# Patient Record
Sex: Female | Born: 1935 | Race: White | Hispanic: No | Smoking: Never smoker
Health system: Southern US, Community
[De-identification: ages and names within clinical notes are randomized; demographics above are authoritative.]

## PROBLEM LIST (undated history)

## (undated) DIAGNOSIS — I10 Essential (primary) hypertension: Secondary | ICD-10-CM

## (undated) DIAGNOSIS — E785 Hyperlipidemia, unspecified: Secondary | ICD-10-CM

## (undated) DIAGNOSIS — I2510 Atherosclerotic heart disease of native coronary artery without angina pectoris: Secondary | ICD-10-CM

## (undated) DIAGNOSIS — I480 Paroxysmal atrial fibrillation: Secondary | ICD-10-CM

## (undated) DIAGNOSIS — E039 Hypothyroidism, unspecified: Secondary | ICD-10-CM

## (undated) DIAGNOSIS — K219 Gastro-esophageal reflux disease without esophagitis: Secondary | ICD-10-CM

## (undated) DIAGNOSIS — Z9289 Personal history of other medical treatment: Secondary | ICD-10-CM

## (undated) DIAGNOSIS — K589 Irritable bowel syndrome without diarrhea: Secondary | ICD-10-CM

## (undated) DIAGNOSIS — K5790 Diverticulosis of intestine, part unspecified, without perforation or abscess without bleeding: Secondary | ICD-10-CM

## (undated) DIAGNOSIS — R0989 Other specified symptoms and signs involving the circulatory and respiratory systems: Secondary | ICD-10-CM

## (undated) HISTORY — DX: Essential (primary) hypertension: I10

## (undated) HISTORY — PX: BACK SURGERY: SHX140

## (undated) HISTORY — DX: Personal history of other medical treatment: Z92.89

## (undated) HISTORY — DX: Irritable bowel syndrome, unspecified: K58.9

## (undated) HISTORY — DX: Hyperlipidemia, unspecified: E78.5

## (undated) HISTORY — DX: Diverticulosis of intestine, part unspecified, without perforation or abscess without bleeding: K57.90

## (undated) HISTORY — PX: OTHER SURGICAL HISTORY: SHX169

## (undated) HISTORY — PX: RECTOCELE REPAIR: SHX761

## (undated) HISTORY — DX: Gastro-esophageal reflux disease without esophagitis: K21.9

## (undated) HISTORY — PX: CORONARY ANGIOPLASTY WITH STENT PLACEMENT: SHX49

## (undated) HISTORY — DX: Atherosclerotic heart disease of native coronary artery without angina pectoris: I25.10

## (undated) HISTORY — DX: Other specified symptoms and signs involving the circulatory and respiratory systems: R09.89

## (undated) HISTORY — DX: Hypothyroidism, unspecified: E03.9

---

## 1997-03-31 ENCOUNTER — Encounter: Payer: Self-pay | Admitting: Internal Medicine

## 1998-06-16 ENCOUNTER — Other Ambulatory Visit: Admission: RE | Admit: 1998-06-16 | Discharge: 1998-06-16 | Payer: Self-pay | Admitting: Obstetrics and Gynecology

## 2000-05-12 ENCOUNTER — Encounter: Admission: RE | Admit: 2000-05-12 | Discharge: 2000-08-10 | Payer: Self-pay | Admitting: Family Medicine

## 2000-05-21 ENCOUNTER — Encounter: Payer: Self-pay | Admitting: Family Medicine

## 2000-05-21 ENCOUNTER — Ambulatory Visit (HOSPITAL_COMMUNITY): Admission: RE | Admit: 2000-05-21 | Discharge: 2000-05-21 | Payer: Self-pay | Admitting: Family Medicine

## 2000-06-01 ENCOUNTER — Encounter: Payer: Self-pay | Admitting: Neurosurgery

## 2000-06-05 ENCOUNTER — Inpatient Hospital Stay (HOSPITAL_COMMUNITY): Admission: RE | Admit: 2000-06-05 | Discharge: 2000-06-06 | Payer: Self-pay | Admitting: Neurosurgery

## 2000-06-05 ENCOUNTER — Encounter: Payer: Self-pay | Admitting: Neurosurgery

## 2000-06-27 ENCOUNTER — Encounter: Admission: RE | Admit: 2000-06-27 | Discharge: 2000-06-27 | Payer: Self-pay | Admitting: Neurosurgery

## 2000-06-27 ENCOUNTER — Encounter: Payer: Self-pay | Admitting: Neurosurgery

## 2001-03-15 ENCOUNTER — Encounter (INDEPENDENT_AMBULATORY_CARE_PROVIDER_SITE_OTHER): Payer: Self-pay | Admitting: *Deleted

## 2001-03-15 ENCOUNTER — Encounter (INDEPENDENT_AMBULATORY_CARE_PROVIDER_SITE_OTHER): Payer: Self-pay | Admitting: Specialist

## 2001-03-15 ENCOUNTER — Observation Stay (HOSPITAL_COMMUNITY): Admission: RE | Admit: 2001-03-15 | Discharge: 2001-03-16 | Payer: Self-pay | Admitting: Obstetrics and Gynecology

## 2002-08-08 ENCOUNTER — Other Ambulatory Visit: Admission: RE | Admit: 2002-08-08 | Discharge: 2002-08-08 | Payer: Self-pay | Admitting: Obstetrics and Gynecology

## 2006-11-30 ENCOUNTER — Encounter (INDEPENDENT_AMBULATORY_CARE_PROVIDER_SITE_OTHER): Payer: Self-pay | Admitting: *Deleted

## 2006-11-30 ENCOUNTER — Ambulatory Visit: Payer: Self-pay | Admitting: Cardiology

## 2006-11-30 ENCOUNTER — Inpatient Hospital Stay (HOSPITAL_COMMUNITY): Admission: EM | Admit: 2006-11-30 | Discharge: 2006-12-01 | Payer: Self-pay | Admitting: Emergency Medicine

## 2006-12-01 ENCOUNTER — Encounter (INDEPENDENT_AMBULATORY_CARE_PROVIDER_SITE_OTHER): Payer: Self-pay | Admitting: *Deleted

## 2006-12-26 ENCOUNTER — Encounter (INDEPENDENT_AMBULATORY_CARE_PROVIDER_SITE_OTHER): Payer: Self-pay | Admitting: *Deleted

## 2007-08-07 ENCOUNTER — Ambulatory Visit: Payer: Self-pay | Admitting: Cardiology

## 2007-08-07 ENCOUNTER — Telehealth: Payer: Self-pay | Admitting: Internal Medicine

## 2007-08-07 ENCOUNTER — Ambulatory Visit: Payer: Self-pay | Admitting: Internal Medicine

## 2007-08-07 ENCOUNTER — Inpatient Hospital Stay (HOSPITAL_COMMUNITY): Admission: AD | Admit: 2007-08-07 | Discharge: 2007-08-09 | Payer: Self-pay | Admitting: Internal Medicine

## 2007-08-07 ENCOUNTER — Inpatient Hospital Stay (HOSPITAL_BASED_OUTPATIENT_CLINIC_OR_DEPARTMENT_OTHER): Admission: RE | Admit: 2007-08-07 | Discharge: 2007-08-07 | Payer: Self-pay | Admitting: Internal Medicine

## 2007-08-28 ENCOUNTER — Ambulatory Visit: Payer: Self-pay | Admitting: Internal Medicine

## 2007-08-29 ENCOUNTER — Ambulatory Visit: Payer: Self-pay | Admitting: Internal Medicine

## 2007-08-29 LAB — CONVERTED CEMR LAB
ALT: 15 units/L (ref 0–35)
AST: 26 units/L (ref 0–37)
Albumin: 4 g/dL (ref 3.5–5.2)
Alkaline Phosphatase: 108 units/L (ref 39–117)
BUN: 6 mg/dL (ref 6–23)
Bilirubin, Direct: 0.1 mg/dL (ref 0.0–0.3)
CO2: 30 meq/L (ref 19–32)
Calcium: 9.6 mg/dL (ref 8.4–10.5)
Chloride: 99 meq/L (ref 96–112)
Cholesterol: 202 mg/dL (ref 0–200)
Creatinine, Ser: 0.9 mg/dL (ref 0.4–1.2)
Direct LDL: 131.9 mg/dL
GFR calc Af Amer: 77 mL/min
GFR calc non Af Amer: 63 mL/min
Glucose, Bld: 98 mg/dL (ref 70–99)
HDL: 38.5 mg/dL — ABNORMAL LOW (ref >39.0)
Potassium: 4.6 meq/L (ref 3.5–5.1)
Sodium: 134 meq/L — ABNORMAL LOW (ref 135–145)
Total Bilirubin: 0.8 mg/dL (ref 0.3–1.2)
Total CHOL/HDL Ratio: 5.2
Total Protein: 7.8 g/dL (ref 6.0–8.3)
Triglycerides: 168 mg/dL — ABNORMAL HIGH (ref 0–149)
VLDL: 34 mg/dL (ref 0–40)

## 2007-09-03 ENCOUNTER — Encounter (INDEPENDENT_AMBULATORY_CARE_PROVIDER_SITE_OTHER): Payer: Self-pay | Admitting: *Deleted

## 2007-12-13 ENCOUNTER — Ambulatory Visit: Payer: Self-pay | Admitting: Internal Medicine

## 2007-12-13 LAB — CONVERTED CEMR LAB
ALT: 18 units/L (ref 0–35)
AST: 22 units/L (ref 0–37)
Albumin: 3.7 g/dL (ref 3.5–5.2)
Alkaline Phosphatase: 80 units/L (ref 39–117)
BUN: 8 mg/dL (ref 6–23)
Bilirubin, Direct: 0.1 mg/dL (ref 0.0–0.3)
CO2: 31 meq/L (ref 19–32)
Calcium: 9.1 mg/dL (ref 8.4–10.5)
Chloride: 97 meq/L (ref 96–112)
Cholesterol: 114 mg/dL (ref 0–200)
Creatinine, Ser: 0.7 mg/dL (ref 0.4–1.2)
GFR calc Af Amer: 102 mL/min
GFR calc non Af Amer: 85 mL/min
Glucose, Bld: 81 mg/dL (ref 70–99)
HDL: 38.6 mg/dL — ABNORMAL LOW (ref >39.0)
LDL Cholesterol: 46 mg/dL (ref 0–99)
Potassium: 4.2 meq/L (ref 3.5–5.1)
Sodium: 134 meq/L — ABNORMAL LOW (ref 135–145)
Total Bilirubin: 0.8 mg/dL (ref 0.3–1.2)
Total CHOL/HDL Ratio: 3
Total Protein: 6.9 g/dL (ref 6.0–8.3)
Triglycerides: 148 mg/dL (ref 0–149)
VLDL: 30 mg/dL (ref 0–40)

## 2007-12-20 ENCOUNTER — Ambulatory Visit: Payer: Self-pay | Admitting: Internal Medicine

## 2008-01-03 ENCOUNTER — Ambulatory Visit: Payer: Self-pay

## 2008-01-03 ENCOUNTER — Encounter: Payer: Self-pay | Admitting: Internal Medicine

## 2008-07-04 ENCOUNTER — Encounter: Payer: Self-pay | Admitting: Internal Medicine

## 2008-07-04 ENCOUNTER — Ambulatory Visit: Payer: Self-pay | Admitting: Internal Medicine

## 2008-07-04 DIAGNOSIS — E785 Hyperlipidemia, unspecified: Secondary | ICD-10-CM | POA: Insufficient documentation

## 2008-07-04 DIAGNOSIS — I2510 Atherosclerotic heart disease of native coronary artery without angina pectoris: Secondary | ICD-10-CM | POA: Insufficient documentation

## 2008-07-04 DIAGNOSIS — I10 Essential (primary) hypertension: Secondary | ICD-10-CM | POA: Insufficient documentation

## 2008-07-04 DIAGNOSIS — K589 Irritable bowel syndrome without diarrhea: Secondary | ICD-10-CM | POA: Insufficient documentation

## 2008-08-11 ENCOUNTER — Encounter: Payer: Self-pay | Admitting: Internal Medicine

## 2008-08-29 DIAGNOSIS — K5730 Diverticulosis of large intestine without perforation or abscess without bleeding: Secondary | ICD-10-CM | POA: Insufficient documentation

## 2008-08-29 DIAGNOSIS — K219 Gastro-esophageal reflux disease without esophagitis: Secondary | ICD-10-CM | POA: Insufficient documentation

## 2008-08-29 DIAGNOSIS — E039 Hypothyroidism, unspecified: Secondary | ICD-10-CM | POA: Insufficient documentation

## 2008-08-29 DIAGNOSIS — K649 Unspecified hemorrhoids: Secondary | ICD-10-CM | POA: Insufficient documentation

## 2008-09-03 ENCOUNTER — Ambulatory Visit: Payer: Self-pay | Admitting: Internal Medicine

## 2008-09-12 ENCOUNTER — Ambulatory Visit: Payer: Self-pay | Admitting: Internal Medicine

## 2009-02-05 ENCOUNTER — Encounter: Payer: Self-pay | Admitting: Internal Medicine

## 2009-03-19 ENCOUNTER — Ambulatory Visit: Payer: Self-pay | Admitting: Internal Medicine

## 2009-03-19 DIAGNOSIS — R0989 Other specified symptoms and signs involving the circulatory and respiratory systems: Secondary | ICD-10-CM | POA: Insufficient documentation

## 2009-08-10 ENCOUNTER — Encounter: Payer: Self-pay | Admitting: Internal Medicine

## 2010-02-10 ENCOUNTER — Ambulatory Visit: Payer: Self-pay | Admitting: Internal Medicine

## 2010-02-10 ENCOUNTER — Inpatient Hospital Stay (HOSPITAL_COMMUNITY): Admission: EM | Admit: 2010-02-10 | Discharge: 2010-02-12 | Payer: Self-pay | Admitting: Emergency Medicine

## 2010-02-18 ENCOUNTER — Encounter: Payer: Self-pay | Admitting: Internal Medicine

## 2010-02-26 ENCOUNTER — Encounter: Payer: Self-pay | Admitting: Internal Medicine

## 2010-03-01 ENCOUNTER — Ambulatory Visit: Payer: Self-pay | Admitting: Internal Medicine

## 2010-03-24 ENCOUNTER — Encounter: Payer: Self-pay | Admitting: Internal Medicine

## 2010-05-13 NOTE — Progress Notes (Signed)
Summary: CHEST PAIN//TRIAGE  Phone Note Call from Patient Call back at Home Phone [phone]   Caller: Patient DAUGHTER IN LAW [name] Call For: Aikam Vinje Reason for Call: Talk to Nurse Complaint: Chest Pain Summary of Call: PT WAKES UP IN THE MIDDLE OF NIGHT W/ CHEST PAINS...UNABLE TO SLEEP..A LOT OF INDIGESTION HAD A CARDIAC WORK UP AND ITS NOT HEART RELATED.Marland KitchenHAS WEIGHT LOSS./PT WANT TO SPEAK TO NURSE LAS TIME SHE SAW Aitanna Haubner WAS IN '98 IN [facility] ORDERED CHART Initial call taken by: Tawni Levy,  August 07, 2007 9:41 AM  Follow-up for Phone Call        Pt. c/o mid-chest pain 2-3 times a week. Worse at night, but now having symptoms upon exertion. Also c/o SOB, sweating,tingling and numbness down both arms and nausea during these episodes. Takes Protonix at bedtime and as needed. Was in hospital 11/2006 and didn't keep f/u appt. for stress test and ov in 9/08. Pt. states she doesn't have a cardiologist. I called [facility] and got pt. an appointment today at 11:30am with Dr.Hochrein. Pt. instructed to take her meds and insurance cards with her. Pt. instructed to call back as needed. ..................................................................Marland KitchenLaureen Ochs LPN  August 07, 2007 10:34 AM

## 2010-05-13 NOTE — Procedures (Signed)
Summary: Gastroenterology-COLON  Gastroenterology-COLON   Imported By: Hortense Ramal CMA 08/29/2008 17:40:42  _____________________________________________________________________  External Attachment:    Type:   Image     Comment:   External Document

## 2010-05-13 NOTE — Op Note (Signed)
Summary: Rectocele/Enteroocele Repair                    [facility]  Patient:    [name], [name] Visit Number: [id number] MRN: [id number]          Service Type: DSU Location: [id number] Attending Physician:  Miguel Aschoff Dictated by:   Miguel Aschoff, M.D. Proc. Date: 03/15/01 Admit Date:  03/15/2001                             Operative Report  PREOPERATIVE DIAGNOSIS:       Symptomatic rectocele and enterocele.  POSTOPERATIVE DIAGNOSIS:      Symptomatic rectocele and enterocele.  OPERATION:                    Repair of rectocele and enterocele.  SURGEON:                      Miguel Aschoff, M.D.  ASSISTANT:                    Brook A. Edward Jolly, M.D.  ANESTHESIA:                   General anesthesia.  COMPLICATIONS:                None.  ESTIMATED BLOOD LOSS:         70 cc.  INDICATIONS:                  The patient is a 77 year old white female status post abdominal hysterectomy in 1974 and four normal spontaneous vaginal deliveries.  The patient reports that on standing and straining, she notes protrusion through the introitus and on examination she is noted to have a mass bulging through the introitus which is consistent with a large rectocele above which exists also an enterocele. She presents now to have these defects in the pelvic floor repaired.  The risks and benefits of the procedure have been discussed with the patient.  DESCRIPTION OF PROCEDURE:     The patient was taken to the operating room and placed in the supine position and general anesthesia was administered without difficulty.  She was then placed in dorsal lithotomy position and prepped and draped in the usual sterile fashion.  The posterior vaginal wall and perineum were injected with dilute Neo-Synephrine solution.  Ellipse of skin was then incised in the perineum and elevated and then dissection of the posterior vaginal mucosa was carried up to the apex of the vaginal cuff.  At the  vaginal cuff, the remnants of the uterosacral ligaments were identified and tagged using figure-of-eight sutures of 0 Vicryl. These were then elevated.  Then the vaginal mucosa was separated from the perirectal tissue, then the large rectocele was identified and dissected out.  At the apex of the cuff, the enterocele was identified.  Peritoneum was then entered. The enterocele was reduced using pursestring suture of 0 Vicryl and the excess tissue from the enterocele sac was excised.  The rectocele was then reduced using serial pursestring sutures of 0 Vicryl suture.  Once it was reduced, it was reinforced using interrupted 0 Vicryl sutures.  The perirectal fascia was brought across the defect using interrupted 0 Vicryl sutures.  Once this was done, the levator muscles were brought together using interrupted 0 Vicryl sutures.  The excess vaginal mucosa  was trimmed. The uterosacral ligaments were then sutured to each other at the apex to support the cuff and the vaginal mucosa was closed using running interlocking 0 Vicryl suture.  The peritoneum was then reapproximated using running continuous 2-0 Vicryl suture and the perineal skin was closed using subcuticular 2-0 Vicryl suture. The estimated blood loss was approximately 70 cc.  The patient tolerated the procedure well and was taken to the recovery room in satisfactory condition. A vaginal pack of iodoform gauze was placed in the vagina prior to completion of the procedure as was the Foley catheter which drained clear urine. Dictated by:   Miguel Aschoff, M.D. Attending Physician:  Miguel Aschoff DD:  03/15/01 TD:  03/15/01 Job: [id number]

## 2010-05-13 NOTE — Assessment & Plan Note (Signed)
Summary: STOMACH PAIN AND DIARRHEA..EM    History of Present Illness Visit Type: Initial Visit Primary GI MD: Lina Sar MD Primary Provider: Juline Patch, MD Chief Complaint: diarrhea History of Present Illness:   This is an 77 year old white female with fecal urgency and incontinence which occurs several times a week. She has a history of moderate to severe diverticulosis of the left colon confirmed on a colonoscopy done in December 1998. She also had a rectocele and cystocele repair by Dr. Duane Lope in 2000. She had a contracted gallbladder on an ultrasound in August 2008 with her common bile duct measuring at 7 mm. The gallbladder was actually never visualized but was presumed to be contracted. She has many cardiovascular problems including high blood pressure and carotid bruits. She has hyperlipidemia and coronary artery disease for which she is status post PTCA in April 2009. She is on Plavix.   GI Review of Systems    Reports abdominal pain.     Location of  Abdominal pain: lower abdomen.    Denies acid reflux, belching, bloating, chest pain, dysphagia with liquids, dysphagia with solids, heartburn, loss of appetite, nausea, vomiting, vomiting blood, weight loss, and  weight gain.      Reports diarrhea, diverticulosis, and  hemorrhoids.     Denies anal fissure, black tarry stools, change in bowel habit, constipation, fecal incontinence, heme positive stool, irritable bowel syndrome, jaundice, light color stool, liver problems, rectal bleeding, and  rectal pain.    Current Medications (verified): 1)  Multivitamins   Tabs (Multiple Vitamin) .... Once Daily 2)  Vitamin B-12 500 Mcg  Tabs (Cyanocobalamin) .Marland Kitchen.. 1 Once Daily 3)  Vitamin C 500 Mg  Tabs (Ascorbic Acid) .Marland Kitchen.. 1 Once Daily 4)  Calcium 600/vitamin D 600-400 Mg-Unit Tabs (Calcium Carbonate-Vitamin D) .Marland Kitchen.. 1 Once Daily 5)  Vitamin E 600 Unit  Caps (Vitamin E) .Marland Kitchen.. 1 Once Daily 6)  Prevacid 15 Mg Cpdr (Lansoprazole) .Marland Kitchen.. 1 Once  Daily 7)  Levoxyl 100 Mcg Tabs (Levothyroxine Sodium) .Marland Kitchen.. 1 Once Daily 8)  Amlodipine Besylate 5 Mg Tabs (Amlodipine Besylate) .... Take One Tablet By Mouth Daily 9)  Simvastatin 40 Mg Tabs (Simvastatin) .... Take One Tablet By Mouth Daily At Bedtime 10)  Metoprolol Tartrate 100 Mg Tabs (Metoprolol Tartrate) .... Take One Tablet By Mouth Twice A Day 11)  Aspirin 81 Mg Tbec (Aspirin) .... Take One Tablet By Mouth Daily 12)  Plavix 75 Mg Tabs (Clopidogrel Bisulfate) .... Take One Tablet By Mouth Daily 13)  Align  Caps (Misc Intestinal Flora Regulat) .... Once Daily  Allergies (verified): No Known Drug Allergies  Past History:  Past Medical History: Reviewed history from 08/29/2008 and no changes required.  1. CAD      a, s/p Promus DES to LAD 4/09. moderate CAD elsewhere. EF 75%  2. Severe hypertension.   2. Hypothyroidism.   3. Diverticulosis with history of diverticulitis.   4. Irritable bowel  DIVERTICULOSIS, COLON (ICD-562.10) HEMORRHOIDS (ICD-455.6) HYPOTHYROIDISM (ICD-244.9) GERD (ICD-530.81) IRRITABLE BOWEL SYNDROME (ICD-564.1) HYPERLIPIDEMIA (ICD-272.4) HYPERTENSION, BENIGN (ICD-401.1) CAD, NATIVE VESSEL (ICD-414.01)       Past Surgical History: Reviewed history from 08/29/2008 and no changes required. Back Surgery Hysterectomy Rectocele/Enterocele Repair  Family History: Reviewed history from 08/29/2008 and no changes required. Her mother died at age 65 of complications of myasthenia gravis.  Her father died at age 9 of complications from a hip fracture from a fall. No FH of Colon Cancer: Family History of Heart Disease: Uncles  Social History:  Reviewed history from 08/29/2008 and no changes required. She is widowed with 4 children.   Occupation: Retired Engineer, site Alcohol Use - no Illicit Drug Use - no Patient does not get regular exercise.   Review of Systems       Pertinent positive and negative review of systems were noted in the above HPI.  All other ROS was otherwise negative.   Vital Signs:  Patient profile:   77 year old female Height:      65 inches Weight:      148 pounds Pulse rate:   64 / minute Pulse rhythm:   regular BP sitting:   142 / 76  (left arm) Cuff size:   regular  Vitals Entered By: June McMurray CMA (Sep 03, 2008 1:26 PM)  Physical Exam  General:  Appears younger than her stated age. Eyes:  PERRLA, no icterus. Mouth:  No deformity or lesions, dentition normal. Lungs:  Clear throughout to auscultation. Heart:  Regular rate and rhythm; no murmurs, rubs or bruits. Abdomen:  soft nontender with normoactive bowel sounds. No distention. No bruit. Liver edge at costal margin. There is no palpable mass. Rectal:  normal rectal tone with decreased  squeeze. There is a small amount of hemoccult negative stool in the rectal ampulla. Neurologic:  Alert and oriented x4; grossly normal neurologically.   Impression & Recommendations:  Problem # 1:  DIVERTICULOSIS, COLON (ICD-562.10)  moderately severe diverticulosis of the left colon most likely causing patient's symptoms of urgency due to partial narrowing of the lumen. Her last colonoscopy was in 1998. At this time, we need to rule out any obstructing lesion. She had repair of a rectocele and cystocele with possible recurrence . She also has a decreased rectal tone causing patient's urgency and accidents. We will try her on fiber supplements including Metamucil 2 teaspoons daily and Levbid 0.375 mg q.a.m. as an antispasmodic. She takes Imodium p.r.n. We will proceed with a colonoscopy for further evaluation of the left colon.  Orders: Colonoscopy (Colon)  Problem # 2:  HEMORRHOIDS (ICD-455.6)  There were no noticeable hemorrhoids on today's exam.  Orders: Colonoscopy (Colon)  Problem # 3:  IRRITABLE BOWEL SYNDROME (ICD-564.1)  fecal urgency and incontinence may be of partly related to irritable bowel syndrome. She will undergo evaluation with a  colonoscopy.  Orders: Colonoscopy (Colon)  Patient Instructions: 1)  Metamucil 2 teaspoons p.o. q.d. 2)  Schedule colonoscopy on Plavix.  3)  Patient is due to see Dr. Duane Lope 4)  Copy sent to : Dr R.Pang Prescriptions: LEVBID 0.375 MG XR12H-TAB (HYOSCYAMINE SULFATE) Take 1 tablet by mouth every morning.  #30 x 1   Entered by:   Hortense Ramal CMA   Authorized by:   Hart Carwin   Signed by:   Hortense Ramal CMA on 09/03/2008   Method used:   Electronically to        [facility] [phone]* (retail)       [address] [place]  [address]       Ph: [phone] or [phone]       Fax: [phone]   RxID:   [id number] DULCOLAX 5 MG  TBEC (BISACODYL) Day before procedure take 2 at 3pm and 2 at 8pm.  #4 x 0   Entered by:   Hortense Ramal CMA   Authorized by:   Hart Carwin   Signed by:   Hortense Ramal CMA on 09/03/2008  Method used:   [facility]ronically to        [facility] [phone]* (retail)       [address] [place]  [address]       Ph: [phone] or [phone]       Fax: [phone]   RxID:   [id number] REGLAN 10 MG  TABS (METOCLOPRAMIDE HCL) As per prep instructions.  #2 x 0   Entered by:   Hortense Ramal CMA   Authorized by:   Hart Carwin   Signed by:   Hortense Ramal CMA on 09/03/2008   Method used:   Electronically to        [facility] [phone]* (retail)       [address] [place]  [address]       Ph: [phone] or [phone]       Fax: [phone]   RxID:   [id number] MIRALAX   POWD (POLYETHYLENE GLYCOL 3350) As per prep  instructions.  #255 grams x 0   Entered by:   Hortense Ramal CMA   Authorized by:   Hart Carwin   Signed by:   Hortense Ramal CMA on 09/03/2008   Method used:   Electronically to        [facility] [phone]* (retail)       [address]       [place], [place]  [address]       Ph: [phone] or [phone]       Fax: [phone]   RxID:   [id number]

## 2010-05-13 NOTE — Letter (Signed)
Summary: Cardiac Rehab Program  Cardiac Rehab Program   Imported By: Marylou Mccoy 03/17/2010 11:34:49  _____________________________________________________________________  External Attachment:    Type:   Image     Comment:   External Document

## 2010-05-13 NOTE — Discharge Summary (Signed)
Summary: Chest Pain  NAME:  [name], [name]                ACCOUNT NO.:  [id number]      MEDICAL RECORD NO.:  [id number]          PATIENT TYPE:  INP      LOCATION:  6526                         FACILITY:  [facility]      PHYSICIAN:  Arturo Morton. Riley Kill, MD, FACCDATE OF BIRTH:  1921-03-09      DATE OF ADMISSION:  08/07/2007   DATE OF DISCHARGE:  08/09/2007                                  DISCHARGE SUMMARY      PRIMARY CARDIOLOGIST:  [name]. Bensimhon, MD      PRIMARY CARE PHYSICIAN:  Not listed.      GASTROENTEROLOGIST:  Hedwig Morton. Juanda Chance, MD.      PROCEDURES PERFORMED DURING HOSPITALIZATION:  Cardiac catheterization.   A.  Three-vessel coronary artery disease with high-grade thrombotic   lesion in the left anterior descending artery, otherwise nonobstructive   coronary artery disease, hyperdynamic left ventricular function.   B.  Status post percutaneous coronary intervention to the left anterior   descending using a 2.5- x 80-mm Palmaz drug-eluting stent per Dr.   Riley Kill on August 07, 2007.      FINAL DISCHARGE DIAGNOSES:   1. Coronary artery disease.   1A.  Status post cardiac catheterization with percutaneous coronary   intervention to the left anterior descending using a Palmaz drug-eluting   stent with other nonobstructive disease noted and found ruptured plaque   in the mid left anterior descending with a septal occlusion.   1. Hypertension.   2. Gastroesophageal reflux disease.   3. Irritable bowel syndrome.   4. Hypothyroidism.      HOSPITAL COURSE:  This is an 77 year old female with a history of   hypertension and GERD with complaints of chest discomfort, which she   also had associated shortness of breath.  The pain was substernal   lasting for about 10-15 minutes and resolved with rest.  She also   reported some nighttime episodes, as a result of this the patient was   admitted for cardiac catheterization.      The patient did undergo cardiac catheterization as described  above and   did have PCI to the mid LAD with a drug-eluting stent by Dr. Riley Kill.   She also had moderate coronary artery disease in the other vessels and   will be treated medically for that.  The patient tolerated the procedure   well without any further complaints of chest discomfort.      Labs were found to be stable.  There were no elevations to cardiac   enzymes.  The patient was placed on Pepcid along with Plavix on   discharge and aspirin.  The patient will follow up with Dr. Gala Romney in   the office as an outpatient for continued cardiac care.      DISCHARGE LABORATORY:  Bilirubin 0.8, direct bilirubin 0.1, alkaline   phosphatase 108, SGOT 26, total protein 7.8, albumin 4.0, ALT 15, sodium   134, potassium 4.6, chloride 99, CO2 of 30, glucose 98, BUN 6,   creatinine 0.9, cholesterol 202, lipids 168, LDL 38.5,  hemoglobin 12.1,   hematocrit 35.8, white blood cells 8.1, platelets 217,000.      DISCHARGE MEDICATIONS:   1. Pepcid 20 mg p.o. daily.   2. Synthroid 100 mcg daily.   3. Metoprolol 100 mg b.i.d.,   4. Amlodipine 5 mg daily.   5. Plavix 75 mg daily.   6. Aspirin 325 mg daily.      ALLERGIES:  No known drug allergies.      FOLLOWUP PLANS AND APPOINTMENTS:   1. The patient will follow up with Dr. Arvilla Meres on Aug 28, 2007 for continuation of cardiac evaluation.   2. The patient will follow up with her primary care physician for       continuation of medical management.   3. The patient was given post cardiac catheterization instructions       with particular evidences on the right groin site for evidence of       bleeding hematoma or signs of infection.   4. The patient has been advised to bring medications to followup       appointment.      Time spent with the patient to include physician time 35 minutes.               Bettey Mare. Lyman Bishop, NP               Arturo Morton. Riley Kill, MD, [facility]   Electronically Signed         KML/MEDQ  D:   09/03/2007  T:  09/03/2007  Job:  [id number]      cc:   Juline Patch, M.D.

## 2010-05-13 NOTE — Assessment & Plan Note (Signed)
Summary: [facility]   Visit Type:  Follow-up Primary Provider:  Ricki Miller  CC:  shortness of breath.  History of Present Illness: 77 y/o with h/o CAD with history of Promus DES to LAD, HTN  and IBS.  Doing well from cardiac perspective. No CP. Does have mild dyspnea. Gets out of breath with moderate activity. Still bowling 1x week. No orthopnea, PND or edema. Takes BP regularly at home typically 120/70.  Main complaint continues to be with her stomach with indigestion and frequent diarrhea. No nausea/vomiting.   Current Medications (verified): 1)  Multivitamins   Tabs (Multiple Vitamin) .... Once Daily 2)  Fish Oil   Oil (Fish Oil) .... Once Daily 3)  Vitamin B-12 500 Mcg  Tabs (Cyanocobalamin) .Marland Kitchen.. 1 Once Daily 4)  Vitamin C 500 Mg  Tabs (Ascorbic Acid) .Marland Kitchen.. 1 Once Daily 5)  Calcium 600/vitamin D 600-400 Mg-Unit Tabs (Calcium Carbonate-Vitamin D) .Marland Kitchen.. 1 Once Daily 6)  Vitamin E 600 Unit  Caps (Vitamin E) .Marland Kitchen.. 1 Once Daily 7)  Prevacid 15 Mg Cpdr (Lansoprazole) .Marland Kitchen.. 1 Once Daily 8)  Levoxyl 100 Mcg Tabs (Levothyroxine Sodium) .Marland Kitchen.. 1 Once Daily 9)  Amlodipine Besylate 5 Mg Tabs (Amlodipine Besylate) .... Take One Tablet By Mouth Daily 10)  Simvastatin 40 Mg Tabs (Simvastatin) .... Take One Tablet By Mouth Daily At Bedtime 11)  Metoprolol Tartrate 100 Mg Tabs (Metoprolol Tartrate) .... Take One Tablet By Mouth Twice A Day 12)  Aspirin 81 Mg Tbec (Aspirin) .... Take One Tablet By Mouth Daily 13)  Plavix 75 Mg Tabs (Clopidogrel Bisulfate) .... Take One Tablet By Mouth Daily  Allergies (verified): No Known Drug Allergies  Past History:  Past Medical History:     1. CAD         a, s/p Promus DES to LAD 4/09. moderate CAD elsewhere. EF 75%     2. Severe hypertension.      2. Hypothyroidism.      3. Diverticulosis with history of diverticulitis.      4. Irritable bowel            (07/03/2008)  Family History:    Reviewed history from 07/03/2008 and no changes required:  Her mother died at age 69 of complications of myasthenia       gravis.  Her father died at age 60 of complications from a hip fracture from       a fall.  Social History:    Reviewed history from 07/03/2008 and no changes required:       She is widowed with 4 children.  No tobacco or alcohol         use.            Review of Systems       As per HPI and past medical history; otherwise all systems negative.   Vital Signs:  Patient profile:   77 year old female Height:      65 inches Weight:      150 pounds Pulse rate:   63 / minute BP sitting:   126 / 80  (left arm)  Vitals Entered By: Laurance Flatten CMA (July 04, 2008 11:31 AM)  Physical Exam  General:  Gen: well appearing. eldelry no resp difficulty HEENT: normal Neck: supple. no JVD. Carotids 2+ bilat; not bruits. No lymphadenopathy or thryomegaly appreciated. Cor: PMI nondisplaced. Regular rate & rhythm. No rubs, gallops, murmur. Lungs: clear Abdomen: soft, nontender, nondistended. No hepatosplenomegaly. No bruits or masses. Good bowel sounds.  Extremities: no cyanosis, clubbing, rash, edema Neuro: alert & orientedx3, cranial nerves grossly intact. moves all 4 extremities w/o difficulty. affect pleasant    Impression & Recommendations:  Problem # 1:  CAD, NATIVE VESSEL (ICD-414.01) Stable. No evidence of ischemia. Continue current medications. Lifelong plavix as toelrated.  Problem # 2:  HYPERLIPIDEMIA (ICD-272.4) Follwed by Dr. Ricki Miller. Goal LDL < 70. continue statin.  Problem # 3:  HYPERTENSION, BENIGN (ICD-401.1) Well controlled.  Problem # 4:  IRRITABLE BOWEL SYNDROME (ICD-564.1) Follow-up with Dr. Lina Sar.  Patient Instructions: 1)  Return to clinic in 9 months.

## 2010-05-13 NOTE — Miscellaneous (Signed)
Summary: [facility] Progress Report   [facility]   Imported By: Roderic Ovens 04/06/2010 16:30:32  _____________________________________________________________________  External Attachment:    Type:   Image     Comment:   External Document

## 2010-05-13 NOTE — Procedures (Signed)
Summary: Colonoscoopy   Colonoscopy  Procedure date:  09/12/2008  Findings:      Location:  [facility].    COLONOSCOPY PROCEDURE REPORT  PATIENT:  [name], [name]  MR#:  [id number] BIRTHDATE:   05-06-1920, 87 yrs. old   GENDER:   female  ENDOSCOPIST:   Hedwig Morton. Juanda Chance, MD Referred by: Juline Patch, M.D.  PROCEDURE DATE:  09/12/2008 PROCEDURE:  Colonoscopy, diagnostic ASA CLASS:   Class III INDICATIONS: unexplained diarrhea, fecal incontinence fecal urgency and incontinence several times a week  colonoscopy 1998 - severe diverticulosis  MEDICATIONS:    Versed 7 mg, Fentanyl 75 mcg  DESCRIPTION OF PROCEDURE:   After the risks benefits and alternatives of the procedure were thoroughly explained, informed consent was obtained.  Digital rectal exam was performed and revealed no abnormalities.   The LB [id number] endoscope was introduced through the anus and advanced to the cecum, which was identified by both the appendix and ileocecal valve, without limitations.  The quality of the prep was good, using MiraLax.  The instrument was then slowly withdrawn as the colon was fully examined. <<PROCEDUREIMAGES>>                <<OLD IMAGES>>  FINDINGS:  Severe diverticulosis was found in the descending colon (see image1, image5, image6, and image7). very narrow and tortuous colon, difficult to navigate, hypertrophied folds  Internal hemorrhoids were found (see image8).  This was otherwise a normal examination of the colon (see image2, image3, and image4).   Retroflexed views in the rectum revealed no abnormalities.    The scope was then withdrawn from the patient and the procedure completed.  COMPLICATIONS:   None  ENDOSCOPIC IMPRESSION:  1) Severe diverticulosis in the descending colon  2) Internal hemorrhoids  3) Otherwise normal examination  partial cplon obstruction due to severe diverticulosis RECOMMENDATIONS:  1) high fiber diet  pt'symptoms of  incontinence likely related to severe diverticulosis  REPEAT EXAM:   In 0 year(s) for.   _______________________________ Hedwig Morton. Juanda Chance, MD  CC:

## 2010-05-13 NOTE — Assessment & Plan Note (Signed)
Summary: f71m  Medications Added OMEPRAZOLE 20 MG CPDR (OMEPRAZOLE) once daily ([facility])      Allergies Added: NKDA  Primary Provider:  Juline Patch, MD  CC:  SOB with walking.  History of Present Illness: 77 y/o with h/o severe HTN, IBS and CAD with history of Promus DES to LAD in 4/09.  Returns for routine follow-up. Doing well from cardiac perspective. No CP. Continues to have mild dyspnea with walking. Still bowling 1x week. No orthopnea, PND or edema. Takes BP regularly at home typically 120/70. No problems with medicines.  Still having problems with chronic diarrhea. Less constipation.   Current Medications (verified): 1)  Multivitamins   Tabs (Multiple Vitamin) .... Once Daily 2)  Vitamin B-12 500 Mcg  Tabs (Cyanocobalamin) .Marland Kitchen.. 1 Once Daily 3)  Vitamin C 500 Mg  Tabs (Ascorbic Acid) .Marland Kitchen.. 1 Once Daily 4)  Calcium 600/vitamin D 600-400 Mg-Unit Tabs (Calcium Carbonate-Vitamin D) .Marland Kitchen.. 1 Once Daily 5)  Vitamin E 600 Unit  Caps (Vitamin E) .Marland Kitchen.. 1 Once Daily 6)  Omeprazole 20 Mg Cpdr (Omeprazole) .... Once Daily ([facility]) 7)  Levoxyl 100 Mcg Tabs (Levothyroxine Sodium) .Marland Kitchen.. 1 Once Daily 8)  Amlodipine Besylate 5 Mg Tabs (Amlodipine Besylate) .... Take One Tablet By Mouth Daily 9)  Simvastatin 40 Mg Tabs (Simvastatin) .... Take One Tablet By Mouth Daily At Bedtime 10)  Metoprolol Tartrate 100 Mg Tabs (Metoprolol Tartrate) .... Take One Tablet By Mouth Twice A Day 11)  Aspirin 81 Mg Tbec (Aspirin) .... Take One Tablet By Mouth Daily 12)  Plavix 75 Mg Tabs (Clopidogrel Bisulfate) .... Take One Tablet By Mouth Daily  Allergies (verified): No Known Drug Allergies  Past History:  Past Medical History:  1. CAD      a, s/p Promus DES to LAD 4/09. moderate CAD elsewhere. EF 75%  2. Severe hypertension.   2. Hypothyroidism.   3. Diverticulosis with history of diverticulitis.   4. Irritable bowel syndrome  5. Carotid bruit followed by Dr. Ricki Miller  DIVERTICULOSIS, COLON  (ICD-562.10) HEMORRHOIDS (ICD-455.6) HYPOTHYROIDISM (ICD-244.9) GERD (ICD-530.81) IRRITABLE BOWEL SYNDROME (ICD-564.1) HYPERLIPIDEMIA (ICD-272.4)        Review of Systems       As per HPI and past medical history; otherwise all systems negative.   Vital Signs:  Patient profile:   77 year old female Height:      65 inches Weight:      146 pounds Pulse rate:   62 / minute BP sitting:   144 / 70  (right arm) Cuff size:   regular  Vitals Entered By: Hardin Negus, RMA (March 19, 2009 11:43 AM)  Physical Exam  General:  Appears younger than her stated age. Gen: well appearing. no resp difficulty HEENT: normal Neck: supple. no JVD. Carotids 2+ bilat; soft L bruit. Prominent carotid pulsation on R. Cor: PMI nondisplaced. Regular rate & rhythm. No rubs, gallops, murmur. Lungs: clear Abdomen: soft, nontender, nondistended. No hepatosplenomegaly. No bruits or masses. Good bowel sounds. Extremities: no cyanosis, clubbing, rash, edema Neuro: alert & orientedx3, cranial nerves grossly intact. moves all 4 extremities w/o difficulty. affect pleasant    Impression & Recommendations:  Problem # 1:  CAD, NATIVE VESSEL (ICD-414.01) Stable. No evidence of ischemia. Continue current regimen.  Problem # 2:  HYPERTENSION, BENIGN (ICD-401.1) BP mildly elevated here but looks good at home. Encouraged her to continue to follow BP at home and if stays over 140 can f/u with Dr. Ricki Miller and increase amlodipine to 10 once daily  Problem # 3:  HYPERLIPIDEMIA (ICD-272.4) Followed by Dr. Ricki Miller. Goal LDL < 70. Continue statin.  Problem # 4:  CAROTID BRUIT (ICD-785.9) Followed by Dr. Ricki Miller.  Other Orders: EKG w/ Interpretation (93000)  Patient Instructions: 1)  Follow up in 9 months

## 2010-05-13 NOTE — Discharge Summary (Signed)
Summary: Chest Pain  NAME:  [name], [name]                ACCOUNT NO.:  [id number]      MEDICAL RECORD NO.:  [id number]          PATIENT TYPE:  INP      LOCATION:  3735                         FACILITY:  [facility]      PHYSICIAN:  Michaelyn Barter, M.D. DATE OF BIRTH:  1920/11/15      DATE OF ADMISSION:  11/30/2006   DATE OF DISCHARGE:  12/01/2006                                  DISCHARGE SUMMARY      FINAL DIAGNOSES:   1. Chest pain.   2. Probable pericarditis.   3. Questionable bowel incontinence.   4. Hypertension.      CONSULTATION:  [facility] with Dr. Juanito Doom.      PROCEDURES.:   1. Portable chest x-ray completed November 30, 2006.   2. Portable abdominal x-ray completed November 30, 2006.   3. Abdominal ultrasound completed December 01, 2006      HISTORY OF PRESENT ILLNESS:  Ms. Cashaw is an 77 year old female who   indicated that she had experienced chest pain off and on for 3 days   leading up to this admission. Her pain was described as being substernal   with radiation across her chest.  She went to see her primary care   doctor 2 days prior to that admission and was started on Prevacid;   however, the pain persisted.  She indicated that her symptoms improved   when she sat up, and she did not feel that there was any relationship to   food.      PAST MEDICAL HISTORY:  Please see that dictated by Dr. Ladell Pier.      HOSPITAL COURSE:   #1.  CHEST PAIN:  The patient's cardiac markers were cycled during the   course of her hospitalization. Her troponin I were found to be 0.05,   0.04, and 0.04.  Her CK-MBs were found to be 1.6, 1.6, and 2.1.   Therefore, she ruled out for a myocardial infarct.  A D-dimer was found   to be less than 0.22, decreasing the likelihood that she had a PE.  A   portable chest x-ray was done on November 30, 2006, which revealed no   acute findings.  Cardiology was consulted. Dr. Olga Millers   actually initially saw the patient.  His impression was that her EKG did   not have any acute EKG changes and that her chest pain appeared to be   atypical. He also stated that treatment for musculoskeletal pain should   commence, and there appeared to have been some consideration that the   patient's history was consistent with pericarditis per Dr. Vern Claude   assessment on August 22. By the day of discharge, the patient indicated   that she felt better, although she did have some slight positional chest   discomfort.      #2.  QUESTIONABLE BOWEL INCONTINENCE:  The patient did not complained of   any ongoing issues with regards to her bowels in the course of this   hospitalization.  Therefore, the decision was made  that she could follow   up with her primary care doctor once she was discharged from the   hospital. An ultrasound of the patient's abdomen was completed on August   22. The gallbladder could not be visualized despite appropriate period   of fasting. There was no evidence for wall echo shadow sign to suggest   the gallbladder contracted around stones.  There was no intrahepatic or   extrahepatic biliary dilatation seen. On the day of discharge, the   patient indicated that she felt better.  Her vital signs showed   temperature  97.8, heart rate 61, respirations 20, blood pressure   184/68, O2 saturation 100% on 2 liters.      #3.  HYPERTENSION:  The patient's blood pressure was not ideally   controlled, although attempts were made to do so. Decision was made to   discharge the patient from the hospital. Prior to her discharge, she did   have a 2-D echocardiogram completed on December 01, 2006, which revealed   overall left ventricular systolic function to be normal. Left   ventricular EF was estimated to be 60%.  There was no diagnostic   evidence of left ventricular regional wall motion abnormalities. The   patient was discharged from the hospital.      DISCHARGE MEDICATIONS:   1. Norvasc 5 mg p.o. daily.   2.  Advil 1 mg p.o. daily.   3. Metoprolol 100 mg p.o. b.i.d.   4. Benicar 40 mg p.o. daily.      She was told to keep a blood pressure diary, and she was scheduled for   an adenosine Myoview on December 14, 2006, at 12:30 with [facility].  She was told to follow up with Dr. Jens Som on January 03, 2007, at 12:30 and to call Dr. Ricki Miller for a followup appointment.               Michaelyn Barter, M.D.   Electronically Signed            OR/MEDQ  D:  12/26/2006  T:  12/26/2006  Job:  [id number]

## 2010-05-13 NOTE — Assessment & Plan Note (Signed)
Summary: f65m  Medications Added METOPROLOL TARTRATE 25 MG TABS (METOPROLOL TARTRATE) Take one tablet by mouth twice a day ASPIRIN EC 325 MG TBEC (ASPIRIN) Take one tablet by mouth daily NITROSTAT 0.4 MG SUBL (NITROGLYCERIN) 1 tablet under tongue at onset of chest pain; you may repeat every 5 minutes for up to 3 doses. ENALAPRIL MALEATE 5 MG TABS (ENALAPRIL MALEATE) Take one tablet by mouth twice a day      Allergies Added: NKDA  Visit Type:  Follow-up Primary Provider:  Juline Patch, MD  CC:  no complaints.  History of Present Illness: 77 y/o with h/o severe HTN, IBS and CAD with history of Promus DES to LAD in 4/09.  Recently admitted for [place]. Underwent cath -revealing a 95% stenosis in the distal RCA.  70-80% stenosis in a small left circumflex and 70-80% stenosis in the mid LAD.  The RCA was stented with a 3.0 x 150 mm Promus drug-eluting stent.  EF is 70%.  Returns for post-cath f/u. Doing great. Shortness of breath improved. No CP.  No problem with wrist site. BP well controlled.    Current Medications (verified): 1)  Multivitamins   Tabs (Multiple Vitamin) .... Once Daily 2)  Vitamin B-12 500 Mcg  Tabs (Cyanocobalamin) .Marland Kitchen.. 1 Once Daily 3)  Calcium 600/vitamin D 600-400 Mg-Unit Tabs (Calcium Carbonate-Vitamin D) .Marland Kitchen.. 1 Once Daily 4)  Omeprazole 20 Mg Cpdr (Omeprazole) .... Once Daily ([facility]) 5)  Levoxyl 100 Mcg Tabs (Levothyroxine Sodium) .Marland Kitchen.. 1 Once Daily 6)  Amlodipine Besylate 5 Mg Tabs (Amlodipine Besylate) .... Take One Tablet By Mouth Daily 7)  Simvastatin 40 Mg Tabs (Simvastatin) .... Take One Tablet By Mouth Daily At Bedtime 8)  Metoprolol Tartrate 25 Mg Tabs (Metoprolol Tartrate) .... Take One Tablet By Mouth Twice A Day 9)  Aspirin Ec 325 Mg Tbec (Aspirin) .... Take One Tablet By Mouth Daily 10)  Plavix 75 Mg Tabs (Clopidogrel Bisulfate) .... Take One Tablet By Mouth Daily 11)  Nitrostat 0.4 Mg Subl (Nitroglycerin) .Marland Kitchen.. 1 Tablet Under Tongue At Onset of Chest Pain;  You May Repeat Every 5 Minutes For Up To 3 Doses. 12)  Enalapril Maleate 5 Mg Tabs (Enalapril Maleate) .... Take One Tablet By Mouth Twice A Day  Allergies (verified): No Known Drug Allergies  Past History:  Past Medical History:  1. CAD      a. s/p Promus DES to LAD 4/09. moderate CAD elsewhere. EF 75%     b. s/p DES to RCA 11/11  2. Severe hypertension.   2. Hypothyroidism.   3. Diverticulosis with history of diverticulitis.   4. Irritable bowel syndrome  5. Carotid bruit followed by Dr. Ricki Miller  DIVERTICULOSIS, COLON (ICD-562.10) HEMORRHOIDS (ICD-455.6) HYPOTHYROIDISM (ICD-244.9) GERD (ICD-530.81) IRRITABLE BOWEL SYNDROME (ICD-564.1) HYPERLIPIDEMIA (ICD-272.4)        Vital Signs:  Patient profile:   77 year old female Height:      65 inches Weight:      152 pounds BMI:     25.39 Pulse rate:   67 / minute BP sitting:   124 / 68  (left arm) Cuff size:   regular  Vitals Entered By: Hardin Negus, RMA (March 01, 2010 10:15 AM)  Physical Exam  General:  Appears younger than her stated age. Gen: well appearing. no resp difficulty HEENT: normal Neck: supple. no JVD. Carotids 2+ bilat; soft L bruit.  Cor: PMI nondisplaced. Regular rate & rhythm. No rubs, gallops, murmur. Lungs: clear Abdomen: soft, nontender, nondistended. No hepatosplenomegaly. No bruits  or masses. Good bowel sounds. Extremities: no cyanosis, clubbing, rash, edema  R wrist site no bruit Neuro: alert & orientedx3, cranial nerves grossly intact. moves all 4 extremities w/o difficulty. affect pleasant    Impression & Recommendations:  Problem # 1:  CAD, NATIVE VESSEL (ICD-414.01) Doing well s/p PCI. Still has signifcant residual CAD but asymptomatic. Will continue to follow closely. Continue aggreessive RF management with ASA, statin, Plavix.  Problem # 2:  HYPERTENSION, BENIGN (ICD-401.1) Blood pressure well controlled. Continue current regimen.  Problem # 3:  HYPERLIPIDEMIA  (ICD-272.4) Followed by PCP. Continue simva. Goal LDL < 70.   Other Orders: EKG w/ Interpretation (93000)  Patient Instructions: 1)  Your physician wants you to follow-up in:  6 months.  You will receive a reminder letter in the mail two months in advance. If you don't receive a letter, please call our office to schedule the follow-up appointment.

## 2010-05-13 NOTE — Miscellaneous (Signed)
Summary: [place] Physician Order/Treatment Plan   [facility] Physician Order/Treatment Plan   Imported By: Roderic Ovens 03/18/2010 10:21:27  _____________________________________________________________________  External Attachment:    Type:   Image     Comment:   External Document

## 2010-06-22 LAB — URINE CULTURE
Colony Count: 10000
Culture  Setup Time: 201111021733

## 2010-06-22 LAB — CBC
HCT: 36 % (ref 36.0–46.0)
HCT: 39 % (ref 36.0–46.0)
HCT: 40.5 % (ref 36.0–46.0)
Hemoglobin: 12 g/dL (ref 12.0–15.0)
Hemoglobin: 13.1 g/dL (ref 12.0–15.0)
Hemoglobin: 13.5 g/dL (ref 12.0–15.0)
MCH: 30.8 pg (ref 26.0–34.0)
MCH: 30.9 pg (ref 26.0–34.0)
MCH: 31.1 pg (ref 26.0–34.0)
MCHC: 33.3 g/dL (ref 30.0–36.0)
MCHC: 33.3 g/dL (ref 30.0–36.0)
MCHC: 33.6 g/dL (ref 30.0–36.0)
MCV: 92.5 fL (ref 78.0–100.0)
MCV: 92.6 fL (ref 78.0–100.0)
MCV: 92.8 fL (ref 78.0–100.0)
Platelets: 194 10*3/uL (ref 150–400)
Platelets: 227 10*3/uL (ref 150–400)
Platelets: 236 10*3/uL (ref 150–400)
RBC: 3.88 MIL/uL (ref 3.87–5.11)
RBC: 4.21 MIL/uL (ref 3.87–5.11)
RBC: 4.38 MIL/uL (ref 3.87–5.11)
RDW: 12.6 % (ref 11.5–15.5)
RDW: 12.8 % (ref 11.5–15.5)
RDW: 13.1 % (ref 11.5–15.5)
WBC: 5.4 10*3/uL (ref 4.0–10.5)
WBC: 6.5 10*3/uL (ref 4.0–10.5)
WBC: 6.7 10*3/uL (ref 4.0–10.5)

## 2010-06-22 LAB — HEPATIC FUNCTION PANEL
ALT: 18 U/L (ref 0–35)
AST: 25 U/L (ref 0–37)
Albumin: 3.8 g/dL (ref 3.5–5.2)
Alkaline Phosphatase: 88 U/L (ref 39–117)
Bilirubin, Direct: 0.1 mg/dL (ref 0.0–0.3)
Indirect Bilirubin: 0.3 mg/dL (ref 0.3–0.9)
Total Bilirubin: 0.4 mg/dL (ref 0.3–1.2)
Total Protein: 7 g/dL (ref 6.0–8.3)

## 2010-06-22 LAB — URINALYSIS, ROUTINE W REFLEX MICROSCOPIC
Bilirubin Urine: NEGATIVE
Glucose, UA: NEGATIVE mg/dL
Hgb urine dipstick: NEGATIVE
Ketones, ur: NEGATIVE mg/dL
Nitrite: NEGATIVE
Protein, ur: NEGATIVE mg/dL
Specific Gravity, Urine: 1.008 (ref 1.005–1.030)
Urobilinogen, UA: 0.2 mg/dL (ref 0.0–1.0)
pH: 6.5 (ref 5.0–8.0)

## 2010-06-22 LAB — CK TOTAL AND CKMB (NOT AT ARMC)
CK, MB: 1.7 ng/mL (ref 0.3–4.0)
Relative Index: 0.2 (ref 0.0–2.5)
Total CK: 890 U/L — ABNORMAL HIGH (ref 7–177)

## 2010-06-22 LAB — DIFFERENTIAL
Basophils Absolute: 0 10*3/uL (ref 0.0–0.1)
Basophils Relative: 0 % (ref 0–1)
Eosinophils Absolute: 0.4 10*3/uL (ref 0.0–0.7)
Eosinophils Relative: 6 % — ABNORMAL HIGH (ref 0–5)
Lymphocytes Relative: 30 % (ref 12–46)
Lymphs Abs: 1.9 10*3/uL (ref 0.7–4.0)
Monocytes Absolute: 0.6 10*3/uL (ref 0.1–1.0)
Monocytes Relative: 9 % (ref 3–12)
Neutro Abs: 3.6 10*3/uL (ref 1.7–7.7)
Neutrophils Relative %: 55 % (ref 43–77)

## 2010-06-22 LAB — BASIC METABOLIC PANEL
BUN: 10 mg/dL (ref 6–23)
BUN: 9 mg/dL (ref 6–23)
CO2: 29 mEq/L (ref 19–32)
CO2: 29 mEq/L (ref 19–32)
Calcium: 8.7 mg/dL (ref 8.4–10.5)
Calcium: 9 mg/dL (ref 8.4–10.5)
Chloride: 102 mEq/L (ref 96–112)
Chloride: 104 mEq/L (ref 96–112)
Creatinine, Ser: 0.82 mg/dL (ref 0.4–1.2)
Creatinine, Ser: 0.87 mg/dL (ref 0.4–1.2)
GFR calc Af Amer: >60 mL/min (ref >60)
GFR calc Af Amer: >60 mL/min (ref >60)
GFR calc non Af Amer: >60 mL/min (ref >60)
GFR calc non Af Amer: >60 mL/min (ref >60)
Glucose, Bld: 90 mg/dL (ref 70–99)
Glucose, Bld: 97 mg/dL (ref 70–99)
Potassium: 3.6 mEq/L (ref 3.5–5.1)
Potassium: 3.6 mEq/L (ref 3.5–5.1)
Sodium: 137 mEq/L (ref 135–145)
Sodium: 138 mEq/L (ref 135–145)

## 2010-06-22 LAB — POCT I-STAT, CHEM 8
BUN: 12 mg/dL (ref 6–23)
Calcium, Ion: 1.11 mmol/L — ABNORMAL LOW (ref 1.12–1.32)
Chloride: 99 mEq/L (ref 96–112)
Creatinine, Ser: 1 mg/dL (ref 0.4–1.2)
Glucose, Bld: 104 mg/dL — ABNORMAL HIGH (ref 70–99)
HCT: 43 % (ref 36.0–46.0)
Hemoglobin: 14.6 g/dL (ref 12.0–15.0)
Potassium: 3.9 mEq/L (ref 3.5–5.1)
Sodium: 134 mEq/L — ABNORMAL LOW (ref 135–145)
TCO2: 29 mmol/L (ref 0–100)

## 2010-06-22 LAB — MAGNESIUM: Magnesium: 2.2 mg/dL (ref 1.5–2.5)

## 2010-06-22 LAB — POCT CARDIAC MARKERS
CKMB, poc: <1 ng/mL — ABNORMAL LOW (ref 1.0–8.0)
Myoglobin, poc: 102 ng/mL (ref 12–200)
Troponin i, poc: <0.05 ng/mL (ref 0.00–0.09)

## 2010-06-22 LAB — LIPID PANEL
Cholesterol: 132 mg/dL (ref 0–200)
HDL: 44 mg/dL (ref >39)
LDL Cholesterol: 65 mg/dL (ref 0–99)
Total CHOL/HDL Ratio: 3 RATIO
Triglycerides: 114 mg/dL (ref <150)
VLDL: 23 mg/dL (ref 0–40)

## 2010-06-22 LAB — CARDIAC PANEL(CRET KIN+CKTOT+MB+TROPI)
CK, MB: 2 ng/mL (ref 0.3–4.0)
CK, MB: 2.6 ng/mL (ref 0.3–4.0)
Relative Index: 0.2 (ref 0.0–2.5)
Relative Index: 0.3 (ref 0.0–2.5)
Total CK: 810 U/L — ABNORMAL HIGH (ref 7–177)
Total CK: 853 U/L — ABNORMAL HIGH (ref 7–177)
Troponin I: 0.01 ng/mL (ref 0.00–0.06)
Troponin I: <0.01 ng/mL (ref 0.00–0.06)

## 2010-06-22 LAB — TSH: TSH: 2.155 u[IU]/mL (ref 0.350–4.500)

## 2010-06-22 LAB — PROTIME-INR
INR: 0.9 (ref 0.00–1.49)
Prothrombin Time: 12.4 seconds (ref 11.6–15.2)

## 2010-06-22 LAB — HEPATITIS PANEL, ACUTE
HCV Ab: NEGATIVE
Hep A IgM: NEGATIVE
Hep B C IgM: NEGATIVE
Hepatitis B Surface Ag: NEGATIVE

## 2010-06-22 LAB — T4, FREE: Free T4: 1.42 ng/dL (ref 0.80–1.80)

## 2010-06-22 LAB — ANA: Anti Nuclear Antibody(ANA): NEGATIVE

## 2010-06-22 LAB — BRAIN NATRIURETIC PEPTIDE: Pro B Natriuretic peptide (BNP): 200 pg/mL — ABNORMAL HIGH (ref 0.0–100.0)

## 2010-06-22 LAB — TROPONIN I: Troponin I: <0.01 ng/mL (ref 0.00–0.06)

## 2010-06-22 LAB — MITOCHONDRIAL ANTIBODIES: Mitochondrial M2 Ab, IgG: 0.51

## 2010-08-24 NOTE — Cardiovascular Report (Signed)
NAME:  [name], [name]                ACCOUNT NO.:  [id number]   MEDICAL RECORD NO.:  [id number]          PATIENT TYPE:  OIB   LOCATION:  1961                         FACILITY:  [facility]   PHYSICIAN:  [name]. Bensimhon, MDDATE OF BIRTH:  [birth year]   DATE OF PROCEDURE:  DATE OF DISCHARGE:                            CARDIAC CATHETERIZATION   PATIENT IDENTIFICATION:  [name] is a very pleasant 77 year old  woman with a history of gastroesophageal reflux and irritable bowel as  well as severe hypertension.  She presented to the office today  complaining of approximately a 1 week history of progressive chest pain  on exertion with some resting pain a few days ago.  This was very  concerning for angina.  She was brought over to the outpatient  catheterization lab for diagnostic angiography.   PROCEDURES PERFORMED:  1. Left heart cath.  2. Left ventriculogram.  3. Selective coronary angiography.   DESCRIPTION OF PROCEDURE:  The risks and indication of catheterization  were explained.  Consent was signed and placed on the chart.  A 4-French  arterial sheath was placed in the right femoral artery using a modified  Seldinger technique.  Standard catheters including JL-4, 3-D RC and  angled pigtail were used for the procedure.  All catheter exchanges were  made over wire.  There are no apparent complications.   Central aortic pressure was 192/80 with a mean of 125.  LV pressure  147/14 with an EDP of 21.  There is no aortic stenosis.   Left main was normal.   LAD coursed to the apex.  It gave off 2 diagonal branches.  In the  proximal portion of the LAD, there is a 50% lesion.  In the mid LAD,  there was a high-grade 99% lesion which appeared thrombotic.  There was  an ostial 70% lesion in a small first diagonal.  There was evidence of  left-to-left collaterals feeding the LAD.   Left circumflex was a difficult vessel to lay out.  It did give off a  branching ramus, small OM-1 and a  small OM-2.  There is diffuse disease  throughout the mid AV groove circ about 60% and what appeared to be the  ramus branch.  In the upper branch, there was a focal 70% lesion.   Right coronary artery was a large dominant vessel, it had a 50% ostial  lesion, 30% mid-lesion and a 60-70% focal lesion after the takeoff of  the acute marginal branch.  There was a PDA and several posterolaterals  which are free of significant disease.   Left ventriculogram done in the RAO position showed an EF of 75% with no  regional wall motion abnormalities.   ASSESSMENT:  1. Three-vessel coronary artery disease with high-grade thrombotic      lesion in the left anterior descending.  2. Otherwise nonobstructive coronary artery disease.  3. Hyperdynamic left ventricular function.   PLAN:  Percutaneous intervention on the LAD today with Dr. Riley Kill.      [name]. Bensimhon, MD  Electronically Signed     DRB/MEDQ  D:  08/07/2007  T:  08/07/2007  Job:  [id number]

## 2010-08-24 NOTE — Cardiovascular Report (Signed)
NAME:  [name], [name]                ACCOUNT NO.:  [id number]   MEDICAL RECORD NO.:  [id number]          PATIENT TYPE:  OIB   LOCATION:  1961                         FACILITY:  [facility]   PHYSICIAN:  Arturo Morton. Riley Kill, MD, FACCDATE OF BIRTH:  Mar 02, 1921   DATE OF PROCEDURE:  08/07/2007  DATE OF DISCHARGE:  08/07/2007                            CARDIAC CATHETERIZATION   INDICATIONS:  [name] is an 77 year old who has had some recurrent  episodes of chest pain.  She was seen in GI, sent to Dr. Gala Romney who  did a urgent cardiac catheterization.  This demonstrated a critical left  anterior descending stenosis.  She also has disease of the ostium of the  right coronary and distal right coronary.  There is also some  intermediate disease.  With these findings, Dr. Gala Romney felt that  stenting of the LAD would probably be optimal given the patient's age.  I was in agreement.  Risks, benefits, and alternatives were discussed  with the patient and her family in detail.  She had received aspirin and  clopidogrel downstairs, although no preprocedural aspirin prior to this  day.  As a result, we recommended giving eptifibatide.   PROCEDURE:  Percutaneous stenting of the left anterior descending  artery.   DESCRIPTION OF PROCEDURE:  The patient was brought to the  catheterization laboratory.  After informed consent, prepped and draped  in the usual fashion.  The 4 French sheath was exchanged for a 6 [place]  sheath.  ACT was checked and the appropriate amount of heparin given to  prolong the ACT between 200-250 seconds.  Additional heparin was  administered and a repeat ACT checked.  Eptifibatide was given according  to protocol.  We used a 3-0 guiding catheter.  A traverse wire was  utilized, and we were able to get down the LAD with some manipulation.  Following this, predilatation was done with a 2.25 x 15 apex balloon and  the vessel was stented using a 2.5 x 18 Promus drug-eluting  platform.  We discussed the various options stent wise with the patient and her  family in detail prior to deployment.  Following this, the stent was  postdilated using a 2.75 x 15 Quantum Maverick.  Inflations were done up  to 16 atmospheres.  There was dilatation throughout the stent.  Following this, the wire was removed and finally angiographic views  obtained.  There was an excellent angiographic appearance.   ANGIOGRAPHIC DATA:  The LAD has about a 30-40% proximal area of disease  prior to a septal and a diagonal.  After the septal and diagonal, there  is a hazy segmental stenosis of about 90%.  Following a second diagonal  which itself has diffuse luminal irregularities, there is an LAD which  courses to the apex.  This has luminal irregularities of about 40-50%  area of narrowing more distally.  Following the stenting, there was an  excellent angiographic result, no evidence of distal embolization with  TIMI-3 flow distally.   DISPOSITION:  We have discussed various options.  The patient will be  treated with aspirin and  Plavix, continue to follow up with Dr.  Gala Romney with decisions made regarding the right coronary artery.  We  will then follow.      Arturo Morton. Riley Kill, MD, [facility]  Electronically Signed     TDS/MEDQ  D:  08/07/2007  T:  08/08/2007  Job:  [id number]   cc:   [name]. Bensimhon, MD  Arturo Morton. Riley Kill, MD, [facility]

## 2010-08-24 NOTE — Assessment & Plan Note (Signed)
[facility]                            CARDIOLOGY OFFICE NOTE   MAEGEN, WIGLE                       MRN:          [id number]  DATE:08/28/2007                            DOB:          July 03, 1920    INTERVAL HISTORY:  Ms. Blossom is a very pleasant 77 year old woman with  a history of hypertension and gastroesophageal reflux disease.  I saw  her for an unscheduled visit on August 07, 2007, when she was complaining  of progressive chest pain suggestive of angina.  She underwent a fairly  urgent catheterization which showed a ruptured plaque in her mid-LAD  with subtotal occlusion.  She underwent PTCA and stenting with a drug-  eluting stent by Dr. Riley Kill.  She did have moderate coronary artery  disease in her other vessels.  She returns today for routine followup.   She is doing very well.  She is not having any further chest pain,  although she does have some reflux.  She has not had any problems with  the catheterization site.   CURRENT MEDICATIONS:  1. Synthroid 100 mcg daily.  2. Metoprolol 100 b.i.d.  3. Amlodipine 5 a day.  4. Benicar is on hold due to apparent hypotension.  5. Plavix 75 a day.  6. Aspirin 325 a day.   PHYSICAL EXAMINATION:  GENERAL:  She is well appearing, in no acute  distress. Ambulates around the clinic without respiratory difficulty.  VITAL SIGNS:  Blood pressure 122/73, heart rate 62. Weight 145.  HEENT:  Normal.  NECK:  Supple.  There is no JVD.  Carotids are 2+ bilaterally.  There is  a prominent left bruit. There is no lymphadenopathy or thyromegaly.  CARDIAC:  PMI is not displaced.  Regular rate and rhythm.  No murmurs,  rubs or gallops.  LUNGS:  Clear.  ABDOMEN:  Soft, nontender, nondistended.  No hepatosplenomegaly, no  bruits, no masses.  Good bowel sounds.  EXTREMITIES:  Warm with no cyanosis, clubbing or edema.  No rash.  Groin  looks good.  There is no hematoma.  No bruit.  NEUROLOGIC:  Alert and  oriented x3.  Cranial nerves II-XII are intact.  Moves all fours extremities without difficulty.  Affect is pleasant.   ASSESSMENT AND PLAN:  1. Coronary artery disease status post recent stenting of her left      anterior descending.  She is doing well.  Would continue Plavix      indefinitely.  2. Hypertension, well controlled.  3. Lipid status.  This is unknown.  However, given her coronary artery      disease, she absolutely needs a statin.  Will go ahead and start      her on simvastatin 20.  4. Left carotid bruit that she says is followed by Dr. Ricki Miller.   DISPOSITION:  We will see her back in several months for routine  followup.     [name]. Bensimhon, MD  Electronically Signed    DRB/MedQ  DD: 08/28/2007  DT: 08/28/2007  Job #: [id number]   cc:   Juline Patch, M.D.

## 2010-08-24 NOTE — Assessment & Plan Note (Signed)
[facility]                            CARDIOLOGY OFFICE NOTE   OCTAVIE, WESTERHOLD                       MRN:          [id number]  DATE:12/20/2007                            DOB:          12-Apr-1920    PRIMARY CARE PHYSICIAN:  Lori Patch, MD   INTERVAL HISTORY:  [name] is a delightful 77 year old woman with a  history of hypertension and gastroesophageal reflux disease.  She also  has a history of coronary artery disease and underwent stenting of the  LAD with a drug-eluting stent in April 2009.  She had moderate coronary  artery disease, in other vessels, her EF was normal.   She returns today for routine followup.  She is doing fairly well.  She  is in fact even bowling once a week and bowled 136 the other day.  She  does note that she has occasional pain in her chest and stomach.  She  really thinks it is gas, it is much different from her previous angina.  She also has chronic dyspnea.  She refused cardiac rehab as she just did  not want to get up that early and come in.  She is not doing much  walking on her own.  She has not noticed a change overall in her  exercise tolerance.   She has not had any orthopnea.  No PND.  No lower extremity edema.  Remainder of review of systems is negative except for HPI and problem  list.   CURRENT MEDICATIONS:  1. Levoxyl 100 mcg a day.  2. Metoprolol 100 b.i.d.  3. Amlodipine 5 a day.  4. Aspirin 325 a day.  5. Plavix 75 a day.  6. Simvastatin 40 at night.  7. Protonix 40 a day.   PHYSICAL EXAMINATION:  GENERAL:  She is an elderly woman, no acute  distress, ambulates around the clinic slowly with no respiratory  difficulty.  VITAL SIGNS:  Blood pressure is 120/80, heart rate 71, weight is 146.  HEENT:  Normal.  NECK:  Supple.  There is no JVD.  She has a soft left carotid bruit.  There is no lymphadenopathy or thyromegaly.  CARDIAC:  PMI is nondisplaced.  Regular rate and rhythm.  No  murmurs,  rubs, or gallops.  LUNGS:  Clear.  ABDOMEN:  Soft, nontender, and nondistended.  No hepatosplenomegaly.  No  bruits.  No masses.  Good bowel sounds.  EXTREMITIES:  Warm with no cyanosis, clubbing, or edema.  No rash.  She  does have multiple ecchymosis.  NEURO:  Alert and x3.  Cranial nerves II through XII are intact.  Moves  all 4 extremities without difficulty.  Affect is pleasant.   EKG shows sinus rhythm with PVCs at a rate of 71.  No ST-T wave  abnormalities.   ASSESSMENT AND PLAN:  1. Coronary artery disease.  She is status post stenting of her LAD      with a drug-eluting stent.  She is doing well.  She does not appear      to be having any angina.  Continue Plavix indefinitely.  2. Hypertension, well-controlled.  3. Hyperlipidemia.  This is followed by Dr. Ricki Herrera.  Goal LDL is less      than 70.  4. Dyspnea.  I suspect this to deconditioning, but we will get an      echocardiogram to rule out any valvular pathology or pulmonary      hypertension.  5. Left carotid bruit followed by Dr. Ricki Herrera.     Lori Buckles. Bensimhon, MD  Electronically Signed    DRB/MedQ  DD: 12/20/2007  DT: 12/21/2007  Job #: [id number]   cc:   [name], M.D.

## 2010-08-24 NOTE — Letter (Signed)
August 09, 2007    Juline Patch, M.D.  [address]  [place], [place] [address]   RE:  [name], [name]  MRN:  [id number]  /  DOB:  05-08-1920   Dear Gerlene Burdock:   Your nice patient, [name], was seen in consultation by [name].  Bensimhon, MD.  She was subsequently admitted for same day  catheterization promptly, and this revealed a thrombotic subtotal  occlusion of the LAD in the mid LAD.  She had residual disease involving  both the circumflex and the right.  Most of these lesions were in the 60-  70% range.  Her LV function was hyperdynamic.  She underwent stenting  with a drug-eluting stent in the mid left anterior descending artery and  had an uncomplicated hospital course.  She did have some ecchymosis at  her cath site.   She is being discharged today with anticipated follow-up with Dr.  Gala Romney.  Her thyroxine medication has not changed.  She has been  maintained on metoprolol and Norvasc, and I have instructed her to  resume her Benicar if her blood pressure remains elevated at home.  He  was slightly lower after the procedure.  In addition, we have asked her  to withhold her Protonix at the present time as she is now on aspirin  and Plavix as dual antiplatelet therapy for the prevention of stent  thrombosis.  I have instructed her remain on dual antiplatelet therapy  for a minimum of a  year and on aspirin thereafter.  The decision to continue Plavix will be  made during discussion with the patient with Dr. Gala Romney.  He will see  her back in follow-up on Aug 28, 2007.   Thank you for referring her [facility] for cardiology care.    Sincerely,      Arturo Morton. Riley Kill, MD, [facility]  Electronically Signed    TDS/MedQ  DD: 08/09/2007  DT: 08/09/2007  Job #: [id number]   CC:    [name]. Bensimhon, MD

## 2010-08-24 NOTE — Assessment & Plan Note (Signed)
[facility]                            CARDIOLOGY OFFICE NOTE   CHENNEL, OLIVOS                       MRN:          [id number]  DATE:08/07/2007                            DOB:          10-12-1920    REFERRING PHYSICIAN:  Hedwig Morton. Juanda Chance, MD   REASON FOR CONSULTATION:  Chest pain.   HISTORY OF PRESENT ILLNESS:  Ms. Wechter is a very pleasant 77 year old  woman with a history of hypertension and gastroesophageal reflux  disease.  She denies any history of diabetes or hyperlipidemia.  She is  admitted to the hospital in August 2008 with chest pain.  This was  thought to be gastroesophageal reflux disease.  She ruled out for  myocardial infarction with serial cardiac markers. She had normal  echocardiogram.  She then underwent outpatient Myoview, not at our  center, but she tells me that was totally normal.   She was apparently doing quite well until about a month ago.  Over the  past month, she has been having increasing episodes of exertional  substernal chest pain.  These are associated with shortness of breath.  Episodes last for about 10-15 minutes and resolve with rest.  There is  no associated reflux symptoms.  She also reports she has had some  nighttime episodes.  Her last episode of chest pain was on Friday night  when she was walking at the [facility].   Today, she was not feeling well.  She has felt a little bit run down,  but no chest pain.  She called the GI office and told them about her  symptoms and was referred here for evaluation.   REVIEW OF SYSTEMS:  She denies any nausea, vomiting.  No fevers or  chills.  No bright red blood per rectum.  No melena.  No focal  neurologic symptoms.  She does have fatigue and hypothyroidism.  Also  has arthritis.  Remainder of review of systems is negative except for  HPI and problem list.   PROBLEM LIST:  1. Hypertension.  2. Gastroesophageal reflux disease.  3. Irritable bowel.  4. History of chest pain in the setting of normal echo and Myoview      back in 2006.  No history of catheterization.  5. Hypothyroidism.   CURRENT MEDICATIONS:  1. Protonix 40 mg a day.  2. Levoxyl 100 mcg a day.  3. Metoprolol 100 b.i.d.  4. Amlodipine five a day.  5. Benicar 40 a day.   ALLERGIES:  She has no known drug allergies.   SOCIAL HISTORY:  She is retired.  She is a nonsmoker.  No alcohol.  She  is widowed.   FAMILY HISTORY:  As notable for uncles with coronary artery disease, but  no immediate family members.   PHYSICAL EXAMINATION:  GENERAL:  She is an elderly woman, in no acute  distress.  Ambulates around the clinic without any respiratory  difficulty.  VITAL SIGNS:  Blood pressure is 142/90, heart rate 61 weights 146.  HEENT:  Normal.  NECK:  Supple with no JVD.  Carotids are 2+ bilaterally  without bruits.  There is no lymphadenopathy or thyromegaly.  CARDIAC:  PMI is nondisplaced.  She is regular with S4.  No murmurs.  LUNGS:  Clear.  ABDOMEN:  Soft, nontender, nondistended, no hepatosplenomegaly, no  bruits, no masses.  Good bowel sounds.  EXTREMITIES:  Warm.  No cyanosis, clubbing or edema.  No rash.  NEUROLOGICAL:  Alert and oriented x3.  Cranial nerves II-XII are intact.  Moves all four extremities without difficulty.   STUDIES:  EKG shows sinus rhythm at a rate of 61.  She has T-wave  inversions in V1-V5.  These are slightly more prominent than previous  EKG in August 2008.   ASSESSMENT/PLAN:  1. Chest pain.  This is quite concerning for me for progressive      exertional angina.  She is currently pain free.  I have discussed      the situation with her, her son and daughter-in-law.  I think we      should proceed with cardiac catheterization to clearly identify her      coronary anatomy.  We will proceed with this today in the      outpatient catheterization lab.  2. Hypertension.  This is suboptimally controlled.  Consider abdominal       aortogram to rule out renal artery stenosis during her      catheterization.   DISPOSITION:  Pending on the results of her cath.     [name]. Bensimhon, MD  Electronically Signed    DRB/MedQ  DD: 08/07/2007  DT: 08/07/2007  Job #: [id number]

## 2010-08-24 NOTE — H&P (Signed)
NAMEJEROLYN, [name] NO.:  [id number]   MEDICAL RECORD NO.:  [id number]          PATIENT TYPE:  EMS   LOCATION:  MAJO                         FACILITY:  [facility]   PHYSICIAN:  Ladell Pier, M.D.   DATE OF BIRTH:  1921/03/28   DATE OF ADMISSION:  11/30/2006  DATE OF DISCHARGE:                              HISTORY & PHYSICAL   CHIEF COMPLAINT:  Chest pain.   HISTORY OF PRESENT ILLNESS:  The patient is a 77 year old white female  who presented with chest pain on and off for the past 3 days.  The pain  is substernal, and it radiated across her chest.  She presented to her  PCP 2 days ago and was started on Prevacid b.i.d., but the pain  persists.  The patient improves with sitting up.  The pain is not  related to food.  No radiation with breathing.  No history of heart  disease.   PAST MEDICAL HISTORY:  Significant for:  1. Hypertension.  2. Hypothyroidism.  3. Diverticulosis with history of diverticulitis.  4. Bowel incontinence that she has been evaluated by her PCP.  5. Back surgery in the past.   FAMILY HISTORY:  Mother died at 77 years old.  She had myasthenia  gravis.  Father died at 39 years old.  He fell and fractured his hip and  died a few weeks after that.  She has some uncles with history of heart  disease at a young age.   SOCIAL HISTORY:  She is widowed with 4 children.  No tobacco or alcohol  use.   MEDICATIONS:  1. Multivitamin daily.  2. Levoxyl 100 mcg daily.  3. Metoprolol tartrate 100 mg b.i.d.  4. Benicar 40 mg daily.  5. Norvasc 5 mg daily.  6. Aspirin 81 mg daily.  7. Prevacid 30 mg b.i.d.   ALLERGIES:  NONE.   REVIEW OF SYSTEMS:  As per stated in the HPI.   PHYSICAL EXAMINATION:  VITAL SIGNS:  Temperature 97.4, blood pressure  150/79, pulse 70, respiration 20, pulse ox 98% on room air.  HEENT:  Head is normocephalic, atraumatic.  Pupils reactive to light.  Throat without erythema.  CARDIOVASCULAR:  Regular rate and rhythm.  LUNGS:  Clear bilaterally.  ABDOMEN:  Positive bowel sounds.  EXTREMITIES:  Without edema.   LABORATORY:  Sodium 135, potassium 4.2, chloride 102, CO2 of 27, BUN 12,  creatinine 0.9, glucose 92, EKG:  Normal sinus rhythm with some PACs.  First set of cardiac enzymes negative.   ASSESSMENT AND PLAN:  1. Chest pain:  I am not sure of the etiology, sounds more like      reflux; says when she sits up it is better, but sometimes can      present atypical.  Will admit her.  Cycle her enzymes.  Will get an      EKG in the morning.  Get a 2D echo.  Check TSH, d-dimer, and get a      cardiology consult.  2. Question of bowel incontinence:  The patient states that she has  been having problems losing control of her stool for the past few      months.  She did mention this to her primary care physician and got      some medication.  She is not sure what it is to take for that      problem.  She last had her colonoscopy, she said to me, 5 years      ago.  We will get a KUB to start with.  She has known back      tenderness, but if the KUB is normal, then probably we will do a      further evaluation of her back and get x-rays of her back.  Her      bowel incontinence is unlikely to be related to any cord      compression, as her neuro exam in the lower extremities is fine,      but we will start off with a KUB to evaluate this, as I mentioned      before, and then we will continue workup based on the results.  3. Hypertension:  We will continue her on her home medications.  4. Hypothyroidism:  Check TSH and continue her on her Levoxyl.      Ladell Pier, M.D.  Electronically Signed     NJ/MEDQ  D:  11/30/2006  T:  11/30/2006  Job:  [id number]   cc:   Juline Patch, M.D.

## 2010-08-24 NOTE — Consult Note (Signed)
[name], [name]                ACCOUNT NO.:  [id number]   MEDICAL RECORD NO.:  [id number]          PATIENT TYPE:  INP   LOCATION:  1831                         FACILITY:  [facility]   PHYSICIAN:  Madolyn Frieze. Jens Som, MD, FACCDATE OF BIRTH:  08/23/1920   DATE OF CONSULTATION:  11/30/2006  DATE OF DISCHARGE:                                 CONSULTATION   PRIMARY CARE PHYSICIAN:  Dr. Ricki Miller.   PRIMARY CARDIOLOGIST:  Is new and will be Dr. Jens Som.   CHIEF COMPLAINT:  Chest pain.   HISTORY OF PRESENT ILLNESS:  [name] is an 77 year old female with  no history of coronary artery disease.  She had onset of epigastric pain  Tuesday in the evening, without exertion.  She states that her symptoms  were worse with a supine position.  She had recently been started on  Prevacid by her family physician which was no help.  She was also having  shortness of breath but denies any nausea or vomiting.  Her symptoms did  not change with deep inspiration but did improve when she sat up.  She  states that she will sleep propped up on extra pillows and with her bed  up on bricks, but if she slides down or changes position, she will be  wakened.  Early a.m. symptoms are the worst.  She has had symptoms both  Tuesday night and Wednesday night, and then this morning they woke her  at an 8/10.  She called her family physician who had told her to come in  on Tuesday but she refused.  He again told her to come to the emergency  room, and she did so.  In the emergency room, she has received aspirin  and sublingual nitroglycerin.  Her symptoms began today at approximately  5:00 a.m..  They have been continuous since then but were severe for  only about an hour.  Currently, she is complaining of 1/10 pain, and  this resolved completely with 2 mg of morphine.   PAST MEDICAL HISTORY:  She has a history of hypertension but no history  of diabetes, hyperlipidemia, family history of coronary artery disease  or  tobacco use.  She has a history of hypothyroidism and diverticulosis  as well as degenerative joint disease with a herniated lumbar disk.   SURGICAL HISTORY:  She has had an abdominal hysterectomy as well as  rectocele repair and a lumbar HNP.   ALLERGIES:  No known drug allergies.   MEDICATIONS:  Prior to admission included:  1. Multivitamin.  2. Levoxyl 100 mcg daily.  3. Metoprolol 100 mg b.i.d.  4. Benicar 40 mg a day.  5. Norvasc 5 mg daily.  6. Prevacid 30 mg a day.   SOCIAL HISTORY:  She lives in [place] alone and is a retired Nurse, mental health and housewife.  She has been a widow for approximately 2 years  and has no history of alcohol, tobacco or drug abuse.  She is active  around the home but does not exercise regularly and feels that she eats  a heart healthy diet.  FAMILY HISTORY:  Her mother died at age 58 of myasthenia gravis.  Her  father died at age 48 of complications after hip fracture which was  secondary to a fall.  She has no coronary artery disease in any of her  siblings.   REVIEW OF SYSTEMS:  She has had a headache today.  She wears glasses.  She has chronic dyspnea on exertion.  She has occasional lower extremity  edema.  She has not had any coughing or wheezing.  She has had no fever  or chills.  She has had some weakness, but it is not unilateral.  She  has some chronic arthralgias and has had back pain.  She has had some  issues with constipation as well as bowel incontinence which has been  worse recently.  Review of systems is otherwise negative.   PHYSICAL EXAMINATION:  VITAL SIGNS:  Temperature is 97.4, blood pressure  150/79, pulse 70, respiratory 28, O2 saturation 98% on room air.  GENERAL:  She is a well-developed, elderly, white female in no acute  distress.  HEENT:  Is normal.  NECK:  There is no lymphadenopathy, thyromegaly, bruit or JVD noted.  CARDIOVASCULAR:  Her heart is regular rate and rhythm with an S1-S2, and  a soft systolic  murmur is noted at left sternal border.  Distal pulses  are intact in all four extremities, and no femoral bruits are  appreciated.  LUNGS:  Are essentially clear to auscultation bilaterally.  SKIN:  No rashes or lesions are noted.  ABDOMEN:  Soft and she has slight left lower quadrant tenderness.  Bowel  sounds good, and there is no rebound or guarding.  There is no  splenomegaly.  EXTREMITIES:  She has trace edema but no cyanosis or clubbing or rash.  MUSCULOSKELETAL:  There is no joint deformity or effusions and no spine  or CVA tenderness.  NEUROLOGICAL:  She is alert and oriented with cranial nerves II-XII  grossly intact.   CHEST X-RAY:  No acute disease.   EKG:  Sinus rhythm, rate 77, with no acute ischemic changes and not  significantly changed from an EKG dated 2002.   Laboratory values are generally pending, but her D-dimer was less than  0.22, and point of care markers were negative x1.   IMPRESSION:  Chest pain.  [name] is an 77 year old female with a  past medical history including hypertension.  She has had intermittent  chest pain for several days.  The pain is epigastric in location and  increased by lying flat.  She has also had water brash, and her symptoms  are increased with turning on her side.  She now admits to mild nausea  but no diaphoresis.  Duration is about 30 minutes.  Her chest pain was  reproduced with palpation.  Her EKG does not have any acute EKG changes.  As her chest pain is atypical and initial enzymes are negative, we agree  with admit to rule out MI but would treat for musculoskeletal pain and  reflux.  Oxycodone has been ordered, but it is likely that Darvocet will  be adequate, and this will be tried.  Her pain was completely relieved  with 2 mg of morphine.  If her cardiac enzymes remain negative, an  outpatient Myoview is adequate for an evaluation for ischemia.  Further  evaluation and treatment will be per the primary care team,  but  consideration can be given to increasing her Norvasc for better blood  pressure  control.      Theodore Demark, PA-C      Madolyn Frieze. Jens Som, MD, [facility]  Electronically Signed    RB/MEDQ  D:  11/30/2006  T:  12/01/2006  Job:  [id number]   cc:   Juline Patch, M.D.

## 2010-08-24 NOTE — Assessment & Plan Note (Signed)
[facility]                                 ON-CALL NOTE   [name], [name]                       MRN:          [id number]  DATE:12/05/2006                            DOB:          Jul 01, 1920    PRIMARY CARE PHYSICIAN:  Juline Patch, M.D.   CARDIOLOGIST:  Madolyn Frieze. Jens Som, MD, [facility].   HISTORY:  [name] is an 77 year old female patient who was recently  seen at [facility] by our service for chest pain.  She apparently ruled out for myocardial infarction by enzymes.  She was  set up for an outpatient stress test.  Her daughter-in-law called the  answering service tonight because she was concerned about her  medications.  She says that the patient was placed on Advil q.6h. for  seven days at discharge by Dr. Daleen Squibb for empiric treatment of possible  pericarditis.  Her stress test is scheduled for next week.  She is  having some stomach discomfort and actually saw her primary care  physician today who took her off of the Advil.  She has apparently been  prescribed Prevacid daily as well.  I spoke to the patient.  She notes  that her chest pain is improving on the Advil.  She notes that she has  had no melena or hematochezia.  She has not been taking Advil with food.   PLAN:  I advised the patient to continue taking her Advil with food  only.  I also asked her to increase the Prevacid to twice a day.  I  asked her to contact our office if she continues to have abdominal  discomfort with these measures.   DISPOSITION:  As noted above.  She will follow up in our office after  stress nuclear study.      Tereso Newcomer, PA-C  Electronically Signed      Learta Codding, MD,FACC  Electronically Signed   SW/MedQ  DD: 12/05/2006  DT: 12/06/2006  Job #: [id number]   cc:   Juline Patch, M.D.

## 2010-08-27 NOTE — Op Note (Signed)
[facility]  Patient:    [name], [name] Visit Number: [id number] MRN: [id number]          Service Type: DSU Location: [id number] Attending Physician:  Miguel Aschoff Dictated by:   Miguel Aschoff, M.D. Proc. Date: 03/15/01 Admit Date:  03/15/2001                             Operative Report  PREOPERATIVE DIAGNOSIS:       Symptomatic rectocele and enterocele.  POSTOPERATIVE DIAGNOSIS:      Symptomatic rectocele and enterocele.  OPERATION:                    Repair of rectocele and enterocele.  SURGEON:                      Miguel Aschoff, M.D.  ASSISTANT:                    Brook A. Edward Jolly, M.D.  ANESTHESIA:                   General anesthesia.  COMPLICATIONS:                None.  ESTIMATED BLOOD LOSS:         70 cc.  INDICATIONS:                  The patient is a 77 year old white female status post abdominal hysterectomy in 1974 and four normal spontaneous vaginal deliveries.  The patient reports that on standing and straining, she notes protrusion through the introitus and on examination she is noted to have a mass bulging through the introitus which is consistent with a large rectocele above which exists also an enterocele. She presents now to have these defects in the pelvic floor repaired.  The risks and benefits of the procedure have been discussed with the patient.  DESCRIPTION OF PROCEDURE:     The patient was taken to the operating room and placed in the supine position and general anesthesia was administered without difficulty.  She was then placed in dorsal lithotomy position and prepped and draped in the usual sterile fashion.  The posterior vaginal wall and perineum were injected with dilute Neo-Synephrine solution.  Ellipse of skin was then incised in the perineum and elevated and then dissection of the posterior vaginal mucosa was carried up to the apex of the vaginal cuff.  At the vaginal cuff, the remnants of the uterosacral  ligaments were identified and tagged using figure-of-eight sutures of 0 Vicryl. These were then elevated.  Then the vaginal mucosa was separated from the perirectal tissue, then the large rectocele was identified and dissected out.  At the apex of the cuff, the enterocele was identified.  Peritoneum was then entered. The enterocele was reduced using pursestring suture of 0 Vicryl and the excess tissue from the enterocele sac was excised.  The rectocele was then reduced using serial pursestring sutures of 0 Vicryl suture.  Once it was reduced, it was reinforced using interrupted 0 Vicryl sutures.  The perirectal fascia was brought across the defect using interrupted 0 Vicryl sutures.  Once this was done, the levator muscles were brought together using interrupted 0 Vicryl sutures.  The excess vaginal mucosa was trimmed. The uterosacral ligaments were then sutured to each other at the apex to support the cuff and the vaginal mucosa  was closed using running interlocking 0 Vicryl suture.  The peritoneum was then reapproximated using running continuous 2-0 Vicryl suture and the perineal skin was closed using subcuticular 2-0 Vicryl suture. The estimated blood loss was approximately 70 cc.  The patient tolerated the procedure well and was taken to the recovery room in satisfactory condition. A vaginal pack of iodoform gauze was placed in the vagina prior to completion of the procedure as was the Foley catheter which drained clear urine. Dictated by:   Miguel Aschoff, M.D. Attending Physician:  Miguel Aschoff DD:  03/15/01 TD:  03/15/01 Job: [id number]

## 2010-08-27 NOTE — Discharge Summary (Signed)
[name], [name]                ACCOUNT NO.:  [id number]   MEDICAL RECORD NO.:  [id number]          PATIENT TYPE:  INP   LOCATION:  6526                         FACILITY:  [facility]   PHYSICIAN:  Arturo Morton. Riley Kill, MD, FACCDATE OF BIRTH:  05-Feb-1921   DATE OF ADMISSION:  08/07/2007  DATE OF DISCHARGE:  08/09/2007                               DISCHARGE SUMMARY   PRIMARY CARDIOLOGIST:  [name]. Bensimhon, MD   PRIMARY CARE PHYSICIAN:  Not listed.   GASTROENTEROLOGIST:  Hedwig Morton. Juanda Chance, MD.   PROCEDURES PERFORMED DURING HOSPITALIZATION:  Cardiac catheterization.  A.  Three-vessel coronary artery disease with high-grade thrombotic  lesion in the left anterior descending artery, otherwise nonobstructive  coronary artery disease, hyperdynamic left ventricular function.  B.  Status post percutaneous coronary intervention to the left anterior  descending using a 2.5- x 80-mm Palmaz drug-eluting stent per Dr.  Riley Kill on August 07, 2007.   FINAL DISCHARGE DIAGNOSES:  1. Coronary artery disease.  1A.  Status post cardiac catheterization with percutaneous coronary  intervention to the left anterior descending using a Palmaz drug-eluting  stent with other nonobstructive disease noted and found ruptured plaque  in the mid left anterior descending with a septal occlusion.  1. Hypertension.  2. Gastroesophageal reflux disease.  3. Irritable bowel syndrome.  4. Hypothyroidism.   HOSPITAL COURSE:  This is an 77 year old female with a history of  hypertension and GERD with complaints of chest discomfort, which she  also had associated shortness of breath.  The pain was substernal  lasting for about 10-15 minutes and resolved with rest.  She also  reported some nighttime episodes, as a result of this the patient was  admitted for cardiac catheterization.   The patient did undergo cardiac catheterization as described above and  did have PCI to the mid LAD with a drug-eluting stent by Dr.  Riley Kill.  She also had moderate coronary artery disease in the other vessels and  will be treated medically for that.  The patient tolerated the procedure  well without any further complaints of chest discomfort.   Labs were found to be stable.  There were no elevations to cardiac  enzymes.  The patient was placed on Pepcid along with Plavix on  discharge and aspirin.  The patient will follow up with Dr. Gala Romney in  the office as an outpatient for continued cardiac care.   DISCHARGE LABORATORY:  Bilirubin 0.8, direct bilirubin 0.1, alkaline  phosphatase 108, SGOT 26, total protein 7.8, albumin 4.0, ALT 15, sodium  134, potassium 4.6, chloride 99, CO2 of 30, glucose 98, BUN 6,  creatinine 0.9, cholesterol 202, lipids 168, LDL 38.5, hemoglobin 12.1,  hematocrit 35.8, white blood cells 8.1, platelets 217,000.   DISCHARGE MEDICATIONS:  1. Pepcid 20 mg p.o. daily.  2. Synthroid 100 mcg daily.  3. Metoprolol 100 mg b.i.d.,  4. Amlodipine 5 mg daily.  5. Plavix 75 mg daily.  6. Aspirin 325 mg daily.   ALLERGIES:  No known drug allergies.   FOLLOWUP PLANS AND APPOINTMENTS:  1. The patient will follow up with Dr. Arvilla Meres  on Aug 28, 2007 for continuation of cardiac evaluation.  2. The patient will follow up with her primary care physician for      continuation of medical management.  3. The patient was given post cardiac catheterization instructions      with particular evidences on the right groin site for evidence of      bleeding hematoma or signs of infection.  4. The patient has been advised to bring medications to followup      appointment.   Time spent with the patient to include physician time 35 minutes.      Bettey Mare. Lyman Bishop, NP      Arturo Morton. Riley Kill, MD, [facility]  Electronically Signed    KML/MEDQ  D:  09/03/2007  T:  09/03/2007  Job:  [id number]   cc:   Juline Patch, M.D.

## 2010-08-27 NOTE — H&P (Signed)
[place]  Patient:    [name], [name]                       MRN: [id number] Adm. Date:  [id number] Attending:  Barton Fanny                         History and Physical  HISTORY OF PRESENT ILLNESS:  The patient is a 77 year old, right-handed white female, who was evaluated for an acute left lumbar radiculopathy.  The symptoms began about four weeks ago.  She was riding home from [place] to [place], and after about four to five hours, she got out at the [facility], and had a sharp pain in the left anterior thigh.  She had difficulty walking, and has been on medications and rest.  The pain has eased somewhat, but has extended down into the anterior aspect of her left knee, and anterior left leg, as well as in the left buttock and low back.  She finds that the pain is aggravated by standing and walking.  She is more comfortable sitting in fact than laying.  She does not describe any specific weakness, but does describe some numbness and tingling in the anterior aspect of the left leg.  She was evaluated with an MRI scan, which reveals extensive degenerative changes in the lumbar spine, with advanced degenerative disk disease at the L3-4 level, with near complete obliteration of the disk spaces, grade 1 anterior degenerative spondylolisthesis at L3 and L4.  There is spinal degeneration at multiple levels.  There is a grade 1 anterior degenerative spondylolisthesis at L4 and L5.  There is a grade 1 degenerative retrolisthesis at L2-L3.  There is marked spinal stenosis at L3-4, multifactorial in nature, but contributed to a significant extent by her listhesis, moderate to marked stenosis at the L4-5 level, again multifactorial in nature, again contributed to by her listhesis.  The most significant finding though is a large left L4-5 foraminal and extraforaminal disk herniation, with compression of the left L4 nerve roots.  The patient  is admitted now for a left L4-5 extraforaminal microdiskectomy.  PAST MEDICAL HISTORY:  She does not describe any history of hypertension, myocardial infarction, cancer, stroke, diabetes mellitus, peptic ulcer disease, or lung disease.  PAST SURGICAL HISTORY:  Hysterectomy 27 years ago.  ALLERGIES:  No known drug allergies.  CURRENT MEDICATIONS: 1. Premarin 0.625 mg q.d. 2. Hydrocodone for pain. 3. Vioxx for pain. 4. Cyclobenzaprine.  FAMILY HISTORY:  Her mother died at age 44 of complications of myasthenia gravis.  Her father died at age 63 of complications from a hip fracture from a fall.  SOCIAL HISTORY:  The patient is married.  She is retired.  She does not smoke or drink alcoholic beverages, or have a history of substance abuse.  REVIEW OF SYSTEMS:  Notable for that as described in her history of present illness and past medical history.  Otherwise is unremarkable.  PHYSICAL EXAMINATION:  GENERAL:  The patient is a well-developed, well-nourished white female, in no acute distress.  VITAL SIGNS:  Temperature 97.8 degrees, pulse 96, blood pressure 184/105, respirations 20.  Height 5 feet 6 inches, weight 140 pounds.  LUNGS:  Clear to auscultation.  She has symmetrical respiratory excursion.  HEART:  Regular rate and rhythm.  Normal S1, S2, with no murmur.  ABDOMEN:  Soft, nondistended.  Bowel sounds are present.  EXTREMITIES:  No cyanosis, clubbing,  or edema.  MUSCULOSKELETAL:  Shows no tenderness to palpation over the lumbar spinous process of the right paralumbar musculature, but there is some tenderness in the left paralumbar musculature.  She is able to flex beyond 90 degrees, able to extend.  The straight leg raising is negative bilaterally.  NEUROLOGIC:  Shows weak left dorsiflexor and extensor hallucis longus, both 4-/5.  The right dorsiflexor and extensor hallucis longus are 5/5.  The plantar flexor 5/5 bilaterally.  Sensation intact to pinprick through  the lower extremities.  Reflexes:  Left quadriceps is absent.  Right is 1-2. Gastrocnemius 1-2 bilaterally.  Toes are downgoing bilaterally.  She has a normal gait and stance.  She finds it difficult to stand for an extended period of time.  IMPRESSION: 1. Acute left lumbar radiculopathy, associated with a left L4-5 foraminal    and extraforaminal disk herniation, with left L4 nerve root compression,    with significant left dorsiflexor and extensor hallucis longus weakness,    and absence of the left quadriceps reflex. 2. She has multiple other degenerative changes throughout her lumbar spine.  PLAN:  The patient will be admitted for a left L4-5 extraforaminal microdiskectomy.  I discussed the nature of surgery, the typical length of surgery, the hospital stay and overall recuperation, and the limitations during the postoperative period, and the risks of surgery, including the risks of bleeding, infection, possible need for transfusion, the risks of nerve root dysfunction with pain, weakness, numbness, or paresthesias, the risks of recurrent disk herniation, possible need for further surgery, and the anesthetic risks of myocardial infarction, stroke, pneumonia, and death.  She understands that her lumbar stenosis may at some later date require a surgical intervention, and in fact could be contributing to some extent to her current symptoms.  She understands the alternatives to surgery, as continuing nonsurgically with her medications.  Understanding all of this, she does wish to proceed with surgery, and is admitted for such. DD:  06/05/00 TD:  06/05/00 Job: [id number]

## 2010-08-27 NOTE — Discharge Summary (Signed)
NAMEMALIA, CORSI NO.:  [id number]   MEDICAL RECORD NO.:  [id number]          PATIENT TYPE:  INP   LOCATION:  3735                         FACILITY:  [facility]   PHYSICIAN:  Michaelyn Barter, M.D. DATE OF BIRTH:  [birth year]   DATE OF ADMISSION:  11/30/2006  DATE OF DISCHARGE:  12/01/2006                               DISCHARGE SUMMARY   FINAL DIAGNOSES:  1. Chest pain.  2. Probable pericarditis.  3. Questionable bowel incontinence.  4. Hypertension.   CONSULTATION:  [facility] with Dr. Juanito Doom.   PROCEDURES.:  1. Portable chest x-ray completed November 30, 2006.  2. Portable abdominal x-ray completed November 30, 2006.  3. Abdominal ultrasound completed December 01, 2006   HISTORY OF PRESENT ILLNESS:  Ms. Bocchino is an 77 year old female who  indicated that she had experienced chest pain off and on for 3 days  leading up to this admission. Her pain was described as being substernal  with radiation across her chest.  She went to see her primary care  doctor 2 days prior to that admission and was started on Prevacid;  however, the pain persisted.  She indicated that her symptoms improved  when she sat up, and she did not feel that there was any relationship to  food.   PAST MEDICAL HISTORY:  Please see that dictated by Dr. Ladell Pier.   HOSPITAL COURSE:  #1.  CHEST PAIN:  The patient's cardiac markers were cycled during the  course of her hospitalization. Her troponin I were found to be 0.05,  0.04, and 0.04.  Her CK-MBs were found to be 1.6, 1.6, and 2.1.  Therefore, she ruled out for a myocardial infarct.  A D-dimer was found  to be less than 0.22, decreasing the likelihood that she had a PE.  A  portable chest x-ray was done on November 30, 2006, which revealed no  acute findings. [facility] was consulted. Dr. Olga Millers  actually initially saw the patient. His impression was that her EKG did  not have any acute EKG changes and that  her chest pain appeared to be  atypical. He also stated that treatment for musculoskeletal pain should  commence, and there appeared to have been some consideration that the  patient's history was consistent with pericarditis per Dr. Vern Claude  assessment on August 22. By the day of discharge, the patient indicated  that she felt better, although she did have some slight positional chest  discomfort.   #2.  QUESTIONABLE BOWEL INCONTINENCE:  The patient did not complained of  any ongoing issues with regards to her bowels in the course of this  hospitalization.  Therefore, the decision was made that she could follow  up with her primary care doctor once she was discharged from the  hospital. An ultrasound of the patient's abdomen was completed on August  22. The gallbladder could not be visualized despite appropriate period  of fasting. There was no evidence for wall echo shadow sign to suggest  the gallbladder contracted around stones.  There was no intrahepatic or  extrahepatic biliary dilatation seen. On  the day of discharge, the  patient indicated that she felt better.  Her vital signs showed  temperature  97.8, heart rate 61, respirations 20, blood pressure  184/68, O2 saturation 100% on 2 liters.   #3.  HYPERTENSION:  The patient's blood pressure was not ideally  controlled, although attempts were made to do so. Decision was made to  discharge the patient from the hospital. Prior to her discharge, she did  have a 2-D echocardiogram completed on December 01, 2006, which revealed  overall left ventricular systolic function to be normal. Left  ventricular EF was estimated to be 60%.  There was no diagnostic  evidence of left ventricular regional wall motion abnormalities. The  patient was discharged from the hospital.   DISCHARGE MEDICATIONS:  1. Norvasc 5 mg p.o. daily.  2. Advil 1 mg p.o. daily.  3. Metoprolol 100 mg p.o. b.i.d.  4. Benicar 40 mg p.o. daily.   She was told to keep a  blood pressure diary, and she was scheduled for  an adenosine Myoview on December 14, 2006, at 12:30 with [facility].  She was told to follow up with Dr. Jens Som on January 03, 2007, at 12:30 and to call Dr. Ricki Miller for a followup appointment.      Michaelyn Barter, M.D.  Electronically Signed     OR/MEDQ  D:  12/26/2006  T:  12/26/2006  Job:  [id number]

## 2010-08-30 ENCOUNTER — Encounter: Payer: Self-pay | Admitting: Internal Medicine

## 2010-09-03 ENCOUNTER — Ambulatory Visit (INDEPENDENT_AMBULATORY_CARE_PROVIDER_SITE_OTHER): Payer: Medicare Other | Admitting: Internal Medicine

## 2010-09-03 ENCOUNTER — Encounter: Payer: Self-pay | Admitting: Internal Medicine

## 2010-09-03 VITALS — BP 148/78 | HR 61 | Resp 16 | Ht 66.0 in | Wt 146.0 lb

## 2010-09-03 DIAGNOSIS — I2510 Atherosclerotic heart disease of native coronary artery without angina pectoris: Secondary | ICD-10-CM

## 2010-09-03 DIAGNOSIS — I10 Essential (primary) hypertension: Secondary | ICD-10-CM

## 2010-09-03 NOTE — Patient Instructions (Signed)
Continue current medications.   Follow up in 3 months with Dr Bensimhon 

## 2010-09-03 NOTE — Progress Notes (Signed)
HPI:  [name] is an 77 y/o with h/o severe HTN, IBS and CAD with history of Promus DES to LAD in 4/09.  Admitted in 11/11 for [place]. Underwent cath -revealing a 95% stenosis in the distal RCA.  70-80% stenosis in a small left circumflex and 70-80% stenosis in the mid LAD.  The RCA was stented with a 3.0 x 15 mm Promus drug-eluting stent.  EF is 70%.  Returns for f/u. Was going to [employer] but hurt her back so not going any more. Remains very active with social activities.  Occasional CP when she lays down at night NTG helps. No CP with exertion or significant SOB. Dr. Ricki Miller following her cholesterol. SBP 120s at home.   ROS: All systems negative except as listed in HPI, PMH and Problem List.  Past Medical History  Diagnosis Date  . CAD (coronary artery disease)   . HTN (hypertension)   . Hypothyroidism   . Diverticulosis   . IBS (irritable bowel syndrome)   . Carotid bruit   . HLD (hyperlipidemia)   . GERD (gastroesophageal reflux disease)   . Hemorrhoids     Current Outpatient Prescriptions  Medication Sig Dispense Refill  . amLODipine (NORVASC) 5 MG tablet Take 5 mg by mouth daily.        Marland Kitchen aspirin 325 MG tablet Take 325 mg by mouth daily.        . Calcium Carbonate-Vitamin D (CALCIUM 600+D) 600-400 MG-UNIT per tablet Take 1 tablet by mouth daily.        . clopidogrel (PLAVIX) 75 MG tablet Take 75 mg by mouth daily.        . cyanocobalamin 500 MCG tablet Take 500 mcg by mouth daily.        . enalapril (VASOTEC) 5 MG tablet Take 5 mg by mouth 2 (two) times daily.        Marland Kitchen levothyroxine (SYNTHROID, LEVOTHROID) 100 MCG tablet Take 100 mcg by mouth daily.        . metoprolol (LOPRESSOR) 100 MG tablet Take 100 mg by mouth 2 (two) times daily.        . Multiple Vitamin (MULTIVITAMIN) tablet Take 1 tablet by mouth daily.        . nitroGLYCERIN (NITROSTAT) 0.4 MG SL tablet Place 0.4 mg under the tongue every 5 (five) minutes as needed.        Marland Kitchen omeprazole (PRILOSEC) 20 MG capsule Take 20 mg by  mouth daily.        . simvastatin (ZOCOR) 20 MG tablet Take 20 mg by mouth at bedtime.        Marland Kitchen DISCONTD: metoprolol tartrate (LOPRESSOR) 25 MG tablet Take 25 mg by mouth 2 (two) times daily.        Marland Kitchen DISCONTD: simvastatin (ZOCOR) 40 MG tablet Take 40 mg by mouth at bedtime.           PHYSICAL EXAM: Filed Vitals:   09/03/10 1000  BP: 148/88  Pulse: 61  Resp: 16   Appears younger than her stated age.no resp difficulty HEENT: normal Neck: supple. no JVD. Carotids 2+ bilat; soft L bruit.  Cor: PMI nondisplaced. Regular rate & rhythm. No rubs, gallops, murmur. Lungs: clear Abdomen: soft, nontender, nondistended. No hepatosplenomegaly. No bruits or masses. Good bowel sounds. Extremities: no cyanosis, clubbing, rash, edema. + ecchymosis Neuro: alert & orientedx3, cranial nerves grossly intact. moves all 4 extremities w/o difficulty. affect pleasant    ECG: SR 60 with 1avb (216 ms) No ST-T wave  abnormalities.     ASSESSMENT & PLAN:

## 2010-09-03 NOTE — Assessment & Plan Note (Signed)
BP well controlled at home but mildly elevated here. Will continue to follow at home. If BP up increase amlodipine to 10 qd.

## 2010-09-03 NOTE — Assessment & Plan Note (Signed)
Overall doing well. CP sounds non-cardiac but would have very low-threshold for repeat cath if symptoms progress. Continue Plavix.

## 2010-09-20 ENCOUNTER — Other Ambulatory Visit: Payer: Self-pay | Admitting: Cardiovascular Disease

## 2010-11-18 ENCOUNTER — Encounter: Payer: Self-pay | Admitting: Internal Medicine

## 2010-12-03 ENCOUNTER — Ambulatory Visit (INDEPENDENT_AMBULATORY_CARE_PROVIDER_SITE_OTHER): Payer: Medicare Other | Admitting: Internal Medicine

## 2010-12-03 ENCOUNTER — Encounter: Payer: Self-pay | Admitting: Internal Medicine

## 2010-12-03 VITALS — BP 122/68 | HR 62 | Ht 64.0 in | Wt 147.0 lb

## 2010-12-03 DIAGNOSIS — I2510 Atherosclerotic heart disease of native coronary artery without angina pectoris: Secondary | ICD-10-CM

## 2010-12-03 DIAGNOSIS — E785 Hyperlipidemia, unspecified: Secondary | ICD-10-CM

## 2010-12-03 MED ORDER — ASPIRIN 81 MG PO TABS: 81.0000 mg | ORAL_TABLET | Freq: Every day | ORAL | Status: DC

## 2010-12-03 NOTE — Progress Notes (Signed)
HPI:  [name] is an 77 y/o with h/o severe HTN, IBS and CAD with history of Promus DES to LAD in 4/09.  Admitted in 11/11 for [place]. Underwent cath -revealing a 95% stenosis in the distal RCA.  70-80% stenosis in a small left circumflex and 70-80% stenosis in the mid LAD.  The RCA was stented with a 3.0 x 15 mm Promus drug-eluting stent.  EF is 70%.  Returns for f/u. Remains very active with social activities. Going bowling regularly. No longer going to [employer]. Having problems with urinary frequency/incontinence as well as intermittent diarrhea at night. Having occasional CP - typically worse at night. Doesn't feel that it is worse. Dr. Lina Sar moved Prevacid to nighttime a day or two ago - feels like it may be getting better. + exertional dyspnea. Still taking Plavix.  Recent lipids with Dr. Ricki Miller LDL = 77  ROS: All systems negative except as listed in HPI, PMH and Problem List.  Past Medical History  Diagnosis Date  . CAD (coronary artery disease)   . HTN (hypertension)   . Hypothyroidism   . Diverticulosis   . IBS (irritable bowel syndrome)   . Carotid bruit   . HLD (hyperlipidemia)   . GERD (gastroesophageal reflux disease)   . Hemorrhoids     Current Outpatient Prescriptions  Medication Sig Dispense Refill  . amLODipine (NORVASC) 5 MG tablet Take 5 mg by mouth daily.        Marland Kitchen aspirin 325 MG tablet Take 81 mg by mouth daily.       . Calcium Carbonate-Vitamin D (CALCIUM 600+D) 600-400 MG-UNIT per tablet Take 1 tablet by mouth daily.        . clopidogrel (PLAVIX) 75 MG tablet Take 75 mg by mouth daily.        . enalapril (VASOTEC) 5 MG tablet TAKE 1 TABLET BY MOUTH TWICE A DAY  60 tablet  6  . levothyroxine (SYNTHROID, LEVOTHROID) 100 MCG tablet Take 100 mcg by mouth daily.        . Multiple Vitamin (MULTIVITAMIN) tablet Take 1 tablet by mouth daily.        . nitroGLYCERIN (NITROSTAT) 0.4 MG SL tablet Place 0.4 mg under the tongue every 5 (five) minutes as needed.        .  simvastatin (ZOCOR) 20 MG tablet Take 20 mg by mouth at bedtime.        . tolterodine (DETROL LA) 2 MG 24 hr capsule Take 4 mg by mouth daily.           PHYSICAL EXAM: Filed Vitals:   12/03/10 1042  BP: 122/68  Pulse: 62   Appears younger than her stated age.no resp difficulty HEENT: normal Neck: supple. no JVD. Carotids 2+ bilat; soft L bruit.  Cor: PMI nondisplaced. Regular rate & rhythm. No rubs, gallops, murmur. Lungs: clear Abdomen: soft, nontender, nondistended. No hepatosplenomegaly. No bruits or masses. Good bowel sounds. Extremities: no cyanosis, clubbing, rash, edema. + ecchymosis Neuro: alert & orientedx3, cranial nerves grossly intact. moves all 4 extremities w/o difficulty. affect pleasant  ECG: SR 60 with 1avb (220 ms) No ST-T wave abnormalities. +PACs  ASSESSMENT & PLAN:

## 2010-12-03 NOTE — Patient Instructions (Signed)
Your physician has requested that you have a lexiscan myoview. For further information please visit [email or web address]. Please follow instruction sheet, as given.  Your physician wants you to follow-up in: 6 months.  You will receive a reminder letter in the mail two months in advance. If you don't receive a letter, please call our office to schedule the follow-up appointment.  

## 2010-12-03 NOTE — Assessment & Plan Note (Signed)
Has mild CP and dyspnea. Suspect this is mostly IBS but given recent stent and the fact that symptoms have progressed a bit will get Lexiscan Myoview to further evaluate. Continue asa 81 and plavix.

## 2010-12-03 NOTE — Assessment & Plan Note (Signed)
LDL near goal. Continue statin.

## 2010-12-10 ENCOUNTER — Encounter: Payer: Self-pay | Admitting: *Deleted

## 2010-12-20 ENCOUNTER — Ambulatory Visit (HOSPITAL_COMMUNITY): Payer: Medicare Other | Attending: Internal Medicine | Admitting: Radiology

## 2010-12-20 VITALS — Ht 64.0 in | Wt 142.0 lb

## 2010-12-20 DIAGNOSIS — I2510 Atherosclerotic heart disease of native coronary artery without angina pectoris: Secondary | ICD-10-CM

## 2010-12-20 DIAGNOSIS — R0789 Other chest pain: Secondary | ICD-10-CM

## 2010-12-20 MED ORDER — TECHNETIUM TC 99M TETROFOSMIN IV KIT
11.0000 | PACK | Freq: Once | INTRAVENOUS | Status: AC | PRN
  Administered 2010-12-20: 11 via INTRAVENOUS

## 2010-12-20 MED ORDER — TECHNETIUM TC 99M TETROFOSMIN IV KIT
33.0000 | PACK | Freq: Once | INTRAVENOUS | Status: AC | PRN
  Administered 2010-12-20: 33 via INTRAVENOUS

## 2010-12-20 MED ORDER — REGADENOSON 0.4 MG/5ML IV SOLN
0.4000 mg | Freq: Once | INTRAVENOUS | Status: AC
  Administered 2010-12-20: 0.4 mg via INTRAVENOUS

## 2010-12-20 NOTE — Progress Notes (Deleted)
[facility] SITE 3 NUCLEAR MED [address] [place] [address] [phone]  Cardiology Nuclear Med Study  [name] is a 77 y.o. female [id number] Apr 30, 1920   Nuclear Med Background Indication for Stress Test:  Evaluation for Ischemia History:  {CHL HISTORY STRESS TEST:21021012} Cardiac Risk Factors: Family History - CAD and Smoker  Symptoms:  {CHL SYMPTOMS STRESS TEST:21021013}   Nuclear Pre-Procedure Caffeine/Decaff Intake:  None NPO After: 5:00 pm but drinking H2O all pm   Lungs:  *** IV 0.9% NS with Angio Cath:  20g  IV Site: R Hand  IV Started by:  Cathlyn Parsons, RN  Chest Size (in):  38 Cup Size: DD  Height: 5\' 5"  (1.651 m)  Weight:  292 lb (132.45 kg)  BMI:  Body mass index is 48.59 kg/(m^2). Tech Comments:  NA    Nuclear Med Study 1 or 2 day study: {CHL 1 OR 2 DAY STUDY:21021019}  Stress Test Type:  {CHL STRESS TEST TYPE:21021018}  Reading MD: {CHL LB NUC READING XL:24401027}  Order Authorizing Provider:  ***  Resting Radionuclide: {CHL RESTING RADIONUCLIDE:21021021}  Resting Radionuclide Dose: *** mCi   Stress Radionuclide:  {CHL STRESS RADIONUCLIDE:21021022}  Stress Radionuclide Dose: *** mCi           Stress Protocol Rest HR: *** Stress HR: ***  Rest BP: *** Stress BP: ***  Exercise Time (min): {NA AND WILDCARD:21589} METS: {NA AND WILDCARD:21589}          Dose of Adenosine (mg):  {NA AND OZDGUYQI:34742} Dose of Lexiscan: {CHL CARD WILDCARD AND 0.4:21590} mg  Dose of Atropine (mg): {NA AND VZDGLOVF:64332} Dose of Dobutamine: {NA AND WILDCARD:21589} mcg/kg/min (at max HR)  Stress Test Technologist: {CHL LB STRESS TEST TECHNOLOGIST:21021024}  Nuclear Technologist:  {CHL LB NUCLEAR TECHNOLOGIST:21021025}     Rest Procedure:  {CHL REST PROCEDURE NUCLEAR:21021027} Rest ECG: {CHL REST RJJ:88416}  Stress Procedure:  {CHL STRESS PROCEDURE NUCLEAR:21021028} Stress ECG: {CHL CAR STRESS ECG:21561}  QPS Raw Data Images:  {CHL  RAW DATA IMAGES NUC:21021029} Stress Images:  {CHL STRESS IMAGES NUC:21021030} Rest Images:  {CHL REST IMAGES NUC:21021031} Subtraction (SDS):  {CHL SUBTRACTION (SDS) NUC:21021032} Transient Ischemic Dilatation (Normal <1.22):  *** Lung/Heart Ratio (Normal <0.45):  ***  Quantitative Gated Spect Images QGS EDV:  *** ml QGS ESV:  *** ml QGS cine images:  {CHL CARD QGS:21591:o} QGS EF: {CHL CARD STUDY NOT GATED:21592:o}  Impression Exercise Capacity:  {CHL EXERCISE CAPACITY NUC:21021037} BP Response:  {CHL BP RESPONSE NUC:21021038} Clinical Symptoms:  {CHL CLINICAL SYMPTOMS NUC:21021039} ECG Impression:  {CHL ECG IMPRESSION NUC:21021040} Comparison with Prior Nuclear Study: {CHL NUCLEAR STUDY COMPARISON:21562}  Overall Impression:  {CHL OVERALL IMPRESSION NUC:21021041}

## 2010-12-20 NOTE — Progress Notes (Signed)
[facility] 3 NUCLEAR MED [address] [place] [address] [phone]  Cardiology Nuclear Med Study  [name] is a 77 y.o. female [id number] 10-01-1920   Nuclear Med Background Indication for Stress Test:  Evaluation for Ischemia History:  '08 ION:GEXBMW; '09 Echo:EF=55-60%, moderate MR; '09 Stent-LAD; '11 Stent-RCA Cardiac Risk Factors: Hypertension, Carotid Bruit and Lipids  Symptoms:  Chest Pressure and DOE   Nuclear Pre-Procedure Caffeine/Decaff Intake:  None NPO After: 3:00 pm   Lungs:  Clear.  98% on RA. IV 0.9% NS with Angio Cath:  22g  IV Site: R Hand  IV Started by:  Cathlyn Parsons, RN  Chest Size (in):  36 Cup Size: C  Height: 5\' 4"  (1.626 m)  Weight:  142 lb (64.411 kg)  BMI:  Body mass index is 24.37 kg/(m^2). Tech Comments:  NA    Nuclear Med Study 1 or 2 day study: 1 day  Stress Test Type:  Treadmill/Lexiscan  Reading MD: Kristeen Miss, MD  Order Authorizing Provider:  Arvilla Meres, MD  Resting Radionuclide: Technetium 46m Tetrofosmin  Resting Radionuclide Dose: 11.0 mCi   Stress Radionuclide:  Technetium 72m Tetrofosmin  Stress Radionuclide Dose: 33.0 mCi           Stress Protocol Rest HR: 56 Stress HR: 92  Rest BP: 142/72 Stress BP: 131/72  Exercise Time (min): 2:00 METS: n/a   Predicted Max HR: 132 bpm % Max HR: 69.7 bpm Rate Pressure Product: 41324   Dose of Adenosine (mg):  n/a Dose of Lexiscan: 0.4 mg  Dose of Atropine (mg): n/a Dose of Dobutamine: n/a mcg/kg/min (at max HR)  Stress Test Technologist: Smiley Houseman, CMA-N  Nuclear Technologist:  Domenic Polite, CNMT     Rest Procedure:  Myocardial perfusion imaging was performed at rest 45 minutes following the intravenous administration of Technetium 65m Tetrofosmin.  Rest ECG: 1st degree AVB and occasional PVC's.  Stress Procedure:  The patient received IV Lexiscan 0.4 mg over 15-seconds with concurrent low level exercise and then Technetium 72m  Tetrofosmin was injected at 30-seconds while the patient continued walking one more minute.  There were no significant changes with Lexiscan, only occasional PVC's and PAC's.  She did c/o chest pain, 9/10 with infusion.  Quantitative spect images were obtained after a 45-minute delay.  Stress ECG: No significant change from baseline ECG  QPS Raw Data Images:  Normal; no motion artifact; normal heart/lung ratio. Stress Images:  Normal homogeneous uptake in all areas of the myocardium. Rest Images:  Normal homogeneous uptake in all areas of the myocardium. Subtraction (SDS):  Normal Transient Ischemic Dilatation (Normal <1.22):  0.81 Lung/Heart Ratio (Normal <0.45):  0.28  Quantitative Gated Spect Images QGS EDV:  46 ml QGS ESV:  6 ml QGS cine images:  NL LV Function; NL Wall Motion QGS EF: 88%  Impression Exercise Capacity:  Lexiscan with low level exercise. BP Response:  Normal blood pressure response. Clinical Symptoms:  No chest pain. ECG Impression:  No significant ST segment change suggestive of ischemia. Comparison with Prior Nuclear Study: No previous nuclear study performed  Overall Impression:  Normal stress nuclear study.  No evidence of ischemia.  Normal LV function.    [name], [name]., MD, [facility]     .

## 2010-12-21 NOTE — Progress Notes (Signed)
Pt was notified.  

## 2010-12-31 ENCOUNTER — Other Ambulatory Visit: Payer: Self-pay | Admitting: Internal Medicine

## 2011-01-04 LAB — CK TOTAL AND CKMB (NOT AT ARMC)
CK, MB: 1.5
Relative Index: INVALID
Total CK: 37

## 2011-01-04 LAB — CBC
HCT: 35.8 — ABNORMAL LOW
HCT: 39.5
Hemoglobin: 12.1
Hemoglobin: 13.3
MCHC: 33.7
MCHC: 33.8
MCV: 89.5
MCV: 89.8
Platelets: 217
Platelets: 226
RBC: 3.99
RBC: 4.41
RDW: 13.8
RDW: 13.8
WBC: 8.1
WBC: 8.4

## 2011-01-04 LAB — BASIC METABOLIC PANEL
BUN: 6
BUN: 8
CO2: 29
CO2: 29
Calcium: 8.8
Calcium: 9.2
Chloride: 97
Chloride: 98
Creatinine, Ser: 0.78
Creatinine, Ser: 0.79
GFR calc Af Amer: >60
GFR calc Af Amer: >60
GFR calc non Af Amer: >60
GFR calc non Af Amer: >60
Glucose, Bld: 101 — ABNORMAL HIGH
Glucose, Bld: 95
Potassium: 3.7
Potassium: 4.2
Sodium: 134 — ABNORMAL LOW
Sodium: 135

## 2011-01-04 LAB — DIFFERENTIAL
Basophils Absolute: 0
Basophils Relative: 0
Eosinophils Absolute: 0.3
Eosinophils Relative: 3
Lymphocytes Relative: 16
Lymphs Abs: 1.3
Monocytes Absolute: 0.5
Monocytes Relative: 6
Neutro Abs: 6.3
Neutrophils Relative %: 75

## 2011-01-04 LAB — APTT: aPTT: 32

## 2011-01-04 LAB — PROTIME-INR
INR: 0.9
Prothrombin Time: 12.3

## 2011-01-21 LAB — CBC
HCT: 40.5
Hemoglobin: 13.8
MCHC: 34.2
MCV: 91.5
Platelets: 272
RBC: 4.42
RDW: 13.8
WBC: 6.3

## 2011-01-21 LAB — DIFFERENTIAL
Basophils Absolute: 0
Basophils Relative: 0
Eosinophils Absolute: 0.3
Eosinophils Relative: 5
Lymphocytes Relative: 22
Lymphs Abs: 1.3
Monocytes Absolute: 0.5
Monocytes Relative: 9
Neutro Abs: 4.1
Neutrophils Relative %: 65

## 2011-01-21 LAB — POCT I-STAT CREATININE
Creatinine, Ser: 0.9
Operator id: 279831

## 2011-01-21 LAB — I-STAT 8, (EC8 V) (CONVERTED LAB)
Acid-Base Excess: 2
BUN: 12
Bicarbonate: 27.5 — ABNORMAL HIGH
Chloride: 103
Glucose, Bld: 92
HCT: 46
Hemoglobin: 15.6 — ABNORMAL HIGH
Operator id: 279831
Potassium: 4.2
Sodium: 135
TCO2: 29
pCO2, Ven: 45.4
pH, Ven: 7.391 — ABNORMAL HIGH

## 2011-01-21 LAB — CK TOTAL AND CKMB (NOT AT ARMC)
CK, MB: 1.6
CK, MB: 1.6
CK, MB: 2.1
Relative Index: INVALID
Relative Index: INVALID
Relative Index: INVALID
Total CK: 38
Total CK: 42
Total CK: 49

## 2011-01-21 LAB — LIPID PANEL
Cholesterol: 226 — ABNORMAL HIGH
HDL: 44
LDL Cholesterol: 146 — ABNORMAL HIGH
Total CHOL/HDL Ratio: 5.1
Triglycerides: 182 — ABNORMAL HIGH
VLDL: 36

## 2011-01-21 LAB — POCT CARDIAC MARKERS
CKMB, poc: 1.5
Myoglobin, poc: 70.6
Operator id: 279831
Troponin i, poc: <0.05

## 2011-01-21 LAB — TROPONIN I
Troponin I: 0.04
Troponin I: 0.04
Troponin I: 0.05

## 2011-01-21 LAB — D-DIMER, QUANTITATIVE: D-Dimer, Quant: <0.22

## 2011-01-21 LAB — TSH: TSH: 1.332

## 2011-04-11 ENCOUNTER — Telehealth: Payer: Self-pay | Admitting: Internal Medicine

## 2011-04-11 NOTE — Telephone Encounter (Signed)
Stress test was normal so i think unlikely to be related to CAD but can see me in January or come see Tereso Newcomer in next week or so.

## 2011-04-11 NOTE — Telephone Encounter (Signed)
N/A.  LMTC. 

## 2011-04-11 NOTE — Telephone Encounter (Signed)
New msg Pt said she is very fatigued, no pain. she said she wanted to talk to you about the stent she is having.

## 2011-04-11 NOTE — Telephone Encounter (Signed)
Pt states she "just doesn't feel well".  She states she is fatigued and is requesting an appt with Dr Gala Romney sooner than Feb.

## 2011-04-11 NOTE — Telephone Encounter (Signed)
Pt was scheduled on Tues, 04/26/11 at 12:00noon with Tereso Newcomer PA-C which is the first available appt.  She agrees.

## 2011-04-22 ENCOUNTER — Ambulatory Visit (INDEPENDENT_AMBULATORY_CARE_PROVIDER_SITE_OTHER): Payer: Medicare Other | Admitting: Physician Assistant

## 2011-04-22 ENCOUNTER — Encounter: Payer: Self-pay | Admitting: Physician Assistant

## 2011-04-22 VITALS — BP 132/70 | HR 63 | Resp 18 | Ht 64.0 in | Wt 142.1 lb

## 2011-04-22 DIAGNOSIS — K589 Irritable bowel syndrome without diarrhea: Secondary | ICD-10-CM

## 2011-04-22 DIAGNOSIS — R5381 Other malaise: Secondary | ICD-10-CM

## 2011-04-22 DIAGNOSIS — I2510 Atherosclerotic heart disease of native coronary artery without angina pectoris: Secondary | ICD-10-CM

## 2011-04-22 DIAGNOSIS — I10 Essential (primary) hypertension: Secondary | ICD-10-CM

## 2011-04-22 DIAGNOSIS — E039 Hypothyroidism, unspecified: Secondary | ICD-10-CM

## 2011-04-22 DIAGNOSIS — E785 Hyperlipidemia, unspecified: Secondary | ICD-10-CM

## 2011-04-22 DIAGNOSIS — R5383 Other fatigue: Secondary | ICD-10-CM

## 2011-04-22 LAB — CBC WITH DIFFERENTIAL/PLATELET
Basophils Absolute: 0 10*3/uL (ref 0.0–0.1)
Basophils Relative: 0.3 % (ref 0.0–3.0)
Eosinophils Absolute: 0.3 10*3/uL (ref 0.0–0.7)
Eosinophils Relative: 4.8 % (ref 0.0–5.0)
HCT: 38.8 % (ref 36.0–46.0)
Hemoglobin: 13.2 g/dL (ref 12.0–15.0)
Lymphocytes Relative: 29 % (ref 12.0–46.0)
Lymphs Abs: 1.8 10*3/uL (ref 0.7–4.0)
MCHC: 34 g/dL (ref 30.0–36.0)
MCV: 95.9 fl (ref 78.0–100.0)
Monocytes Absolute: 0.6 10*3/uL (ref 0.1–1.0)
Monocytes Relative: 9.5 % (ref 3.0–12.0)
Neutro Abs: 3.6 10*3/uL (ref 1.4–7.7)
Neutrophils Relative %: 56.4 % (ref 43.0–77.0)
Platelets: 228 10*3/uL (ref 150.0–400.0)
RBC: 4.05 Mil/uL (ref 3.87–5.11)
RDW: 13.6 % (ref 11.5–14.6)
WBC: 6.4 10*3/uL (ref 4.5–10.5)

## 2011-04-22 LAB — BASIC METABOLIC PANEL
BUN: 11 mg/dL (ref 6–23)
CO2: 29 mEq/L (ref 19–32)
Calcium: 9 mg/dL (ref 8.4–10.5)
Chloride: 100 mEq/L (ref 96–112)
Creatinine, Ser: 0.8 mg/dL (ref 0.4–1.2)
GFR: 76.25 mL/min (ref >60.00)
Glucose, Bld: 73 mg/dL (ref 70–99)
Potassium: 4.2 mEq/L (ref 3.5–5.1)
Sodium: 138 mEq/L (ref 135–145)

## 2011-04-22 LAB — TSH: TSH: 1.86 u[IU]/mL (ref 0.35–5.50)

## 2011-04-22 NOTE — Assessment & Plan Note (Signed)
Controlled.  Continue current therapy.  

## 2011-04-22 NOTE — Patient Instructions (Addendum)
Your physician recommends that you schedule a follow-up appointment in: 3 months with Dr Gala Romney Your physician recommends that you return for lab work in: today (BMP, CBC, TSH)

## 2011-04-22 NOTE — Assessment & Plan Note (Signed)
Managed by PCP

## 2011-04-22 NOTE — Assessment & Plan Note (Signed)
No angina.  Recent myoview low risk.  Continue ASA, Plavix and statin.  Follow up with Dr. Arvilla Meres in 3 mos.

## 2011-04-22 NOTE — Assessment & Plan Note (Signed)
Check TSH given fatigue.

## 2011-04-22 NOTE — Assessment & Plan Note (Signed)
I suspect her activity level is driving this.  She had a normal myoview recently.  She is not having any recurrent angina.  I do not think she needs any further cardiovascular testing.  Check BMET, TSH and CBC today.  Otherwise, no further testing.

## 2011-04-22 NOTE — Progress Notes (Signed)
[address] [place], [place]  [address] Phone: [phone] Fax:  [phone]  Date:  04/22/2011   Name:  MILLIANI HERRADA       DOB:  08/18/1920 MRN:  [id number]  PCP:  Dr. Ricki Miller  Primary Cardiologist:  Dr. Arvilla Meres  Primary Electrophysiologist:  None    History of Present Illness: ANNISA MAZZARELLA is a 78 y.o. female who presents for follow up.  She has a h/o severe HTN, IBS and CAD with history of PCI to the pLAD in 4/09 with a Palmaz DES and Promus DES to dRCA in 11/11.  Admitted in 11/11 for [place].  Underwent cath -revealing a 95% stenosis in the distal RCA. 70-80% stenosis in a small left circumflex and 70-80% stenosis in the mid LAD, patent stent in pLAD with 30% ISR. The RCA was stented with a 3.0 x 15 mm Promus drug-eluting stent.  EF is 70%.  Last Myoview 9/12: No ischemia, EF 88%.  Last echocardiogram 9/09: EF 55-60%, mild focal basal septal hypertrophy, moderate MR, mild LAE, mildly increased PAS P.  Last seen by Dr. Gala Romney 9/12.  Myoview was set up at that visit.  Plan was for 6 month followup.  She has felt dizzy and fatigued recently.  Describes symptoms that sound c/w vertigo from time to time.  She is quite busy.  She goes to Bible study and helps out at her church several days a week.  She goes bowling once a week.  She had multiple family members over to her house on Christmas and helped with the cooking.  She was exhausted for a few days after and made this appt.  She denies chest pain like her prior angina.  No significant dyspnea.  Denies orthopnea, PND or edema.  No syncope.    Past Medical History  Diagnosis Date  . CAD (coronary artery disease)   . HTN (hypertension)   . Hypothyroidism   . Diverticulosis   . IBS (irritable bowel syndrome)   . Carotid bruit   . HLD (hyperlipidemia)   . GERD (gastroesophageal reflux disease)   . Hemorrhoids     Current Outpatient Prescriptions  Medication Sig Dispense Refill  . Alum & Mag  Hydroxide-Simeth (ANTACID ANTI-GAS PO) Take by mouth.      Marland Kitchen amLODipine (NORVASC) 5 MG tablet Take 5 mg by mouth daily.        Marland Kitchen aspirin 81 MG tablet Take 1 tablet (81 mg total) by mouth daily.      . Calcium Carbonate-Vitamin D (CALCIUM 600+D) 600-400 MG-UNIT per tablet Take 1 tablet by mouth daily.        . clopidogrel (PLAVIX) 75 MG tablet TAKE 1 TABLET BY MOUTH EVERY DAY  30 tablet  8  . docusate sodium (COLACE) 100 MG capsule Take 100 mg by mouth 2 (two) times daily.      . enalapril (VASOTEC) 5 MG tablet TAKE 1 TABLET BY MOUTH TWICE A DAY  60 tablet  6  . levothyroxine (SYNTHROID, LEVOTHROID) 100 MCG tablet Take 100 mcg by mouth daily.        . Multiple Vitamin (MULTIVITAMIN) tablet Take 1 tablet by mouth daily.        . nitroGLYCERIN (NITROSTAT) 0.4 MG SL tablet Place 0.4 mg under the tongue every 5 (five) minutes as needed.        . simvastatin (ZOCOR) 20 MG tablet Take 20 mg by mouth at bedtime.        Marland Kitchen  tolterodine (DETROL LA) 2 MG 24 hr capsule Take 4 mg by mouth daily.          Allergies: No Known Allergies  History  Substance Use Topics  . Smoking status: Never Smoker   . Smokeless tobacco: Not on file  . Alcohol Use: No     ROS:  Please see the history of present illness.   She has a lot of alternating constipation and diarrhea and anal leakage from her IBS.  Also notes occasional nausea from this.  All other systems reviewed and negative.   PHYSICAL EXAM: VS:  BP 132/70  Pulse 63  Resp 18  Ht 5\' 4"  (1.626 m)  Wt 142 lb 1.9 oz (64.465 kg)  BMI 24.39 kg/m2 Well nourished, well developed, in no acute distress HEENT: normal Neck: no JVD Vascular: no carotid bruits Cardiac:  normal S1, S2; RRR; no murmur Lungs:  clear to auscultation bilaterally, no wheezing, rhonchi or rales Abd: soft, nontender, no hepatomegaly Ext: no edema Skin: warm and dry Neuro:  CNs 2-12 intact, no focal abnormalities noted Psych: normal affect  EKG:   Sinus rhythm, heart rate 63, normal  axis, PVC, nonspecific ST-T wave changes, no change from prior tracings.  ASSESSMENT AND PLAN:

## 2011-04-22 NOTE — Assessment & Plan Note (Signed)
She seems to have a great deal of symptoms related to this.

## 2011-04-26 ENCOUNTER — Ambulatory Visit: Payer: Medicare Other | Admitting: Physician Assistant

## 2011-05-09 ENCOUNTER — Other Ambulatory Visit: Payer: Self-pay | Admitting: Cardiovascular Disease

## 2011-06-09 ENCOUNTER — Telehealth: Payer: Self-pay | Admitting: Internal Medicine

## 2011-06-09 NOTE — Telephone Encounter (Signed)
New Msg: Pt calling to schedule appt for yearly ov. Please return pt call to discuss if Dr. Gala Romney wants to see pt in CHF clinic or if pt care needs to be transferred to another provider. Please return pt call to discuss further.

## 2011-06-09 NOTE — Telephone Encounter (Signed)
Do you want pt to transfer to a new cardiologist or should she be seen at the heart failure clinic?  Thanks.  Past Medical History   Diagnosis  Date   .  CAD (coronary artery disease)    .  HTN (hypertension)    .  Hypothyroidism    .  Diverticulosis    .  IBS (irritable bowel syndrome)    .  Carotid bruit    .  HLD (hyperlipidemia)

## 2011-06-11 NOTE — Telephone Encounter (Signed)
Please have her see someone else. Thanks.

## 2011-06-13 NOTE — Telephone Encounter (Signed)
Pt was notified.  

## 2011-07-28 ENCOUNTER — Ambulatory Visit: Payer: Medicare Other | Admitting: Cardiology

## 2011-08-01 ENCOUNTER — Ambulatory Visit (INDEPENDENT_AMBULATORY_CARE_PROVIDER_SITE_OTHER): Payer: Medicare Other | Admitting: Cardiology

## 2011-08-01 ENCOUNTER — Encounter: Payer: Self-pay | Admitting: Cardiology

## 2011-08-01 VITALS — BP 112/62 | HR 66 | Ht 65.0 in | Wt 142.0 lb

## 2011-08-01 DIAGNOSIS — E785 Hyperlipidemia, unspecified: Secondary | ICD-10-CM

## 2011-08-01 DIAGNOSIS — I10 Essential (primary) hypertension: Secondary | ICD-10-CM

## 2011-08-01 DIAGNOSIS — I2510 Atherosclerotic heart disease of native coronary artery without angina pectoris: Secondary | ICD-10-CM

## 2011-08-01 NOTE — Assessment & Plan Note (Signed)
Continue statin. Lipids and liver monitored by primary care. 

## 2011-08-01 NOTE — Progress Notes (Signed)
HPI: Pleasant female previously followed by Dr Gala Romney for follow up of CAD. She has a h/o severe HTN, IBS and CAD with history of PCI to the pLAD in 4/09 with a Palmaz DES and Promus DES to dRCA in 11/11. Admitted in 11/11 for [place]. Underwent cath -revealing a 95% stenosis in the distal RCA. 70-80% stenosis in a small left circumflex and 70-80% stenosis in the mid LAD, patent stent in pLAD with 30% ISR. The RCA was stented with a 3.0 x 15 mm Promus drug-eluting stent. EF is 70%. Last Myoview 9/12: No ischemia, EF 88%. Last echocardiogram 9/09: EF 55-60%, mild focal basal septal hypertrophy, moderate MR, mild LAE, mildly increased PAS P.  Last seen in Jan 2013 by Tereso Newcomer. Complained of fatigue; hgb and TSH normal. Since then, the patient has dyspnea with more extreme activities but not with routine activities. It is relieved with rest. It is not associated with chest pain. There is no orthopnea, PND or pedal edema. There is no syncope or palpitations. There is no exertional chest pain.   Current Outpatient Prescriptions  Medication Sig Dispense Refill  . Alum & Mag Hydroxide-Simeth (ANTACID ANTI-GAS PO) Take by mouth.      Marland Kitchen amLODipine (NORVASC) 5 MG tablet Take 5 mg by mouth daily.        Marland Kitchen aspirin 81 MG tablet Take 1 tablet (81 mg total) by mouth daily.      . Calcium Carbonate-Vitamin D (CALCIUM 600+D) 600-400 MG-UNIT per tablet Take 1 tablet by mouth daily.        . clopidogrel (PLAVIX) 75 MG tablet TAKE 1 TABLET BY MOUTH EVERY DAY  30 tablet  8  . enalapril (VASOTEC) 5 MG tablet TAKE 1 TABLET BY MOUTH TWICE A DAY  60 tablet  6  . levothyroxine (SYNTHROID, LEVOTHROID) 100 MCG tablet Take 100 mcg by mouth daily.        . metoprolol (LOPRESSOR) 100 MG tablet Take 100 mg by mouth 2 (two) times daily.      . Multiple Vitamin (MULTIVITAMIN) tablet Take 1 tablet by mouth daily.        . nitroGLYCERIN (NITROSTAT) 0.4 MG SL tablet Place 0.4 mg under the tongue every 5 (five) minutes as needed.         . simvastatin (ZOCOR) 20 MG tablet Take 20 mg by mouth at bedtime.        . vitamin B-12 (CYANOCOBALAMIN) 500 MCG tablet Take 500 mcg by mouth daily.      Marland Kitchen docusate sodium (COLACE) 100 MG capsule Take 100 mg by mouth 2 (two) times daily.      Marland Kitchen tolterodine (DETROL LA) 2 MG 24 hr capsule Take 4 mg by mouth daily.           Past Medical History  Diagnosis Date  . CAD (coronary artery disease)   . HTN (hypertension)   . Hypothyroidism   . Diverticulosis   . IBS (irritable bowel syndrome)   . Carotid bruit   . HLD (hyperlipidemia)   . GERD (gastroesophageal reflux disease)   . Hemorrhoids     Past Surgical History  Procedure Date  . Back surgery   . Hysterectomy - unknown type   . Rectocele repair     History   Social History  . Marital Status: Widowed    Spouse Name: N/A    Number of Children: 4  . Years of Education: N/A   Occupational History  . retired  Social History Main Topics  . Smoking status: Never Smoker   . Smokeless tobacco: Not on file  . Alcohol Use: No  . Drug Use: No  . Sexually Active: Not on file   Other Topics Concern  . Not on file   Social History Narrative  . No narrative on file    ROS: no fevers or chills, productive cough, hemoptysis, dysphasia, odynophagia, melena, hematochezia, dysuria, hematuria, rash, seizure activity, orthopnea, PND, pedal edema, claudication. Remaining systems are negative.  Physical Exam: Well-developed well-nourished in no acute distress.  Skin is warm and dry.  HEENT is normal.  Neck is supple. No thyromegaly.  Chest is clear to auscultation with normal expansion.  Cardiovascular exam is regular rate and rhythm.  Abdominal exam nontender or distended. No masses palpated. Extremities show no edema. neuro grossly intact

## 2011-08-01 NOTE — Assessment & Plan Note (Signed)
Continue aspirin and statin. It has been over one year since last stent. Discontinue Plavix.

## 2011-08-01 NOTE — Patient Instructions (Signed)
Please stop your Plavix Continue all other medications   Follow up in 6 months with Dr Shelda Pal will receive a letter in the mail 2 months before you are due.  Please call us when you receive this letter to schedule your follow up appointment.

## 2011-08-01 NOTE — Assessment & Plan Note (Signed)
Blood pressure controlled. Continue present medications. 

## 2011-12-05 ENCOUNTER — Other Ambulatory Visit: Payer: Self-pay | Admitting: Cardiovascular Disease

## 2012-02-07 ENCOUNTER — Telehealth: Payer: Self-pay | Admitting: Cardiology

## 2012-02-07 NOTE — Telephone Encounter (Signed)
Per Dr. Patty Sermons, DOD, pt. is advised to call 911 or have son drive her to [place]. Pt. verbalized understanding.

## 2012-02-07 NOTE — Telephone Encounter (Signed)
plz return call to patient [phone] regarding SOB, no chest pain at this time.

## 2012-02-12 ENCOUNTER — Inpatient Hospital Stay (HOSPITAL_COMMUNITY)
Admission: EM | Admit: 2012-02-12 | Discharge: 2012-02-15 | DRG: 287 | Disposition: A | Discharge status: Home / Self Care | Payer: Medicare Other | Attending: Cardiology | Admitting: Cardiology

## 2012-02-12 ENCOUNTER — Encounter (HOSPITAL_COMMUNITY): Payer: Self-pay | Admitting: Physical Medicine and Rehabilitation

## 2012-02-12 ENCOUNTER — Emergency Department (HOSPITAL_COMMUNITY): Payer: Medicare Other

## 2012-02-12 DIAGNOSIS — E039 Hypothyroidism, unspecified: Secondary | ICD-10-CM | POA: Diagnosis present

## 2012-02-12 DIAGNOSIS — R0989 Other specified symptoms and signs involving the circulatory and respiratory systems: Secondary | ICD-10-CM

## 2012-02-12 DIAGNOSIS — Z7982 Long term (current) use of aspirin: Secondary | ICD-10-CM

## 2012-02-12 DIAGNOSIS — Z9861 Coronary angioplasty status: Secondary | ICD-10-CM

## 2012-02-12 DIAGNOSIS — Z79899 Other long term (current) drug therapy: Secondary | ICD-10-CM

## 2012-02-12 DIAGNOSIS — I10 Essential (primary) hypertension: Secondary | ICD-10-CM | POA: Diagnosis present

## 2012-02-12 DIAGNOSIS — I2510 Atherosclerotic heart disease of native coronary artery without angina pectoris: Secondary | ICD-10-CM | POA: Diagnosis present

## 2012-02-12 DIAGNOSIS — E785 Hyperlipidemia, unspecified: Secondary | ICD-10-CM | POA: Diagnosis present

## 2012-02-12 DIAGNOSIS — I200 Unstable angina: Secondary | ICD-10-CM

## 2012-02-12 DIAGNOSIS — R079 Chest pain, unspecified: Secondary | ICD-10-CM | POA: Insufficient documentation

## 2012-02-12 DIAGNOSIS — K219 Gastro-esophageal reflux disease without esophagitis: Secondary | ICD-10-CM | POA: Diagnosis present

## 2012-02-12 DIAGNOSIS — K589 Irritable bowel syndrome without diarrhea: Secondary | ICD-10-CM | POA: Diagnosis present

## 2012-02-12 DIAGNOSIS — R0789 Other chest pain: Principal | ICD-10-CM | POA: Diagnosis present

## 2012-02-12 DIAGNOSIS — R5383 Other fatigue: Secondary | ICD-10-CM

## 2012-02-12 LAB — BASIC METABOLIC PANEL
BUN: 18 mg/dL (ref 6–23)
CO2: 29 mEq/L (ref 19–32)
Calcium: 9.6 mg/dL (ref 8.4–10.5)
Chloride: 100 mEq/L (ref 96–112)
Creatinine, Ser: 0.91 mg/dL (ref 0.50–1.10)
GFR calc Af Amer: 63 mL/min — ABNORMAL LOW (ref >90)
GFR calc non Af Amer: 54 mL/min — ABNORMAL LOW (ref >90)
Glucose, Bld: 103 mg/dL — ABNORMAL HIGH (ref 70–99)
Potassium: 4 mEq/L (ref 3.5–5.1)
Sodium: 137 mEq/L (ref 135–145)

## 2012-02-12 LAB — TROPONIN I
Troponin I: <0.3 ng/mL (ref <0.30)
Troponin I: <0.3 ng/mL (ref <0.30)

## 2012-02-12 LAB — CBC WITH DIFFERENTIAL/PLATELET
Basophils Absolute: 0 10*3/uL (ref 0.0–0.1)
Basophils Relative: 0 % (ref 0–1)
Eosinophils Absolute: 0.4 10*3/uL (ref 0.0–0.7)
Eosinophils Relative: 5 % (ref 0–5)
HCT: 43.9 % (ref 36.0–46.0)
Hemoglobin: 14.7 g/dL (ref 12.0–15.0)
Lymphocytes Relative: 36 % (ref 12–46)
Lymphs Abs: 2.7 10*3/uL (ref 0.7–4.0)
MCH: 31.1 pg (ref 26.0–34.0)
MCHC: 33.5 g/dL (ref 30.0–36.0)
MCV: 92.8 fL (ref 78.0–100.0)
Monocytes Absolute: 0.7 10*3/uL (ref 0.1–1.0)
Monocytes Relative: 9 % (ref 3–12)
Neutro Abs: 3.8 10*3/uL (ref 1.7–7.7)
Neutrophils Relative %: 50 % (ref 43–77)
Platelets: 258 10*3/uL (ref 150–400)
RBC: 4.73 MIL/uL (ref 3.87–5.11)
RDW: 12.4 % (ref 11.5–15.5)
WBC: 7.6 10*3/uL (ref 4.0–10.5)

## 2012-02-12 LAB — POCT I-STAT TROPONIN I: Troponin i, poc: 0 ng/mL (ref 0.00–0.08)

## 2012-02-12 MED ORDER — ASPIRIN 81 MG PO CHEW
324.0000 mg | CHEWABLE_TABLET | ORAL | Status: AC
  Filled 2012-02-12: qty 4

## 2012-02-12 MED ORDER — SODIUM CHLORIDE 0.9 % IJ SOLN: 3.0000 mL | INTRAMUSCULAR | Status: DC | PRN

## 2012-02-12 MED ORDER — ONDANSETRON HCL 4 MG/2ML IJ SOLN: 4.0000 mg | Freq: Four times a day (QID) | INTRAMUSCULAR | Status: DC | PRN

## 2012-02-12 MED ORDER — ADULT MULTIVITAMIN W/MINERALS CH
1.0000 | ORAL_TABLET | Freq: Every day | ORAL | Status: DC
  Administered 2012-02-13 – 2012-02-15 (×3): 1 via ORAL
  Filled 2012-02-12 (×3): qty 1

## 2012-02-12 MED ORDER — ZOLPIDEM TARTRATE 5 MG PO TABS
5.0000 mg | ORAL_TABLET | Freq: Every evening | ORAL | Status: DC | PRN
  Administered 2012-02-12 – 2012-02-14 (×2): 5 mg via ORAL
  Filled 2012-02-12 (×2): qty 1

## 2012-02-12 MED ORDER — DOCUSATE SODIUM 100 MG PO CAPS
100.0000 mg | ORAL_CAPSULE | Freq: Two times a day (BID) | ORAL | Status: DC
  Administered 2012-02-12 – 2012-02-13 (×2): 100 mg via ORAL
  Filled 2012-02-12 (×7): qty 1

## 2012-02-12 MED ORDER — NITROGLYCERIN 0.4 MG SL SUBL: 0.4000 mg | SUBLINGUAL_TABLET | SUBLINGUAL | Status: DC | PRN

## 2012-02-12 MED ORDER — ASPIRIN EC 81 MG PO TBEC
81.0000 mg | DELAYED_RELEASE_TABLET | Freq: Every day | ORAL | Status: DC
  Filled 2012-02-12: qty 1

## 2012-02-12 MED ORDER — CYANOCOBALAMIN 500 MCG PO TABS
500.0000 ug | ORAL_TABLET | Freq: Every day | ORAL | Status: DC
  Administered 2012-02-13 – 2012-02-15 (×3): 500 ug via ORAL
  Filled 2012-02-12 (×3): qty 1

## 2012-02-12 MED ORDER — ACETAMINOPHEN 325 MG PO TABS
650.0000 mg | ORAL_TABLET | ORAL | Status: DC | PRN
  Administered 2012-02-13: 650 mg via ORAL
  Filled 2012-02-12: qty 2

## 2012-02-12 MED ORDER — LEVOTHYROXINE SODIUM 100 MCG PO TABS
100.0000 ug | ORAL_TABLET | Freq: Every day | ORAL | Status: DC
  Administered 2012-02-13 – 2012-02-15 (×3): 100 ug via ORAL
  Filled 2012-02-12 (×4): qty 1

## 2012-02-12 MED ORDER — ENALAPRIL MALEATE 5 MG PO TABS
5.0000 mg | ORAL_TABLET | Freq: Two times a day (BID) | ORAL | Status: DC
  Administered 2012-02-12 – 2012-02-15 (×6): 5 mg via ORAL
  Filled 2012-02-12 (×7): qty 1

## 2012-02-12 MED ORDER — SODIUM CHLORIDE 0.9 % IJ SOLN
3.0000 mL | Freq: Two times a day (BID) | INTRAMUSCULAR | Status: DC
  Administered 2012-02-12 – 2012-02-14 (×4): 3 mL via INTRAVENOUS

## 2012-02-12 MED ORDER — CALCIUM CARBONATE-VITAMIN D 500-200 MG-UNIT PO TABS
1.0000 | ORAL_TABLET | Freq: Every day | ORAL | Status: DC
  Administered 2012-02-12 – 2012-02-15 (×4): 1 via ORAL
  Filled 2012-02-12 (×4): qty 1

## 2012-02-12 MED ORDER — METOPROLOL TARTRATE 100 MG PO TABS
100.0000 mg | ORAL_TABLET | Freq: Two times a day (BID) | ORAL | Status: DC
  Administered 2012-02-12 – 2012-02-15 (×6): 100 mg via ORAL
  Filled 2012-02-12 (×7): qty 1

## 2012-02-12 MED ORDER — ASPIRIN 81 MG PO CHEW
324.0000 mg | CHEWABLE_TABLET | ORAL | Status: AC
  Administered 2012-02-13: 324 mg via ORAL

## 2012-02-12 MED ORDER — VITAMIN B-12 500 MCG PO TABS: 500.0000 ug | ORAL_TABLET | Freq: Every day | ORAL | Status: DC

## 2012-02-12 MED ORDER — CALCIUM CARBONATE-VITAMIN D 600-400 MG-UNIT PO TABS: 1.0000 | ORAL_TABLET | Freq: Every day | ORAL | Status: DC

## 2012-02-12 MED ORDER — ONE-DAILY MULTI VITAMINS PO TABS: 1.0000 | ORAL_TABLET | Freq: Every day | ORAL | Status: DC

## 2012-02-12 MED ORDER — SODIUM CHLORIDE 0.9 % IV SOLN: INTRAVENOUS | Status: DC

## 2012-02-12 MED ORDER — HEPARIN BOLUS VIA INFUSION
3000.0000 [IU] | Freq: Once | INTRAVENOUS | Status: AC
  Administered 2012-02-12: 3000 [IU] via INTRAVENOUS
  Filled 2012-02-12: qty 3000

## 2012-02-12 MED ORDER — ASPIRIN 300 MG RE SUPP
300.0000 mg | RECTAL | Status: AC
  Filled 2012-02-12: qty 1

## 2012-02-12 MED ORDER — HEPARIN (PORCINE) IN NACL 100-0.45 UNIT/ML-% IJ SOLN
750.0000 [IU]/h | INTRAMUSCULAR | Status: DC
  Administered 2012-02-12: 750 [IU]/h via INTRAVENOUS
  Filled 2012-02-12 (×2): qty 250

## 2012-02-12 MED ORDER — SIMVASTATIN 20 MG PO TABS
20.0000 mg | ORAL_TABLET | Freq: Every day | ORAL | Status: DC
  Administered 2012-02-12 – 2012-02-14 (×3): 20 mg via ORAL
  Filled 2012-02-12 (×4): qty 1

## 2012-02-12 MED ORDER — ASPIRIN 81 MG PO CHEW
162.0000 mg | CHEWABLE_TABLET | Freq: Once | ORAL | Status: AC
  Administered 2012-02-12: 162 mg via ORAL
  Filled 2012-02-12: qty 2

## 2012-02-12 MED ORDER — AMLODIPINE BESYLATE 5 MG PO TABS
5.0000 mg | ORAL_TABLET | Freq: Every day | ORAL | Status: DC
  Administered 2012-02-13 – 2012-02-15 (×3): 5 mg via ORAL
  Filled 2012-02-12 (×3): qty 1

## 2012-02-12 MED ORDER — SODIUM CHLORIDE 0.9 % IV SOLN: 250.0000 mL | INTRAVENOUS | Status: DC | PRN

## 2012-02-12 MED ORDER — NITROGLYCERIN 2 % TD OINT
0.5000 [in_us] | TOPICAL_OINTMENT | Freq: Four times a day (QID) | TRANSDERMAL | Status: DC
  Administered 2012-02-12 – 2012-02-15 (×9): 0.5 [in_us] via TOPICAL
  Filled 2012-02-12: qty 30

## 2012-02-12 NOTE — ED Provider Notes (Signed)
History     CSN: [id number]  Arrival date & time 02/12/12  1458   First MD Initiated Contact with Patient 02/12/12 1545      Chief Complaint  Patient presents with  . Shortness of Breath  . Chest Pain    (Consider location/radiation/quality/duration/timing/severity/associated sxs/prior treatment) HPI Comments: Pt has a h/o severe HTN, IBS and CAD with history of PCI to the pLAD in 4/09 with a Palmaz DES and Promus DES to dRCA in 11/11. Pt comes in with cc of sob, chest discomfort. Pt reports that for the last 3 weeks she has been having some sob, unprovoked, with no specific aggravating or relieving factors. She also gets some chest heavyness/tightness - no chest pain, and about 2 days ago she took nitro with resolution of her sx. She has no n/v/f/c. No new cough, no orthopnea, PND, no UTI like sx. Pt called Cardiology clinic, and was asked to come to the ER.   Patient is a 78 y.o. female presenting with shortness of breath and chest pain. The history is provided by the patient.  Shortness of Breath  Associated symptoms include shortness of breath. Pertinent negatives include no chest pain, no cough and no wheezing.  Chest Pain Primary symptoms include shortness of breath and dizziness. Pertinent negatives for primary symptoms include no cough, no wheezing, no palpitations, no abdominal pain, no nausea and no vomiting.  Dizziness does not occur with nausea or vomiting.     Past Medical History  Diagnosis Date  . CAD (coronary artery disease)   . HTN (hypertension)   . Hypothyroidism   . Diverticulosis   . IBS (irritable bowel syndrome)   . Carotid bruit   . HLD (hyperlipidemia)   . GERD (gastroesophageal reflux disease)   . Hemorrhoids     Past Surgical History  Procedure Date  . Back surgery   . Hysterectomy - unknown type   . Rectocele repair   . Coronary angioplasty with stent placement     Family History  Problem Relation Age of Onset  . Myasthenia gravis      . Hip fracture    . Heart disease      History  Substance Use Topics  . Smoking status: Never Smoker   . Smokeless tobacco: Not on file  . Alcohol Use: No    OB History    Grav Para Term Preterm Abortions TAB SAB Ect Mult Living                  Review of Systems  Constitutional: Negative for activity change.  HENT: Negative for facial swelling and neck pain.   Respiratory: Positive for chest tightness and shortness of breath. Negative for cough and wheezing.   Cardiovascular: Negative for chest pain, palpitations and leg swelling.  Gastrointestinal: Negative for nausea, vomiting, abdominal pain, diarrhea, constipation, blood in stool and abdominal distention.  Genitourinary: Negative for hematuria and difficulty urinating.  Skin: Negative for color change.  Neurological: Positive for dizziness and light-headedness. Negative for speech difficulty.  Hematological: Does not bruise/bleed easily.  Psychiatric/Behavioral: Negative for confusion.    Allergies  Review of patient's allergies indicates no known allergies.  Home Medications   Current Outpatient Rx  Name  Route  Sig  Dispense  Refill  . ANTACID ANTI-GAS PO   Oral   Take 1 tablet by mouth as needed. For heartburn/gas         . AMLODIPINE BESYLATE 5 MG PO TABS   Oral  Take 5 mg by mouth daily.           . ASPIRIN 81 MG PO TABS   Oral   Take 1 tablet (81 mg total) by mouth daily.         Marland Kitchen CALCIUM CARBONATE-VITAMIN D 600-400 MG-UNIT PO TABS   Oral   Take 1 tablet by mouth daily.           Marland Kitchen DOCUSATE SODIUM 100 MG PO CAPS   Oral   Take 100 mg by mouth 2 (two) times daily.         . ENALAPRIL MALEATE 5 MG PO TABS      TAKE 1 TABLET BY MOUTH TWICE A DAY   60 tablet   9   . LEVOTHYROXINE SODIUM 100 MCG PO TABS   Oral   Take 100 mcg by mouth daily.           Marland Kitchen METOPROLOL TARTRATE 100 MG PO TABS   Oral   Take 100 mg by mouth 2 (two) times daily.         Marland Kitchen ONE-DAILY MULTI VITAMINS PO  TABS   Oral   Take 1 tablet by mouth daily.           Marland Kitchen NITROGLYCERIN 0.4 MG SL SUBL   Sublingual   Place 0.4 mg under the tongue every 5 (five) minutes as needed. For chest pain         . SIMVASTATIN 20 MG PO TABS   Oral   Take 20 mg by mouth at bedtime.           . TOLTERODINE TARTRATE ER 2 MG PO CP24   Oral   Take 4 mg by mouth daily.           Marland Kitchen VITAMIN B-12 500 MCG PO TABS   Oral   Take 500 mcg by mouth daily.           BP 174/55  Pulse 64  Temp 97.5 F (36.4 C) (Oral)  SpO2 98%  Physical Exam  Nursing note and vitals reviewed. Constitutional: She is oriented to person, place, and time. She appears well-developed and well-nourished.  HENT:  Head: Normocephalic and atraumatic.  Eyes: EOM are normal. Pupils are equal, round, and reactive to light.  Neck: Neck supple. No JVD present.  Cardiovascular: Normal rate.   No murmur heard.      Poor heart sounds, didn't appreciate any murmurs  Pulmonary/Chest: Effort normal. No respiratory distress.  Abdominal: Soft. She exhibits no distension. There is no tenderness. There is no rebound and no guarding.  Musculoskeletal: She exhibits no edema and no tenderness.  Neurological: She is alert and oriented to person, place, and time.  Skin: Skin is warm and dry.    ED Course  Procedures (including critical care time)  Labs Reviewed  BASIC METABOLIC PANEL - Abnormal; Notable for the following:    Glucose, Bld 103 (*)     GFR calc non Af Amer 54 (*)     GFR calc Af Amer 63 (*)     All other components within normal limits  CBC WITH DIFFERENTIAL  POCT I-STAT TROPONIN I  TROPONIN I   Dg Chest 2 View  02/12/2012  *RADIOLOGY REPORT*  Clinical Data: Shortness of breath and chest pain.  CHEST - 2 VIEW  Comparison: Chest x-ray 11/30/2006.  Findings: Lung volumes are normal.  No consolidative airspace disease.  No pleural effusions.  No pneumothorax.  No pulmonary nodule  or mass noted.  Pulmonary vasculature and the  cardiomediastinal silhouette are within normal limits.  Eventration of the right hemidiaphragm (unchanged).  Atherosclerosis in the thoracic aorta.  IMPRESSION: 1.  No radiographic evidence of acute cardiopulmonary disease. 2.  Atherosclerosis. 3.  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   Original Report Authenticated By: Trudie Reed, M.D.      No diagnosis found.    MDM   Date: 02/12/2012  Rate: 66  Rhythm: normal sinus rhythm  QRS Axis: normal  Intervals: PR prolonged  ST/T Wave abnormalities: nonspecific ST changes - mld slurring of qrs in the lateral leads  Conduction Disutrbances:first-degree A-V block   Narrative Interpretation:   Old EKG Reviewed: unchanged  Differential diagnosis includes: ACS syndrome CHF exacerbation Valvular disorder Myocarditis Pericarditis Pericardial effusion Pneumonia Pleural effusion Pulmonary edema PE Anemia Musculoskeletal pain  Pt comes in with cc of sob, chest discomfort. She has hx of CAD. Sx have been persistent x 3 weeks. With no lung hx, and normal lung exam and CXR, cardiovascular etiology likely if all the labs are normal, and the SOB can be angina equivalent.  Will get basic labs and consult Cardiology.         Derwood Kaplan, MD 02/12/12 1640

## 2012-02-12 NOTE — ED Notes (Signed)
Pt was transported to radiology before taken to room.  Patient is not in room at this time.

## 2012-02-12 NOTE — Progress Notes (Signed)
ANTICOAGULATION CONSULT NOTE - Initial Consult  Pharmacy Consult for Heparin Indication: chest pain/ACS  No Known Allergies  Patient Measurements: Height: 5' 5.5" (166.4 cm) Weight: 134 lb 14.7 oz (61.2 kg) IBW/kg (Calculated) : 58.15   Vital Signs: Temp: 97.5 F (36.4 C) (11/03 1626) Temp src: Oral (11/03 1626) BP: 132/91 mmHg (11/03 1800) Pulse Rate: 58  (11/03 1800)  Labs:  Basename 02/12/12 1751 02/12/12 1510  HGB -- 14.7  HCT -- 43.9  PLT -- 258  APTT -- --  LABPROT -- --  INR -- --  HEPARINUNFRC -- --  CREATININE -- 0.91  CKTOTAL -- --  CKMB -- --  TROPONINI <0.30 --    Estimated Creatinine Clearance: 38.5 ml/min (by C-G formula based on Cr of 0.91).   Medical History: Past Medical History  Diagnosis Date  . CAD (coronary artery disease)     a. 07/2007 PCI/Promus DES to LAD;  b. 02/2010 PCI/Promus DES to dRCA.  Residual - LCX 70-80 (small), LAD 70-43m, patent stent in pLAD w/ 30% ISR, EF 70%;  c. 12/2010 Lexi MV: EF 88%, no isch/infarct.  Marland Kitchen HTN (hypertension)   . Hypothyroidism   . Diverticulosis   . IBS (irritable bowel syndrome)   . Carotid bruit   . HLD (hyperlipidemia)   . GERD (gastroesophageal reflux disease)   . Hemorrhoids    Assessment: [name] is an 78 year old female to be started heparin for chest pain/acs. Noted troponins negative. CBC is wnl.   Goal of Therapy:  Heparin level 0.3-0.7 units/ml Monitor platelets by anticoagulation protocol: Yes   Plan:  Heparin bolus 3000 units Then heparin drip at 750 units/hr F/u with 8 hr HL Daily HL and CBC   Thank you,  Brett Fairy, PharmD 02/12/2012 8:32 PM

## 2012-02-12 NOTE — H&P (Signed)
Patient ID: [name] MRN: [id number], DOB/AGE: [birth year]   Admit date: 02/12/2012   Primary Physician: Juline Patch, MD Primary Cardiologist: B. Jens Som, MD  Pt. Profile:  78 y/o female with h/o CAD who presents to the ED with chest pain.  Problem List  Past Medical History  Diagnosis Date  . CAD (coronary artery disease)     a. 07/2007 PCI/Promus DES to LAD;  b. 02/2010 PCI/Promus DES to dRCA.  Residual - LCX 70-80 (small), LAD 70-49m, patent stent in pLAD w/ 30% ISR, EF 70%;  c. 12/2010 Lexi MV: EF 88%, no isch/infarct.  Marland Kitchen HTN (hypertension)   . Hypothyroidism   . Diverticulosis   . IBS (irritable bowel syndrome)   . Carotid bruit   . HLD (hyperlipidemia)   . GERD (gastroesophageal reflux disease)   . Hemorrhoids     Past Surgical History  Procedure Date  . Back surgery   . Hysterectomy - unknown type   . Rectocele repair   . Coronary angioplasty with stent placement     Allergies  No Known Allergies  HPI  78 y/o female with h/o CAD as outlined above.  She had been doing well but approx 3 wks ago, she began to experience progressive DOE and exertional chest pain along with an episode of nitrate responsive chest pain @ rest early last week.  At that time she called the office and was advised to present to the ED, but didn't.  Since then, Ss have persisted intermittently and she has felt generally fatigued.  She spoke with her grand-daughters today and they brought her into the ED.  Here, she is pain free and CE are initially normal.  ECG is non-acute.  Home Medications  Prior to Admission medications   Medication Sig Start Date End Date Taking? Authorizing Provider  Alum & Mag Hydroxide-Simeth (ANTACID ANTI-GAS PO) Take 1 tablet by mouth as needed. For heartburn/gas   Yes Historical Provider, MD  amLODipine (NORVASC) 5 MG tablet Take 5 mg by mouth daily.     Yes Historical Provider, MD  aspirin 81 MG tablet Take 1 tablet (81 mg total) by mouth daily. 12/03/10   Yes Dolores Patty, MD  Calcium Carbonate-Vitamin D (CALCIUM 600+D) 600-400 MG-UNIT per tablet Take 1 tablet by mouth daily.     Yes Historical Provider, MD  docusate sodium (COLACE) 100 MG capsule Take 100 mg by mouth 2 (two) times daily.   Yes Historical Provider, MD  enalapril (VASOTEC) 5 MG tablet TAKE 1 TABLET BY MOUTH TWICE A DAY 12/05/11  Yes Tonny Bollman, MD  levothyroxine (SYNTHROID, LEVOTHROID) 100 MCG tablet Take 100 mcg by mouth daily.     Yes Historical Provider, MD  metoprolol (LOPRESSOR) 100 MG tablet Take 100 mg by mouth 2 (two) times daily.   Yes Historical Provider, MD  Multiple Vitamin (MULTIVITAMIN) tablet Take 1 tablet by mouth daily.     Yes Historical Provider, MD  nitroGLYCERIN (NITROSTAT) 0.4 MG SL tablet Place 0.4 mg under the tongue every 5 (five) minutes as needed. For chest pain   Yes Historical Provider, MD  simvastatin (ZOCOR) 20 MG tablet Take 20 mg by mouth at bedtime.     Yes Historical Provider, MD  tolterodine (DETROL LA) 2 MG 24 hr capsule Take 4 mg by mouth daily.     Yes Historical Provider, MD  vitamin B-12 (CYANOCOBALAMIN) 500 MCG tablet Take 500 mcg by mouth daily.   Yes Historical Provider, MD   Family History  Family History  Problem Relation Age of Onset  . Myasthenia gravis    . Hip fracture    . Heart disease      Social History  History   Social History  . Marital Status: Widowed    Spouse Name: N/A    Number of Children: 4  . Years of Education: N/A   Occupational History  . retired    Social History Main Topics  . Smoking status: Never Smoker   . Smokeless tobacco: Not on file  . Alcohol Use: No  . Drug Use: No  . Sexually Active: Not on file   Other Topics Concern  . Not on file   Social History Narrative   Lives in [place].     Review of Systems General:  No chills, fever, night sweats or weight changes.  Cardiovascular:  +++ chest pain and dyspnea on exertion.  no edema, orthopnea, palpitations, paroxysmal  nocturnal dyspnea. Dermatological: No rash, lesions/masses Respiratory: No cough, dyspnea Urologic: No hematuria, dysuria Abdominal:   No nausea, vomiting, diarrhea, bright red blood per rectum, melena, or hematemesis.  Chronic constipation. Neurologic:  No visual changes, wkns, changes in mental status. All other systems reviewed and are otherwise negative except as noted above.  Physical Exam  Blood pressure 174/55, pulse 64, temperature 97.5 F (36.4 C), temperature source Oral, SpO2 98.00%.  General: Pleasant, NAD Psych: Normal affect. Neuro: Alert and oriented X 3. Moves all extremities spontaneously. HEENT: Normal  Neck: Supple without bruits or JVD. Lungs:  Resp regular and unlabored, CTA. Heart: RRR no s3, s4, or murmurs. Abdomen: Soft, non-tender, non-distended, BS + x 4.  Extremities: No clubbing, cyanosis or edema. DP/PT/Radials 2+ and equal bilaterally.  Labs  Ti 0.00  Lab Results  Component Value Date   WBC 7.6 02/12/2012   HGB 14.7 02/12/2012   HCT 43.9 02/12/2012   MCV 92.8 02/12/2012   PLT 258 02/12/2012    Lab 02/12/12 1510  NA 137  K 4.0  CL 100  CO2 29  BUN 18  CREATININE 0.91  CALCIUM 9.6  PROT --  BILITOT --  ALKPHOS --  ALT --  AST --  GLUCOSE 103*   Radiology/Studies  Dg Chest 2 View  02/12/2012  *RADIOLOGY REPORT*  Clinical Data: Shortness of breath and chest pain.  CHEST - 2 VIEW  Comparison: Chest x-ray 11/30/2006.  Findings: Lung volumes are normal.  No consolidative airspace disease.  No pleural effusions.  No pneumothorax.  No pulmonary nodule or mass noted.  Pulmonary vasculature and the cardiomediastinal silhouette are within normal limits.  Eventration of the right hemidiaphragm (unchanged).  Atherosclerosis in the thoracic aorta.  IMPRESSION: 1.  No radiographic evidence of acute cardiopulmonary disease. 2.  Atherosclerosis. 3.  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   Original Report Authenticated By: Trudie Reed, M.D.    ECG  Rsr, 66, 1st deg avb.  ASSESSMENT AND PLAN  1.  USA/CAD:  Pt presents with a 2-3 wk h/o progressive fatigue, wkns, doe, and rest and exertional chest pain, which has been nitrate responsive.  She is currently pain free.  ECG is non-acute and initial troponin is nl.  Plan to admit, cycle CE, and perform diagnostic cath tomorrow.  Given h/o rest Ss, will add heparin and ntp.  OTW cont home meds.  Of note, plavix was d/c'd earlier this year as it had been > 18 mos since last stent.  2.  HTN:  BP up in ER.  Cont home meds and adjust as needed.  3.  HL:  Check lipids/lft's.  Cont statin.  4.  Hypothyroidism:  Cont home dose of synthroid.  Signed, Nicolasa Ducking, NP 02/12/2012, 4:52 PM  Attending note:  Patient seen and examined. Reviewed records and database. She is a former patient of Dr. Gala Romney, seen by Dr. Jens Som in the office back in March. History includes DES to the LAD in 2009, DES to the RCA in 2011, and residual 70-80% circumflex stenosis that has been managed medically. Last cardiac catheterization was in 2011 followed by a low risk Cardiolite in September 2012. She was taken off Plavix earlier this year.  She was brought to the ER today by her granddaughters reporting a three-week history of progressive dyspnea on exertion as well as chest "heaviness", nitroglycerin responsive. She states symptoms are similar to prior angina preceding coronary intervention.  She is comfortable on examination, afebrile, heart rate in the 60s and sinus rhythm, blood pressure 174/55. Lungs are clear, cardiac exam without gallop. Initial troponin is normal. Creatinine 0.9, hemoglobin 14.7, platelets 258. Chest x-ray shows no edema, and her ECG is without acute ST segment changes.  Symptoms concerning for unstable angina, reminiscent of previous symptoms prior to coronary intervention. Plan is admission to the hospital, cycle cardiac markers, optimize medical therapy, and  willl tentatively place her on the cardiac catheterization scheduled for tomorrow pending Dr. Ludwig Clarks approval.  Jonelle Sidle, M.D., F.A.C.C.

## 2012-02-12 NOTE — ED Notes (Signed)
MD at bedside. 

## 2012-02-12 NOTE — ED Notes (Addendum)
Pt presents to department for evaluation of SOB and midsternal non radiating chest pain. Pt states SOB increases on exertion. States 3/10 chest pressure at the time, states CP becomes worse with ambulation. She states "I feel tired all the time, more than usual." pt is conscious alert and oriented x4. Respirations unlabored.

## 2012-02-13 ENCOUNTER — Encounter (HOSPITAL_COMMUNITY)
Admission: EM | Disposition: A | Discharge status: Home / Self Care | Payer: Self-pay | Source: Home / Self Care | Attending: Cardiology

## 2012-02-13 DIAGNOSIS — I2510 Atherosclerotic heart disease of native coronary artery without angina pectoris: Secondary | ICD-10-CM

## 2012-02-13 HISTORY — PX: LEFT HEART CATHETERIZATION WITH CORONARY ANGIOGRAM: SHX5451

## 2012-02-13 LAB — CBC
HCT: 35.4 % — ABNORMAL LOW (ref 36.0–46.0)
HCT: 39 % (ref 36.0–46.0)
Hemoglobin: 11.9 g/dL — ABNORMAL LOW (ref 12.0–15.0)
Hemoglobin: 13.1 g/dL (ref 12.0–15.0)
MCH: 30.7 pg (ref 26.0–34.0)
MCH: 30.7 pg (ref 26.0–34.0)
MCHC: 33.6 g/dL (ref 30.0–36.0)
MCHC: 33.6 g/dL (ref 30.0–36.0)
MCV: 91.5 fL (ref 78.0–100.0)
MCV: 91.3 fL (ref 78.0–100.0)
Platelets: 200 10*3/uL (ref 150–400)
Platelets: 163 10*3/uL (ref 150–400)
RBC: 3.87 MIL/uL (ref 3.87–5.11)
RBC: 4.27 MIL/uL (ref 3.87–5.11)
RDW: 12.4 % (ref 11.5–15.5)
RDW: 12.8 % (ref 11.5–15.5)
WBC: 7.5 10*3/uL (ref 4.0–10.5)
WBC: 8.3 10*3/uL (ref 4.0–10.5)

## 2012-02-13 LAB — URINALYSIS, ROUTINE W REFLEX MICROSCOPIC
Bilirubin Urine: NEGATIVE
Glucose, UA: NEGATIVE mg/dL
Hgb urine dipstick: NEGATIVE
Ketones, ur: NEGATIVE mg/dL
Nitrite: NEGATIVE
Protein, ur: NEGATIVE mg/dL
Specific Gravity, Urine: 1.012 (ref 1.005–1.030)
Urobilinogen, UA: 0.2 mg/dL (ref 0.0–1.0)
pH: 7 (ref 5.0–8.0)

## 2012-02-13 LAB — TROPONIN I
Troponin I: <0.3 ng/mL (ref <0.30)
Troponin I: <0.3 ng/mL (ref <0.30)

## 2012-02-13 LAB — LIPID PANEL
Cholesterol: 135 mg/dL (ref 0–200)
HDL: 42 mg/dL (ref >39)
LDL Cholesterol: 68 mg/dL (ref 0–99)
Total CHOL/HDL Ratio: 3.2 RATIO
Triglycerides: 123 mg/dL (ref <150)
VLDL: 25 mg/dL (ref 0–40)

## 2012-02-13 LAB — D-DIMER, QUANTITATIVE: D-Dimer, Quant: 0.54 ug/mL-FEU — ABNORMAL HIGH (ref 0.00–0.48)

## 2012-02-13 LAB — OCCULT BLOOD X 1 CARD TO LAB, STOOL: Fecal Occult Bld: NEGATIVE

## 2012-02-13 LAB — HEPARIN LEVEL (UNFRACTIONATED)
Heparin Unfractionated: 0.26 IU/mL — ABNORMAL LOW (ref 0.30–0.70)
Heparin Unfractionated: 0.35 IU/mL (ref 0.30–0.70)

## 2012-02-13 LAB — URINE MICROSCOPIC-ADD ON

## 2012-02-13 LAB — TSH: TSH: 1.581 u[IU]/mL (ref 0.350–4.500)

## 2012-02-13 LAB — PROTIME-INR
INR: 1.08 (ref 0.00–1.49)
Prothrombin Time: 13.9 seconds (ref 11.6–15.2)

## 2012-02-13 LAB — POCT ACTIVATED CLOTTING TIME: Activated Clotting Time: 115 seconds

## 2012-02-13 SURGERY — LEFT HEART CATHETERIZATION WITH CORONARY ANGIOGRAM
Anesthesia: LOCAL

## 2012-02-13 MED ORDER — ACETAMINOPHEN 325 MG PO TABS: 650.0000 mg | ORAL_TABLET | ORAL | Status: DC | PRN

## 2012-02-13 MED ORDER — ASPIRIN EC 325 MG PO TBEC
325.0000 mg | DELAYED_RELEASE_TABLET | Freq: Every day | ORAL | Status: DC
  Administered 2012-02-14 – 2012-02-15 (×2): 325 mg via ORAL
  Filled 2012-02-13 (×2): qty 1

## 2012-02-13 MED ORDER — DIAZEPAM 2 MG PO TABS: 2.0000 mg | ORAL_TABLET | ORAL | Status: DC | PRN

## 2012-02-13 MED ORDER — NITROGLYCERIN 0.2 MG/ML ON CALL CATH LAB
INTRAVENOUS | Status: AC
  Filled 2012-02-13: qty 1

## 2012-02-13 MED ORDER — OXYCODONE-ACETAMINOPHEN 5-325 MG PO TABS: 1.0000 | ORAL_TABLET | ORAL | Status: DC | PRN

## 2012-02-13 MED ORDER — LIDOCAINE HCL (PF) 1 % IJ SOLN
INTRAMUSCULAR | Status: AC
  Filled 2012-02-13: qty 30

## 2012-02-13 MED ORDER — ONDANSETRON HCL 4 MG/2ML IJ SOLN: 4.0000 mg | Freq: Four times a day (QID) | INTRAMUSCULAR | Status: DC | PRN

## 2012-02-13 MED ORDER — SODIUM CHLORIDE 0.45 % IV SOLN
INTRAVENOUS | Status: AC
  Administered 2012-02-13: 15:00:00 via INTRAVENOUS

## 2012-02-13 MED ORDER — HEPARIN (PORCINE) IN NACL 2-0.9 UNIT/ML-% IJ SOLN
INTRAMUSCULAR | Status: AC
  Filled 2012-02-13: qty 1000

## 2012-02-13 NOTE — CV Procedure (Signed)
      Catheterization   Indication:  Chest pain and dyspnea  Procedure: After informed consent and clinical "time out" the right groin was prepped and draped in a sterile fashion.  A 5Fr sheath was placed in the right femoral artery using seldinger technique and local lidocaine.  Standard JL4, JR4 and angled pigtail catheters were used to engage the coronary arteries.  Coronary arteries were visualized in orthogonal views using caudal and cranial angulation.  RAO ventriculography was done using 25 * cc of contrast.    Medications:   Versed: 0 mg's  Fentanyl: 0 ug's  Coronary Arteries: Right dominant with no anomalies  LM: Normal  LAD: 20% proximal  Widely patent stent to mid vessel 60% distal disease    D1: small and 70% ostial disease   D2 small and diffuse beaded distal disease  Circumflex: Small vessel with primarily one AV groove OM1 brand  Diffuse 80% disease in mid vessel Similar in appearance To cath done by Dr Juanda Chance in 2011    RCA: Calcified ostium No significant lesion on cusp injection Some catheter tip spasm with no torque.  40% ostial lesiion Widely patent distal stent.     PDA: normal  PLA: normal  Ventriculography: EF: 75% %, hyperdynamic with no RWMA;s  Hemodynamics:  Aortic Pressure:  154 64 mmHg  LV Pressure:  140 19  mmHg  Impression:  She has some disease in sub 2mm vessels OM and diagonal  These look similar to 2011 and not likely to benefit  From intervention. Two stents are widely patent.  Primary symptom is dyspnea not chest pain.  EF hyperdynamic and LVEDP Not terribly high. Continued medical Rx warranted.  Charlton Haws 02/13/2012 2:40 PM

## 2012-02-13 NOTE — Progress Notes (Signed)
Report of elevated D-dimer called to Ward Givens, NP. Will continue to monitor. Levonne Spiller, RN

## 2012-02-13 NOTE — Progress Notes (Signed)
   Subjective:  Denies CP or dyspnea   Objective:  Filed Vitals:   02/12/12 1800 02/12/12 1900 02/12/12 2130 02/13/12 0600  BP: 132/91  134/70 177/74  Pulse: 58  69 71  Temp:   97.7 F (36.5 C) 97.5 F (36.4 C)  TempSrc:      Resp: 21  18 16   Height:  5' 5.5" (1.664 m) 5' 5.5" (1.664 m)   Weight:  134 lb 14.7 oz (61.2 kg) 134 lb 14.7 oz (61.2 kg)   SpO2: 97%  94% 95%    Intake/Output from previous day:  Intake/Output Summary (Last 24 hours) at 02/13/12 0750 Last data filed at 02/13/12 0700  Gross per 24 hour  Intake 244.51 ml  Output      0 ml  Net 244.51 ml    Physical Exam: Physical exam: Well-developed well-nourished in no acute distress.  Skin is warm and dry.  HEENT is normal.  Neck is supple.  Chest is clear to auscultation with normal expansion.  Cardiovascular exam is regular rate and rhythm.  Abdominal exam nontender or distended. No masses palpated. Extremities show no edema. neuro grossly intact  Rectal  - dark stool; no blood; hemocult sent to lab  Lab Results: Basic Metabolic Panel:  [facility] 02/12/12 1510  NA 137  K 4.0  CL 100  CO2 29  GLUCOSE 103*  BUN 18  CREATININE 0.91  CALCIUM 9.6  MG --  PHOS --   CBC:  Basename 02/13/12 0116 02/12/12 1510  WBC 7.5 7.6  NEUTROABS -- 3.8  HGB 11.9* 14.7  HCT 35.4* 43.9  MCV 91.5 92.8  PLT 200 258   Cardiac Enzymes:  Basename 02/13/12 0116 02/12/12 2145 02/12/12 1751  CKTOTAL -- -- --  CKMB -- -- --  CKMBINDEX -- -- --  TROPONINI <0.30 <0.30 <0.30     Assessment/Plan:  1 UA - Plan continue present meds; note hgb drop; hemocult sent to lab with results pending; no blood on rectal; recheck CBC; if Hgb stable, proceed with cath today (risks and benefits discussed and patient agrees to proceed). 2 HTN - continue present meds 3 Hyperlipidemia - continue statin  Olga Millers 02/13/2012, 7:50 AM

## 2012-02-13 NOTE — Interval H&P Note (Signed)
History and Physical Interval Note:  02/13/2012 2:21 PM  [name]  has presented today for surgery, with the diagnosis of cp  The various methods of treatment have been discussed with the patient and family. After consideration of risks, benefits and other options for treatment, the patient has consented to  Procedure(s) (LRB) with comments: LEFT HEART CATHETERIZATION WITH CORONARY ANGIOGRAM (N/A) as a surgical intervention .  The patient's history has been reviewed, patient examined, no change in status, stable for surgery.  I have reviewed the patient's chart and labs.  Questions were answered to the patient's satisfaction.     Charlton Haws

## 2012-02-13 NOTE — Progress Notes (Signed)
ANTICOAGULATION CONSULT NOTE - Follow-Up Consult  Pharmacy Consult for Heparin Indication: chest pain/ACS  No Known Allergies  Labs:  Basename 02/13/12 0916 02/13/12 0558 02/13/12 0116 02/12/12 2145 02/12/12 1751 02/12/12 1510  HGB -- -- 11.9* -- -- 14.7  HCT -- -- 35.4* -- -- 43.9  PLT -- -- 200 -- -- 258  APTT -- -- -- -- -- --  LABPROT -- 13.9 -- -- -- --  INR -- 1.08 -- -- -- --  HEPARINUNFRC 0.35 -- 0.26* -- -- --  CREATININE -- -- -- -- -- 0.91  CKTOTAL -- -- -- -- -- --  CKMB -- -- -- -- -- --  TROPONINI -- -- <0.30 <0.30 <0.30 --    Estimated Creatinine Clearance: 38.5 ml/min (by C-G formula based on Cr of 0.91).   Assessment: Pt is 78 year old female on heparin for chest pain/acs. CBC is wnl.   Heparin level is therapeutic at 0.35  Goal of Therapy:  Heparin level 0.3-0.7 units/ml Monitor platelets by anticoagulation protocol: Yes   Plan:  1) Continue heparin at 750 units/hr 2) Follow up after cath  Thank you. Okey Regal, PharmD [phone]  02/13/2012 10:21 AM

## 2012-02-13 NOTE — Progress Notes (Signed)
ANTICOAGULATION CONSULT NOTE - Follow-Up Consult  Pharmacy Consult for Heparin Indication: chest pain/ACS  No Known Allergies  Patient Measurements: Height: 5' 5.5" (166.4 cm) Weight: 134 lb 14.7 oz (61.2 kg) IBW/kg (Calculated) : 58.15   Vital Signs: Temp: 97.5 F (36.4 C) (11/03 1626) Temp src: Oral (11/03 1626) BP: 132/91 mmHg (11/03 1800) Pulse Rate: 58  (11/03 1800)  Labs:  Basename 02/13/12 0116 02/12/12 2145 02/12/12 1751 02/12/12 1510  HGB 11.9* -- -- 14.7  HCT 35.4* -- -- 43.9  PLT 200 -- -- 258  APTT -- -- -- --  LABPROT -- -- -- --  INR -- -- -- --  HEPARINUNFRC 0.26* -- -- --  CREATININE -- -- -- 0.91  CKTOTAL -- -- -- --  CKMB -- -- -- --  TROPONINI -- <0.30 <0.30 --    Estimated Creatinine Clearance: 38.5 ml/min (by C-G formula based on Cr of 0.91).   Assessment: Pt is 78 year old female on heparin for chest pain/acs. CBC is wnl. Heparin level 0.26 but drawn only 3 hours past heparin start. Noted plan for cath today.  Goal of Therapy:  Heparin level 0.3-0.7 units/ml Monitor platelets by anticoagulation protocol: Yes   Plan:  1) Continue heparin at 750 units/hr 2) F/u level at 0800 with last set of cardiac enzymes and will adjust heparin based on this level  Thank you,  Christoper Fabian, PharmD, BCPS Clinical pharmacist, pager [phone] 02/13/2012 3:03 AM

## 2012-02-14 ENCOUNTER — Encounter (HOSPITAL_COMMUNITY): Payer: Self-pay | Admitting: Nurse Practitioner

## 2012-02-14 ENCOUNTER — Inpatient Hospital Stay (HOSPITAL_COMMUNITY): Payer: Medicare Other

## 2012-02-14 LAB — CBC
HCT: 37.3 % (ref 36.0–46.0)
Hemoglobin: 12.5 g/dL (ref 12.0–15.0)
MCH: 30.6 pg (ref 26.0–34.0)
MCHC: 33.5 g/dL (ref 30.0–36.0)
MCV: 91.2 fL (ref 78.0–100.0)
Platelets: 205 10*3/uL (ref 150–400)
RBC: 4.09 MIL/uL (ref 3.87–5.11)
RDW: 12.6 % (ref 11.5–15.5)
WBC: 6.7 10*3/uL (ref 4.0–10.5)

## 2012-02-14 LAB — BASIC METABOLIC PANEL
BUN: 13 mg/dL (ref 6–23)
CO2: 26 mEq/L (ref 19–32)
Calcium: 9.3 mg/dL (ref 8.4–10.5)
Chloride: 99 mEq/L (ref 96–112)
Creatinine, Ser: 0.79 mg/dL (ref 0.50–1.10)
GFR calc Af Amer: 83 mL/min — ABNORMAL LOW (ref >90)
GFR calc non Af Amer: 72 mL/min — ABNORMAL LOW (ref >90)
Glucose, Bld: 93 mg/dL (ref 70–99)
Potassium: 4 mEq/L (ref 3.5–5.1)
Sodium: 136 mEq/L (ref 135–145)

## 2012-02-14 MED ORDER — SODIUM CHLORIDE 0.9 % IV SOLN
INTRAVENOUS | Status: DC
  Administered 2012-02-14: 18:00:00 via INTRAVENOUS

## 2012-02-14 MED ORDER — ENOXAPARIN SODIUM 60 MG/0.6ML ~~LOC~~ SOLN
1.0000 mg/kg | Freq: Two times a day (BID) | SUBCUTANEOUS | Status: DC
  Administered 2012-02-14 – 2012-02-15 (×2): 60 mg via SUBCUTANEOUS
  Filled 2012-02-14 (×4): qty 0.6

## 2012-02-14 MED ORDER — IOHEXOL 350 MG/ML SOLN
100.0000 mL | Freq: Once | INTRAVENOUS | Status: AC | PRN
  Administered 2012-02-14: 100 mL via INTRAVENOUS

## 2012-02-14 MED ORDER — TECHNETIUM TO 99M ALBUMIN AGGREGATED
6.0000 | Freq: Once | INTRAVENOUS | Status: AC | PRN
  Administered 2012-02-14: 6 via INTRAVENOUS

## 2012-02-14 MED ORDER — TECHNETIUM TC 99M DIETHYLENETRIAME-PENTAACETIC ACID: 40.0000 | Freq: Once | INTRAVENOUS | Status: AC | PRN

## 2012-02-14 MED ORDER — ALUM & MAG HYDROXIDE-SIMETH 200-200-20 MG/5ML PO SUSP
30.0000 mL | Freq: Once | ORAL | Status: AC
  Administered 2012-02-14: 30 mL via ORAL
  Filled 2012-02-14: qty 30

## 2012-02-14 NOTE — Progress Notes (Signed)
ANTICOAGULATION CONSULT NOTE - Follow-Up Consult  Pharmacy Consult for Lovenox Indication: Rule out Pulmonary Embolus  No Known Allergies  Labs:  Basename 02/14/12 0608 02/13/12 0916 02/13/12 0910 02/13/12 0558 02/13/12 0116 02/12/12 2145 02/12/12 1510  HGB 12.5 13.1 -- -- -- -- --  HCT 37.3 39.0 -- -- 35.4* -- --  PLT 205 163 -- -- 200 -- --  APTT -- -- -- -- -- -- --  LABPROT -- -- -- 13.9 -- -- --  INR -- -- -- 1.08 -- -- --  HEPARINUNFRC -- 0.35 -- -- 0.26* -- --  CREATININE 0.79 -- -- -- -- -- 0.91  CKTOTAL -- -- -- -- -- -- --  CKMB -- -- -- -- -- -- --  TROPONINI -- -- <0.30 -- <0.30 <0.30 --    Estimated Creatinine Clearance: 43.8 ml/min (by C-G formula based on Cr of 0.79).   Assessment: Pt is 78 year old female previously on heparin for chest pain/acs. She was subsequently taken to cath and found to have some residual disease that was not felt to benefit from intervention. A VQ scan showed intermediate liklihood of PE, D-dimer elevated, CT angio is ordered. Will start lovenox for PE.   Goal of Therapy:  Anti-Xa level 0.6-1.2 units/ml 4hrs after LMWH dose given Monitor platelets by anticoagulation protocol: Yes   Plan:  1) Lovenox 60mg  q 12hours 2) CBC q 72h 3) Follow up on CT angio  Thank you. Sheppard Coil, PharmD [phone]  02/14/2012 5:50 PM

## 2012-02-14 NOTE — Progress Notes (Signed)
V:Q scan results reviewed - intermediate probability for PE.  Discussed with Dr. Daleen Squibb.  We will add heparin and gently hydrate tonight. F/U bmet in AM and plan repeat CTA to definitively r/o PE tomorrow.  Discussed with patient and family who are agreeable to proceed.

## 2012-02-14 NOTE — Progress Notes (Signed)
   Subjective:  Still with some DOE; no chest pain; weak   Objective:  Filed Vitals:   02/13/12 1730 02/13/12 1830 02/13/12 2100 02/14/12 0500  BP: 128/68 132/72 139/50 152/60  Pulse: 65 62 58 59  Temp:   97.8 F (36.6 C) 97.5 F (36.4 C)  TempSrc:   Oral Oral  Resp: 16     Height:      Weight:      SpO2:   95% 95%    Intake/Output from previous day:  Intake/Output Summary (Last 24 hours) at 02/14/12 0746 Last data filed at 02/13/12 2100  Gross per 24 hour  Intake    600 ml  Output   1000 ml  Net   -400 ml    Physical Exam: Physical exam: Well-developed well-nourished in no acute distress.  Skin is warm and dry.  HEENT is normal.  Neck is supple.  Chest is clear to auscultation with normal expansion.  Cardiovascular exam is regular rate and rhythm.  Abdominal exam nontender or distended. No masses palpated. Extremities show no edema. neuro grossly intact  Rectal  - dark stool; no blood; hemocult sent to lab  Lab Results: Basic Metabolic Panel:  [facility] 02/12/12 1510  NA 137  K 4.0  CL 100  CO2 29  GLUCOSE 103*  BUN 18  CREATININE 0.91  CALCIUM 9.6  MG --  PHOS --   CBC:  Basename 02/14/12 0608 02/13/12 0916 02/12/12 1510  WBC 6.7 8.3 --  NEUTROABS -- -- 3.8  HGB 12.5 13.1 --  HCT 37.3 39.0 --  MCV 91.2 91.3 --  PLT 205 163 --   Cardiac Enzymes:  Basename 02/13/12 0910 02/13/12 0116 02/12/12 2145  CKTOTAL -- -- --  CKMB -- -- --  CKMBINDEX -- -- --  TROPONINI <0.30 <0.30 <0.30     Assessment/Plan:  1 Dyspnea/chest pain- cath results noted; plan medical therapy; no progression of CAD to explain symptoms; LVEDP only mildly elevated. DDimer minimally elevated; plan CTA today; if negative; DC on meds and fu with me 4 weeks 2 HTN - continue present meds 3 Hyperlipidemia - continue statin >30 min PA and physician time D2 Olga Millers 02/14/2012, 7:46 AM

## 2012-02-15 ENCOUNTER — Encounter (HOSPITAL_COMMUNITY): Payer: Self-pay | Admitting: Physician Assistant

## 2012-02-15 DIAGNOSIS — R0989 Other specified symptoms and signs involving the circulatory and respiratory systems: Secondary | ICD-10-CM

## 2012-02-15 DIAGNOSIS — I10 Essential (primary) hypertension: Secondary | ICD-10-CM

## 2012-02-15 DIAGNOSIS — I2510 Atherosclerotic heart disease of native coronary artery without angina pectoris: Secondary | ICD-10-CM

## 2012-02-15 LAB — BASIC METABOLIC PANEL
BUN: 12 mg/dL (ref 6–23)
CO2: 27 mEq/L (ref 19–32)
Calcium: 8.9 mg/dL (ref 8.4–10.5)
Chloride: 102 mEq/L (ref 96–112)
Creatinine, Ser: 0.79 mg/dL (ref 0.50–1.10)
GFR calc Af Amer: 83 mL/min — ABNORMAL LOW (ref >90)
GFR calc non Af Amer: 72 mL/min — ABNORMAL LOW (ref >90)
Glucose, Bld: 94 mg/dL (ref 70–99)
Potassium: 3.9 mEq/L (ref 3.5–5.1)
Sodium: 136 mEq/L (ref 135–145)

## 2012-02-15 LAB — CBC
HCT: 37.1 % (ref 36.0–46.0)
Hemoglobin: 12.2 g/dL (ref 12.0–15.0)
MCH: 30.3 pg (ref 26.0–34.0)
MCHC: 32.9 g/dL (ref 30.0–36.0)
MCV: 92.1 fL (ref 78.0–100.0)
Platelets: 182 10*3/uL (ref 150–400)
RBC: 4.03 MIL/uL (ref 3.87–5.11)
RDW: 12.9 % (ref 11.5–15.5)
WBC: 6.5 10*3/uL (ref 4.0–10.5)

## 2012-02-15 NOTE — Discharge Summary (Signed)
Patient seen and examined and history reviewed. Agree with above findings and plan. See rounding note earlier today. Reviewed original CT with Dr. Carlota Raspberry. He felt study was diagnostic and there was no evidence of PE. Will DC heparin and DC home.  Thedora Hinders 02/15/2012 10:29 AM

## 2012-02-15 NOTE — Discharge Summary (Signed)
Discharge Summary   Patient ID: [name] MRN: [id number], DOB/AGE: December 09, 1920 78 y.o. Admit date: 02/12/2012 D/C date:     02/15/2012  Primary Cardiologist: Jens Som  Primary Discharge Diagnoses:  1. Chest pain, unclear etiology - cath with no progression of CAD to explain symptoms - D-dimer minimally elevated but re-read of CTA was felt negative for PE 2. CAD (coronary artery disease)  - cath as above - history: 07/2007 PCI/Promus DES to LAD; 02/2010 PCI/Promus DES to dRCA. 3. Hypertension  Secondary Discharge Diagnoses:  1. Hypothyroidism 2. IBS 3. Carotid bruit 4. Hyperlipidemia 5. GERD 6. Hemorrhoids  Hospital Course: 77 y/o F with hx of CAD, HTN, HL presented to [facility] 02/12/12 complaining of progressive DOE and exertional chest pain along with an episode of nitrate responsive chest pain at rest early last week. At that time she called the office and was advised to present to the ED, but didn't. Since then, symptoms have persisted intermittently and she has felt generally fatigued thus came to the ER. EKG was non-acute and initial cardiac markers were negative. She was hypertensive on presentation, but not tachycardic or tachypnic. Her symptoms were concerning for [place] so she was started on heparin with plan for cath the following day. CE's remained negative. She did have an initial drop in Hgb by her 2nd CBC but rectal exam was negative for bleeding and FOBT was negative - her Hgb stabilized and remained normal the remainder of her stay. Cardiac cath demonstrated some disease in sub 2mm vessels OM and diagonal, but these appeared similar to 2011 and not likely to benefit from intervention. Her two stents were widely patent. EF was hyperdynamic and LVEDP was not terribly high. Med rx was recommended. See below for full report. D-dimer was obtained which was minimally elevated. She had a CT angio which demonstrated atheroscerlosis, eventration of the R hemidiaphragm,  suspected left nephrolithasis, but the initial report suggested motion degraded for PE with high density contrast in the SVT slightly obscuring immediately adjacent RUL PA branches. She subsequently underwent VQ which was intermediate probability. She was started on Lovenox. The plan was to hydrate her then repeat CTA this morning. However, radiology discussed the case with Dr. Sw[place] who felt that repeat CT would not necessarily be helpful as some of the limitations were based on the patient's anatomy. A repeat read was performed on the CT angio and the radiologist felt that the study was adequate to evaluate for pulmonary embolus and no pulmonary embolism is present. Lovenox was discontinued. She is not hypoxic. The results were conveyed to the patient. Dr. Swaziland has seen and examined her and feels she is stable for discharge.  Discharge Vitals: Blood pressure 149/64, pulse 58, temperature 98.1 F (36.7 C), temperature source Oral, resp. rate 18, height 5' 5.5" (1.664 m), weight 134 lb 6.4 oz (60.963 kg), SpO2 94.00%.  Labs: Lab Results  Component Value Date   WBC 6.5 02/15/2012   HGB 12.2 02/15/2012   HCT 37.1 02/15/2012   MCV 92.1 02/15/2012   PLT 182 02/15/2012    Lab 02/15/12 0547  NA 136  K 3.9  CL 102  CO2 27  BUN 12  CREATININE 0.79  CALCIUM 8.9  PROT --  BILITOT --  ALKPHOS --  ALT --  AST --  GLUCOSE 94    Basename 02/13/12 0910 02/13/12 0116 02/12/12 2145 02/12/12 1751  CKTOTAL -- -- -- --  CKMB -- -- -- --  TROPONINI <0.30 <0.30 <  0.30 <0.30   Lab Results  Component Value Date   CHOL 135 02/13/2012   HDL 42 02/13/2012   LDLCALC 68 02/13/2012   TRIG 123 02/13/2012   Lab Results  Component Value Date   DDIMER 0.54* 02/13/2012    Diagnostic Studies/Procedures   Cardiac Cath 02/13/12 Coronary Arteries:  Right dominant with no anomalies  LM: Normal  LAD: 20% proximal Widely patent stent to mid vessel 60% distal disease  D1: small and 70% ostial disease  D2 small  and diffuse beaded distal disease  Circumflex: Small vessel with primarily one AV groove OM1 brand Diffuse 80% disease in mid vessel Similar in appearance  To cath done by Dr Juanda Chance in 2011  RCA: Calcified ostium No significant lesion on cusp injection Some catheter tip spasm with no torque. 40% ostial lesiion  Widely patent distal stent.  PDA: normal  PLA: normal  Ventriculography: EF: 75% %, hyperdynamic with no RWMA;s  Hemodynamics:  Aortic Pressure: 154 64 mmHg LV Pressure: 140 19 mmHg  Impression: She has some disease in sub 2mm vessels OM and diagonal These look similar to 2011 and not likely to benefit  From intervention. Two stents are widely patent. Primary symptom is dyspnea not chest pain. EF hyperdynamic and LVEDP  Not terribly high. Continued medical Rx warranted.  Dg Chest 2 View 02/14/2012  *RADIOLOGY REPORT*  Clinical Data: Shortness of breath.  Pre VQ scan.  Tired and weak.  CHEST - 2 VIEW  Comparison: 02/12/2012, CT 02/14/2012  Findings: The heart is enlarged.  There is focal eventration in the right hemidiaphragm.  Lungs are clear.  No edema.  Degenerative changes are seen in the mid thoracic spine.  There is atherosclerotic calcification of the thoracic aorta.  IMPRESSION: No evidence for acute cardiopulmonary abnormality.   Original Report Authenticated By: Norva Pavlov, M.D.    Ct Angio Chest Pe W/cm &/or Wo Cm 02/15/2012  **ADDENDUM** CREATED: 02/15/2012 09:01:05  The study is technically adequate to evaluate for pulmonary embolus and no pulmonary embolism is present.  The case was discussed with Dr. Swaziland on the day of addendum at 0912 hours.  **END ADDENDUM** SIGNED BY: Andreas Newport, M.D. 02/14/2012  *RADIOLOGY REPORT*  Clinical Data: Shortness of breath.  Recent cardiac catheterization.  CT ANGIOGRAPHY CHEST  Technique:  Multidetector CT imaging of the chest using the standard protocol during bolus administration of intravenous contrast. Multiplanar reconstructed images  including MIPs were obtained and reviewed to evaluate the vascular anatomy.  Contrast: OMNIPAQUE IOHEXOL 350 MG/ML SOLN  Comparison: 02/12/2012  Findings: Motion artifact and streak from heterogeneous high density contrast in the SVC slightly obscures immediately adjacent right anterior upper lobe pulmonary arterial branches, likely leading to the double contour and heterogeneous appearance observed in this area.  On some images such as image 132 of series 6 and image 130 of series 6 as well as image 126 of series 6, chronic embolus in this vicinity is difficult to exclude, but is a less likely differential diagnostic consideration given the lack of other significant pulmonary arterial findings.  No reversal of the normal interventricular contour or significant reflux of contrast into the IVC.  Atherosclerotic aortic arch noted.  There is insufficient contrast in the aorta to assess for entities such as dissection.  Density in the left anterior descending coronary artery noted, likely from a stent. Calcification along the mitral valve noted.  Faint calcification along apparent scarring in the dome of the liver noted.  Anterior eventration of  the right hemidiaphragm observed.  Nonspecific 7 mm hypodense lesion of the left kidney upper pole.  Suspected 2 mm left kidney upper pole nonobstructive calculus.  1.6 cm cyst of segment one of the liver noted.  Biapical pleuroparenchymal scarring noted.  Suspected small calcified granuloma noted along the minor fissure.  3 mm calcified granuloma noted in the left upper lobe on image 14 of series 5.  Thoracic spondylosis noted.  IMPRESSION:  1. Peripheral low density and several right upper lobe pulmonary arterial branches immediately adjacent to the contrast-filled SVC noted, most attributable to motion artifact and streak artifact from the SVC.  Chronic embolus in this location is a less likely differential diagnostic consideration.  If clinically warranted,  ventilation-perfusion lung scan might help for differentiation. The lack of evidence of embolic disease aside from this small segment obviously affected by motion and streak artifact favors that this appearance is artifactual. 2.  Atherosclerosis. 3.  Anterior eventration of the right hemidiaphragm. 4.  Suspected left nephrolithiasis. 5.  Old granulomatous disease.   Original Report Authenticated By: Gaylyn Rong, M.D.    Nm Pulmonary Perf And Vent 02/14/2012  *RADIOLOGY REPORT*  Clinical data:  Shortness of breath.  Abnormal CTA.  VENTILATION PERFUSION SCINTIGRAPHY  Findings: Ventilation imaging with 40 mCi technetium 84m DTPA inhaled shows a central heterogeneous distribution throughout both lungs. Perfusion imaging with 6. Tc2m MAA IV shows moderate segmental perfusion defects in the posterior right upper lobe , posterior left upper lobe, and superior segment left lower lobe. Elsewhere there is a somewhat heterogeneous distribution throughout the lungs.  IMPRESSION 1. Intermediate likelihood ratio for pulmonary embolism.   Original Report Authenticated By: D. Andria Rhein, MD    Discharge Medications   Current Discharge Medication List    CONTINUE these medications which have NOT CHANGED   Details  Alum & Mag Hydroxide-Simeth (ANTACID ANTI-GAS PO) Take 1 tablet by mouth as needed. For heartburn/gas    amLODipine (NORVASC) 5 MG tablet Take 5 mg by mouth daily.      aspirin 81 MG tablet Take 1 tablet (81 mg total) by mouth daily.    Calcium Carbonate-Vitamin D (CALCIUM 600+D) 600-400 MG-UNIT per tablet Take 1 tablet by mouth daily.      docusate sodium (COLACE) 100 MG capsule Take 100 mg by mouth 2 (two) times daily.    enalapril (VASOTEC) 5 MG tablet TAKE 1 TABLET BY MOUTH TWICE A DAY Qty: 60 tablet, Refills: 9    levothyroxine (SYNTHROID, LEVOTHROID) 100 MCG tablet Take 100 mcg by mouth daily.      metoprolol (LOPRESSOR) 100 MG tablet Take 100 mg by mouth 2 (two) times daily.      Multiple Vitamin (MULTIVITAMIN) tablet Take 1 tablet by mouth daily.      nitroGLYCERIN (NITROSTAT) 0.4 MG SL tablet Place 0.4 mg under the tongue every 5 (five) minutes as needed. For chest pain    simvastatin (ZOCOR) 20 MG tablet Take 20 mg by mouth at bedtime.      tolterodine (DETROL LA) 2 MG 24 hr capsule Take 4 mg by mouth daily.      vitamin B-12 (CYANOCOBALAMIN) 500 MCG tablet Take 500 mcg by mouth daily.        Disposition   The patient will be discharged in stable condition to home. Discharge Orders    Future Appointments: Provider: Department: Dept Phone: Center:   02/27/2012 10:10 AM Beatrice Lecher, PA [facility] Main Office [place]) [phone] [facility]     Future  Orders Please Complete By Expires   Diet - low sodium heart healthy      Increase activity slowly      Comments:   No driving for 1 day. No lifting over 5 lbs for 1 week. No sexual activity for 1 week. Keep procedure site clean & dry. If you notice increased pain, swelling, bleeding or pus, call/return!  You may shower, but no soaking baths/hot tubs/pools for 1 week.     Follow-up Information    Follow up with Tereso Newcomer, PA. On 02/27/2012. (10:10am - Dr. Ludwig Clarks PA.)    Contact information:   [facility] [address] [place] [address] [phone]            Duration of Discharge Encounter: Greater than 30 minutes including physician and PA time.  Signed, Chaylee Ehrsam PA-C 02/15/2012, 10:09 AM

## 2012-02-15 NOTE — Progress Notes (Signed)
   Subjective:  Still with some DOE and sense of breathlessness; no chest pain   Objective:  Filed Vitals:   02/14/12 1800 02/14/12 2052 02/14/12 2200 02/15/12 0720  BP: 156/78 149/72 133/60 149/64  Pulse:  68 68 58  Temp:  98.2 F (36.8 C)  98.1 F (36.7 C)  TempSrc:  Oral    Resp:  16  18  Height:      Weight:    60.963 kg (134 lb 6.4 oz)  SpO2:  96%  94%    Intake/Output from previous day:  Intake/Output Summary (Last 24 hours) at 02/15/12 0837 Last data filed at 02/15/12 0400  Gross per 24 hour  Intake    483 ml  Output    350 ml  Net    133 ml    Physical Exam: Physical exam: Well-developed well-nourished in no acute distress.  Skin is warm and dry.  HEENT is normal.  Neck is supple.  Chest is clear to auscultation with normal expansion.  Cardiovascular exam is regular rate and rhythm.  Abdominal exam nontender or distended. No masses palpated. Extremities show no edema. neuro grossly intact   Lab Results: Basic Metabolic Panel:  Basename 02/15/12 0547 02/14/12 0608  NA 136 136  K 3.9 4.0  CL 102 99  CO2 27 26  GLUCOSE 94 93  BUN 12 13  CREATININE 0.79 0.79  CALCIUM 8.9 9.3  MG -- --  PHOS -- --   CBC:  Basename 02/15/12 0547 02/14/12 0608 02/12/12 1510  WBC 6.5 6.7 --  NEUTROABS -- -- 3.8  HGB 12.2 12.5 --  HCT 37.1 37.3 --  MCV 92.1 91.2 --  PLT 182 205 --   Cardiac Enzymes:  Basename 02/13/12 0910 02/13/12 0116 02/12/12 2145  CKTOTAL -- -- --  CKMB -- -- --  CKMBINDEX -- -- --  TROPONINI <0.30 <0.30 <0.30     Assessment/Plan:  1 Dyspnea/chest pain- cath results noted; plan medical therapy; no progression of CAD to explain symptoms; LVEDP only mildly elevated. DDimer minimally elevated; prior CTA poor quality- degraded by motion. V/Q scan intermediate probability. Plan for repeat CT chest today. If negative plan on discharging home. If positive will need full anticoagulation. 2 HTN - continue present meds 3 Hyperlipidemia -  continue statin  Judi Cong 02/15/2012, 8:37 AM

## 2012-02-15 NOTE — Progress Notes (Signed)
Discussed with Dr. Swaziland who had radiologist review CT angio. Per report, radiologist was confident there was no PE seen on initial scan. Thus will d/c order for CT angio this AM as well as Lovenox. Per Dr. Swaziland, pt will be ok for DC later this morning.

## 2012-02-15 NOTE — Progress Notes (Signed)
Pt provided with dc instructions and education. Pt verbalized understanding. Pt educated on all medications and pt able to [facility]. Pt has no questions at this time and is "ready to go home". IV removed with tip intact. Heart monitor returned to front. Pt to leave for home with family. Levonne Spiller, RN

## 2012-02-27 ENCOUNTER — Ambulatory Visit (INDEPENDENT_AMBULATORY_CARE_PROVIDER_SITE_OTHER): Payer: Medicare Other | Admitting: Physician Assistant

## 2012-02-27 ENCOUNTER — Encounter: Payer: Self-pay | Admitting: Physician Assistant

## 2012-02-27 VITALS — BP 132/70 | HR 66 | Resp 16 | Ht 66.0 in | Wt 141.8 lb

## 2012-02-27 DIAGNOSIS — R590 Localized enlarged lymph nodes: Secondary | ICD-10-CM

## 2012-02-27 DIAGNOSIS — R599 Enlarged lymph nodes, unspecified: Secondary | ICD-10-CM

## 2012-02-27 DIAGNOSIS — I10 Essential (primary) hypertension: Secondary | ICD-10-CM

## 2012-02-27 DIAGNOSIS — I2510 Atherosclerotic heart disease of native coronary artery without angina pectoris: Secondary | ICD-10-CM

## 2012-02-27 NOTE — Patient Instructions (Addendum)
Please schedule appointment with Dr. Ricki Miller when you return from your trip from [place] to have the swelling in your neck evaluated.   NO CHANGES WERE MADE TODAY  YOU HAVE AN APPOINTMENT WITH DR. CRENSHAW 06/01/12 @ 10:15 AM

## 2012-02-27 NOTE — Progress Notes (Signed)
[address], [address] [place], [place]  [address] Phone: [phone], Fax:  [phone]  Date:  02/27/2012   Name:  [name]   DOB:  24-Apr-1920   MRN:  [id number]  PCP:  Lori Patch, MD  Primary Cardiologist:  Dr. Olga Millers  Primary Electrophysiologist:  None    History of Present Illness: [name] is a 78 y.o. female who returns for follow up after a recent admission to the hospital for chest pain.  She has a h/o severe HTN, IBS and CAD with hx of PCI to the pLAD in 4/09 with a Palmaz DES and Promus DES to dRCA in 11/11. Admitted in 11/11 for [place]. Underwent cath -revealing a 95% stenosis in the distal RCA. 70-80% stenosis in a small left circumflex and 70-80% stenosis in the mid LAD, patent stent in pLAD with 30% ISR. The RCA was stented with a 3.0 x 15 mm Promus drug-eluting stent. EF is 70%. Last Myoview 9/12: No ischemia, EF 88%. Last echocardiogram 9/09: EF 55-60%, mild focal basal septal hypertrophy, moderate MR, mild LAE, mildly increased PASP.   She was admitted 11/3-11/6 with progressive dyspnea with exertion and exertional chest pain. She ruled out for myocardial infarction by enzymes. Of note, her hemoglobin did drop some. She was heme-negative by rectal exam. She was set up for cardiac catheterization. LHC 02/23/12:  pLAD 20%, mLAD stent patent, dLAD 60%, oD1 70% (small), small CFX with one AV groove OM1 branch with mid diffuse 80%-no change from 2011, oRCA 40%, dRCA stent patent, EF 75% - medical Rx recommended. Of note, she had an elevated d-dimer. Chest CT suggested motion degradation for pulmonary embolism with high density contrast in the SVC. V/Q scan was obtained which was intermediate probability. CT films were reviewed again with the radiologist who felt that the scan was adequate and was negative for pulmonary embolism.  Since d/c she is doing ok.  No further chest pain.  No syncope.  No orthopnea, PND, edema.  She has mild DOE and  probably describes Class II-IIb symptoms.  She feels tired.  She is excited to be going to [place] next week for Thanksgiving.    Labs (11/13):    K 3.9, creatinine 0.79, LDL 68, Hgb 12.2, TSH 1.581  Wt Readings from Last 3 Encounters:  02/27/12 141 lb 12.8 oz (64.32 kg)  02/15/12 134 lb 6.4 oz (60.963 kg)  02/15/12 134 lb 6.4 oz (60.963 kg)     Past Medical History  Diagnosis Date  . CAD (coronary artery disease)     a. 07/2007 PCI/Promus DES to LAD;  b. 02/2010 PCI/Promus DES to dRCA.;  c. 12/2010 Lexi MV: EF 88%, no isch/infarct.;  d. 02/2012 Cath: LM nl, LAD 20p, patent mid stent, 60d, D1 sm 70ost, D2 sm, diff, LCX sm , OM1 diff 57m (no change), RCA 40ost, patent stent, PD/PL nl, EF 75% w/o rwma's.  Marland Kitchen HTN (hypertension)   . Hypothyroidism   . Diverticulosis   . IBS (irritable bowel syndrome)   . Carotid bruit   . HLD (hyperlipidemia)   . GERD (gastroesophageal reflux disease)   . Hemorrhoids     Current Outpatient Prescriptions  Medication Sig Dispense Refill  . Alum & Mag Hydroxide-Simeth (ANTACID ANTI-GAS PO) Take 1 tablet by mouth as needed. For heartburn/gas      . amLODipine (NORVASC) 5 MG tablet Take 5 mg by mouth daily.        Marland Kitchen aspirin 81 MG tablet Take  1 tablet (81 mg total) by mouth daily.      . Calcium Carbonate-Vitamin D (CALCIUM 600+D) 600-400 MG-UNIT per tablet Take 1 tablet by mouth daily.        . enalapril (VASOTEC) 5 MG tablet TAKE 1 TABLET BY MOUTH TWICE A DAY  60 tablet  9  . levothyroxine (SYNTHROID, LEVOTHROID) 100 MCG tablet Take 100 mcg by mouth daily.        . metoprolol (LOPRESSOR) 100 MG tablet Take 100 mg by mouth 2 (two) times daily.      . Multiple Vitamin (MULTIVITAMIN) tablet Take 1 tablet by mouth daily.        . nitroGLYCERIN (NITROSTAT) 0.4 MG SL tablet Place 0.4 mg under the tongue every 5 (five) minutes as needed. For chest pain      . simvastatin (ZOCOR) 20 MG tablet Take 20 mg by mouth at bedtime.        . vitamin B-12 (CYANOCOBALAMIN) 500  MCG tablet Take 500 mcg by mouth daily.        Allergies:No Known Allergies  Social History:  The patient  reports that she has never smoked. She does not have any smokeless tobacco history on file. She reports that she does not drink alcohol or use illicit drugs.   ROS:  Please see the history of present illness.  She notes a "knot" on her neck.  All other systems reviewed and negative.   PHYSICAL EXAM: VS:  BP 132/70  Pulse 66  Resp 16  Ht 5\' 6"  (1.676 m)  Wt 141 lb 12.8 oz (64.32 kg)  BMI 22.89 kg/m2 Well nourished, well developed, in no acute distress HEENT: normal Neck: + anterior cervical adenopathy, non-tender Cardiac:  normal S1, S2; RRR; no murmur Lungs:  clear to auscultation bilaterally, no wheezing, rhonchi or rales Abd: soft, nontender, no hepatomegaly Ext: no edema Skin: warm and dry Neuro:  CNs 2-12 intact, no focal abnormalities noted  EKG:  NSR, HR 64, NSSTTW changes      ASSESSMENT AND PLAN:  1. Coronary Artery Disease:   No further CP.  Anatomy stable by recent LHC.  Continue med Rx.  Continue ASA and statin.  Follow up with Dr. Olga Millers in 3 mos.  2. Hypertension:   Controlled.  Continue current therapy.   3. Hyperlipidemia:  Continue Zocor.  4. Cervical Adenopathy:  She has a fairly large LN in the right anterior cervical chain.  States it has been there for a few mos.  I offered her an [place] vs close follow up with her PCP to decide on further workup.  She prefers to see her PCP first.  She will make an appt for when she gets back from her trip.  I explained to her that it is important that she have this followed up.  She understands.   Luna Glasgow, PA-C  10:42 AM 02/27/2012

## 2012-05-14 ENCOUNTER — Encounter: Payer: Self-pay | Admitting: Cardiology

## 2012-06-01 ENCOUNTER — Ambulatory Visit (INDEPENDENT_AMBULATORY_CARE_PROVIDER_SITE_OTHER): Payer: Medicare Other | Admitting: Cardiology

## 2012-06-01 ENCOUNTER — Encounter: Payer: Self-pay | Admitting: Cardiology

## 2012-06-01 VITALS — BP 142/78 | HR 66 | Wt 141.0 lb

## 2012-06-01 DIAGNOSIS — I2510 Atherosclerotic heart disease of native coronary artery without angina pectoris: Secondary | ICD-10-CM

## 2012-06-01 DIAGNOSIS — I10 Essential (primary) hypertension: Secondary | ICD-10-CM

## 2012-06-01 DIAGNOSIS — E785 Hyperlipidemia, unspecified: Secondary | ICD-10-CM

## 2012-06-01 NOTE — Assessment & Plan Note (Signed)
Blood pressure controlled. Continue present medications. 

## 2012-06-01 NOTE — Assessment & Plan Note (Signed)
Continue statin. 

## 2012-06-01 NOTE — Assessment & Plan Note (Signed)
Continue aspirin and statin. 

## 2012-06-01 NOTE — Patient Instructions (Addendum)
Your physician wants you to follow-up in: 6 MONTHS WITH DR CRENSHAW You will receive a reminder letter in the mail two months in advance. If you don't receive a letter, please call our office to schedule the follow-up appointment.  

## 2012-06-01 NOTE — Progress Notes (Signed)
HPI: Pleasant female for fu of CAD. She has a h/o severe HTN, IBS and CAD with hx of PCI to the pLAD in 4/09 with a Palmaz DES and Promus DES to dRCA in 11/11. Last echocardiogram 9/09: EF 55-60%, mild focal basal septal hypertrophy, moderate MR, mild LAE, mildly increased PASP.  LHC 02/23/12 performed for chest pain and dyspnea: pLAD 20%, mLAD stent patent, dLAD 60%, oD1 70% (small), small CFX with one AV groove OM1 branch with mid diffuse 80%-no change from 2011, oRCA 40%, dRCA stent patent, EF 75% - medical Rx recommended. Of note, she had an elevated d-dimer. Chest CT suggested motion degradation for pulmonary embolism with high density contrast in the SVC. V/Q scan was obtained which was intermediate probability. CT films were reviewed again with the radiologist who felt that the scan was adequate and was negative for pulmonary embolism.  Patient last seen in Nov 2013. Since then, the patient denies any dyspnea on exertion, orthopnea, PND, pedal edema, palpitations, syncope or chest pain.    Current Outpatient Prescriptions  Medication Sig Dispense Refill  . Alum & Mag Hydroxide-Simeth (ANTACID ANTI-GAS PO) Take 1 tablet by mouth as needed. For heartburn/gas      . aspirin 81 MG tablet Take 1 tablet (81 mg total) by mouth daily.      . Calcium Carbonate-Vitamin D (CALCIUM 600+D) 600-400 MG-UNIT per tablet Take 1 tablet by mouth daily.        . enalapril (VASOTEC) 5 MG tablet TAKE 1 TABLET BY MOUTH TWICE A DAY  60 tablet  9  . levothyroxine (SYNTHROID, LEVOTHROID) 100 MCG tablet Take 100 mcg by mouth daily.        . metoprolol (LOPRESSOR) 100 MG tablet Take 100 mg by mouth 2 (two) times daily.      . Multiple Vitamin (MULTIVITAMIN) tablet Take 1 tablet by mouth daily.        . nitroGLYCERIN (NITROSTAT) 0.4 MG SL tablet Place 0.4 mg under the tongue every 5 (five) minutes as needed. For chest pain      . omeprazole (PRILOSEC) 20 MG capsule Take 20 mg by mouth daily.      . simvastatin  (ZOCOR) 20 MG tablet Take 20 mg by mouth at bedtime.        . vitamin B-12 (CYANOCOBALAMIN) 500 MCG tablet Take 500 mcg by mouth daily.       No current facility-administered medications for this visit.     Past Medical History  Diagnosis Date  . CAD (coronary artery disease)     a. 07/2007 PCI/Promus DES to LAD;  b. 02/2010 PCI/Promus DES to dRCA.;  c. 12/2010 Lexi MV: EF 88%, no isch/infarct.;  d. 02/2012 Cath: LM nl, LAD 20p, patent mid stent, 60d, D1 sm 70ost, D2 sm, diff, LCX sm , OM1 diff 35m (no change), RCA 40ost, patent stent, PD/PL nl, EF 75% w/o rwma's.  Marland Kitchen HTN (hypertension)   . Hypothyroidism   . Diverticulosis   . IBS (irritable bowel syndrome)   . Carotid bruit   . HLD (hyperlipidemia)   . GERD (gastroesophageal reflux disease)   . Hemorrhoids     Past Surgical History  Procedure Laterality Date  . Back surgery    . Hysterectomy - unknown type    . Rectocele repair    . Coronary angioplasty with stent placement      History   Social History  . Marital Status: Widowed    Spouse Name: N/A  Number of Children: 4  . Years of Education: N/A   Occupational History  . retired    Social History Main Topics  . Smoking status: Never Smoker   . Smokeless tobacco: Not on file  . Alcohol Use: No  . Drug Use: No  . Sexually Active: Not on file   Other Topics Concern  . Not on file   Social History Narrative   Lives in [place].    ROS: cold symptoms/congestion but no fevers or chills, productive cough, hemoptysis, dysphasia, odynophagia, melena, hematochezia, dysuria, hematuria, rash, seizure activity, orthopnea, PND, pedal edema, claudication. Remaining systems are negative.  Physical Exam: Well-developed well-nourished in no acute distress.  Skin is warm and dry.  HEENT is normal.  Neck is supple.  Chest is clear to auscultation with normal expansion.  Cardiovascular exam is regular rate and rhythm.  Abdominal exam nontender or distended. No masses  palpated. Extremities show no edema. neuro grossly intact  ECG sinus rhythm at a rate of 66. First degree AV block. No ST changes.

## 2012-06-13 ENCOUNTER — Observation Stay (HOSPITAL_COMMUNITY)
Admission: EM | Admit: 2012-06-13 | Discharge: 2012-06-14 | Disposition: A | Discharge status: Home / Self Care | Payer: Medicare Other | Attending: Emergency Medicine | Admitting: Emergency Medicine

## 2012-06-13 ENCOUNTER — Emergency Department (HOSPITAL_COMMUNITY): Payer: Medicare Other

## 2012-06-13 ENCOUNTER — Encounter (HOSPITAL_COMMUNITY): Payer: Self-pay | Admitting: *Deleted

## 2012-06-13 ENCOUNTER — Other Ambulatory Visit: Payer: Self-pay

## 2012-06-13 DIAGNOSIS — R079 Chest pain, unspecified: Secondary | ICD-10-CM

## 2012-06-13 DIAGNOSIS — I2510 Atherosclerotic heart disease of native coronary artery without angina pectoris: Secondary | ICD-10-CM | POA: Diagnosis present

## 2012-06-13 DIAGNOSIS — R0789 Other chest pain: Principal | ICD-10-CM | POA: Diagnosis present

## 2012-06-13 DIAGNOSIS — I200 Unstable angina: Secondary | ICD-10-CM

## 2012-06-13 DIAGNOSIS — E785 Hyperlipidemia, unspecified: Secondary | ICD-10-CM | POA: Diagnosis present

## 2012-06-13 DIAGNOSIS — I10 Essential (primary) hypertension: Secondary | ICD-10-CM | POA: Diagnosis present

## 2012-06-13 DIAGNOSIS — R0602 Shortness of breath: Secondary | ICD-10-CM | POA: Insufficient documentation

## 2012-06-13 DIAGNOSIS — E039 Hypothyroidism, unspecified: Secondary | ICD-10-CM | POA: Diagnosis present

## 2012-06-13 LAB — POCT I-STAT TROPONIN I: Troponin i, poc: 0.07 ng/mL (ref 0.00–0.08)

## 2012-06-13 LAB — BASIC METABOLIC PANEL
BUN: 13 mg/dL (ref 6–23)
CO2: 25 mEq/L (ref 19–32)
Calcium: 9.8 mg/dL (ref 8.4–10.5)
Chloride: 103 mEq/L (ref 96–112)
Creatinine, Ser: 0.72 mg/dL (ref 0.50–1.10)
GFR calc Af Amer: 86 mL/min — ABNORMAL LOW (ref >90)
GFR calc non Af Amer: 74 mL/min — ABNORMAL LOW (ref >90)
Glucose, Bld: 103 mg/dL — ABNORMAL HIGH (ref 70–99)
Potassium: 3.9 mEq/L (ref 3.5–5.1)
Sodium: 141 mEq/L (ref 135–145)

## 2012-06-13 LAB — CBC
HCT: 40.1 % (ref 36.0–46.0)
HCT: 40.7 % (ref 36.0–46.0)
Hemoglobin: 13.7 g/dL (ref 12.0–15.0)
Hemoglobin: 13.7 g/dL (ref 12.0–15.0)
MCH: 31.9 pg (ref 26.0–34.0)
MCH: 31.4 pg (ref 26.0–34.0)
MCHC: 34.2 g/dL (ref 30.0–36.0)
MCHC: 33.7 g/dL (ref 30.0–36.0)
MCV: 93.5 fL (ref 78.0–100.0)
MCV: 93.3 fL (ref 78.0–100.0)
Platelets: 212 10*3/uL (ref 150–400)
Platelets: 222 10*3/uL (ref 150–400)
RBC: 4.29 MIL/uL (ref 3.87–5.11)
RBC: 4.36 MIL/uL (ref 3.87–5.11)
RDW: 13.6 % (ref 11.5–15.5)
RDW: 13.8 % (ref 11.5–15.5)
WBC: 7.7 10*3/uL (ref 4.0–10.5)
WBC: 7.2 10*3/uL (ref 4.0–10.5)

## 2012-06-13 LAB — TROPONIN I: Troponin I: <0.3 ng/mL (ref <0.30)

## 2012-06-13 LAB — CREATININE, SERUM
Creatinine, Ser: 0.71 mg/dL (ref 0.50–1.10)
GFR calc Af Amer: 86 mL/min — ABNORMAL LOW (ref >90)
GFR calc non Af Amer: 74 mL/min — ABNORMAL LOW (ref >90)

## 2012-06-13 LAB — D-DIMER, QUANTITATIVE: D-Dimer, Quant: 0.44 ug/mL-FEU (ref 0.00–0.48)

## 2012-06-13 LAB — PRO B NATRIURETIC PEPTIDE: Pro B Natriuretic peptide (BNP): 672 pg/mL — ABNORMAL HIGH (ref 0–450)

## 2012-06-13 MED ORDER — ONDANSETRON HCL 4 MG/2ML IJ SOLN: 4.0000 mg | Freq: Four times a day (QID) | INTRAMUSCULAR | Status: DC | PRN

## 2012-06-13 MED ORDER — ASPIRIN EC 81 MG PO TBEC
81.0000 mg | DELAYED_RELEASE_TABLET | Freq: Every day | ORAL | Status: DC
  Administered 2012-06-14: 81 mg via ORAL
  Filled 2012-06-13 (×2): qty 1

## 2012-06-13 MED ORDER — SIMVASTATIN 20 MG PO TABS
20.0000 mg | ORAL_TABLET | Freq: Every day | ORAL | Status: DC
  Administered 2012-06-13: 20 mg via ORAL
  Filled 2012-06-13 (×2): qty 1

## 2012-06-13 MED ORDER — ENALAPRIL MALEATE 10 MG PO TABS
10.0000 mg | ORAL_TABLET | Freq: Every day | ORAL | Status: DC
Start: 2012-06-13 — End: 2012-06-14
  Administered 2012-06-14: 10 mg via ORAL
  Filled 2012-06-13 (×2): qty 1

## 2012-06-13 MED ORDER — SODIUM CHLORIDE 0.9 % IJ SOLN
3.0000 mL | Freq: Two times a day (BID) | INTRAMUSCULAR | Status: DC
  Administered 2012-06-13: 3 mL via INTRAVENOUS

## 2012-06-13 MED ORDER — ACETAMINOPHEN 325 MG PO TABS
650.0000 mg | ORAL_TABLET | ORAL | Status: DC | PRN
  Administered 2012-06-14: 650 mg via ORAL
  Filled 2012-06-13: qty 2

## 2012-06-13 MED ORDER — ONE-DAILY MULTI VITAMINS PO TABS
1.0000 | ORAL_TABLET | Freq: Every day | ORAL | Status: DC
  Administered 2012-06-14: 1 via ORAL
  Filled 2012-06-13 (×2): qty 1

## 2012-06-13 MED ORDER — FUROSEMIDE 10 MG/ML IJ SOLN: 20.0000 mg | Freq: Once | INTRAMUSCULAR | Status: DC

## 2012-06-13 MED ORDER — NITROGLYCERIN 0.4 MG SL SUBL: 0.4000 mg | SUBLINGUAL_TABLET | SUBLINGUAL | Status: DC | PRN

## 2012-06-13 MED ORDER — ALPRAZOLAM 0.25 MG PO TABS: 0.2500 mg | ORAL_TABLET | Freq: Two times a day (BID) | ORAL | Status: DC | PRN

## 2012-06-13 MED ORDER — CYANOCOBALAMIN 500 MCG PO TABS
500.0000 ug | ORAL_TABLET | Freq: Every day | ORAL | Status: DC
  Administered 2012-06-14: 500 ug via ORAL
  Filled 2012-06-13 (×2): qty 1

## 2012-06-13 MED ORDER — HEPARIN (PORCINE) IN NACL 100-0.45 UNIT/ML-% IJ SOLN
750.0000 [IU]/h | INTRAMUSCULAR | Status: DC
  Filled 2012-06-13: qty 250

## 2012-06-13 MED ORDER — CALCIUM CARBONATE-VITAMIN D 500-200 MG-UNIT PO TABS
1.0000 | ORAL_TABLET | Freq: Every day | ORAL | Status: DC
  Administered 2012-06-14: 1 via ORAL
  Filled 2012-06-13 (×2): qty 1

## 2012-06-13 MED ORDER — METOPROLOL TARTRATE 100 MG PO TABS
100.0000 mg | ORAL_TABLET | Freq: Two times a day (BID) | ORAL | Status: DC
  Administered 2012-06-14: 100 mg via ORAL
  Filled 2012-06-13 (×2): qty 1

## 2012-06-13 MED ORDER — SODIUM CHLORIDE 0.9 % IJ SOLN: 3.0000 mL | INTRAMUSCULAR | Status: DC | PRN

## 2012-06-13 MED ORDER — ZOLPIDEM TARTRATE 5 MG PO TABS
5.0000 mg | ORAL_TABLET | Freq: Every evening | ORAL | Status: DC | PRN
  Administered 2012-06-13: 5 mg via ORAL
  Filled 2012-06-13: qty 1

## 2012-06-13 MED ORDER — SODIUM CHLORIDE 0.9 % IV SOLN: 250.0000 mL | INTRAVENOUS | Status: DC | PRN

## 2012-06-13 MED ORDER — LEVOTHYROXINE SODIUM 100 MCG PO TABS
100.0000 ug | ORAL_TABLET | Freq: Every day | ORAL | Status: DC
  Administered 2012-06-14: 100 ug via ORAL
  Filled 2012-06-13 (×2): qty 1

## 2012-06-13 MED ORDER — ENOXAPARIN SODIUM 40 MG/0.4ML ~~LOC~~ SOLN
40.0000 mg | SUBCUTANEOUS | Status: DC
  Administered 2012-06-13: 40 mg via SUBCUTANEOUS
  Filled 2012-06-13 (×2): qty 0.4

## 2012-06-13 MED ORDER — HEPARIN BOLUS VIA INFUSION: 4000.0000 [IU] | Freq: Once | INTRAVENOUS | Status: DC

## 2012-06-13 NOTE — Consult Note (Signed)
CARDIOLOGY HISTORY AND PHYSICAL   Patient ID: [name] MRN: [id number] DOB/AGE: [birth year] 79 y.o.  Admit date: 06/13/2012  Primary Physician   Juline Patch, MD Primary Cardiologist  [facility] Reason for Consultation   Chest pain, SOB  WUJ:[id number] P Herrera is a 79 y.o. female with a history of CAD, HTN, Hypothryoidism, Diverticulosis, IBD, Carotid Bruit, HLD, GERD and hemorrhoids who presents to the ED with complaints of not feeling well x1 month and Chest Pain. She states that she has had a cold for the past 2 weeks and saw her Cardiologist on 06/01/12 and "everything was fine with my heart and lungs." This morning she woke up, ate breakfast, and got ready when she noticed difficulty breathing after her shower while getting dressed. She states that she felt pressure in her chest which was causing her to breath heavier and she "felt like I was going to have a heart attack." When asked to describe her CP further, she is unable however states that she "felt shaky all over.". She then proceeded to take 1 SL Nitro, which provided some relief, and drove herself to the bowling alley to see her friends. Her son was notified and she was taken to the ED by her son. She currently feels as though she is still have trouble breathing and cannot state whether or not she is having active CP. She is also complaining of tenderness above bilateral ankles. She denies lower extremity swelling and recent travel.    Past Medical History  Diagnosis Date  . CAD (coronary artery disease)     a. 07/2007 PCI/Promus DES to LAD;  b. 02/2010 PCI/Promus DES to dRCA.;  c. 12/2010 Lexi MV: EF 88%, no isch/infarct.;  d. 02/2012 Cath: LM nl, LAD 20p, patent mid stent, 60d, D1 sm 70ost, D2 sm, diff, LCX sm , OM1 diff 76m (no change), RCA 40ost, patent stent, PD/PL nl, EF 75% Lori/o rwma's.  Marland Kitchen HTN (hypertension)   . Hypothyroidism   . Diverticulosis   . IBS (irritable bowel syndrome)   . Carotid bruit   . HLD (hyperlipidemia)   . GERD  (gastroesophageal reflux disease)   . Hemorrhoids      Past Surgical History  Procedure Laterality Date  . Back surgery    . Hysterectomy - unknown type    . Rectocele repair    . Coronary angioplasty with stent placement      No Known Allergies  I have reviewed the patient's current medications       Prior to Admission medications   Medication Sig Start Date End Date Taking? Authorizing Provider  aspirin 81 MG tablet Take 1 tablet (81 mg total) by mouth daily. 12/03/10  Yes Dolores Patty, MD  Calcium Carbonate-Vitamin D (CALCIUM 600+D) 600-400 MG-UNIT per tablet Take 1 tablet by mouth daily.     Yes Historical Provider, MD  enalapril (VASOTEC) 5 MG tablet Take 5 mg by mouth daily.   Yes Historical Provider, MD  levothyroxine (SYNTHROID, LEVOTHROID) 100 MCG tablet Take 100 mcg by mouth daily.     Yes Historical Provider, MD  metoprolol (LOPRESSOR) 100 MG tablet Take 100 mg by mouth 2 (two) times daily.   Yes Historical Provider, MD  Multiple Vitamin (MULTIVITAMIN) tablet Take 1 tablet by mouth daily.     Yes Historical Provider, MD  nitroGLYCERIN (NITROSTAT) 0.4 MG SL tablet Place 0.4 mg under the tongue every 5 (five) minutes as needed. For chest pain   Yes Historical Provider, MD  simvastatin (  ZOCOR) 20 MG tablet Take 20 mg by mouth at bedtime.     Yes Historical Provider, MD  vitamin B-12 (CYANOCOBALAMIN) 500 MCG tablet Take 500 mcg by mouth daily.   Yes Historical Provider, MD     History   Social History  . Marital Status: Widowed    Spouse Name: N/A    Number of Children: 4  . Years of Education: N/A   Occupational History  . retired    Social History Main Topics  . Smoking status: Never Smoker   . Smokeless tobacco: Not on file  . Alcohol Use: No  . Drug Use: No  . Sexually Active: Not on file   Other Topics Concern  . Not on file   Social History Narrative   Lives in [place].    Family Status  Relation Status Death Age  . Mother Deceased 6     Myasthenia gravis  . Father Deceased 67    complications after hip fx    Family History  Problem Relation Age of Onset  . Myasthenia gravis Mother   . Hip fracture Father   . Heart disease      several uncles with CAD   ROS:  Full 14 point review of systems complete and found to be negative unless listed above.  Physical Exam: Blood pressure 145/81, pulse 130, temperature 97.5 F (36.4 C), temperature source Oral, resp. rate 20, SpO2 96.00%.  General: Well developed, well nourished, female in no acute distress Head: Eyes PERRLA, No xanthomas.   Normocephalic and atraumatic, oropharynx without edema or exudate. Dentition: Good Lungs: Clear, diminished in bilateral bases, even and unlabored. Heart: Irregularly irregular, S1 S2, no rub/gallop, murmur, pulses are 2+in BUEs, and 1+ in BLEs.   Neck: bilateral carotid bruits. No lymphadenopathy.  No JVD. Abdomen: Bowel sounds present, abdomen soft and non-tender without masses or hernias noted. Msk:  No spine or cva tenderness. No weakness, no joint deformities or effusions. Extremities: No clubbing or cyanosis. No edema.  Neuro: Alert and oriented X 3. No focal deficits noted. Psych:  Good affect, responds appropriately Skin: No rashes or lesions noted.  Labs:   Lab Results  Component Value Date   WBC 7.7 06/13/2012   HGB 13.7 06/13/2012   HCT 40.1 06/13/2012   MCV 93.5 06/13/2012   PLT 212 06/13/2012    Recent Labs Lab 06/13/12 1504  NA 141  K 3.9  CL 103  CO2 25  BUN 13  CREATININE 0.72  CALCIUM 9.8  GLUCOSE 103*    Recent Labs  06/13/12 1528  TROPIPOC 0.07    Echo: 01/03/2008 SUMMARY - Overall left ventricular systolic function was normal. Left ventricular ejection fraction was estimated , range being 55 % to 60 %. There were no left ventricular regional wall motion abnormalities. There was mild focal basal septal hypertrophy. Doppler parameters were consistent with high left ventricular filling pressure. - The  aortic valve was mildly calcified. - There was mild mitral annular calcification. There was moderate mitral valvular regurgitation. Mitral regurgitation grade was 2+ on a scale of 0 to 4+. The effective orifice of mitral regurgitation by proximal isovelocity surface area was 0.06 cm^2. The volume of mitral regurgitation by proximal isovelocity surface area was 14 cc. - The left atrium was mildly dilated. - The estimated peak pulmonary artery systolic pressure was mildly increased.  ECG: 13-Jun-2012 14:10:47 [facility]/ED ROUTINE RECORD ** * PID / VISIT MISMATCH * ** * ** Critical Test Result: Long  QTc Accelerated Junctional rhythm Nonspecific ST and T wave abnormality Abnormal ECG 70mm/s 62mm/mV 100Hz  8.0.1 12SL 241 HD CID: 16 Referred by: Unconfirmed Vent. rate 128 BPM PR interval * ms QRS duration 80 ms QT/QTc 394/575 ms P-R-T axes * 73 69    Radiology:  Dg Chest Port 1 View 06/13/2012  *RADIOLOGY REPORT*  Clinical Data: Shortness of breath  PORTABLE CHEST - 1 VIEW  Comparison: 02/14/2012  Findings: Chronic interstitial markings.  No focal consolidation. No pleural effusion or pneumothorax.  Cardiomediastinal silhouette is within normal limits.  IMPRESSION: No evidence of acute cardiopulmonary disease.   Original Report Authenticated By: Charline Bills, M.D.     ASSESSMENT AND PLAN:   The patient was seen today by Dr Eden Emms, the patient evaluated and the data reviewed.  Active Problems:   * No active hospital problems. *  1. Possible Angina: Present with CP with activity that is mildly relieved by rest and SL Nitro. She is unable to state if she is currently experiencing CP but appears in no apparent distress. Continue home ASA, BB, and Statin therapies. Will not cycle enzymes unless recurrent pain.  2. Possible Pulmonary Embolism: She complains of bilateral lower extremity pain to palpation. No swelling noted upon exam however concern existed for possible PE.  D-Dimer was negative so no further eval for now. Consider LE Dopplers if there is concern for DVT.   3. CAD: Cath 02/10/10 that demonstrated: Coronary artery disease status post prior percutaneous coronary intervention procedure with 30% in-stent restenosis in the proximal LAD, 70% to 80% stenosis in the mid-to-distal LAD, 70% to 80% stenosis in the midportion of the AV circumflex artery, which is a small vessel, and 95% stenosis in the mid-to-distal right coronary with normal LV function 2. Successful PCI of the lesion in the mid-to-distal right coronary artery using a PROMUS drug-eluting stent with improvement of stenosis from 95% to 0%. Previous history: 07/2007 PCI/Promus DES to LAD; 02/2010 PCI/Promus DES to dRCA. Continue home therapy.   4. HTN: BP elevated upon assessment. Continue home therapy regimen but increase enalapril to 10 mg daily. Give a dose of Lopressor 100 mg now.  5. HLD: Last LFTs drawn 11/13. Check Lipid and LFT panel and continue home Statin.  6. SOB - Pt mildly volume overloaded by exam. Will give 1 dose of IV Lasix 20 mg and follow I/O, weight. Recheck BMET in am. Further volume management PRN.   Patient examined chart reviewed.  ECG from primary suggests possible flutter.  Appears to be sinus with first degree block on telemetry No change with carotid massage.  Continue lopressor 100 bid.  Telemetry  BNP mildly elevated Lasix x 1 and reassees in am.  Echo and complete labs with TSH  Charlton Haws

## 2012-06-13 NOTE — ED Notes (Signed)
RN on 2000 to call for report when out of floor report.

## 2012-06-13 NOTE — ED Provider Notes (Signed)
History     CSN: [id number]  Arrival date & time 06/13/12  1404   First MD Initiated Contact with Patient 06/13/12 1418      Chief Complaint  Patient presents with  . Shortness of Breath    (Consider location/radiation/quality/duration/timing/severity/associated sxs/prior treatment) HPI Comments: Pt comes in with cc of feeling unwell. Pt has known CAD and a recent cath. Pt states that she just hasnt felt well in a month. She has been having worsening dyspnea - not specifically related to exertion. Pt denies nausea, emesis, fevers, chills, headaches, dizziness. She does admit to some tremors with the weakness and some lower abd pain, with polyuria last night.  Records indicate: "Cardiac cath demonstrated some disease in sub 2mm vessels OM and diagonal, but these appeared similar to 2011 and not likely to benefit from intervention. Her two stents were widely patent. EF was hyperdynamic and LVEDP was not terribly high. Med rx was recommended.  Patient is a 79 y.o. female presenting with shortness of breath. The history is provided by the patient and medical records.  Shortness of Breath Associated symptoms: no abdominal pain, no chest pain, no cough, no neck pain, no vomiting and no wheezing     Past Medical History  Diagnosis Date  . CAD (coronary artery disease)     a. 07/2007 PCI/Promus DES to LAD;  b. 02/2010 PCI/Promus DES to dRCA.;  c. 12/2010 Lexi MV: EF 88%, no isch/infarct.;  d. 02/2012 Cath: LM nl, LAD 20p, patent mid stent, 60d, D1 sm 70ost, D2 sm, diff, LCX sm , OM1 diff 72m (no change), RCA 40ost, patent stent, PD/PL nl, EF 75% w/o rwma's.  Marland Kitchen HTN (hypertension)   . Hypothyroidism   . Diverticulosis   . IBS (irritable bowel syndrome)   . Carotid bruit   . HLD (hyperlipidemia)   . GERD (gastroesophageal reflux disease)   . Hemorrhoids     Past Surgical History  Procedure Laterality Date  . Back surgery    . Hysterectomy - unknown type    . Rectocele repair    .  Coronary angioplasty with stent placement      Family History  Problem Relation Age of Onset  . Myasthenia gravis    . Hip fracture    . Heart disease      History  Substance Use Topics  . Smoking status: Never Smoker   . Smokeless tobacco: Not on file  . Alcohol Use: No    OB History   Grav Para Term Preterm Abortions TAB SAB Ect Mult Living                  Review of Systems  Constitutional: Negative for activity change.  HENT: Negative for facial swelling and neck pain.   Respiratory: Positive for shortness of breath. Negative for cough and wheezing.   Cardiovascular: Negative for chest pain.  Gastrointestinal: Negative for nausea, vomiting, abdominal pain, diarrhea, constipation, blood in stool and abdominal distention.  Genitourinary: Positive for frequency. Negative for hematuria and difficulty urinating.  Skin: Negative for color change.  Neurological: Negative for speech difficulty.  Hematological: Does not bruise/bleed easily.  Psychiatric/Behavioral: Negative for confusion.    Allergies  Review of patient's allergies indicates no known allergies.  Home Medications   Current Outpatient Rx  Name  Route  Sig  Dispense  Refill  . aspirin 81 MG tablet   Oral   Take 1 tablet (81 mg total) by mouth daily.         Marland Kitchen  Calcium Carbonate-Vitamin D (CALCIUM 600+D) 600-400 MG-UNIT per tablet   Oral   Take 1 tablet by mouth daily.           . enalapril (VASOTEC) 5 MG tablet   Oral   Take 5 mg by mouth daily.         Marland Kitchen levothyroxine (SYNTHROID, LEVOTHROID) 100 MCG tablet   Oral   Take 100 mcg by mouth daily.           . metoprolol (LOPRESSOR) 100 MG tablet   Oral   Take 100 mg by mouth 2 (two) times daily.         . Multiple Vitamin (MULTIVITAMIN) tablet   Oral   Take 1 tablet by mouth daily.           . nitroGLYCERIN (NITROSTAT) 0.4 MG SL tablet   Sublingual   Place 0.4 mg under the tongue every 5 (five) minutes as needed. For chest pain          . simvastatin (ZOCOR) 20 MG tablet   Oral   Take 20 mg by mouth at bedtime.           . vitamin B-12 (CYANOCOBALAMIN) 500 MCG tablet   Oral   Take 500 mcg by mouth daily.           BP 145/81  Pulse 130  Temp(Src) 97.5 F (36.4 C) (Oral)  Resp 20  SpO2 96%  Physical Exam  Nursing note and vitals reviewed. Constitutional: She is oriented to person, place, and time. She appears well-developed.  HENT:  Head: Normocephalic and atraumatic.  Eyes: Conjunctivae and EOM are normal. Pupils are equal, round, and reactive to light.  Neck: Normal range of motion. Neck supple.  Cardiovascular: Normal rate, regular rhythm and normal heart sounds.   Pulmonary/Chest: Effort normal and breath sounds normal. No respiratory distress.  Abdominal: Soft. Bowel sounds are normal. She exhibits no distension. There is no tenderness. There is no rebound and no guarding.  Neurological: She is alert and oriented to person, place, and time.  Skin: Skin is warm and dry.    ED Course  Procedures (including critical care time)  Labs Reviewed  CBC  BASIC METABOLIC PANEL  PRO B NATRIURETIC PEPTIDE  POCT I-STAT TROPONIN I   Dg Chest Port 1 View  06/13/2012  *RADIOLOGY REPORT*  Clinical Data: Shortness of breath  PORTABLE CHEST - 1 VIEW  Comparison: 02/14/2012  Findings: Chronic interstitial markings.  No focal consolidation. No pleural effusion or pneumothorax.  Cardiomediastinal silhouette is within normal limits.  IMPRESSION: No evidence of acute cardiopulmonary disease.   Original Report Authenticated By: Charline Bills, M.D.      No diagnosis found.    MDM   Date: 06/13/2012  Rate: 128  Rhythm: indeterminate - junctional tachycardia  QRS Axis: left  Intervals: normal  ST/T Wave abnormalities: nonspecific ST/T changes  Conduction Disutrbances:none  Narrative Interpretation:   Old EKG Reviewed: changes noted  Differential diagnosis includes: ACS syndrome CHF  exacerbation Valvular disorder Myocarditis Pericarditis Pericardial effusion Pneumonia Pleural effusion Pulmonary edema PE Anemia Musculoskeletal pain  Pt comes in woth cc of of weakness, dib. Has known CAD, that is being medically managed. PCP requests visit to the ED, and he has notified Cards about the patient - so we will consult them.  5:30 PM   She has had neg v/q scan with an equivocal CT PE recentky (due to motion effects). Cards wants a dimer - so we have ordered  it. Pt has been started on heparin per cards request.  Derwood Kaplan, MD 06/13/12 1730

## 2012-06-13 NOTE — Progress Notes (Signed)
ANTICOAGULATION CONSULT NOTE - Initial Consult  Pharmacy Consult for heparin Indication: chest pain/ACS  No Known Allergies  Patient Measurements:   Heparin Dosing Weight: 64kg  Vital Signs: Temp: 97.5 F (36.4 C) (03/05 1408) Temp src: Oral (03/05 1408) BP: 145/81 mmHg (03/05 1408) Pulse Rate: 130 (03/05 1408)  Labs:  Recent Labs  06/13/12 1504  HGB 13.7  HCT 40.1  PLT 212  CREATININE 0.72    The CrCl is unknown because both a height and weight (above a minimum accepted value) are required for this calculation.   Medical History: Past Medical History  Diagnosis Date  . CAD (coronary artery disease)     a. 07/2007 PCI/Promus DES to LAD;  b. 02/2010 PCI/Promus DES to dRCA.;  c. 12/2010 Lexi MV: EF 88%, no isch/infarct.;  d. 02/2012 Cath: LM nl, LAD 20p, patent mid stent, 60d, D1 sm 70ost, D2 sm, diff, LCX sm , OM1 diff 81m (no change), RCA 40ost, patent stent, PD/PL nl, EF 75% w/o rwma's.  Marland Kitchen HTN (hypertension)   . Hypothyroidism   . Diverticulosis   . IBS (irritable bowel syndrome)   . Carotid bruit   . HLD (hyperlipidemia)   . GERD (gastroesophageal reflux disease)   . Hemorrhoids     Medications:  Infusions:  . heparin    . heparin      Assessment: 36 yof presented to the ED with SOB and CP. To start IV heparin for anticoagulation. Baseline CBC is WNL. She was not on any anticoagulations PTA.   Goal of Therapy:  Heparin level 0.3-0.7 units/ml Monitor platelets by anticoagulation protocol: Yes   Plan:  1. Heparin bolus 4000 units IV x 1  2. Heparin gtt 750units/hr 3. Check an 8 hour heparin level 4. Daily heparin level and CBC  Renell Coaxum, Drake Leach 06/13/2012,5:21 PM

## 2012-06-13 NOTE — ED Notes (Signed)
Pt reports having sob and and palpitations, feeling "jittery" denies any cp. ekg done at triage, HR 130's.

## 2012-06-14 DIAGNOSIS — R0789 Other chest pain: Secondary | ICD-10-CM | POA: Diagnosis present

## 2012-06-14 DIAGNOSIS — R072 Precordial pain: Secondary | ICD-10-CM

## 2012-06-14 DIAGNOSIS — I200 Unstable angina: Secondary | ICD-10-CM

## 2012-06-14 LAB — COMPREHENSIVE METABOLIC PANEL
ALT: 12 U/L (ref 0–35)
AST: 24 U/L (ref 0–37)
Albumin: 2.9 g/dL — ABNORMAL LOW (ref 3.5–5.2)
Alkaline Phosphatase: 61 U/L (ref 39–117)
BUN: 10 mg/dL (ref 6–23)
CO2: 28 mEq/L (ref 19–32)
Calcium: 9.2 mg/dL (ref 8.4–10.5)
Chloride: 104 mEq/L (ref 96–112)
Creatinine, Ser: 0.71 mg/dL (ref 0.50–1.10)
GFR calc Af Amer: 86 mL/min — ABNORMAL LOW (ref >90)
GFR calc non Af Amer: 74 mL/min — ABNORMAL LOW (ref >90)
Glucose, Bld: 88 mg/dL (ref 70–99)
Potassium: 3.8 mEq/L (ref 3.5–5.1)
Sodium: 138 mEq/L (ref 135–145)
Total Bilirubin: 0.5 mg/dL (ref 0.3–1.2)
Total Protein: 6.1 g/dL (ref 6.0–8.3)

## 2012-06-14 LAB — LIPID PANEL
Cholesterol: 123 mg/dL (ref 0–200)
HDL: 46 mg/dL (ref >39)
LDL Cholesterol: 54 mg/dL (ref 0–99)
Total CHOL/HDL Ratio: 2.7 RATIO
Triglycerides: 114 mg/dL (ref <150)
VLDL: 23 mg/dL (ref 0–40)

## 2012-06-14 LAB — TSH: TSH: 0.962 u[IU]/mL (ref 0.350–4.500)

## 2012-06-14 LAB — TROPONIN I
Troponin I: <0.3 ng/mL (ref <0.30)
Troponin I: <0.3 ng/mL (ref <0.30)

## 2012-06-14 MED ORDER — ENALAPRIL MALEATE 10 MG PO TABS: 10.0000 mg | ORAL_TABLET | Freq: Every day | ORAL | Status: DC

## 2012-06-14 NOTE — Discharge Summary (Signed)
See progress notes Brian Crenshaw  

## 2012-06-14 NOTE — Progress Notes (Signed)
   Subjective:  HA; denies CP or dyspnea; "weak"   Objective:  Filed Vitals:   06/13/12 2035 06/13/12 2038 06/13/12 2041 06/14/12 0521  BP: 179/93 172/88 164/86 130/73  Pulse: 95   93  Temp: 97.2 F (36.2 C)   98.2 F (36.8 C)  TempSrc: Oral   Oral  Resp: 18   18  Weight:    143 lb 11.2 oz (65.182 kg)  SpO2: 95%   95%    Intake/Output from previous day:  Intake/Output Summary (Last 24 hours) at 06/14/12 0716 Last data filed at 06/14/12 0500  Gross per 24 hour  Intake      0 ml  Output    200 ml  Net   -200 ml    Physical Exam: Physical exam: Well-developed well-nourished in no acute distress.  Skin is warm and dry.  HEENT is normal.  Neck is supple. No thyromegaly.  Chest is clear to auscultation with normal expansion.  Cardiovascular exam is regular rate and rhythm.  Abdominal exam nontender or distended. No masses palpated. Extremities show no edema. neuro grossly intact    Lab Results: Basic Metabolic Panel:  Recent Labs  16/10/96 1504 06/13/12 2102 06/14/12 0218  NA 141  --  138  K 3.9  --  3.8  CL 103  --  104  CO2 25  --  28  GLUCOSE 103*  --  88  BUN 13  --  10  CREATININE 0.72 0.71 0.71  CALCIUM 9.8  --  9.2   CBC:  Recent Labs  06/13/12 1504 06/13/12 2102  WBC 7.7 7.2  HGB 13.7 13.7  HCT 40.1 40.7  MCV 93.5 93.3  PLT 212 222   Cardiac Enzymes:  Recent Labs  06/13/12 2057 06/14/12 0218  TROPONINI <0.30 <0.30     Assessment/Plan:  1 CP - extremely difficult historian; "I felt shaky and bad all over; like I was having a heart attack"; unable to describe CP. Enzymes neg. Recent cath as outlined. Would favor medical therapy. Could consider myoview if symptoms recur. 2 CAD - continue ASA and statin. 3 hyperlipidemia - continue statin 4 hypertension - continue preadmission meds. Await echo and TSH; if unremarkable, DC today and fu with primary care and me. >30 min PA and physician time D2 Olga Millers 06/14/2012, 7:16  AM

## 2012-06-14 NOTE — Discharge Summary (Signed)
CARDIOLOGY DISCHARGE SUMMARY   Patient ID: [name] MRN: [id number] DOB/AGE: March 09, 1921 79 y.o.  Admit date: 06/13/2012 Discharge date: 06/14/2012  Primary Discharge Diagnosis:    Atypical chest pain Secondary Discharge Diagnosis:    HYPOTHYROIDISM   HYPERLIPIDEMIA   HYPERTENSION, BENIGN   CAD, NATIVE VESSEL  Procedures:  2-D echocardiogram  Hospital Course:  [name] is an 79 year old female with a history of coronary artery disease. She was feeling weak and having some chest pain. She saw her primary care physician and was sent to the hospital where she was admitted for further evaluation and treatment.  Her cardiac enzymes were negative for MI. She has a history of hypothyroidism so a TSH was checked but was within normal limits. Initially there was concern for atrial flutter. She was tachycardic and her ECG was unclear. She was continued on her beta blocker and once her heart rate slowed, she was in sinus rhythm. No significant arrhythmias were seen on telemetry overnight.   A lipid profile and liver function tests were performed. The results are below and no medication changes are planned. Other labs, including a CBC and a BMET showed no significant abnormalities. Her BNP had some elevation and she was mildly volume-overloaded on exam. She was given some IV Lasix.  After the IV Lasix, her heart rate improved and her blood pressure improved as well. Her oxygen level was 95% or greater on room air.  She was also hypertensive. Her enalapril was increased to 10 mg daily. She is to continue her other home medications and followup with Juline Patch, MD.  A 2-D echocardiogram was performed. The results are below. On 06/14/2012, [name] was evaluated by Dr. Jens Som. She still complained of some weakness but had improved. She was no longer having any chest pain. Dr. Jens Som evaluated [name] and considered her stable for discharge, to follow up as an outpatient.  Labs:     Lab Results  Component Value Date   WBC 7.2 06/13/2012   HGB 13.7 06/13/2012   HCT 40.7 06/13/2012   MCV 93.3 06/13/2012   PLT 222 06/13/2012     Recent Labs Lab 06/14/12 0218  NA 138  K 3.8  CL 104  CO2 28  BUN 10  CREATININE 0.71  CALCIUM 9.2  PROT 6.1  BILITOT 0.5  ALKPHOS 61  ALT 12  AST 24  GLUCOSE 88    Recent Labs  06/13/12 2057 06/14/12 0218  TROPONINI <0.30 <0.30   Lipid Panel     Component Value Date/Time   CHOL 123 06/14/2012 0218   TRIG 114 06/14/2012 0218   HDL 46 06/14/2012 0218   CHOLHDL 2.7 06/14/2012 0218   VLDL 23 06/14/2012 0218   LDLCALC 54 06/14/2012 0218    Pro B Natriuretic peptide (BNP)  Date/Time Value Range Status  06/13/2012  3:09 PM 672.0* 0 - 450 pg/mL Final  02/10/2010  8:50 PM 200.0* 0.0 - 100.0 pg/mL Final   Lab Results  Component Value Date   TSH 0.962 06/13/2012     Radiology: Dg Chest Port 1 View 06/13/2012  *RADIOLOGY REPORT*  Clinical Data: Shortness of breath  PORTABLE CHEST - 1 VIEW  Comparison: 02/14/2012  Findings: Chronic interstitial markings.  No focal consolidation. No pleural effusion or pneumothorax.  Cardiomediastinal silhouette is within normal limits.  IMPRESSION: No evidence of acute cardiopulmonary disease.   Original Report Authenticated By: Charline Bills, M.D.    EKG: 14-Jun-2012 05:22:37 [facility]-20 ROUTINE RECORD Sinus  rhythm with sinus arrhythmia with occasional Premature ventricular complexes Nonspecific T wave abnormality Abnormal ECG 20mm/s 57mm/mV 100Hz  8.0.1 12SL 241 HD CID: 1 Referred by: Unconfirmed Vent. rate 95 BPM PR interval 208 ms QRS duration 66 ms QT/QTc 362/454 ms P-R-T axes 66 45 -12  Echo: 06/14/2012 Conclusions - Left ventricle: The cavity size was normal. Wall thickness was increased in a pattern of mild LVH. Systolic function was vigorous. The estimated ejection fraction was in the range of 65% to 70%. Wall motion was normal; there were no regional wall motion  abnormalities. - Mitral valve: Calcified annulus. - Atrial septum: No defect or patent foramen ovale was identified.   FOLLOW UP PLANS AND APPOINTMENTS No Known Allergies   Medication List    TAKE these medications       aspirin 81 MG tablet  Take 1 tablet (81 mg total) by mouth daily.     CALCIUM 600+D 600-400 MG-UNIT per tablet  Generic drug:  Calcium Carbonate-Vitamin D  Take 1 tablet by mouth daily.     enalapril 10 MG tablet  Commonly known as:  VASOTEC  Take 1 tablet (10 mg total) by mouth daily.     levothyroxine 100 MCG tablet  Commonly known as:  SYNTHROID, LEVOTHROID  Take 100 mcg by mouth daily.     metoprolol 100 MG tablet  Commonly known as:  LOPRESSOR  Take 100 mg by mouth 2 (two) times daily.     multivitamin tablet  Take 1 tablet by mouth daily.     nitroGLYCERIN 0.4 MG SL tablet  Commonly known as:  NITROSTAT  Place 0.4 mg under the tongue every 5 (five) minutes as needed. For chest pain     simvastatin 20 MG tablet  Commonly known as:  ZOCOR  Take 20 mg by mouth at bedtime.     vitamin B-12 500 MCG tablet  Commonly known as:  CYANOCOBALAMIN  Take 500 mcg by mouth daily.         Future Appointments Provider Department Dept Phone   08/08/2012 12:30 PM Lewayne Bunting, MD [facility] Main Office [place]) [phone]     Follow-up Information   Schedule an appointment as soon as possible for a visit with Juline Patch, MD.   Contact information:   [address] [place] [address] [phone]       Follow up with Olga Millers, MD On 08/08/2012. (at 12:30 pm)    Contact information:   [address]                         [address] [place] [address] [phone]       BRING ALL MEDICATIONS WITH YOU TO FOLLOW UP APPOINTMENTS  Time spent with patient to include physician time: 38 min Signed: Theodore Demark 06/14/2012, 9:56 AM Co-Sign MD

## 2012-06-14 NOTE — Care Management Note (Addendum)
    Page 1 of 1   06/14/2012     3:49:51 PM   CARE MANAGEMENT NOTE 06/14/2012  Patient:  [name], [name]   Account Number:  [id number]  Date Initiated:  06/14/2012  Documentation initiated by:  Johnmichael Melhorn  Subjective/Objective Assessment:   PT ADM ON 06/13/12 WITH CHEST PAIN.  PTA, PT RESIDES AT HOME ALONE AND IS INDEPENDENT.     Action/Plan:   WILL FOLLOW FOR HOME NEEDS AS PT PROGRESSES.   Anticipated DC Date:  06/14/2012   Anticipated DC Plan:  HOME/SELF CARE      DC Planning Services  CM consult      Choice offered to / List presented to:             Status of service:  Completed, signed off Medicare Important Message given?   (If response is "NO", the following Medicare IM given date fields will be blank) Date Medicare IM given:   Date Additional Medicare IM given:    Discharge Disposition:  HOME/SELF CARE  Per UR Regulation:  Reviewed for med. necessity/level of care/duration of stay  If discussed at Long Length of Stay Meetings, dates discussed:    Comments:

## 2012-06-14 NOTE — Progress Notes (Signed)
  Echocardiogram 2D Echocardiogram has been performed.  Georgian Co 06/14/2012, 11:15 AM

## 2012-06-19 ENCOUNTER — Telehealth: Payer: Self-pay

## 2012-06-19 NOTE — Telephone Encounter (Signed)
Pt to contact her pcp for a hospital f/u per Dr Graciela Husbands.  She agrees.

## 2012-06-19 NOTE — Telephone Encounter (Signed)
[name] was called as a TCM call.  She states she is still feeling weak and shaky.  She states it has not gotten any better.  She states her daughter has been giving her some "yogi" and "emergenC" to calm her down which helps for a while.  She is aware of her appt with Dr Jens Som and states she has not seen Dr Ricki Miller and does not have an appt with him because she states Dr Ricki Miller told her to just walk in to be seen.  I called Dr Lynne Logan office who states that pt needs to call for an appt and not just walk in.  This message was relayed to the patient.  She states she does have all her meds.

## 2012-08-03 ENCOUNTER — Encounter: Payer: Medicare Other | Admitting: Cardiology

## 2012-08-08 ENCOUNTER — Ambulatory Visit (INDEPENDENT_AMBULATORY_CARE_PROVIDER_SITE_OTHER): Payer: Medicare Other | Admitting: Cardiology

## 2012-08-08 ENCOUNTER — Encounter: Payer: Self-pay | Admitting: Cardiology

## 2012-08-08 VITALS — BP 150/80 | HR 90 | Wt 137.0 lb

## 2012-08-08 DIAGNOSIS — E785 Hyperlipidemia, unspecified: Secondary | ICD-10-CM

## 2012-08-08 DIAGNOSIS — I2510 Atherosclerotic heart disease of native coronary artery without angina pectoris: Secondary | ICD-10-CM

## 2012-08-08 DIAGNOSIS — I10 Essential (primary) hypertension: Secondary | ICD-10-CM

## 2012-08-08 NOTE — Assessment & Plan Note (Signed)
Continue statin. 

## 2012-08-08 NOTE — Patient Instructions (Addendum)
Your physician wants you to follow-up in: 6 MONTHS WITH DR CRENSHAW You will receive a reminder letter in the mail two months in advance. If you don't receive a letter, please call our office to schedule the follow-up appointment.  

## 2012-08-08 NOTE — Progress Notes (Signed)
HPI: Pleasant female for fu of CAD.  She has a h/o severe HTN, IBS and CAD with hx of PCI to the pLAD in 4/09 with a Palmaz DES and Promus DES to dRCA in 11/11. LHC 02/23/12 performed for chest pain and dyspnea: pLAD 20%, mLAD stent patent, dLAD 60%, oD1 70% (small), small CFX with one AV groove OM1 branch with mid diffuse 80%-no change from 2011, oRCA 40%, dRCA stent patent, EF 75% - medical Rx recommended. Echocardiogram in March of 2014 showed normal LV function and mild left ventricular hypertrophy. Patient admitted in March of 2014 with chest pain. Enzymes negative. Since discharge, the patient has dyspnea with more extreme activities but not with routine activities. It is relieved with rest. It is not associated with chest pain. There is no orthopnea, PND or pedal edema. There is no syncope or palpitations. There is no exertional chest pain.     Current Outpatient Prescriptions  Medication Sig Dispense Refill  . ALPRAZolam (XANAX) 0.25 MG tablet Take 0.25 mg by mouth at bedtime as needed for sleep.      Marland Kitchen aspirin 81 MG tablet Take 1 tablet (81 mg total) by mouth daily.      . Calcium Carbonate-Vitamin D (CALCIUM 600+D) 600-400 MG-UNIT per tablet Take 1 tablet by mouth daily.        . enalapril (VASOTEC) 10 MG tablet Take 1 tablet (10 mg total) by mouth daily.  30 tablet  11  . levothyroxine (SYNTHROID, LEVOTHROID) 100 MCG tablet Take 100 mcg by mouth daily.        . metoprolol (LOPRESSOR) 100 MG tablet Take 100 mg by mouth 2 (two) times daily.      . Multiple Vitamin (MULTIVITAMIN) tablet Take 1 tablet by mouth daily.        . nitroGLYCERIN (NITROSTAT) 0.4 MG SL tablet Place 0.4 mg under the tongue every 5 (five) minutes as needed. For chest pain      . simvastatin (ZOCOR) 20 MG tablet Take 20 mg by mouth at bedtime.        . vitamin B-12 (CYANOCOBALAMIN) 500 MCG tablet Take 500 mcg by mouth daily.       No current facility-administered medications for this visit.     Past Medical  History  Diagnosis Date  . CAD (coronary artery disease)     a. 07/2007 PCI/Promus DES to LAD;  b. 02/2010 PCI/Promus DES to dRCA.;  c. 12/2010 Lexi MV: EF 88%, no isch/infarct.;  d. 02/2012 Cath: LM nl, LAD 20p, patent mid stent, 60d, D1 sm 70ost, D2 sm, diff, LCX sm , OM1 diff 68m (no change), RCA 40ost, patent stent, PD/PL nl, EF 75% w/o rwma's.  Marland Kitchen HTN (hypertension)   . Hypothyroidism   . Diverticulosis   . IBS (irritable bowel syndrome)   . Carotid bruit   . HLD (hyperlipidemia)   . GERD (gastroesophageal reflux disease)   . Hemorrhoids     Past Surgical History  Procedure Laterality Date  . Back surgery    . Hysterectomy - unknown type    . Rectocele repair    . Coronary angioplasty with stent placement      History   Social History  . Marital Status: Widowed    Spouse Name: N/A    Number of Children: 4  . Years of Education: N/A   Occupational History  . retired    Social History Main Topics  . Smoking status: Never Smoker   . Smokeless tobacco: Not  on file  . Alcohol Use: No  . Drug Use: No  . Sexually Active: Not on file   Other Topics Concern  . Not on file   Social History Narrative   Lives in [place].    ROS: no fevers or chills, productive cough, hemoptysis, dysphasia, odynophagia, melena, hematochezia, dysuria, hematuria, rash, seizure activity, orthopnea, PND, pedal edema, claudication. Remaining systems are negative.  Physical Exam: Well-developed well-nourished in no acute distress.  Skin is warm and dry.  HEENT is normal.  Neck is supple.  Chest is clear to auscultation with normal expansion.  Cardiovascular exam is regular rate and rhythm.  Abdominal exam nontender or distended. No masses palpated. Extremities show no edema. neuro grossly intact

## 2012-08-08 NOTE — Assessment & Plan Note (Signed)
Continue aspirin and statin. 

## 2012-08-08 NOTE — Assessment & Plan Note (Signed)
Continue present blood pressure medications. 

## 2012-10-29 ENCOUNTER — Other Ambulatory Visit: Payer: Self-pay | Admitting: *Deleted

## 2012-10-29 MED ORDER — ENALAPRIL MALEATE 10 MG PO TABS: 10.0000 mg | ORAL_TABLET | Freq: Every day | ORAL | Status: DC

## 2012-10-29 NOTE — Telephone Encounter (Signed)
Refill sent.

## 2013-02-28 ENCOUNTER — Encounter: Payer: Self-pay | Admitting: Cardiology

## 2013-02-28 ENCOUNTER — Ambulatory Visit (INDEPENDENT_AMBULATORY_CARE_PROVIDER_SITE_OTHER): Payer: Medicare Other | Admitting: Cardiology

## 2013-02-28 VITALS — BP 128/96 | HR 64 | Ht 65.0 in | Wt 138.0 lb

## 2013-02-28 DIAGNOSIS — I10 Essential (primary) hypertension: Secondary | ICD-10-CM

## 2013-02-28 DIAGNOSIS — E785 Hyperlipidemia, unspecified: Secondary | ICD-10-CM

## 2013-02-28 DIAGNOSIS — I2510 Atherosclerotic heart disease of native coronary artery without angina pectoris: Secondary | ICD-10-CM

## 2013-02-28 NOTE — Patient Instructions (Signed)
Your physician wants you to follow-up in: 6 MONTHS WITH DR CRENSHAW You will receive a reminder letter in the mail two months in advance. If you don't receive a letter, please call our office to schedule the follow-up appointment.  

## 2013-02-28 NOTE — Progress Notes (Signed)
HPI: Fu CAD. She has a h/o severe HTN and hx of PCI to the pLAD in 4/09 with a Palmaz DES and Promus DES to dRCA in 11/11. LHC 02/23/12 performed for chest pain and dyspnea: pLAD 20%, mLAD stent patent, dLAD 60%, oD1 70% (small), small CFX with one AV groove OM1 branch with mid diffuse 80%-no change from 2011, oRCA 40%, dRCA stent patent, EF 75% - medical Rx recommended. Last echocardiogram in March 2014 showed normal LV function, mild left ventricular hypertrophy. Patient admitted in March of 2014 with chest pain and ruled out. Medical therapy recommended. Since that time the patient has dyspnea with more extreme activities but not with routine activities. It is relieved with rest. It is not associated with chest pain. There is no orthopnea, PND or pedal edema. There is no syncope or palpitations. There is no exertional chest pain.    Current Outpatient Prescriptions  Medication Sig Dispense Refill  . ALPRAZolam (XANAX) 0.25 MG tablet Take 0.25 mg by mouth at bedtime as needed for sleep.      Marland Kitchen aspirin 81 MG tablet Take 1 tablet (81 mg total) by mouth daily.      . Calcium Carbonate-Vitamin D (CALCIUM 600+D) 600-400 MG-UNIT per tablet Take 1 tablet by mouth daily.        . enalapril (VASOTEC) 10 MG tablet Take 1 tablet (10 mg total) by mouth daily.  90 tablet  3  . levothyroxine (SYNTHROID, LEVOTHROID) 100 MCG tablet Take 100 mcg by mouth daily.        . metoprolol (LOPRESSOR) 100 MG tablet Take 100 mg by mouth 2 (two) times daily.      . Multiple Vitamin (MULTIVITAMIN) tablet Take 1 tablet by mouth daily.        . nitroGLYCERIN (NITROSTAT) 0.4 MG SL tablet Place 0.4 mg under the tongue every 5 (five) minutes as needed. For chest pain      . simvastatin (ZOCOR) 20 MG tablet Take 20 mg by mouth at bedtime.        . vitamin B-12 (CYANOCOBALAMIN) 500 MCG tablet Take 500 mcg by mouth daily.       No current facility-administered medications for this visit.     Past Medical History    Diagnosis Date  . CAD (coronary artery disease)     a. 07/2007 PCI/Promus DES to LAD;  b. 02/2010 PCI/Promus DES to dRCA.;  c. 12/2010 Lexi MV: EF 88%, no isch/infarct.;  d. 02/2012 Cath: LM nl, LAD 20p, patent mid stent, 60d, D1 sm 70ost, D2 sm, diff, LCX sm , OM1 diff 74m (no change), RCA 40ost, patent stent, PD/PL nl, EF 75% w/o rwma's.  Marland Kitchen HTN (hypertension)   . Hypothyroidism   . Diverticulosis   . IBS (irritable bowel syndrome)   . Carotid bruit   . HLD (hyperlipidemia)   . GERD (gastroesophageal reflux disease)   . Hemorrhoids     Past Surgical History  Procedure Laterality Date  . Back surgery    . Hysterectomy - unknown type    . Rectocele repair    . Coronary angioplasty with stent placement      History   Social History  . Marital Status: Widowed    Spouse Name: N/A    Number of Children: 4  . Years of Education: N/A   Occupational History  . retired    Social History Main Topics  . Smoking status: Never Smoker   . Smokeless tobacco: Not on  file  . Alcohol Use: No  . Drug Use: No  . Sexual Activity: Not on file   Other Topics Concern  . Not on file   Social History Narrative   Lives in [place].    ROS: no fevers or chills, productive cough, hemoptysis, dysphasia, odynophagia, melena, hematochezia, dysuria, hematuria, rash, seizure activity, orthopnea, PND, pedal edema, claudication. Remaining systems are negative.  Physical Exam: Well-developed well-nourished in no acute distress.  Skin is warm and dry.  HEENT is normal.  Neck is supple.  Chest is clear to auscultation with normal expansion.  Cardiovascular exam is regular rate and rhythm.  Abdominal exam nontender or distended. No masses palpated. Extremities show no edema. neuro grossly intact  ECG sinus rhythm with first degree AV block. No ST changes.

## 2013-02-28 NOTE — Assessment & Plan Note (Signed)
Continue statin.lipids and liver monitored by primary care. 

## 2013-02-28 NOTE — Assessment & Plan Note (Signed)
Continue aspirin and statin. Conservative measures given age if possible.

## 2013-02-28 NOTE — Assessment & Plan Note (Signed)
Continue present blood pressure medications. Potassium and renal function monitored by primary care. 

## 2013-06-24 ENCOUNTER — Ambulatory Visit: Payer: Medicare Other | Attending: Internal Medicine | Admitting: Physical Therapy

## 2013-06-24 DIAGNOSIS — R269 Unspecified abnormalities of gait and mobility: Secondary | ICD-10-CM | POA: Insufficient documentation

## 2013-06-24 DIAGNOSIS — IMO0001 Reserved for inherently not codable concepts without codable children: Secondary | ICD-10-CM | POA: Insufficient documentation

## 2013-06-26 ENCOUNTER — Ambulatory Visit: Payer: Medicare Other | Admitting: Physical Therapy

## 2013-07-01 ENCOUNTER — Ambulatory Visit: Payer: Medicare Other | Admitting: Physical Therapy

## 2013-07-03 ENCOUNTER — Ambulatory Visit: Payer: Medicare Other | Admitting: Physical Therapy

## 2013-07-09 ENCOUNTER — Ambulatory Visit: Payer: Medicare Other | Admitting: Physical Therapy

## 2013-07-11 ENCOUNTER — Ambulatory Visit: Payer: Medicare Other | Attending: Internal Medicine | Admitting: Physical Therapy

## 2013-07-11 DIAGNOSIS — R269 Unspecified abnormalities of gait and mobility: Secondary | ICD-10-CM | POA: Insufficient documentation

## 2013-07-11 DIAGNOSIS — IMO0001 Reserved for inherently not codable concepts without codable children: Secondary | ICD-10-CM | POA: Insufficient documentation

## 2013-07-15 ENCOUNTER — Ambulatory Visit: Payer: Medicare Other | Admitting: Physical Therapy

## 2013-07-17 ENCOUNTER — Emergency Department (HOSPITAL_COMMUNITY)
Admission: EM | Admit: 2013-07-17 | Discharge: 2013-07-17 | Disposition: A | Discharge status: Home / Self Care | Payer: Medicare Other | Attending: Emergency Medicine | Admitting: Emergency Medicine

## 2013-07-17 ENCOUNTER — Ambulatory Visit: Payer: Medicare Other | Admitting: Physical Therapy

## 2013-07-17 ENCOUNTER — Encounter (HOSPITAL_COMMUNITY): Payer: Self-pay | Admitting: Emergency Medicine

## 2013-07-17 DIAGNOSIS — K219 Gastro-esophageal reflux disease without esophagitis: Secondary | ICD-10-CM | POA: Insufficient documentation

## 2013-07-17 DIAGNOSIS — Z79899 Other long term (current) drug therapy: Secondary | ICD-10-CM | POA: Insufficient documentation

## 2013-07-17 DIAGNOSIS — Z9861 Coronary angioplasty status: Secondary | ICD-10-CM | POA: Insufficient documentation

## 2013-07-17 DIAGNOSIS — Z9114 Patient's other noncompliance with medication regimen: Secondary | ICD-10-CM

## 2013-07-17 DIAGNOSIS — Z9119 Patient's noncompliance with other medical treatment and regimen: Secondary | ICD-10-CM | POA: Insufficient documentation

## 2013-07-17 DIAGNOSIS — Z91199 Patient's noncompliance with other medical treatment and regimen due to unspecified reason: Secondary | ICD-10-CM | POA: Insufficient documentation

## 2013-07-17 DIAGNOSIS — E039 Hypothyroidism, unspecified: Secondary | ICD-10-CM | POA: Insufficient documentation

## 2013-07-17 DIAGNOSIS — I2510 Atherosclerotic heart disease of native coronary artery without angina pectoris: Secondary | ICD-10-CM | POA: Insufficient documentation

## 2013-07-17 DIAGNOSIS — E785 Hyperlipidemia, unspecified: Secondary | ICD-10-CM | POA: Insufficient documentation

## 2013-07-17 DIAGNOSIS — I10 Essential (primary) hypertension: Secondary | ICD-10-CM | POA: Insufficient documentation

## 2013-07-17 DIAGNOSIS — Z7982 Long term (current) use of aspirin: Secondary | ICD-10-CM | POA: Insufficient documentation

## 2013-07-17 LAB — I-STAT CHEM 8, ED
BUN: 11 mg/dL (ref 6–23)
Calcium, Ion: 1.22 mmol/L (ref 1.13–1.30)
Chloride: 99 mEq/L (ref 96–112)
Creatinine, Ser: 0.9 mg/dL (ref 0.50–1.10)
Glucose, Bld: 92 mg/dL (ref 70–99)
HCT: 45 % (ref 36.0–46.0)
Hemoglobin: 15.3 g/dL — ABNORMAL HIGH (ref 12.0–15.0)
Potassium: 4.1 mEq/L (ref 3.7–5.3)
Sodium: 140 mEq/L (ref 137–147)
TCO2: 32 mmol/L (ref 0–100)

## 2013-07-17 MED ORDER — METOPROLOL TARTRATE 100 MG PO TABS: 100.0000 mg | ORAL_TABLET | Freq: Two times a day (BID) | ORAL | Status: DC

## 2013-07-17 MED ORDER — ENALAPRIL MALEATE 10 MG PO TABS: 10.0000 mg | ORAL_TABLET | Freq: Every day | ORAL | Status: DC

## 2013-07-17 MED ORDER — METOPROLOL TARTRATE 1 MG/ML IV SOLN
5.0000 mg | Freq: Once | INTRAVENOUS | Status: AC
  Administered 2013-07-17: 5 mg via INTRAVENOUS
  Filled 2013-07-17: qty 5

## 2013-07-17 MED ORDER — CLONIDINE HCL 0.1 MG PO TABS
0.1000 mg | ORAL_TABLET | Freq: Once | ORAL | Status: AC
  Administered 2013-07-17: 0.1 mg via ORAL
  Filled 2013-07-17: qty 1

## 2013-07-17 MED ORDER — ALPRAZOLAM 0.25 MG PO TABS: 0.2500 mg | ORAL_TABLET | Freq: Two times a day (BID) | ORAL | Status: DC | PRN

## 2013-07-17 NOTE — ED Notes (Signed)
Pt was downstairs to exercise at cardiac rehab.  Pt was sent here d/t HTN.  Pt reports that BP was 938 systolic.  Pt is alert and oriented.

## 2013-07-17 NOTE — ED Provider Notes (Signed)
I assumed care in signout Plan is to check BP after given BP is improved She is talking on phone Denies HA/CP/SOB/weakness/numbess No facial weakness No arm/leg drift.  No focal sensory deficit to light touch She feels comfortable for d/c home BP 181/106  Pulse 56  Temp(Src) 97.6 F (36.4 C) (Oral)  Resp 16  Ht 5\' 5"  (1.651 m)  Wt 138 lb (62.596 kg)  BMI 22.96 kg/m2  SpO2 97%   Sharyon Cable, MD 07/17/13 1733

## 2013-07-17 NOTE — ED Provider Notes (Signed)
CSN: [id number]     Arrival date & time 07/17/13  1150 History   First MD Initiated Contact with Patient 07/17/13 1259     Chief Complaint  Patient presents with  . Hypertension      HPI Pt was downstairs to exercise at cardiac rehab. Pt was sent here d/t HTN. Pt reports that BP was 403 systolic. Pt is alert and oriented.  Patient denies symptoms of chest pain or pressure, headache, weakness, shortness of breath, and states she feels her normal self.  Patient admits that she probably forgot to take her blood pressure medicine today and she normally takes.  Past Medical History  Diagnosis Date  . CAD (coronary artery disease)     a. 07/2007 PCI/Promus DES to LAD;  b. 02/2010 PCI/Promus DES to dRCA.;  c. 12/2010 Lexi MV: EF 88%, no isch/infarct.;  d. 02/2012 Cath: LM nl, LAD 20p, patent mid stent, 60d, D1 sm 70ost, D2 sm, diff, LCX sm , OM1 diff 68m (no change), RCA 40ost, patent stent, PD/PL nl, EF 75% w/o rwma's.  Marland Kitchen HTN (hypertension)   . Hypothyroidism   . Diverticulosis   . IBS (irritable bowel syndrome)   . Carotid bruit   . HLD (hyperlipidemia)   . GERD (gastroesophageal reflux disease)   . Hemorrhoids    Past Surgical History  Procedure Laterality Date  . Back surgery    . Hysterectomy - unknown type    . Rectocele repair    . Coronary angioplasty with stent placement     Family History  Problem Relation Age of Onset  . Myasthenia gravis Mother   . Hip fracture Father   . Heart disease      several uncles with CAD   History  Substance Use Topics  . Smoking status: Never Smoker   . Smokeless tobacco: Not on file  . Alcohol Use: No   OB History   Grav Para Term Preterm Abortions TAB SAB Ect Mult Living                 Review of Systems  All other systems reviewed and are negative  Allergies  Review of patient's allergies indicates no known allergies.  Home Medications   Current Outpatient Rx  Name  Route  Sig  Dispense  Refill  . aspirin 81 MG tablet    Oral   Take 1 tablet (81 mg total) by mouth daily.         . Calcium Carbonate-Vitamin D (CALCIUM 600+D) 600-400 MG-UNIT per tablet   Oral   Take 1 tablet by mouth daily.           Marland Kitchen levothyroxine (SYNTHROID, LEVOTHROID) 112 MCG tablet   Oral   Take 112 mcg by mouth daily before breakfast.         . Multiple Vitamin (MULTIVITAMIN) tablet   Oral   Take 1 tablet by mouth daily.           . simvastatin (ZOCOR) 20 MG tablet   Oral   Take 20 mg by mouth at bedtime.           . vitamin B-12 (CYANOCOBALAMIN) 500 MCG tablet   Oral   Take 500 mcg by mouth daily.         Marland Kitchen ALPRAZolam (XANAX) 0.25 MG tablet   Oral   Take 1 tablet (0.25 mg total) by mouth 2 (two) times daily as needed for anxiety.   30 tablet   0   .  enalapril (VASOTEC) 10 MG tablet   Oral   Take 1 tablet (10 mg total) by mouth daily.   90 tablet   3     Patient would like to have a 90 day supply   . metoprolol (LOPRESSOR) 100 MG tablet   Oral   Take 1 tablet (100 mg total) by mouth 2 (two) times daily.   30 tablet   0   . nitroGLYCERIN (NITROSTAT) 0.4 MG SL tablet   Sublingual   Place 0.4 mg under the tongue every 5 (five) minutes as needed. For chest pain          BP 187/56  Pulse 53  Temp(Src) 97.6 F (36.4 C) (Oral)  Resp 16  Ht 5\' 5"  (1.651 m)  Wt 138 lb (62.596 kg)  BMI 22.96 kg/m2  SpO2 96% Physical Exam  Nursing note and vitals reviewed. Constitutional: She is oriented to person, place, and time. She appears well-developed and well-nourished. No distress.  HENT:  Head: Normocephalic and atraumatic.  Eyes: Pupils are equal, round, and reactive to light.  Neck: Normal range of motion.  Cardiovascular: Normal rate and intact distal pulses.   Pulmonary/Chest: No respiratory distress.  Abdominal: Normal appearance. She exhibits no distension.  Musculoskeletal: Normal range of motion.  Neurological: She is alert and oriented to person, place, and time. No cranial nerve deficit.   Skin: Skin is warm and dry. No rash noted.  Psychiatric: She has a normal mood and affect. Her behavior is normal.    ED Course  Procedures (including critical care time)  Medications  metoprolol (LOPRESSOR) injection 5 mg (5 mg Intravenous Given 07/17/13 1445)  cloNIDine (CATAPRES) tablet 0.1 mg (0.1 mg Oral Given 07/17/13 1538)    Labs Review Labs Reviewed  I-STAT CHEM 8, ED - Abnormal; Notable for the following:    Hemoglobin 15.3 (*)    All other components within normal limits   Imaging Review No results found.    MDM   Final diagnoses:  Hypertension  Noncompliance with medication regimen        Dot Lanes, MD 07/20/13 1325

## 2013-07-17 NOTE — ED Notes (Signed)
MD at bedside. 

## 2013-07-17 NOTE — Discharge Instructions (Signed)
DASH Diet  The DASH diet stands for "Dietary Approaches to Stop Hypertension." It is a healthy eating plan that has been shown to reduce high blood pressure (hypertension) in as little as 14 days, while also possibly providing other significant health benefits. These other health benefits include reducing the risk of breast cancer after menopause and reducing the risk of type 2 diabetes, heart disease, colon cancer, and stroke. Health benefits also include weight loss and slowing kidney failure in patients with chronic kidney disease.   DIET GUIDELINES  · Limit salt (sodium). Your diet should contain less than 1500 mg of sodium daily.  · Limit refined or processed carbohydrates. Your diet should include mostly whole grains. Desserts and added sugars should be used sparingly.  · Include small amounts of heart-healthy fats. These types of fats include nuts, oils, and tub margarine. Limit saturated and trans fats. These fats have been shown to be harmful in the body.  CHOOSING FOODS   The following food groups are based on a 2000 calorie diet. See your Registered Dietitian for individual calorie needs.  Grains and Grain Products (6 to 8 servings daily)  · Eat More Often: Whole-wheat bread, brown rice, whole-grain or wheat pasta, quinoa, popcorn without added fat or salt (air popped).  · Eat Less Often: White bread, white pasta, white rice, cornbread.  Vegetables (4 to 5 servings daily)  · Eat More Often: Fresh, frozen, and canned vegetables. Vegetables may be raw, steamed, roasted, or grilled with a minimal amount of fat.  · Eat Less Often/Avoid: Creamed or fried vegetables. Vegetables in a cheese sauce.  Fruit (4 to 5 servings daily)  · Eat More Often: All fresh, canned (in natural juice), or frozen fruits. Dried fruits without added sugar. One hundred percent fruit juice (½ cup [237 mL] daily).  · Eat Less Often: Dried fruits with added sugar. Canned fruit in light or heavy syrup.  Lean Meats, Fish, and Poultry (2  servings or less daily. One serving is 3 to 4 oz [85-114 g]).  · Eat More Often: Ninety percent or leaner ground beef, tenderloin, sirloin. Round cuts of beef, chicken breast, turkey breast. All fish. Grill, bake, or broil your meat. Nothing should be fried.  · Eat Less Often/Avoid: Fatty cuts of meat, turkey, or chicken leg, thigh, or wing. Fried cuts of meat or fish.  Dairy (2 to 3 servings)  · Eat More Often: Low-fat or fat-free milk, low-fat plain or light yogurt, reduced-fat or part-skim cheese.  · Eat Less Often/Avoid: Milk (whole, 2%). Whole milk yogurt. Full-fat cheeses.  Nuts, Seeds, and Legumes (4 to 5 servings per week)  · Eat More Often: All without added salt.  · Eat Less Often/Avoid: Salted nuts and seeds, canned beans with added salt.  Fats and Sweets (limited)  · Eat More Often: Vegetable oils, tub margarines without trans fats, sugar-free gelatin. Mayonnaise and salad dressings.  · Eat Less Often/Avoid: Coconut oils, palm oils, butter, stick margarine, cream, half and half, cookies, candy, pie.  FOR MORE INFORMATION  The Dash Diet Eating Plan: www.dashdiet.org  Document Released: 03/17/2011 Document Revised: 06/20/2011 Document Reviewed: 03/17/2011  Exit[employer]® Patient Information ©2014 [employer].

## 2013-07-22 ENCOUNTER — Ambulatory Visit: Payer: Medicare Other | Admitting: Physical Therapy

## 2013-07-24 ENCOUNTER — Ambulatory Visit: Payer: Medicare Other | Admitting: Physical Therapy

## 2013-08-01 ENCOUNTER — Ambulatory Visit: Payer: Medicare Other | Admitting: Physical Therapy

## 2013-08-02 ENCOUNTER — Ambulatory Visit: Payer: Medicare Other | Admitting: Physical Therapy

## 2013-08-22 ENCOUNTER — Other Ambulatory Visit: Payer: Self-pay | Admitting: Dermatology

## 2013-08-30 ENCOUNTER — Ambulatory Visit (INDEPENDENT_AMBULATORY_CARE_PROVIDER_SITE_OTHER): Payer: Medicare Other | Admitting: Cardiology

## 2013-08-30 ENCOUNTER — Encounter: Payer: Self-pay | Admitting: Cardiology

## 2013-08-30 VITALS — BP 100/72 | HR 66 | Ht 65.0 in | Wt 137.8 lb

## 2013-08-30 DIAGNOSIS — I2510 Atherosclerotic heart disease of native coronary artery without angina pectoris: Secondary | ICD-10-CM

## 2013-08-30 DIAGNOSIS — I10 Essential (primary) hypertension: Secondary | ICD-10-CM

## 2013-08-30 DIAGNOSIS — E785 Hyperlipidemia, unspecified: Secondary | ICD-10-CM

## 2013-08-30 NOTE — Progress Notes (Signed)
HPI: Fu CAD. She has a h/o severe HTN and hx of PCI to the pLAD in 4/09 with a Palmaz DES and Promus DES to [place] in 11/11. LHC 02/23/12 performed for chest pain and dyspnea: pLAD 20%, mLAD stent patent, dLAD 60%, oD1 70% (small), small CFX with one AV groove OM1 branch with mid diffuse 80%-no change from 2011, oRCA 40%, dRCA stent patent, EF 75% - medical Rx recommended. Last echocardiogram in March 2014 showed normal LV function, mild left ventricular hypertrophy. Patient admitted in March of 2014 with chest pain and ruled out. Medical therapy recommended. Last seen Nov 2014. Since then, She denies dyspnea, chest pain, palpitations or syncope.   Current Outpatient Prescriptions  Medication Sig Dispense Refill  . ALPRAZolam (XANAX) 0.25 MG tablet Take 1 tablet (0.25 mg total) by mouth 2 (two) times daily as needed for anxiety.  30 tablet  0  . amLODipine (NORVASC) 10 MG tablet       . aspirin 81 MG tablet Take 1 tablet (81 mg total) by mouth daily.      . Calcium Carbonate-Vitamin D (CALCIUM 600+D) 600-400 MG-UNIT per tablet Take 1 tablet by mouth daily.        . enalapril (VASOTEC) 10 MG tablet Take 1 tablet (10 mg total) by mouth daily.  90 tablet  3  . levothyroxine (SYNTHROID, LEVOTHROID) 112 MCG tablet Take 112 mcg by mouth daily before breakfast.      . metoprolol (LOPRESSOR) 100 MG tablet Take 1 tablet (100 mg total) by mouth 2 (two) times daily.  30 tablet  0  . Multiple Vitamin (MULTIVITAMIN) tablet Take 1 tablet by mouth daily.        . nitroGLYCERIN (NITROSTAT) 0.4 MG SL tablet Place 0.4 mg under the tongue every 5 (five) minutes as needed. For chest pain      . simvastatin (ZOCOR) 20 MG tablet Take 20 mg by mouth at bedtime.        . vitamin B-12 (CYANOCOBALAMIN) 500 MCG tablet Take 500 mcg by mouth daily.       No current facility-administered medications for this visit.     Past Medical History  Diagnosis Date  . CAD (coronary artery disease)     a. 07/2007 PCI/Promus  DES to LAD;  b. 02/2010 PCI/Promus DES to dRCA.;  c. 12/2010 Lexi MV: EF 88%, no isch/infarct.;  d. 02/2012 Cath: LM nl, LAD 20p, patent mid stent, 60d, D1 sm 70ost, D2 sm, diff, LCX sm , OM1 diff 48m (no change), RCA 40ost, patent stent, PD/PL nl, EF 75% w/o rwma's.  Marland Kitchen HTN (hypertension)   . Hypothyroidism   . Diverticulosis   . IBS (irritable bowel syndrome)   . Carotid bruit   . HLD (hyperlipidemia)   . GERD (gastroesophageal reflux disease)   . Hemorrhoids     Past Surgical History  Procedure Laterality Date  . Back surgery    . Hysterectomy - unknown type    . Rectocele repair    . Coronary angioplasty with stent placement      History   Social History  . Marital Status: Widowed    Spouse Name: N/A    Number of Children: 4  . Years of Education: N/A   Occupational History  . retired    Social History Main Topics  . Smoking status: Never Smoker   . Smokeless tobacco: Not on file  . Alcohol Use: No  . Drug Use: No  . Sexual  Activity: Not on file   Other Topics Concern  . Not on file   Social History Narrative   Lives in [place].    ROS: no fevers or chills, productive cough, hemoptysis, dysphasia, odynophagia, melena, hematochezia, dysuria, hematuria, rash, seizure activity, orthopnea, PND, pedal edema, claudication. Remaining systems are negative.  Physical Exam: Well-developed well-nourished in no acute distress.  Skin is warm and dry.  HEENT is normal.  Neck is supple.  Chest is clear to auscultation with normal expansion.  Cardiovascular exam is regular rate and rhythm.  Abdominal exam nontender or distended. No masses palpated. Extremities show no edema. neuro grossly intact  ECG Sinus rhythm at a rate of 66. First degree AV block.

## 2013-08-30 NOTE — Assessment & Plan Note (Signed)
Continue statin. 

## 2013-08-30 NOTE — Assessment & Plan Note (Signed)
Blood pressure is controlled.Continue present medications. I've asked her to follow her pressure and if it runs low we will decrease medications.

## 2013-08-30 NOTE — Assessment & Plan Note (Signed)
Continue aspirin and statin. 

## 2013-08-30 NOTE — Patient Instructions (Signed)
Your physician wants you to follow-up in: ONE YEAR WITH DR CRENSHAW You will receive a reminder letter in the mail two months in advance. If you don't receive a letter, please call our office to schedule the follow-up appointment.  

## 2013-09-18 IMAGING — CT CT ANGIO CHEST
2 of 6 series · 18 of 46 positions shown · IV contrast (APPLIED)
Comparison: 02/12/2012

***ADDENDUM*** CREATED: 02/15/2012 [DATE]

The study is technically adequate to evaluate for pulmonary embolus
and no pulmonary embolism is present.  The case was discussed with
Dr. Freund on the day of addendum at 2131 hours.
***END ADDENDUM*** SIGNED BY: Gargi Tiger, M.D.
CLINICAL DATA: Shortness of breath.  Recent cardiac
catheterization.
CT ANGIOGRAPHY CHEST
TECHNIQUE: Multidetector CT imaging of the chest using the
standard protocol during bolus administration of intravenous
contrast. Multiplanar reconstructed images including MIPs were
obtained and reviewed to evaluate the vascular anatomy.
Contrast: 100mL OMNIPAQUE IOHEXOL 350 MG/ML SOLN

[Series 6: pulm embolism 1.0 b25f thin · axial · 0.58mm/px · z∈[-335,-52]mm · 15 of 311 slices shown]
[im 14/311  lung]
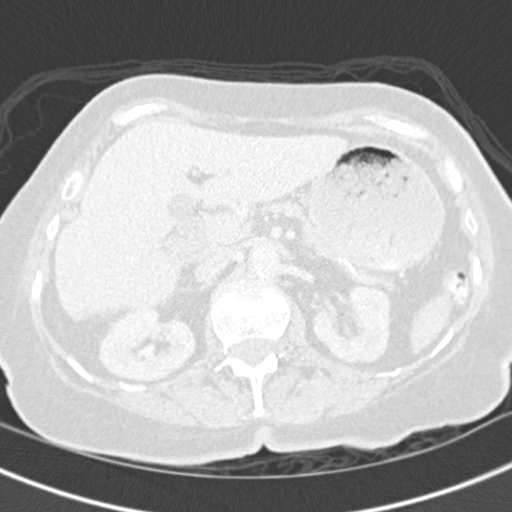
[im 41/311  soft-tissue]
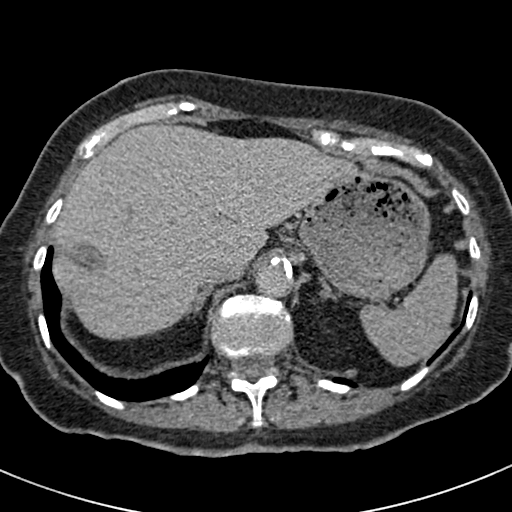
[im 54/311  lung]
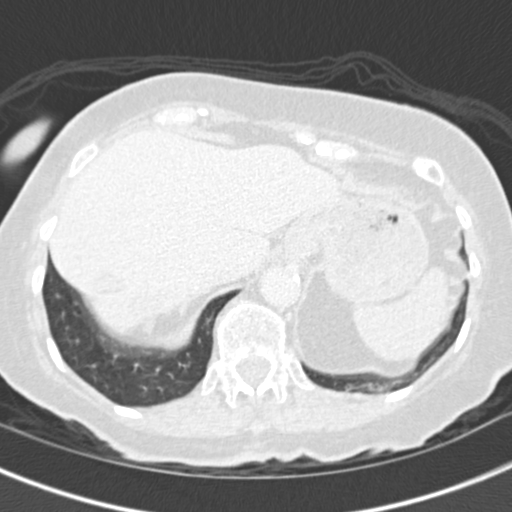
[im 81/311  soft-tissue]
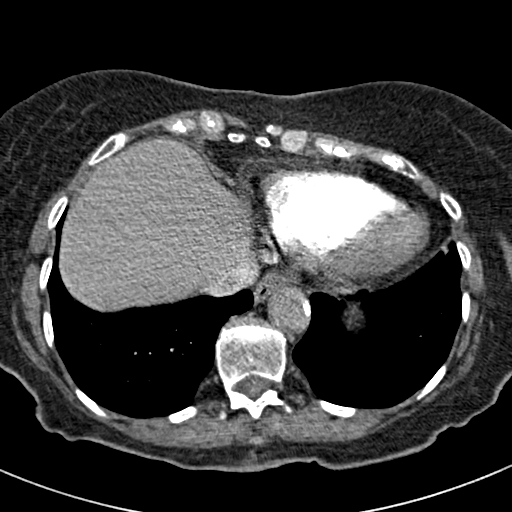
[im 95/311  lung]
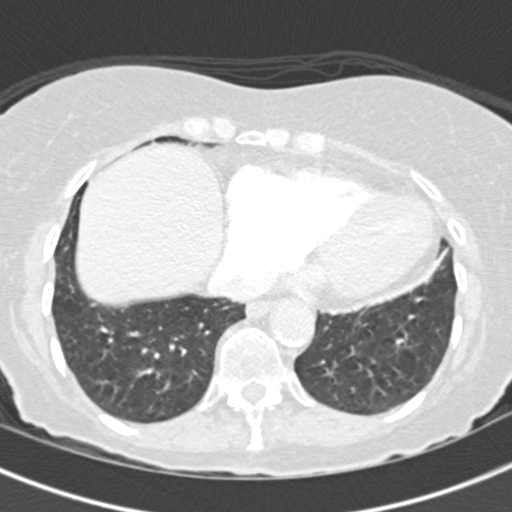
[im 122/311  soft-tissue]
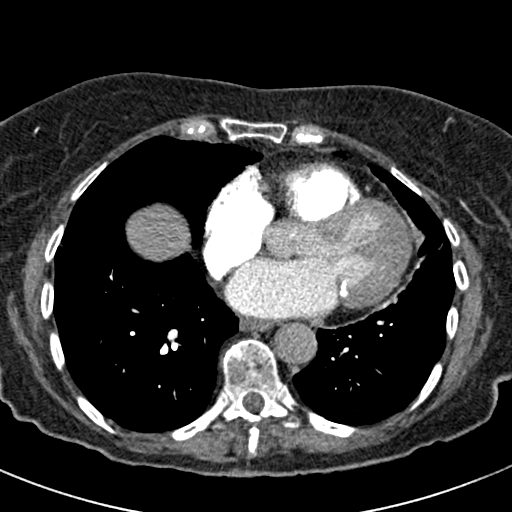
[im 135/311  lung]
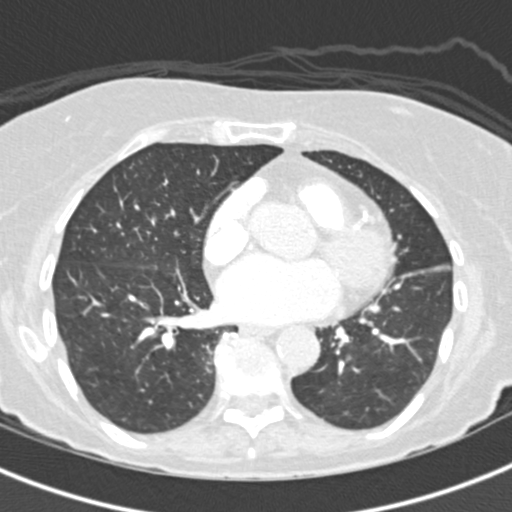
[im 162/311  soft-tissue]
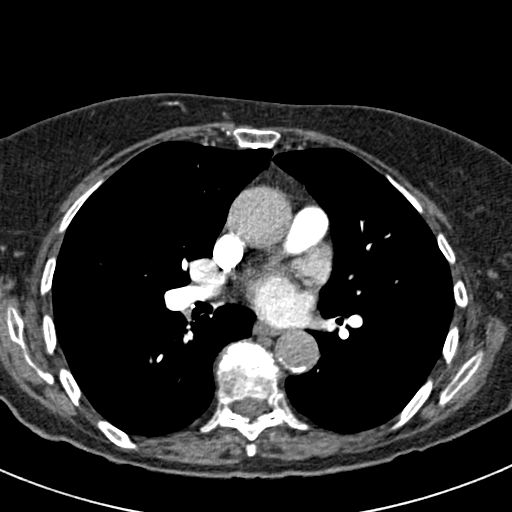
[im 176/311  lung]
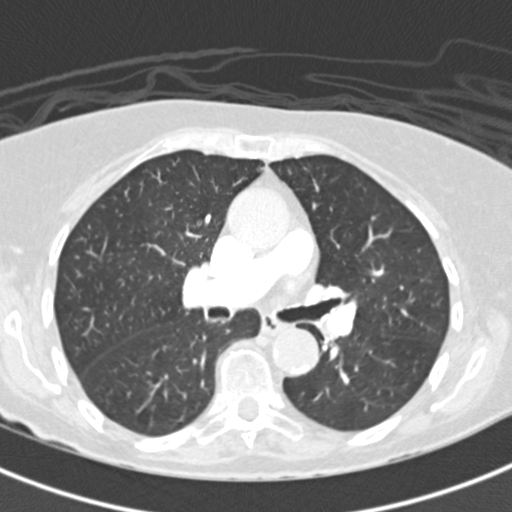
[im 189/311  soft-tissue]
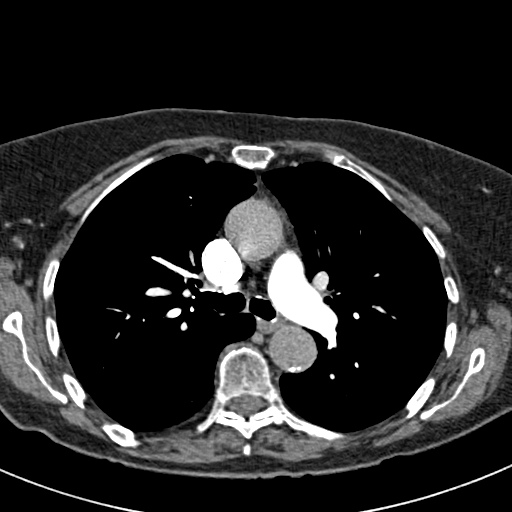
[im 216/311  lung]
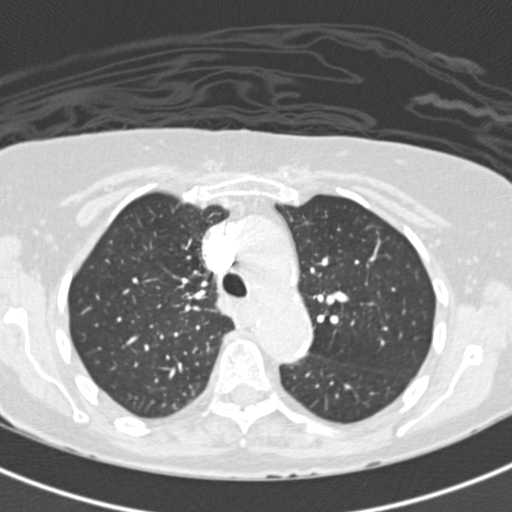
[im 230/311  soft-tissue]
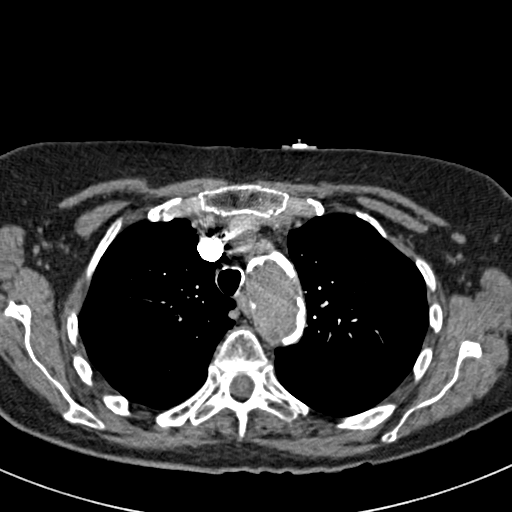
[im 257/311  lung]
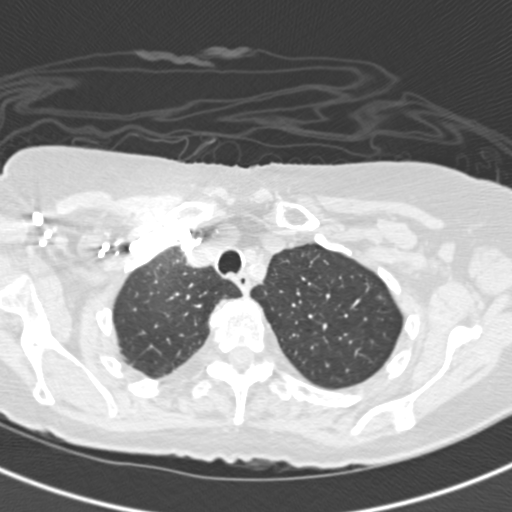
[im 270/311  soft-tissue]
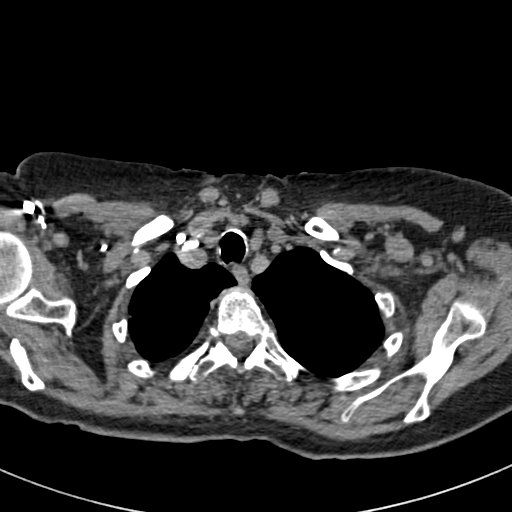
[im 297/311  lung]
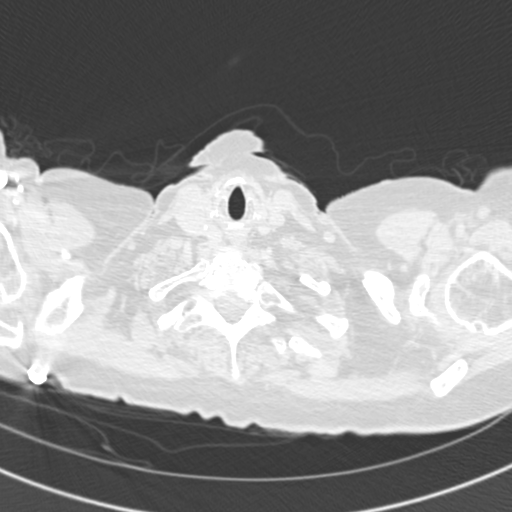

[Series 602: coronal mpr · coronal · 0.61mm/px · 3 of 72 slices shown]
[im 18/72  soft-tissue]
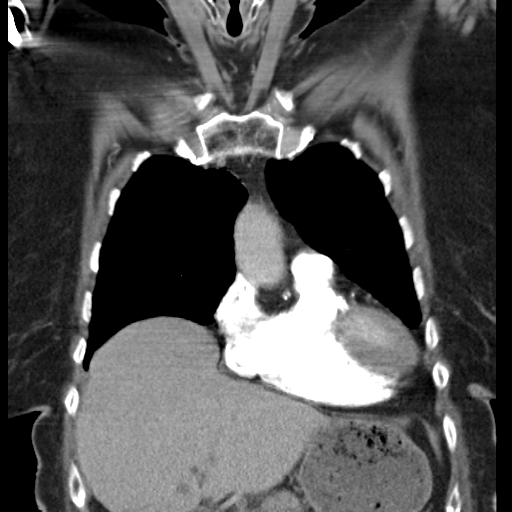
[im 36/72  soft-tissue]
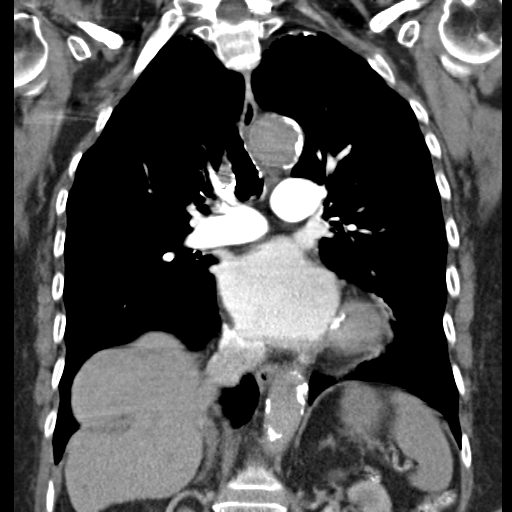
[im 54/72  soft-tissue]
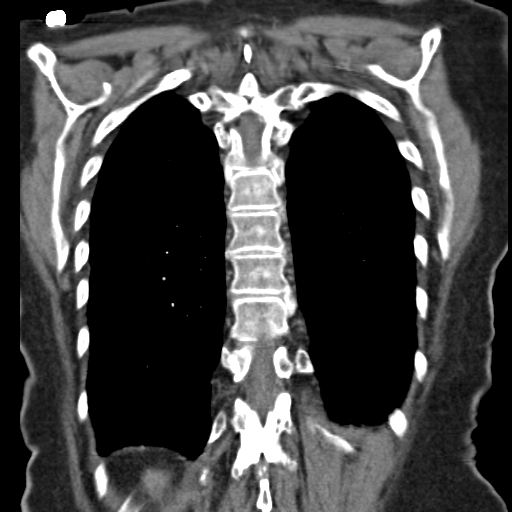

[18 of 46 positions shown; findings below may reference images not displayed]

FINDINGS: Motion artifact and streak from heterogeneous high
density contrast in the SVC slightly obscures immediately adjacent
right anterior upper lobe pulmonary arterial branches, likely
leading to the double contour and heterogeneous appearance observed
in this area.  On some images such as image 132 of series 6 and
image 130 of series 6 as well as image 126 of series 6, chronic
embolus in this vicinity is difficult to exclude, but is a less
likely differential diagnostic consideration given the lack of
other significant pulmonary arterial findings.  No reversal of the
normal interventricular contour or significant reflux of contrast
into the IVC.

Atherosclerotic aortic arch noted.  There is insufficient contrast
in the aorta to assess for entities such as dissection.

Density in the left anterior descending coronary artery noted,
likely from a stent. Calcification along the mitral valve noted.

Faint calcification along apparent scarring in the dome of the
liver noted.  Anterior eventration of the right hemidiaphragm
observed.  Nonspecific 7 mm hypodense lesion of the left kidney
upper pole.  Suspected 2 mm left kidney upper pole nonobstructive
calculus.  1.6 cm cyst of segment one of the liver noted.

Biapical pleuroparenchymal scarring noted.  Suspected small
calcified granuloma noted along the minor fissure.  3 mm calcified
granuloma noted in the left upper lobe on image 14 of series 5.

Thoracic spondylosis noted.
IMPRESSION: 1. Peripheral low density and several right upper lobe pulmonary
arterial branches immediately adjacent to the contrast-filled SVC
noted, most attributable to motion artifact and streak artifact
from the SVC.  Chronic embolus in this location is a less likely
differential diagnostic consideration.  If clinically warranted,
ventilation-perfusion lung scan might help for differentiation.
The lack of evidence of embolic disease aside from this small
segment obviously affected by motion and streak artifact favors
that this appearance is artifactual.
2.  Atherosclerosis.
3.  Anterior eventration of the right hemidiaphragm.
4.  Suspected left nephrolithiasis.
5.  Old granulomatous disease.

## 2013-12-12 ENCOUNTER — Encounter: Payer: Self-pay | Admitting: Internal Medicine

## 2014-03-20 ENCOUNTER — Encounter (HOSPITAL_COMMUNITY): Payer: Self-pay | Admitting: Cardiovascular Disease

## 2014-07-07 ENCOUNTER — Telehealth: Payer: Self-pay | Admitting: Cardiology

## 2014-07-14 NOTE — Telephone Encounter (Signed)
Close encounter 

## 2014-11-11 NOTE — Progress Notes (Signed)
HPI: Fu CAD. She has a h/o severe HTN and hx of PCI to the pLAD in 4/09 with a Palmaz DES and Promus DES to [place] in 11/11. LHC 02/23/12 performed for chest pain and dyspnea: pLAD 20%, mLAD stent patent, dLAD 60%, oD1 70% (small), small CFX with one AV groove OM1 branch with mid diffuse 80%-no change from 2011, oRCA 40%, dRCA stent patent, EF 75% - medical Rx recommended. Last echocardiogram in March 2014 showed normal LV function, mild left ventricular hypertrophy. Since last seen, the patient has dyspnea with more extreme activities but not with routine activities. It is relieved with rest. It is not associated with chest pain. There is no orthopnea, PND or pedal edema. There is no syncope or palpitations. There is no exertional chest pain.   Current Outpatient Prescriptions  Medication Sig Dispense Refill  . ALPRAZolam (XANAX) 0.25 MG tablet Take 1 tablet (0.25 mg total) by mouth 2 (two) times daily as needed for anxiety. 30 tablet 0  . amLODipine (NORVASC) 10 MG tablet     . aspirin 81 MG tablet Take 1 tablet (81 mg total) by mouth daily.    . Calcium Carbonate-Vitamin D (CALCIUM 600+D) 600-400 MG-UNIT per tablet Take 1 tablet by mouth daily.      . enalapril (VASOTEC) 10 MG tablet Take 1 tablet (10 mg total) by mouth daily. 90 tablet 3  . levothyroxine (SYNTHROID, LEVOTHROID) 112 MCG tablet Take 112 mcg by mouth daily before breakfast.    . metoprolol (LOPRESSOR) 100 MG tablet Take 1 tablet (100 mg total) by mouth 2 (two) times daily. 30 tablet 0  . Multiple Vitamin (MULTIVITAMIN) tablet Take 1 tablet by mouth daily.      . nitroGLYCERIN (NITROSTAT) 0.4 MG SL tablet Place 0.4 mg under the tongue every 5 (five) minutes as needed. For chest pain    . simvastatin (ZOCOR) 20 MG tablet Take 20 mg by mouth at bedtime.      . vitamin B-12 (CYANOCOBALAMIN) 500 MCG tablet Take 500 mcg by mouth daily.     No current facility-administered medications for this visit.     Past Medical History    Diagnosis Date  . CAD (coronary artery disease)     a. 07/2007 PCI/Promus DES to LAD;  b. 02/2010 PCI/Promus DES to dRCA.;  c. 12/2010 Lexi MV: EF 88%, no isch/infarct.;  d. 02/2012 Cath: LM nl, LAD 20p, patent mid stent, 60d, D1 sm 70ost, D2 sm, diff, LCX sm , OM1 diff 85m (no change), RCA 40ost, patent stent, PD/PL nl, EF 75% w/o rwma's.  Marland Kitchen HTN (hypertension)   . Hypothyroidism   . Diverticulosis   . IBS (irritable bowel syndrome)   . Carotid bruit   . HLD (hyperlipidemia)   . GERD (gastroesophageal reflux disease)   . Hemorrhoids     Past Surgical History  Procedure Laterality Date  . Back surgery    . Hysterectomy - unknown type    . Rectocele repair    . Coronary angioplasty with stent placement    . Left heart catheterization with coronary angiogram N/A 02/13/2012    Procedure: LEFT HEART CATHETERIZATION WITH CORONARY ANGIOGRAM;  Surgeon: Josue Hector, MD;  Location: [facility] CATH LAB;  Service: Cardiovascular;  Laterality: N/A;    History   Social History  . Marital Status: Widowed    Spouse Name: N/A  . Number of Children: 4  . Years of Education: N/A   Occupational History  . retired  Social History Main Topics  . Smoking status: Never Smoker   . Smokeless tobacco: Not on file  . Alcohol Use: No  . Drug Use: No  . Sexual Activity: Not on file   Other Topics Concern  . Not on file   Social History Narrative   Lives in [place].    ROS: no fevers or chills, productive cough, hemoptysis, dysphasia, odynophagia, melena, hematochezia, dysuria, hematuria, rash, seizure activity, orthopnea, PND, pedal edema, claudication. Remaining systems are negative.  Physical Exam: Well-developed well-nourished in no acute distress.  Skin is warm and dry.  HEENT is normal.  Neck is supple.  Chest is clear to auscultation with normal expansion.  Cardiovascular exam is regular rate and rhythm.  Abdominal exam nontender or distended. No masses palpated. Extremities show no  edema. neuro grossly intact  ECG sinus rhythm with first-degree AV block. No ST changes.

## 2014-11-14 ENCOUNTER — Encounter: Payer: Self-pay | Admitting: Cardiology

## 2014-11-14 ENCOUNTER — Ambulatory Visit (INDEPENDENT_AMBULATORY_CARE_PROVIDER_SITE_OTHER): Payer: PPO | Admitting: Cardiology

## 2014-11-14 VITALS — BP 124/60 | HR 65 | Ht 62.0 in | Wt 135.0 lb

## 2014-11-14 DIAGNOSIS — I10 Essential (primary) hypertension: Secondary | ICD-10-CM

## 2014-11-14 DIAGNOSIS — I2510 Atherosclerotic heart disease of native coronary artery without angina pectoris: Secondary | ICD-10-CM

## 2014-11-14 NOTE — Patient Instructions (Signed)
Your physician wants you to follow-up in: ONE YEAR WITH DR CRENSHAW You will receive a reminder letter in the mail two months in advance. If you don't receive a letter, please call our office to schedule the follow-up appointment.  

## 2014-11-14 NOTE — Assessment & Plan Note (Signed)
Blood pressure controlled. Continue present medications. 

## 2014-11-14 NOTE — Assessment & Plan Note (Signed)
Continue aspirin and statin. 

## 2014-11-14 NOTE — Assessment & Plan Note (Signed)
Continue statin. Lipids and liver monitored by primary care. 

## 2015-05-18 DIAGNOSIS — I10 Essential (primary) hypertension: Secondary | ICD-10-CM | POA: Diagnosis not present

## 2015-05-18 DIAGNOSIS — R300 Dysuria: Secondary | ICD-10-CM | POA: Diagnosis not present

## 2015-05-18 DIAGNOSIS — F5104 Psychophysiologic insomnia: Secondary | ICD-10-CM | POA: Diagnosis not present

## 2015-11-16 DIAGNOSIS — E7800 Pure hypercholesterolemia, unspecified: Secondary | ICD-10-CM | POA: Diagnosis not present

## 2015-11-16 DIAGNOSIS — I10 Essential (primary) hypertension: Secondary | ICD-10-CM | POA: Diagnosis not present

## 2015-11-16 DIAGNOSIS — E559 Vitamin D deficiency, unspecified: Secondary | ICD-10-CM | POA: Diagnosis not present

## 2015-11-16 DIAGNOSIS — E039 Hypothyroidism, unspecified: Secondary | ICD-10-CM | POA: Diagnosis not present

## 2015-11-16 DIAGNOSIS — N390 Urinary tract infection, site not specified: Secondary | ICD-10-CM | POA: Diagnosis not present

## 2015-11-16 DIAGNOSIS — Z0000 Encounter for general adult medical examination without abnormal findings: Secondary | ICD-10-CM | POA: Diagnosis not present

## 2015-11-19 DIAGNOSIS — F419 Anxiety disorder, unspecified: Secondary | ICD-10-CM | POA: Diagnosis not present

## 2015-11-19 DIAGNOSIS — E559 Vitamin D deficiency, unspecified: Secondary | ICD-10-CM | POA: Diagnosis not present

## 2015-11-19 DIAGNOSIS — E039 Hypothyroidism, unspecified: Secondary | ICD-10-CM | POA: Diagnosis not present

## 2015-11-19 DIAGNOSIS — R269 Unspecified abnormalities of gait and mobility: Secondary | ICD-10-CM | POA: Diagnosis not present

## 2015-11-19 DIAGNOSIS — L570 Actinic keratosis: Secondary | ICD-10-CM | POA: Diagnosis not present

## 2015-12-31 DIAGNOSIS — E039 Hypothyroidism, unspecified: Secondary | ICD-10-CM | POA: Diagnosis not present

## 2016-01-01 NOTE — Progress Notes (Signed)
HPI:Fu CAD. She has a h/o severe HTN and hx of PCI to the pLAD in 4/09 with a Palmaz DES and Promus DES to [place] in 11/11. LHC 02/23/12 performed for chest pain and dyspnea: pLAD 20%, mLAD stent patent, dLAD 60%, oD1 70% (small), small CFX with one AV groove OM1 branch with mid diffuse 80%-no change from 2011, oRCA 40%, dRCA stent patent, EF 75% - medical Rx recommended. Last echocardiogram in March 2014 showed normal LV function, mild left ventricular hypertrophy. Since last seen, the patient denies any dyspnea on exertion, orthopnea, PND, pedal edema, palpitations, syncope or chest pain.   Current Outpatient Prescriptions  Medication Sig Dispense Refill  . amLODipine (NORVASC) 10 MG tablet     . aspirin 81 MG tablet Take 1 tablet (81 mg total) by mouth daily.    . Calcium Carbonate-Vitamin D (CALCIUM 600+D) 600-400 MG-UNIT per tablet Take 1 tablet by mouth daily.      . enalapril (VASOTEC) 10 MG tablet Take 1 tablet (10 mg total) by mouth daily. 90 tablet 3  . levothyroxine (SYNTHROID, LEVOTHROID) 100 MCG tablet Take 1 tablet by mouth daily.    . metoprolol (LOPRESSOR) 100 MG tablet Take 1 tablet (100 mg total) by mouth 2 (two) times daily. 30 tablet 0  . Multiple Vitamin (MULTIVITAMIN) tablet Take 1 tablet by mouth daily.      . nitroGLYCERIN (NITROSTAT) 0.4 MG SL tablet Place 0.4 mg under the tongue every 5 (five) minutes as needed. For chest pain    . simvastatin (ZOCOR) 20 MG tablet Take 20 mg by mouth at bedtime.      . traZODone (DESYREL) 50 MG tablet Take 1 tablet by mouth daily.    . vitamin B-12 (CYANOCOBALAMIN) 500 MCG tablet Take 500 mcg by mouth daily.     No current facility-administered medications for this visit.      Past Medical History:  Diagnosis Date  . CAD (coronary artery disease)    a. 07/2007 PCI/Promus DES to LAD;  b. 02/2010 PCI/Promus DES to dRCA.;  c. 12/2010 Lexi MV: EF 88%, no isch/infarct.;  d. 02/2012 Cath: LM nl, LAD 20p, patent mid stent, 60d, D1 sm  70ost, D2 sm, diff, LCX sm , OM1 diff 33m (no change), RCA 40ost, patent stent, PD/PL nl, EF 75% w/o rwma's.  . Carotid bruit   . Diverticulosis   . GERD (gastroesophageal reflux disease)   . Hemorrhoids   . HLD (hyperlipidemia)   . HTN (hypertension)   . Hypothyroidism   . IBS (irritable bowel syndrome)     Past Surgical History:  Procedure Laterality Date  . BACK SURGERY    . CORONARY ANGIOPLASTY WITH STENT PLACEMENT    . hysterectomy - unknown type    . LEFT HEART CATHETERIZATION WITH CORONARY ANGIOGRAM N/A 02/13/2012   Procedure: LEFT HEART CATHETERIZATION WITH CORONARY ANGIOGRAM;  Surgeon: Josue Hector, MD;  Location: [facility] CATH LAB;  Service: Cardiovascular;  Laterality: N/A;  . RECTOCELE REPAIR      Social History   Social History  . Marital status: Widowed    Spouse name: N/A  . Number of children: 4  . Years of education: N/A   Occupational History  . retired    Social History Main Topics  . Smoking status: Never Smoker  . Smokeless tobacco: Not on file  . Alcohol use No  . Drug use: No  . Sexual activity: Not on file   Other Topics Concern  .  Not on file   Social History Narrative   Lives in [place].    Family History  Problem Relation Age of Onset  . Myasthenia gravis Mother   . Hip fracture Father   . Heart disease      several uncles with CAD    ROS: no fevers or chills, productive cough, hemoptysis, dysphasia, odynophagia, melena, hematochezia, dysuria, hematuria, rash, seizure activity, orthopnea, PND, pedal edema, claudication. Remaining systems are negative.  Physical Exam: Well-developed well-nourished in no acute distress.  Skin is warm and dry.  HEENT is normal.  Neck is supple.  Chest is clear to auscultation with normal expansion.  Cardiovascular exam is regular rate and rhythm.  Abdominal exam nontender or distended. No masses palpated. Extremities show no edema. neuro grossly intact   A/P  1 Coronary artery disease-continue  aspirin and statin.  2 hypertension-blood pressure controlled. Continue present medications.  3 hyperlipidemia-continue statin. Lipids and liver monitored by primary care.  Kirk Ruths, MD

## 2016-01-04 ENCOUNTER — Ambulatory Visit (INDEPENDENT_AMBULATORY_CARE_PROVIDER_SITE_OTHER): Payer: PPO | Admitting: Cardiology

## 2016-01-04 ENCOUNTER — Encounter: Payer: Self-pay | Admitting: Cardiology

## 2016-01-04 VITALS — BP 112/62 | HR 58 | Ht 66.0 in | Wt 129.4 lb

## 2016-01-04 DIAGNOSIS — E785 Hyperlipidemia, unspecified: Secondary | ICD-10-CM

## 2016-01-04 DIAGNOSIS — I2510 Atherosclerotic heart disease of native coronary artery without angina pectoris: Secondary | ICD-10-CM | POA: Diagnosis not present

## 2016-01-04 DIAGNOSIS — L570 Actinic keratosis: Secondary | ICD-10-CM | POA: Diagnosis not present

## 2016-01-04 DIAGNOSIS — E039 Hypothyroidism, unspecified: Secondary | ICD-10-CM | POA: Diagnosis not present

## 2016-01-04 DIAGNOSIS — R269 Unspecified abnormalities of gait and mobility: Secondary | ICD-10-CM | POA: Diagnosis not present

## 2016-01-04 DIAGNOSIS — I10 Essential (primary) hypertension: Secondary | ICD-10-CM

## 2016-01-04 NOTE — Patient Instructions (Signed)
Your physician wants you to follow-up in: ONE YEAR WITH DR CRENSHAW You will receive a reminder letter in the mail two months in advance. If you don't receive a letter, please call our office to schedule the follow-up appointment.   If you need a refill on your cardiac medications before your next appointment, please call your pharmacy.  

## 2016-02-25 ENCOUNTER — Ambulatory Visit (INDEPENDENT_AMBULATORY_CARE_PROVIDER_SITE_OTHER): Payer: PPO | Admitting: Nurse Practitioner

## 2016-02-25 ENCOUNTER — Encounter: Payer: Self-pay | Admitting: Nurse Practitioner

## 2016-02-25 ENCOUNTER — Telehealth: Payer: Self-pay | Admitting: Cardiology

## 2016-02-25 VITALS — BP 139/73 | HR 62 | Ht 66.0 in | Wt 127.6 lb

## 2016-02-25 DIAGNOSIS — I10 Essential (primary) hypertension: Secondary | ICD-10-CM

## 2016-02-25 DIAGNOSIS — I25119 Atherosclerotic heart disease of native coronary artery with unspecified angina pectoris: Secondary | ICD-10-CM

## 2016-02-25 DIAGNOSIS — E785 Hyperlipidemia, unspecified: Secondary | ICD-10-CM | POA: Insufficient documentation

## 2016-02-25 DIAGNOSIS — R0600 Dyspnea, unspecified: Secondary | ICD-10-CM

## 2016-02-25 DIAGNOSIS — E782 Mixed hyperlipidemia: Secondary | ICD-10-CM

## 2016-02-25 DIAGNOSIS — R0609 Other forms of dyspnea: Secondary | ICD-10-CM | POA: Diagnosis not present

## 2016-02-25 DIAGNOSIS — I2510 Atherosclerotic heart disease of native coronary artery without angina pectoris: Secondary | ICD-10-CM | POA: Insufficient documentation

## 2016-02-25 DIAGNOSIS — R5383 Other fatigue: Secondary | ICD-10-CM

## 2016-02-25 LAB — CBC
HCT: 42.9 % (ref 35.0–45.0)
Hemoglobin: 14.2 g/dL (ref 11.7–15.5)
MCH: 30.7 pg (ref 27.0–33.0)
MCHC: 33.1 g/dL (ref 32.0–36.0)
MCV: 92.9 fL (ref 80.0–100.0)
MPV: 10.7 fL (ref 7.5–12.5)
Platelets: 221 10*3/uL (ref 140–400)
RBC: 4.62 MIL/uL (ref 3.80–5.10)
RDW: 13.8 % (ref 11.0–15.0)
WBC: 7.6 10*3/uL (ref 3.8–10.8)

## 2016-02-25 LAB — BASIC METABOLIC PANEL
BUN: 14 mg/dL (ref 7–25)
CO2: 29 mmol/L (ref 20–31)
Calcium: 9.8 mg/dL (ref 8.6–10.4)
Chloride: 102 mmol/L (ref 98–110)
Creat: 0.88 mg/dL (ref 0.60–0.88)
Glucose, Bld: 94 mg/dL (ref 65–99)
Potassium: 4.6 mmol/L (ref 3.5–5.3)
Sodium: 138 mmol/L (ref 135–146)

## 2016-02-25 MED ORDER — ISOSORBIDE MONONITRATE ER 30 MG PO TB24: 15.0000 mg | ORAL_TABLET | Freq: Every day | ORAL | 3 refills | Status: DC

## 2016-02-25 NOTE — Progress Notes (Signed)
Office Visit    Patient Name: [name] Date of Encounter: 02/25/2016  Primary Care Provider:  Tommy Medal, MD Primary Cardiologist:  B. Stanford Breed, MD   Chief Complaint    82 year old female with a history of CAD, hypertension, and hyperlipidemia, who presents with a four-day history of dyspnea on exertion and fatigue.  Past Medical History    Past Medical History:  Diagnosis Date  . CAD (coronary artery disease)    a. 07/2007 PCI/Promus DES to LAD;  b. 02/2010 PCI/Promus DES to dRCA.;  c. 12/2010 Lexi MV: EF 88%, no isch/infarct.;  d. 02/2012 Cath: LM nl, LAD 20p, patent mid stent, 60d, D1 sm 70ost, D2 sm, diff, LCX sm , OM1 diff 77m (no change), RCA 40ost, patent stent, PD/PL nl, EF 75% w/o rwma's.  . Carotid bruit   . Diverticulosis   . GERD (gastroesophageal reflux disease)   . Hemorrhoids   . History of echocardiogram    a. 06/2012 Echo: EF nl, mild LVH.  Marland Kitchen HLD (hyperlipidemia)   . HTN (hypertension)   . Hypothyroidism   . IBS (irritable bowel syndrome)    Past Surgical History:  Procedure Laterality Date  . BACK SURGERY    . CORONARY ANGIOPLASTY WITH STENT PLACEMENT    . hysterectomy - unknown type    . LEFT HEART CATHETERIZATION WITH CORONARY ANGIOGRAM N/A 02/13/2012   Procedure: LEFT HEART CATHETERIZATION WITH CORONARY ANGIOGRAM;  Surgeon: Josue Hector, MD;  Location: [facility] CATH LAB;  Service: Cardiovascular;  Laterality: N/A;  . RECTOCELE REPAIR      Allergies  No Known Allergies  History of Present Illness    82 year old female with the above complex past medical history including coronary artery disease status post drug eluting stent placement to the LAD in 2009 with subsequent PCI and stent placement to the distal RCA in 2011. Diagnostic catheterization November 2013 showed patency of both areas with normal LV function. Last echo in March 2014 also showed normal LV function. She was last seen in clinic by Dr. Stanford Breed in September, at which point she was  doing well. She was in her usual state of health until 4 days ago, when she was getting ready for church on Sunday and simply felt fatigued and mildly dyspneic. She has continued to feel fatigued with dyspnea on exertion since then. She notes mild chest pressure with activity though she isn't sure if this is as severe or similar to prior episodes of angina.  Whereas the fatigue has persisted all week, dyspnea on exertion lasts just a few minutes, and resolves spontaneously. She has not been having any fevers, chills, cough, weight gain, PND, orthopnea, dizziness, syncope, edema, or early satiety.  Home Medications    Prior to Admission medications   Medication Sig Start Date End Date Taking? Authorizing Provider  amLODipine (NORVASC) 10 MG tablet  08/19/13  Yes Historical Provider, MD  aspirin 81 MG tablet Take 1 tablet (81 mg total) by mouth daily. 12/03/10  Yes Jolaine Artist, MD  Calcium Carbonate-Vitamin D (CALCIUM 600+D) 600-400 MG-UNIT per tablet Take 1 tablet by mouth daily.     Yes Historical Provider, MD  enalapril (VASOTEC) 10 MG tablet Take 1 tablet (10 mg total) by mouth daily. 07/17/13  Yes Leonard Schwartz, MD  levothyroxine (SYNTHROID, LEVOTHROID) 100 MCG tablet Take 1 tablet by mouth daily. 12/13/15  Yes Historical Provider, MD  metoprolol (LOPRESSOR) 100 MG tablet Take 1 tablet (100 mg total) by mouth 2 (two) times daily.  07/17/13  Yes Leonard Schwartz, MD  Multiple Vitamin (MULTIVITAMIN) tablet Take 1 tablet by mouth daily.     Yes Historical Provider, MD  nitroGLYCERIN (NITROSTAT) 0.4 MG SL tablet Place 0.4 mg under the tongue every 5 (five) minutes as needed. For chest pain   Yes Historical Provider, MD  simvastatin (ZOCOR) 20 MG tablet Take 20 mg by mouth at bedtime.   Yes Historical Provider, MD  traZODone (DESYREL) 50 MG tablet Take 1 tablet by mouth daily. 12/15/15  Yes Historical Provider, MD  vitamin B-12 (CYANOCOBALAMIN) 500 MCG tablet Take 500 mcg by mouth daily.   Yes Historical  Provider, MD    Review of Systems    Fatigue and dyspnea on exertion over the past 4 days. She notes occasional, mild exertional chest discomfort though she isn't sure if this is similar to prior angina. She denies fever, chills, cough, congestion, palpitations, PND, orthopnea, dizziness, syncope, edema, weight gain, or early satiety.  All other systems reviewed and are otherwise negative except as noted above.  Physical Exam    VS:  BP 139/73   Pulse 62   Ht 5\' 6"  (1.676 m)   Wt 127 lb 9.6 oz (57.9 kg)   SpO2 98%   BMI 20.60 kg/m  , BMI Body mass index is 20.6 kg/m. GEN: Well nourished, well developed, in no acute distress.  HEENT: normal.  Neck: Supple, no JVD, carotid bruits, or masses. Cardiac: RRR, no murmurs, rubs, or gallops. No clubbing, cyanosis, edema.  Radials/DP/PT 2+ and equal bilaterally.  Respiratory:  Respirations regular and unlabored, clear to auscultation bilaterally. GI: Soft, nontender, nondistended, BS + x 4. MS: no deformity or atrophy. Skin: warm and dry, no rash. Neuro:  Strength and sensation are intact. Psych: Normal affect.  Accessory Clinical Findings    ECG - Regular sinus rhythm, 62, first-degree AV block, no acute ST or T changes.  Assessment & Plan    1.  Coronary artery disease with angina/dyspnea on exertion: Patient resides with a four-day history of persistent fatigue and also exertional dyspnea. She says she has had very mild exertional chest discomfort as well though it is really the dyspnea that concerns her. She has not been having any fevers, chills, cough, weight gain, or edema. She is euvolemic on exam. Heart rate and blood pressure stable. She does not remember what her prior anginal equivalent was though she thinks it involve more chest discomfort than what she is currently experiencing. ECG today is nonacute. We discussed options for evaluation and have collectively decided to check labs including a CBC, basic metabolic panel, and BNP  today. I will also arrange for a Lexiscan Myoview to take place early next week and I can see her back in clinic shortly thereafter to discuss results and determine if further testing is required. She was agreeable with this plan. I will also add isosorbide mononitrate 15 mg daily to see if she has any relief with symptoms on this.  2. Essential hypertension: Blood pressure is only mildly elevated today. She doesn't know what it typically runs. I am adding isosorbide mononitrate in the setting of possible unstable angina.  3. Hyperlipidemia: She remains on statin therapy. Lipids and LFTs are monitored by primary care.  4. Disposition: Follow-up stress testing early next week and I will see her back in clinic shortly thereafter.  Murray Hodgkins, NP 02/25/2016, 11:29 AM

## 2016-02-25 NOTE — Patient Instructions (Signed)
Medication Instructions: Ignacia Bayley, NP has recommended making the following medication changes: 1. START Isosorbide - take 0.5 tablet (15 mg total) by mouth once daily  Labwork: Your physician recommends that you return for lab work TODAY.  Testing/Procedures: 1. [place] Stress test ASAP - Your physician has requested that you have a lexiscan myoview. For further information please visit [email or web address]. Please follow instruction sheet, as given.  Follow-up: Your physician recommends that you schedule a follow-up appointment after the stress test with Dr Stanford Breed or Ignacia Bayley, NP.  If you need a refill on your cardiac medications before your next appointment, please call your pharmacy.

## 2016-02-25 NOTE — Telephone Encounter (Signed)
New message      Pt c/o Shortness Of Breath: STAT if SOB developed within the last 24 hours or pt is noticeably SOB on the phone  1. Are you currently SOB (can you hear that pt is SOB on the phone)? no 2. How long have you been experiencing SOB? Since sunday 3. Are you SOB when sitting or when up moving around?  both  4. Are you currently experiencing any other symptoms?  Pt states that she is "weak" and has had 2 stents in the past. Please advise

## 2016-02-25 NOTE — Telephone Encounter (Signed)
Pt of Dr. Stanford Breed. Last seen 9/25 for routine OV.  I spoke w this patient. She's been having fatigue, c/o feeling poorly for approx 1 week.  No obvious URI symptoms. She's c/o SOB on exertion.  She requested evaluation in clinic by provider.  Patient stated she could come in at any time. She was agreeable to evaluation by APP.  I have added to Gerald Stabs' schedule today & confirmed appt info w patient.

## 2016-02-26 LAB — BRAIN NATRIURETIC PEPTIDE: Brain Natriuretic Peptide: 109.6 pg/mL — ABNORMAL HIGH (ref <100)

## 2016-03-01 ENCOUNTER — Ambulatory Visit (HOSPITAL_COMMUNITY)
Admission: RE | Admit: 2016-03-01 | Discharge: 2016-03-01 | Disposition: A | Discharge status: Home / Self Care | Payer: PPO | Source: Ambulatory Visit | Attending: Cardiovascular Disease | Admitting: Cardiovascular Disease

## 2016-03-01 DIAGNOSIS — Z9861 Coronary angioplasty status: Secondary | ICD-10-CM | POA: Insufficient documentation

## 2016-03-01 DIAGNOSIS — R0600 Dyspnea, unspecified: Secondary | ICD-10-CM

## 2016-03-01 DIAGNOSIS — E039 Hypothyroidism, unspecified: Secondary | ICD-10-CM | POA: Diagnosis not present

## 2016-03-01 DIAGNOSIS — Z86718 Personal history of other venous thrombosis and embolism: Secondary | ICD-10-CM | POA: Insufficient documentation

## 2016-03-01 DIAGNOSIS — I10 Essential (primary) hypertension: Secondary | ICD-10-CM | POA: Diagnosis not present

## 2016-03-01 DIAGNOSIS — R0609 Other forms of dyspnea: Secondary | ICD-10-CM | POA: Diagnosis not present

## 2016-03-01 DIAGNOSIS — I25119 Atherosclerotic heart disease of native coronary artery with unspecified angina pectoris: Secondary | ICD-10-CM | POA: Diagnosis not present

## 2016-03-01 LAB — MYOCARDIAL PERFUSION IMAGING
Estimated workload: 1 METS
LV dias vol: 56 mL (ref 46–106)
LV sys vol: 10 mL
Peak BP: 136 mmHg
Peak HR: 83 {beats}/min
Percent of predicted max HR: 65 %
Rest HR: 60 {beats}/min
SDS: 2
SRS: 5
SSS: 7
Stage 1 DBP: 80 mmHg
Stage 1 Grade: 0 %
Stage 1 HR: 60 {beats}/min
Stage 1 SBP: 153 mmHg
Stage 1 Speed: 0 mph
Stage 2 Grade: 0 %
Stage 2 HR: 61 {beats}/min
Stage 2 Speed: 0 mph
Stage 3 DBP: 69 mmHg
Stage 3 Grade: 0 %
Stage 3 HR: 83 {beats}/min
Stage 3 SBP: 136 mmHg
Stage 3 Speed: 0 mph
Stage 4 DBP: 66 mmHg
Stage 4 Grade: 0 %
Stage 4 HR: 67 {beats}/min
Stage 4 SBP: 136 mmHg
Stage 4 Speed: 0 mph
TID: 1.03

## 2016-03-01 MED ORDER — REGADENOSON 0.4 MG/5ML IV SOLN
0.4000 mg | Freq: Once | INTRAVENOUS | Status: AC
  Administered 2016-03-01: 0.4 mg via INTRAVENOUS

## 2016-03-01 MED ORDER — TECHNETIUM TC 99M TETROFOSMIN IV KIT
31.2000 | PACK | Freq: Once | INTRAVENOUS | Status: AC | PRN
  Administered 2016-03-01: 31.2 via INTRAVENOUS
  Filled 2016-03-01: qty 32

## 2016-03-01 MED ORDER — TECHNETIUM TC 99M TETROFOSMIN IV KIT
10.6000 | PACK | Freq: Once | INTRAVENOUS | Status: AC | PRN
  Administered 2016-03-01: 10.6 via INTRAVENOUS
  Filled 2016-03-01: qty 11

## 2016-03-01 MED ORDER — AMINOPHYLLINE 25 MG/ML IV SOLN
75.0000 mg | Freq: Once | INTRAVENOUS | Status: AC
  Administered 2016-03-01: 75 mg via INTRAVENOUS

## 2016-03-02 ENCOUNTER — Telehealth: Payer: Self-pay | Admitting: *Deleted

## 2016-03-02 DIAGNOSIS — R5383 Other fatigue: Secondary | ICD-10-CM

## 2016-03-02 DIAGNOSIS — R0600 Dyspnea, unspecified: Secondary | ICD-10-CM

## 2016-03-02 NOTE — Telephone Encounter (Signed)
I followed up with this patient today. Note I'd spoken with her yesterday regarding her results, and at the time she felt like her symptoms were getting better. For this reason, she wished to have a few days to think about pursuing further testing.  We had a long discussion today, and she noted that her fatigue, while better at times, doesn't seem to be improving as much as she thought initially. She is also still getting short of breath with exertion. She was dismissive of this as "old age" but agreeable to my suggestion for further testing in advance of her appt w/ Dr. Stanford Breed. I explained that I would submit Gerald Stabs' orders, and follow up with her on Monday. She's aware that for new concerns, she may contact pager service this weekend, or go to ED if urgent symptoms. Pt voiced understanding and thanks.

## 2016-03-02 NOTE — Telephone Encounter (Signed)
-----   Message from Rogelia Mire, NP sent at 03/01/2016  5:09 PM EST ----- Stress testing is normal, which is reassuring.  If she remains dyspneic, please arrange for echo, pa and lat cxr, and d dimer.  She is scheduled to see Dr. Stanford Breed on 12/7, thus it would be ideal that these are performed prior to that visit.

## 2016-03-04 ENCOUNTER — Other Ambulatory Visit: Payer: Self-pay | Admitting: Nurse Practitioner

## 2016-03-04 DIAGNOSIS — R0609 Other forms of dyspnea: Principal | ICD-10-CM

## 2016-03-04 DIAGNOSIS — R0600 Dyspnea, unspecified: Secondary | ICD-10-CM

## 2016-03-04 NOTE — Telephone Encounter (Signed)
Thanks Ovid Curd.  I've entered order - it comes up under cxr - 2 view.

## 2016-03-10 ENCOUNTER — Inpatient Hospital Stay (HOSPITAL_COMMUNITY): Admission: RE | Admit: 2016-03-10 | Payer: PPO | Source: Ambulatory Visit

## 2016-03-14 NOTE — Progress Notes (Signed)
HPI: Fu CAD. She has a h/o severe HTN and hx of PCI to the pLAD in 4/09 with a Palmaz DES and Promus DES to [place] in 11/11. LHC 02/23/12 performed for chest pain and dyspnea: pLAD 20%, mLAD stent patent, dLAD 60%, oD1 70% (small), small CFX with one AV groove OM1 branch with mid diffuse 80%-no change from 2011, oRCA 40%, dRCA stent patent, EF 75% - medical Rx recommended. Last echocardiogram in March 2014 showed normal LV function, mild left ventricular hypertrophy. Nuclear study November 2017 showed ejection fraction 82% with no ischemia or infarction. Patient seen recently for dyspnea on exertion. BNP 109. Hemoglobin normal and creatinine 0.88. Since last seen, she notes dyspnea on exertion but no orthopnea, PND, pedal edema, chest pain or syncope.  Current Outpatient Prescriptions  Medication Sig Dispense Refill  . amLODipine (NORVASC) 10 MG tablet     . aspirin 81 MG tablet Take 1 tablet (81 mg total) by mouth daily.    . Calcium Carbonate-Vitamin D (CALCIUM 600+D) 600-400 MG-UNIT per tablet Take 1 tablet by mouth daily.      . enalapril (VASOTEC) 10 MG tablet Take 1 tablet (10 mg total) by mouth daily. 90 tablet 3  . isosorbide mononitrate (IMDUR) 30 MG 24 hr tablet Take 0.5 tablets (15 mg total) by mouth daily. 45 tablet 3  . levothyroxine (SYNTHROID, LEVOTHROID) 100 MCG tablet Take 1 tablet by mouth daily.    . metoprolol (LOPRESSOR) 100 MG tablet Take 1 tablet (100 mg total) by mouth 2 (two) times daily. 30 tablet 0  . Multiple Vitamin (MULTIVITAMIN) tablet Take 1 tablet by mouth daily.      . nitroGLYCERIN (NITROSTAT) 0.4 MG SL tablet Place 0.4 mg under the tongue every 5 (five) minutes as needed. For chest pain    . simvastatin (ZOCOR) 20 MG tablet Take 20 mg by mouth at bedtime.    . traZODone (DESYREL) 50 MG tablet Take 1 tablet by mouth daily.    . vitamin B-12 (CYANOCOBALAMIN) 500 MCG tablet Take 500 mcg by mouth daily.     No current facility-administered medications for this  visit.      Past Medical History:  Diagnosis Date  . CAD (coronary artery disease)    a. 07/2007 PCI/Promus DES to LAD;  b. 02/2010 PCI/Promus DES to dRCA.;  c. 12/2010 Lexi MV: EF 88%, no isch/infarct.;  d. 02/2012 Cath: LM nl, LAD 20p, patent mid stent, 60d, D1 sm 70ost, D2 sm, diff, LCX sm , OM1 diff 22m (no change), RCA 40ost, patent stent, PD/PL nl, EF 75% w/o rwma's.  . Carotid bruit   . Diverticulosis   . GERD (gastroesophageal reflux disease)   . Hemorrhoids   . History of echocardiogram    a. 06/2012 Echo: EF nl, mild LVH.  Marland Kitchen HLD (hyperlipidemia)   . HTN (hypertension)   . Hypothyroidism   . IBS (irritable bowel syndrome)     Past Surgical History:  Procedure Laterality Date  . BACK SURGERY    . CORONARY ANGIOPLASTY WITH STENT PLACEMENT    . hysterectomy - unknown type    . LEFT HEART CATHETERIZATION WITH CORONARY ANGIOGRAM N/A 02/13/2012   Procedure: LEFT HEART CATHETERIZATION WITH CORONARY ANGIOGRAM;  Surgeon: Josue Hector, MD;  Location[facility] CATH LAB;  Service: Cardiovascular;  Laterality: N/A;  . RECTOCELE REPAIR      Social History   Social History  . Marital status: Widowed    Spouse name: N/A  .  Number of children: 4  . Years of education: N/A   Occupational History  . retired    Social History Main Topics  . Smoking status: Never Smoker  . Smokeless tobacco: Never Used  . Alcohol use No  . Drug use: No  . Sexual activity: Not on file   Other Topics Concern  . Not on file   Social History Narrative   Lives in [place].    Family History  Problem Relation Age of Onset  . Myasthenia gravis Mother   . Hip fracture Father   . Heart disease      several uncles with CAD    ROS: no fevers or chills, productive cough, hemoptysis, dysphasia, odynophagia, melena, hematochezia, dysuria, hematuria, rash, seizure activity, orthopnea, PND, pedal edema, claudication. Remaining systems are negative.  Physical Exam: Well-developed well-nourished in no acute  distress.  Skin is warm and dry.  HEENT is normal.  Neck is supple.  Chest is clear to auscultation with normal expansion.  Cardiovascular exam is regular rate and rhythm.  Abdominal exam nontender or distended. No masses palpated. Extremities show no edema. neuro grossly intact  ECG-sinus rhythm at a rate of 52. First degree AV block. Cannot rule out prior septal infarct.  A/P  1 Coronary artery disease-continue aspirin and statin.  2 hypertension-blood pressure controlled. Continue present medications.  3 hyperlipidemia-continue statin.  4 dyspnea-etiology unclear. Nuclear study shows no ischemia. We will await results of echocardiogram scheduled December 20. Check chest x-ray and d-dimer. If above evaluation negative we will consider cardiac catheterization but given age would like to be conservative if possible.  Kirk Ruths, MD

## 2016-03-17 ENCOUNTER — Ambulatory Visit (INDEPENDENT_AMBULATORY_CARE_PROVIDER_SITE_OTHER): Payer: PPO | Admitting: Cardiology

## 2016-03-17 ENCOUNTER — Ambulatory Visit
Admission: RE | Admit: 2016-03-17 | Discharge: 2016-03-17 | Disposition: A | Discharge status: Home / Self Care | Payer: PPO | Source: Ambulatory Visit | Attending: Nurse Practitioner | Admitting: Nurse Practitioner

## 2016-03-17 ENCOUNTER — Encounter: Payer: Self-pay | Admitting: Cardiology

## 2016-03-17 VITALS — BP 118/52 | HR 52 | Ht 66.0 in | Wt 127.0 lb

## 2016-03-17 DIAGNOSIS — I10 Essential (primary) hypertension: Secondary | ICD-10-CM | POA: Diagnosis not present

## 2016-03-17 DIAGNOSIS — R0602 Shortness of breath: Secondary | ICD-10-CM | POA: Diagnosis not present

## 2016-03-17 DIAGNOSIS — R0600 Dyspnea, unspecified: Secondary | ICD-10-CM

## 2016-03-17 DIAGNOSIS — I25119 Atherosclerotic heart disease of native coronary artery with unspecified angina pectoris: Secondary | ICD-10-CM

## 2016-03-17 DIAGNOSIS — E7800 Pure hypercholesterolemia, unspecified: Secondary | ICD-10-CM

## 2016-03-17 DIAGNOSIS — R0609 Other forms of dyspnea: Principal | ICD-10-CM

## 2016-03-17 NOTE — Patient Instructions (Signed)
Medication Instructions:   NO CHANGE  Labwork:  Your physician recommends that you HAVE LAB WORK TODAY  Testing/Procedures:  A chest x-ray takes a picture of the organs and structures inside the chest, including the heart, lungs, and blood vessels. This test can show several things, including, whether the heart is enlarges; whether fluid is building up in the lungs; and whether pacemaker / defibrillator leads are still in place.   Follow-Up:  Your physician recommends that you schedule a follow-up appointment in: [place]

## 2016-03-18 ENCOUNTER — Telehealth: Payer: Self-pay | Admitting: *Deleted

## 2016-03-18 DIAGNOSIS — R7989 Other specified abnormal findings of blood chemistry: Secondary | ICD-10-CM

## 2016-03-18 LAB — D-DIMER, QUANTITATIVE: D-Dimer, Quant: 0.92 mcg/mL FEU — ABNORMAL HIGH (ref <0.50)

## 2016-03-18 NOTE — Telephone Encounter (Signed)
VQ scan scheduled for 03-21-16 @ 1 pm at [facility]. .puv of appt time and location

## 2016-03-18 NOTE — Telephone Encounter (Signed)
pt aware of results. Left message for radiology scheduling to call me.

## 2016-03-18 NOTE — Telephone Encounter (Signed)
-----   Message from Lelon Perla, MD sent at 03/18/2016  7:37 AM EST ----- VQ scan Kirk Ruths

## 2016-03-21 ENCOUNTER — Other Ambulatory Visit: Payer: Self-pay | Admitting: *Deleted

## 2016-03-21 ENCOUNTER — Ambulatory Visit (HOSPITAL_COMMUNITY)
Admission: RE | Admit: 2016-03-21 | Discharge: 2016-03-21 | Disposition: A | Discharge status: Home / Self Care | Payer: PPO | Source: Ambulatory Visit | Attending: Cardiology | Admitting: Cardiology

## 2016-03-21 DIAGNOSIS — R7989 Other specified abnormal findings of blood chemistry: Secondary | ICD-10-CM | POA: Diagnosis not present

## 2016-03-21 DIAGNOSIS — I700 Atherosclerosis of aorta: Secondary | ICD-10-CM | POA: Diagnosis not present

## 2016-03-21 DIAGNOSIS — R079 Chest pain, unspecified: Secondary | ICD-10-CM | POA: Diagnosis not present

## 2016-03-21 DIAGNOSIS — R0602 Shortness of breath: Secondary | ICD-10-CM | POA: Diagnosis not present

## 2016-03-21 DIAGNOSIS — J449 Chronic obstructive pulmonary disease, unspecified: Secondary | ICD-10-CM | POA: Insufficient documentation

## 2016-03-21 MED ORDER — TECHNETIUM TC 99M DIETHYLENETRIAME-PENTAACETIC ACID: 29.4000 | Freq: Once | INTRAVENOUS | Status: DC | PRN

## 2016-03-30 ENCOUNTER — Ambulatory Visit (HOSPITAL_COMMUNITY): Payer: PPO | Attending: Internal Medicine

## 2016-03-30 ENCOUNTER — Other Ambulatory Visit: Payer: Self-pay

## 2016-03-30 DIAGNOSIS — R5383 Other fatigue: Secondary | ICD-10-CM | POA: Diagnosis not present

## 2016-03-30 DIAGNOSIS — E785 Hyperlipidemia, unspecified: Secondary | ICD-10-CM | POA: Insufficient documentation

## 2016-03-30 DIAGNOSIS — R0600 Dyspnea, unspecified: Secondary | ICD-10-CM | POA: Diagnosis not present

## 2016-03-30 DIAGNOSIS — I2510 Atherosclerotic heart disease of native coronary artery without angina pectoris: Secondary | ICD-10-CM | POA: Diagnosis not present

## 2016-03-30 DIAGNOSIS — I10 Essential (primary) hypertension: Secondary | ICD-10-CM | POA: Diagnosis not present

## 2016-03-30 DIAGNOSIS — I071 Rheumatic tricuspid insufficiency: Secondary | ICD-10-CM | POA: Insufficient documentation

## 2016-03-31 ENCOUNTER — Telehealth: Payer: Self-pay | Admitting: Nurse Practitioner

## 2016-03-31 DIAGNOSIS — Z79899 Other long term (current) drug therapy: Secondary | ICD-10-CM

## 2016-03-31 MED ORDER — FUROSEMIDE 20 MG PO TABS: 20.0000 mg | ORAL_TABLET | Freq: Every day | ORAL | 5 refills | Status: DC

## 2016-03-31 NOTE — Telephone Encounter (Signed)
Patient called with echo results. Agrees to lasix 20mg  QD and labs next week. Rx(s) sent to pharmacy electronically. BMET ordered, patient is aware to have this done end of next week.

## 2016-04-08 DIAGNOSIS — Z79899 Other long term (current) drug therapy: Secondary | ICD-10-CM | POA: Diagnosis not present

## 2016-04-09 LAB — BASIC METABOLIC PANEL
BUN: 17 mg/dL (ref 7–25)
CO2: 32 mmol/L — ABNORMAL HIGH (ref 20–31)
Calcium: 9.5 mg/dL (ref 8.6–10.4)
Chloride: 99 mmol/L (ref 98–110)
Creat: 1.12 mg/dL — ABNORMAL HIGH (ref 0.60–0.88)
Glucose, Bld: 85 mg/dL (ref 65–99)
Potassium: 4.5 mmol/L (ref 3.5–5.3)
Sodium: 138 mmol/L (ref 135–146)

## 2016-04-13 ENCOUNTER — Telehealth: Payer: Self-pay | Admitting: *Deleted

## 2016-04-13 ENCOUNTER — Other Ambulatory Visit: Payer: Self-pay | Admitting: *Deleted

## 2016-04-13 DIAGNOSIS — Z79899 Other long term (current) drug therapy: Secondary | ICD-10-CM

## 2016-04-13 NOTE — Progress Notes (Unsigned)
See telephone note.

## 2016-04-13 NOTE — Telephone Encounter (Signed)
Notes Recorded by Rogelia Mire, NP on 04/09/2016 at 7:18 AM EST Bun, creat, and bicarb are up very slightly. Please arrange for f/u bmet in 1 wk to ensure stability.  --------------------------------------------  Spoke w patient regarding f/u BMET from initiation of lasix based on echo findings.   She noted understanding of labs, but will not be able to get BMET this week due to transportation. Advised that since she is coming on 1/11 should be okay to get done that day. She's agreeable to this and states she won't be able to do this sooner as she has to prearrange transportation w her sons, who all work.  She also notes she's having an increase of accidents (urinary). We discussed at length, she believes this has been since starting the lasix. She is just unable to reach bathroom quickly enough once she feels urinary urgency. I noted might be feasible to either cut or split the dose (she's currently taking 20mg  daily in AM following breakfast).  Will seek review from [place].

## 2016-04-13 NOTE — Telephone Encounter (Signed)
I spoke w patient and communicated recommendations.  She voiced thanks and understanding. Will cut dose to 10mg  daily and follow.  Aware to call if new concerns.

## 2016-04-13 NOTE — Telephone Encounter (Signed)
It would be fine to 1/2 the dose to 10 mg.  I'm not sure that I would have her do that BID, b/c then we may just be setting her up to continue to have incontinence into the evening hours.

## 2016-04-15 NOTE — Progress Notes (Signed)
HPI: Fu CAD. She has a h/o severe HTN and hx of PCI to the pLAD in 4/09 with a Palmaz DES and Promus DES to [place] in 11/11. LHC 02/23/12 performed for chest pain and dyspnea: pLAD 20%, mLAD stent patent, dLAD 60%, oD1 70% (small), small CFX with one AV groove OM1 branch with mid diffuse 80%-no change from 2011, oRCA 40%, dRCA stent patent, EF 75% - medical Rx recommended. Nuclear study November 2017 showed ejection fraction 82% with no ischemia or infarction. Patient seen recently for dyspnea on exertion. BNP 109. Hemoglobin normal and creatinine 0.88. VQ scan December 2017 lower probability. Echocardiogram December 2017 showed normal LV function, grade 1 diastolic dysfunction, elevated left ventricular filling pressures and mild tricuspid regurgitation. Lasix 20 mg daily added, but reduced to 10 mg daily as she was having incontinence with higher dose. Since last seen, she continues to describe weakness and dyspnea on exertion. No orthopnea, PND, pedal edema, chest pain or syncope.  Current Outpatient Prescriptions  Medication Sig Dispense Refill  . amLODipine (NORVASC) 10 MG tablet     . aspirin 81 MG tablet Take 1 tablet (81 mg total) by mouth daily.    . Calcium Carbonate-Vitamin D (CALCIUM 600+D) 600-400 MG-UNIT per tablet Take 1 tablet by mouth daily.      . enalapril (VASOTEC) 10 MG tablet Take 1 tablet (10 mg total) by mouth daily. 90 tablet 3  . furosemide (LASIX) 20 MG tablet Take 1 tablet (20 mg total) by mouth daily. 30 tablet 5  . isosorbide mononitrate (IMDUR) 30 MG 24 hr tablet Take 0.5 tablets (15 mg total) by mouth daily. 45 tablet 3  . levothyroxine (SYNTHROID, LEVOTHROID) 100 MCG tablet Take 1 tablet by mouth daily.    . metoprolol (LOPRESSOR) 100 MG tablet Take 1 tablet (100 mg total) by mouth 2 (two) times daily. 30 tablet 0  . Multiple Vitamin (MULTIVITAMIN) tablet Take 1 tablet by mouth daily.      . nitroGLYCERIN (NITROSTAT) 0.4 MG SL tablet Place 0.4 mg under the tongue  every 5 (five) minutes as needed. For chest pain    . simvastatin (ZOCOR) 20 MG tablet Take 20 mg by mouth at bedtime.    . traZODone (DESYREL) 50 MG tablet Take 1 tablet by mouth daily.    . vitamin B-12 (CYANOCOBALAMIN) 500 MCG tablet Take 500 mcg by mouth daily.     No current facility-administered medications for this visit.      Past Medical History:  Diagnosis Date  . CAD (coronary artery disease)    a. 07/2007 PCI/Promus DES to LAD;  b. 02/2010 PCI/Promus DES to dRCA.;  c. 12/2010 Lexi MV: EF 88%, no isch/infarct.;  d. 02/2012 Cath: LM nl, LAD 20p, patent mid stent, 60d, D1 sm 70ost, D2 sm, diff, LCX sm , OM1 diff 57m (no change), RCA 40ost, patent stent, PD/PL nl, EF 75% w/o rwma's.  . Carotid bruit   . Diverticulosis   . GERD (gastroesophageal reflux disease)   . Hemorrhoids   . History of echocardiogram    a. 06/2012 Echo: EF nl, mild LVH.  Marland Kitchen HLD (hyperlipidemia)   . HTN (hypertension)   . Hypothyroidism   . IBS (irritable bowel syndrome)     Past Surgical History:  Procedure Laterality Date  . BACK SURGERY    . CORONARY ANGIOPLASTY WITH STENT PLACEMENT    . hysterectomy - unknown type    . LEFT HEART CATHETERIZATION WITH CORONARY ANGIOGRAM N/A 02/13/2012  Procedure: LEFT HEART CATHETERIZATION WITH CORONARY ANGIOGRAM;  Surgeon: Josue Hector, MD;  Location: [facility] CATH LAB;  Service: Cardiovascular;  Laterality: N/A;  . RECTOCELE REPAIR      Social History   Social History  . Marital status: Widowed    Spouse name: N/A  . Number of children: 4  . Years of education: N/A   Occupational History  . retired    Social History Main Topics  . Smoking status: Never Smoker  . Smokeless tobacco: Never Used  . Alcohol use No  . Drug use: No  . Sexual activity: Not on file   Other Topics Concern  . Not on file   Social History Narrative   Lives in [place].    Family History  Problem Relation Age of Onset  . Myasthenia gravis Mother   . Hip fracture Father   . Heart  disease      several uncles with CAD    ROS: Fatigued but no fevers or chills, productive cough, hemoptysis, dysphasia, odynophagia, melena, hematochezia, dysuria, hematuria, rash, seizure activity, orthopnea, PND, pedal edema, claudication. Remaining systems are negative.  Physical Exam: Well-developed well-nourished in no acute distress.  Skin is warm and dry.  HEENT is normal.  Neck is supple.  Chest is clear to auscultation with normal expansion.  Cardiovascular exam is regular rate and rhythm.  Abdominal exam nontender or distended. No masses palpated. Extremities show no edema. neuro grossly intact  A/P  1 dyspnea-etiology remains unclear. Chest xray with COPD. VQ negative. Hgb normal. BNP 109. Nuclear study showed no ischemia. Echocardiogram showed vigorous LV function, mild left ventricular hypertrophy, grade 1 diastolic dysfunction with elevated filling pressures. Lasix was added with no improvement. Would like to be conservative given patient's age. Question if mildly reduced heart rate and blood pressure contributing to fatigue. Discontinue isosorbide. Decrease metoprolol to 50 mg twice a day. Follow blood pressure and adjust as needed.   2 coronary artery disease-continue aspirin and statin.  3 hypertension-blood pressure controlled. Continue present medications with changes as outlined above.  4 hyperlipidemia-continue statin.   Kirk Ruths, MD

## 2016-04-21 ENCOUNTER — Encounter: Payer: Self-pay | Admitting: Cardiology

## 2016-04-21 ENCOUNTER — Ambulatory Visit (INDEPENDENT_AMBULATORY_CARE_PROVIDER_SITE_OTHER): Payer: PPO | Admitting: Cardiology

## 2016-04-21 VITALS — BP 106/58 | HR 64 | Ht 65.0 in | Wt 127.0 lb

## 2016-04-21 DIAGNOSIS — I2510 Atherosclerotic heart disease of native coronary artery without angina pectoris: Secondary | ICD-10-CM

## 2016-04-21 DIAGNOSIS — R0609 Other forms of dyspnea: Secondary | ICD-10-CM | POA: Diagnosis not present

## 2016-04-21 DIAGNOSIS — I10 Essential (primary) hypertension: Secondary | ICD-10-CM

## 2016-04-21 DIAGNOSIS — E7800 Pure hypercholesterolemia, unspecified: Secondary | ICD-10-CM

## 2016-04-21 LAB — BASIC METABOLIC PANEL
BUN: 30 mg/dL — ABNORMAL HIGH (ref 7–25)
CO2: 25 mmol/L (ref 20–31)
Calcium: 9.9 mg/dL (ref 8.6–10.4)
Chloride: 101 mmol/L (ref 98–110)
Creat: 1.22 mg/dL — ABNORMAL HIGH (ref 0.60–0.88)
Glucose, Bld: 102 mg/dL — ABNORMAL HIGH (ref 65–99)
Potassium: 4.6 mmol/L (ref 3.5–5.3)
Sodium: 139 mmol/L (ref 135–146)

## 2016-04-21 MED ORDER — METOPROLOL TARTRATE 50 MG PO TABS: 50.0000 mg | ORAL_TABLET | Freq: Two times a day (BID) | ORAL | 3 refills | Status: DC

## 2016-04-21 NOTE — Patient Instructions (Signed)
Medication Instructions:   STOP ISOSORBIDE  DECREASE METOPROLOL TO 50 MG TWICE DAILY= 1/2 OF THE 100 MG TABLET TWICE DAILY  Labwork:  Your physician recommends that you HAVE LAB WORK TODAY  Follow-Up:  Your physician wants you to follow-up in: 3 [place] will receive a reminder letter in the mail two months in advance. If you don't receive a letter, please call our office to schedule the follow-up appointment.   If you need a refill on your cardiac medications before your next appointment, please call your pharmacy.

## 2016-04-22 ENCOUNTER — Telehealth: Payer: Self-pay | Admitting: *Deleted

## 2016-04-22 NOTE — Telephone Encounter (Signed)
Spoke with pt, Aware of dr Jacalyn Lefevre recommendations. She repeated medication change.

## 2016-04-22 NOTE — Telephone Encounter (Signed)
-----   Message from Lelon Perla, MD sent at 04/22/2016  5:11 AM EST ----- Dc lasix Kirk Ruths

## 2016-04-22 NOTE — Telephone Encounter (Signed)
Left message for pt to call.

## 2016-04-26 ENCOUNTER — Telehealth: Payer: Self-pay | Admitting: Cardiology

## 2016-04-26 NOTE — Telephone Encounter (Signed)
Patient calling regarding burning when urinating and frequency. Asked if she had ever had UTI, stated yes and feels like what is wrong. Advised patient to call PCP, verbalized understanding

## 2016-04-26 NOTE — Telephone Encounter (Signed)
New Message   Patient states she's having some discomfort when urinating. Had appt on 1/11 and was told to stop taking her fluid pill. Wants a call back from nurse.

## 2016-05-02 NOTE — Addendum Note (Signed)
Addended by: Waylan Rocher on: 05/02/2016 12:40 PM   Modules accepted: Orders

## 2016-05-13 DIAGNOSIS — R5383 Other fatigue: Secondary | ICD-10-CM | POA: Diagnosis not present

## 2016-05-13 DIAGNOSIS — B373 Candidiasis of vulva and vagina: Secondary | ICD-10-CM | POA: Diagnosis not present

## 2016-05-13 DIAGNOSIS — N390 Urinary tract infection, site not specified: Secondary | ICD-10-CM | POA: Diagnosis not present

## 2016-05-13 DIAGNOSIS — Z01419 Encounter for gynecological examination (general) (routine) without abnormal findings: Secondary | ICD-10-CM | POA: Diagnosis not present

## 2016-07-22 ENCOUNTER — Telehealth: Payer: Self-pay | Admitting: Cardiology

## 2016-07-22 NOTE — Telephone Encounter (Signed)
Patient notified of MD advice. Voiced understanding.

## 2016-07-22 NOTE — Telephone Encounter (Signed)
Returned call to patient She states she "just don't feel good" She states she feels like she will pass out and don't breathe good Occasional lightheadedness/dizziness She states she feels like this sometimes but it has worsened "I just can't do anything" "I just give out" Denies chest pain, denies palpitations Has not checked BP/HR  Patient's appt moved to soonest available APP appt on 4/23 w/B. Rosita Fire, [place] Will defer to DOD Dr. Percival Spanish & Hilda Blades, RN for advice

## 2016-07-22 NOTE — Telephone Encounter (Signed)
If she gets worse, ER eval; otherwise fu as scheduled; check HR and BP at home [facility]

## 2016-07-22 NOTE — Telephone Encounter (Signed)
New Message  Pt voiced wanting to speak to nurse due to her not feeling well.

## 2016-08-01 ENCOUNTER — Encounter: Payer: Self-pay | Admitting: Cardiology

## 2016-08-01 ENCOUNTER — Ambulatory Visit (INDEPENDENT_AMBULATORY_CARE_PROVIDER_SITE_OTHER): Payer: PPO | Admitting: Cardiology

## 2016-08-01 VITALS — BP 132/65 | HR 62 | Ht 63.0 in | Wt 133.6 lb

## 2016-08-01 DIAGNOSIS — E559 Vitamin D deficiency, unspecified: Secondary | ICD-10-CM | POA: Diagnosis not present

## 2016-08-01 DIAGNOSIS — R5383 Other fatigue: Secondary | ICD-10-CM

## 2016-08-01 LAB — CBC
HCT: 39.3 % (ref 35.0–45.0)
Hemoglobin: 12.9 g/dL (ref 11.7–15.5)
MCH: 31.1 pg (ref 27.0–33.0)
MCHC: 32.8 g/dL (ref 32.0–36.0)
MCV: 94.7 fL (ref 80.0–100.0)
MPV: 10.6 fL (ref 7.5–12.5)
Platelets: 224 10*3/uL (ref 140–400)
RBC: 4.15 MIL/uL (ref 3.80–5.10)
RDW: 13.2 % (ref 11.0–15.0)
WBC: 6.8 10*3/uL (ref 3.8–10.8)

## 2016-08-01 LAB — TSH: TSH: 2.23 mIU/L

## 2016-08-01 NOTE — Progress Notes (Signed)
08/01/2016 [name]   09-30-1920  [id number]  Primary Physician No primary care provider on file. Primary Cardiologist: Dr. Stanford Breed  Reason for Visit/CC: 3 month f/u w/ new complaint of fatigue  HPI:  [name] is a 83 y.o. female who has been followed by Dr. Stanford Breed. She has a h/o CAD and severe HTN. H/o of PCI to the pLAD in 4/09 with a Palmaz DES and Promus DES to [place] in 11/11. LHC 02/23/12 performed for chest pain and dyspnea: pLAD 20%, mLAD stent patent, dLAD 60%, oD1 70% (small), small CFX with one AV groove OM1 branch with mid diffuse 80%-no change from 2011, oRCA 40%, dRCA stent patent, EF 75% - medical Rx recommended. Nuclear study November 2017 showed ejection fraction 82% with no ischemia or infarction. Pt was evaluated in the fall of 2017 for dyspnea on exertion. BNP 109. Hemoglobin normal and creatinine 0.88. VQ scan December 2017 w/ low probability. Echocardiogram December 2017 showed normal LV function, grade 1 diastolic dysfunction, elevated left ventricular filling pressures and mild tricuspid regurgitation. Lasix 20 mg daily added, but reduced to 10 mg daily as she was having incontinence with higher dose.  She was last seen by Dr. Stanford Breed 04/21/16. At that time, she continued to describe weakness and DOE, however denied orthopnea, PND, pedal edema, chest pain or syncope. There was concerns that perhaps mildly reduced heart rate and blood pressure was contributing to fatigue. Dr. Stanford Breed elected to discontinue her Isosorbide and decreased her metoprolol to 50 mg BID. He also outlined that he recommended conservative management, given her advanced age. For this reason, he did not pursue any additional aggressive w/u. She was instructed to f/u in 3 months.   She returns to clinic today sill noting significant fatigue and DOE. She has been closely monitoring both her blood pressure and heart rates at home. Systolic blood pressures have ranged in the 110s to 12s0.  Diastolic blood pressures in the 60s to 70s and heart rate has  Averaged in the mid 60s.  Her symptoms are mostly with activity including her ADLs. She often has to stop in between activities such as getting dressed and bathing. She also now has someone assisting her with household chore.  However, at rest she is fairly comfortable. She denies any resting dyspnea. No chest pain. She is stable on her feet and walks unassisted without use of a walker or a cane. She denies any history of falls. EKG today demonstrates sinus rhythm with first-degree AV block. Heart rate 61 bpm. Otherwise normal EKG without ischemic abnormalities. Her blood pressure is controlled at 132/65. She reports good by mouth intake. She does not get good rest at night due to insomnia. She takes over-the-counter melatonin for this. It is also noted that she has a history of hypothyroidism as well as vitamin D deficiency, currently on thyroid replacement as well as calcium carbonate/vitamin C supplement.    Current Meds  Medication Sig  . amLODipine (NORVASC) 10 MG tablet   . aspirin 81 MG tablet Take 1 tablet (81 mg total) by mouth daily.  . Calcium Carbonate-Vitamin D (CALCIUM 600+D) 600-400 MG-UNIT per tablet Take 1 tablet by mouth daily.    . enalapril (VASOTEC) 10 MG tablet Take 1 tablet (10 mg total) by mouth daily.  Marland Kitchen levothyroxine (SYNTHROID, LEVOTHROID) 100 MCG tablet Take 1 tablet by mouth daily.  . metoprolol (LOPRESSOR) 50 MG tablet Take 1 tablet (50 mg total) by mouth 2 (two) times daily.  Marland Kitchen  Multiple Vitamin (MULTIVITAMIN) tablet Take 1 tablet by mouth daily.    . nitroGLYCERIN (NITROSTAT) 0.4 MG SL tablet Place 0.4 mg under the tongue every 5 (five) minutes as needed. For chest pain  . simvastatin (ZOCOR) 20 MG tablet Take 20 mg by mouth at bedtime.  . traZODone (DESYREL) 50 MG tablet Take 1 tablet by mouth daily.  . vitamin B-12 (CYANOCOBALAMIN) 500 MCG tablet Take 500 mcg by mouth daily.   No Known Allergies Past  Medical History:  Diagnosis Date  . CAD (coronary artery disease)    a. 07/2007 PCI/Promus DES to LAD;  b. 02/2010 PCI/Promus DES to dRCA.;  c. 12/2010 Lexi MV: EF 88%, no isch/infarct.;  d. 02/2012 Cath: LM nl, LAD 20p, patent mid stent, 60d, D1 sm 70ost, D2 sm, diff, LCX sm , OM1 diff 66m (no change), RCA 40ost, patent stent, PD/PL nl, EF 75% w/o rwma's.  . Carotid bruit   . Diverticulosis   . GERD (gastroesophageal reflux disease)   . Hemorrhoids   . History of echocardiogram    a. 06/2012 Echo: EF nl, mild LVH.  Marland Kitchen HLD (hyperlipidemia)   . HTN (hypertension)   . Hypothyroidism   . IBS (irritable bowel syndrome)    Family History  Problem Relation Age of Onset  . Myasthenia gravis Mother   . Hip fracture Father   . Heart disease      several uncles with CAD   Past Surgical History:  Procedure Laterality Date  . BACK SURGERY    . CORONARY ANGIOPLASTY WITH STENT PLACEMENT    . hysterectomy - unknown type    . LEFT HEART CATHETERIZATION WITH CORONARY ANGIOGRAM N/A 02/13/2012   Procedure: LEFT HEART CATHETERIZATION WITH CORONARY ANGIOGRAM;  Surgeon: Josue Hector, MD;  Location: [facility] CATH LAB;  Service: Cardiovascular;  Laterality: N/A;  . RECTOCELE REPAIR     Social History   Social History  . Marital status: Widowed    Spouse name: N/A  . Number of children: 4  . Years of education: N/A   Occupational History  . retired    Social History Main Topics  . Smoking status: Never Smoker  . Smokeless tobacco: Never Used  . Alcohol use No  . Drug use: No  . Sexual activity: Not on file   Other Topics Concern  . Not on file   Social History Narrative   Lives in [place].     Review of Systems: General: negative for chills, fever, night sweats or weight changes.  Cardiovascular: negative for chest pain, dyspnea on exertion, edema, orthopnea, palpitations, paroxysmal nocturnal dyspnea or shortness of breath Dermatological: negative for rash Respiratory: negative for cough or  wheezing Urologic: negative for hematuria Abdominal: negative for nausea, vomiting, diarrhea, bright red blood per rectum, melena, or hematemesis Neurologic: negative for visual changes, syncope, or dizziness All other systems reviewed and are otherwise negative except as noted above.   Physical Exam:  Blood pressure 132/65, pulse 62, height 5\' 3"  (1.6 m), weight 133 lb 9.6 oz (60.6 kg).  General appearance: alert, cooperative, no distress and elderly Neck: no carotid bruit and no JVD Lungs: clear to auscultation bilaterally Heart: regular rate and rhythm, S1, S2 normal, no murmur, click, rub or gallop Extremities: extremities normal, atraumatic, no cyanosis or edema Pulses: 2+ and symmetric Skin: Skin color, texture, turgor normal. No rashes or lesions Neurologic: Grossly normal  EKG NSR. 61 bpm. No ischemic changes, --personally reviewed   ASSESSMENT AND PLAN:   1. Fatigue  and Exertional Dyspnea: patient remaines symptomatic despite recent medication changes made by Dr. Stanford Breed in January. He felt that perhaps mild bradycardia and lower blood pressure was contributing to her symptoms. He discontinued isosorbide and reduced her Metoprolol 50 mg twice a day. This was also in the setting of recent workup that was fairly unremarkable. In November and December 2017 she had a normal nuclear stress test as well as normal 2-D echocardiogram with normal EF. BNP was only mildly elevated at 200. She was placed on low-dose Lasix. VQ scan was also low probability for PE. CBC was also negative for anemia with a hemoglobin of 14.   Her EKG today demonstrates normal sinus rhythm with first-degree AV block. Heart rate 61 bpm otherwise normal EKG. Blood pressure is well-controlled and stable at 132/65. She denies any resting dyspnea. No orthopnea or PND. No recent chest discomfort. We discussed that her fatigue/decreased exercise tolerance and exertional dyspnea may just be age-related. She is 83 years old  and about to turn 33. Given her advanced age and lack of chest discomfort, I do not recommend any additional aggressive cardiac workup. Given her complaints and history of hypothyroidism and vitamin D deficiency, will plan to check thyroid function studies as well as vitamin D level today. Will also recheck CBC to assure that she hasn't developed any new anemia. Otherwise, we will continue current plan of care. She can follow-up with Dr. Stanford Breed in 6 months. I discussed recommendations with the patient and her son. They're both in agreement to this plan.   Timoth Schara Ladoris Gene, MHS [facility] 08/01/2016 1:48 PM

## 2016-08-01 NOTE — Patient Instructions (Signed)
Medication Instructions:  Your physician recommends that you continue on your current medications as directed. Please refer to the Current Medication list given to you today.  If you need a refill on your cardiac medications before your next appointment, please call your pharmacy.  Labwork: TSH, CBC AND VITAMIN D TODAY AT [facility] ON THE 1ST FLOOR  Follow-Up: Your physician wants you to follow-up in: [place] will receive a reminder letter in the mail two months in advance. If you don't receive a letter, please call our office AUGUST 2018 to schedule the October 2018 follow-up appointment.  Thank you for choosing [facility] at [facility]!!

## 2016-08-02 LAB — VITAMIN D 25 HYDROXY (VIT D DEFICIENCY, FRACTURES): Vit D, 25-Hydroxy: 55 ng/mL (ref 30–100)

## 2016-08-03 ENCOUNTER — Ambulatory Visit: Payer: PPO | Admitting: Student

## 2016-10-11 DIAGNOSIS — M545 Low back pain: Secondary | ICD-10-CM | POA: Diagnosis not present

## 2016-10-11 DIAGNOSIS — N390 Urinary tract infection, site not specified: Secondary | ICD-10-CM | POA: Diagnosis not present

## 2016-10-11 DIAGNOSIS — R5383 Other fatigue: Secondary | ICD-10-CM | POA: Diagnosis not present

## 2016-12-15 DIAGNOSIS — K219 Gastro-esophageal reflux disease without esophagitis: Secondary | ICD-10-CM | POA: Diagnosis not present

## 2016-12-15 DIAGNOSIS — Z0000 Encounter for general adult medical examination without abnormal findings: Secondary | ICD-10-CM | POA: Diagnosis not present

## 2016-12-15 DIAGNOSIS — N390 Urinary tract infection, site not specified: Secondary | ICD-10-CM | POA: Diagnosis not present

## 2016-12-15 DIAGNOSIS — E559 Vitamin D deficiency, unspecified: Secondary | ICD-10-CM | POA: Diagnosis not present

## 2016-12-15 DIAGNOSIS — E7800 Pure hypercholesterolemia, unspecified: Secondary | ICD-10-CM | POA: Diagnosis not present

## 2016-12-15 DIAGNOSIS — I10 Essential (primary) hypertension: Secondary | ICD-10-CM | POA: Diagnosis not present

## 2016-12-22 DIAGNOSIS — N390 Urinary tract infection, site not specified: Secondary | ICD-10-CM | POA: Diagnosis not present

## 2016-12-22 DIAGNOSIS — R319 Hematuria, unspecified: Secondary | ICD-10-CM | POA: Diagnosis not present

## 2016-12-22 DIAGNOSIS — E7800 Pure hypercholesterolemia, unspecified: Secondary | ICD-10-CM | POA: Diagnosis not present

## 2016-12-22 DIAGNOSIS — E039 Hypothyroidism, unspecified: Secondary | ICD-10-CM | POA: Diagnosis not present

## 2016-12-22 DIAGNOSIS — E559 Vitamin D deficiency, unspecified: Secondary | ICD-10-CM | POA: Diagnosis not present

## 2016-12-22 DIAGNOSIS — Z23 Encounter for immunization: Secondary | ICD-10-CM | POA: Diagnosis not present

## 2016-12-22 DIAGNOSIS — I10 Essential (primary) hypertension: Secondary | ICD-10-CM | POA: Diagnosis not present

## 2017-02-06 DIAGNOSIS — K5790 Diverticulosis of intestine, part unspecified, without perforation or abscess without bleeding: Secondary | ICD-10-CM | POA: Insufficient documentation

## 2017-02-06 DIAGNOSIS — E039 Hypothyroidism, unspecified: Secondary | ICD-10-CM | POA: Insufficient documentation

## 2017-02-06 DIAGNOSIS — R0989 Other specified symptoms and signs involving the circulatory and respiratory systems: Secondary | ICD-10-CM | POA: Insufficient documentation

## 2017-02-06 DIAGNOSIS — K589 Irritable bowel syndrome without diarrhea: Secondary | ICD-10-CM | POA: Insufficient documentation

## 2017-02-06 DIAGNOSIS — Z9289 Personal history of other medical treatment: Secondary | ICD-10-CM | POA: Insufficient documentation

## 2017-02-06 DIAGNOSIS — K219 Gastro-esophageal reflux disease without esophagitis: Secondary | ICD-10-CM | POA: Insufficient documentation

## 2017-02-09 NOTE — Progress Notes (Signed)
HPI: Fu CAD. She has a h/o severe HTN and hx of PCI to the pLAD in 4/09 with a Palmaz DES and Promus DES to [place] in 11/11. LHC 02/23/12 performed for chest pain and dyspnea: pLAD 20%, mLAD stent patent, dLAD 60%, oD1 70% (small), small CFX with one AV groove OM1 branch with mid diffuse 80%-no change from 2011, oRCA 40%, dRCA stent patent, EF 75% - medical Rx recommended. Nuclear study November 2017 showed ejection fraction 82% with no ischemia or infarction. Patient seen previously for dyspnea on exertion. BNP 109. Hemoglobin normal and creatinine 0.88. VQ scan December 2017 lower probability. Echocardiogram December 2017 showed normal LV function, grade 1 diastolic dysfunction, elevated left ventricular filling pressures and mild tricuspid regurgitation. Lasix 20 mg daily added, but reduced to 10 mg daily as she was having incontinence with higher dose. Since last seen, patient has some dyspnea on exertion but no orthopnea, PND, pedal edema, exertional chest pain or syncope. She describes fatigue.  Current Outpatient Medications  Medication Sig Dispense Refill  . amLODipine (NORVASC) 10 MG tablet     . aspirin 81 MG tablet Take 1 tablet (81 mg total) by mouth daily.    . Calcium Carbonate-Vitamin D (CALCIUM 600+D) 600-400 MG-UNIT per tablet Take 1 tablet by mouth daily.      . enalapril (VASOTEC) 10 MG tablet Take 1 tablet (10 mg total) by mouth daily. 90 tablet 3  . levothyroxine (SYNTHROID, LEVOTHROID) 100 MCG tablet Take 1 tablet by mouth daily.    . metoprolol (LOPRESSOR) 50 MG tablet Take 1 tablet (50 mg total) by mouth 2 (two) times daily. 180 tablet 3  . Multiple Vitamin (MULTIVITAMIN) tablet Take 1 tablet by mouth daily.      . nitroGLYCERIN (NITROSTAT) 0.4 MG SL tablet Place 0.4 mg under the tongue every 5 (five) minutes as needed. For chest pain    . simvastatin (ZOCOR) 20 MG tablet Take 20 mg by mouth at bedtime.    . traZODone (DESYREL) 50 MG tablet Take 1 tablet by mouth daily.     . vitamin B-12 (CYANOCOBALAMIN) 500 MCG tablet Take 500 mcg by mouth daily.     No current facility-administered medications for this visit.      Past Medical History:  Diagnosis Date  . CAD (coronary artery disease)    a. 07/2007 PCI/Promus DES to LAD;  b. 02/2010 PCI/Promus DES to dRCA.;  c. 12/2010 Lexi MV: EF 88%, no isch/infarct.;  d. 02/2012 Cath: LM nl, LAD 20p, patent mid stent, 60d, D1 sm 70ost, D2 sm, diff, LCX sm , OM1 diff 48m (no change), RCA 40ost, patent stent, PD/PL nl, EF 75% w/o rwma's.  . Carotid bruit   . Diverticulosis   . GERD (gastroesophageal reflux disease)   . Hemorrhoids   . History of echocardiogram    a. 06/2012 Echo: EF nl, mild LVH.  Marland Kitchen HLD (hyperlipidemia)   . HTN (hypertension)   . Hypothyroidism   . IBS (irritable bowel syndrome)     Past Surgical History:  Procedure Laterality Date  . BACK SURGERY    . CORONARY ANGIOPLASTY WITH STENT PLACEMENT    . hysterectomy - unknown type    . RECTOCELE REPAIR      Social History   Socioeconomic History  . Marital status: Widowed    Spouse name: Not on file  . Number of children: 4  . Years of education: Not on file  . Highest education level: Not on  file  Social Needs  . Financial resource strain: Not on file  . Food insecurity - worry: Not on file  . Food insecurity - inability: Not on file  . Transportation needs - medical: Not on file  . Transportation needs - non-medical: Not on file  Occupational History  . Occupation: retired  Tobacco Use  . Smoking status: Never Smoker  . Smokeless tobacco: Never Used  Substance and Sexual Activity  . Alcohol use: No  . Drug use: No  . Sexual activity: Not on file  Other Topics Concern  . Not on file  Social History Narrative   Lives in [place].    Family History  Problem Relation Age of Onset  . Myasthenia gravis Mother   . Hip fracture Father   . Heart disease Unknown        several uncles with CAD    ROS: arthralgias but no fevers or  chills, productive cough, hemoptysis, dysphasia, odynophagia, melena, hematochezia, dysuria, hematuria, rash, seizure activity, orthopnea, PND, pedal edema, claudication. Remaining systems are negative.  Physical Exam: Well-developed well-nourished in no acute distress.  Skin is warm and dry.  HEENT is normal.  Neck is supple.  Chest is clear to auscultation with normal expansion.  Cardiovascular exam is regular rate and rhythm.  Abdominal exam nontender or distended. No masses palpated. Extremities show no edema. neuro grossly intact   A/P  1 dyspnea/fatigue-etiology remains unclear. She had an extensive workup previously including chest x-ray which revealed COPD, negative VQ scan, and minimally elevated BNP, nuclear study showing no ischemia and echocardiogram showing vigorous LV function. This may be related to deconditioning. I will not pursue further evaluation. I encouraged increased activities.  2 Coronary artery disease-continue aspirin and statin.  3 hypertension-blood pressure is controlled. Continue present medications.  4 hyperlipidemia-continue statin.  Kirk Ruths, MD

## 2017-02-20 ENCOUNTER — Ambulatory Visit: Payer: PPO | Admitting: Cardiology

## 2017-02-20 ENCOUNTER — Encounter: Payer: Self-pay | Admitting: Cardiology

## 2017-02-20 VITALS — BP 106/52 | HR 66 | Ht 63.0 in | Wt 130.0 lb

## 2017-02-20 DIAGNOSIS — E7800 Pure hypercholesterolemia, unspecified: Secondary | ICD-10-CM

## 2017-02-20 DIAGNOSIS — I2510 Atherosclerotic heart disease of native coronary artery without angina pectoris: Secondary | ICD-10-CM | POA: Diagnosis not present

## 2017-02-20 DIAGNOSIS — R0600 Dyspnea, unspecified: Secondary | ICD-10-CM

## 2017-02-20 DIAGNOSIS — I10 Essential (primary) hypertension: Secondary | ICD-10-CM | POA: Diagnosis not present

## 2017-02-20 NOTE — Patient Instructions (Signed)
Your physician wants you to follow-up in: 6 MONTHS WITH DR CRENSHAW You will receive a reminder letter in the mail two months in advance. If you don't receive a letter, please call our office to schedule the follow-up appointment.   If you need a refill on your cardiac medications before your next appointment, please call your pharmacy.  

## 2017-03-24 ENCOUNTER — Other Ambulatory Visit: Payer: Self-pay | Admitting: Nurse Practitioner

## 2017-03-24 NOTE — Telephone Encounter (Signed)
REFILL 

## 2017-03-29 ENCOUNTER — Other Ambulatory Visit: Payer: Self-pay | Admitting: Cardiology

## 2017-06-16 DIAGNOSIS — E039 Hypothyroidism, unspecified: Secondary | ICD-10-CM | POA: Diagnosis not present

## 2017-06-16 DIAGNOSIS — E559 Vitamin D deficiency, unspecified: Secondary | ICD-10-CM | POA: Diagnosis not present

## 2017-06-16 DIAGNOSIS — E7800 Pure hypercholesterolemia, unspecified: Secondary | ICD-10-CM | POA: Diagnosis not present

## 2017-06-21 DIAGNOSIS — E039 Hypothyroidism, unspecified: Secondary | ICD-10-CM | POA: Diagnosis not present

## 2017-06-21 DIAGNOSIS — I2510 Atherosclerotic heart disease of native coronary artery without angina pectoris: Secondary | ICD-10-CM | POA: Diagnosis not present

## 2017-06-21 DIAGNOSIS — E559 Vitamin D deficiency, unspecified: Secondary | ICD-10-CM | POA: Diagnosis not present

## 2017-06-21 DIAGNOSIS — I10 Essential (primary) hypertension: Secondary | ICD-10-CM | POA: Diagnosis not present

## 2017-06-21 DIAGNOSIS — E7800 Pure hypercholesterolemia, unspecified: Secondary | ICD-10-CM | POA: Diagnosis not present

## 2017-09-17 ENCOUNTER — Other Ambulatory Visit: Payer: Self-pay | Admitting: Nurse Practitioner

## 2017-11-03 NOTE — Progress Notes (Signed)
HPI: Fu CAD. She has a h/o severe HTN and hx of PCI to the pLAD in 4/09 with a Palmaz DES and Promus DES to [place] in 11/11. LHC 02/23/12 performed for chest pain and dyspnea: pLAD 20%, mLAD stent patent, dLAD 60%, oD1 70% (small), small CFX with one AV groove OM1 branch with mid diffuse 80%-no change from 2011, oRCA 40%, dRCA stent patent, EF 75% - medical Rx recommended. Nuclear study November 2017 showed ejection fraction 82% with no ischemia or infarction. Patient seen previously for dyspnea on exertion. BNP 109. Hemoglobin normal and creatinine 0.88. VQ scan December 2017 lower probability.Echocardiogram December 2017 showed normal LV function, grade 1 diastolic dysfunction, elevated left ventricular filling pressures and mild tricuspid regurgitation. Lasix 20 mg daily added, but reduced to 10 mg daily as she was having incontinence with higher dose. Since last seen,  she has some dyspnea and fatigue with activities but denies orthopnea, PND, chest pain or syncope.  Current Outpatient Medications  Medication Sig Dispense Refill  . amLODipine (NORVASC) 10 MG tablet     . aspirin 81 MG tablet Take 1 tablet (81 mg total) by mouth daily.    . Calcium Carbonate-Vitamin D (CALCIUM 600+D) 600-400 MG-UNIT per tablet Take 1 tablet by mouth daily.      . enalapril (VASOTEC) 10 MG tablet Take 1 tablet (10 mg total) by mouth daily. 90 tablet 3  . isosorbide mononitrate (IMDUR) 30 MG 24 hr tablet TAKE 0.5 TABLETS (15 MG TOTAL) BY MOUTH DAILY. 45 tablet 4  . levothyroxine (SYNTHROID, LEVOTHROID) 100 MCG tablet Take 1 tablet by mouth daily.    . metoprolol tartrate (LOPRESSOR) 50 MG tablet TAKE 1 TABLET BY MOUTH TWICE A DAY 180 tablet 3  . Multiple Vitamin (MULTIVITAMIN) tablet Take 1 tablet by mouth daily.      . nitroGLYCERIN (NITROSTAT) 0.4 MG SL tablet Place 0.4 mg under the tongue every 5 (five) minutes as needed. For chest pain    . simvastatin (ZOCOR) 20 MG tablet Take 20 mg by mouth at bedtime.     . traZODone (DESYREL) 50 MG tablet Take 1 tablet by mouth daily.    . vitamin B-12 (CYANOCOBALAMIN) 500 MCG tablet Take 500 mcg by mouth daily.     No current facility-administered medications for this visit.      Past Medical History:  Diagnosis Date  . CAD (coronary artery disease)    a. 07/2007 PCI/Promus DES to LAD;  b. 02/2010 PCI/Promus DES to dRCA.;  c. 12/2010 Lexi MV: EF 88%, no isch/infarct.;  d. 02/2012 Cath: LM nl, LAD 20p, patent mid stent, 60d, D1 sm 70ost, D2 sm, diff, LCX sm , OM1 diff 43m (no change), RCA 40ost, patent stent, PD/PL nl, EF 75% w/o rwma's.  . Carotid bruit   . Diverticulosis   . GERD (gastroesophageal reflux disease)   . Hemorrhoids   . History of echocardiogram    a. 06/2012 Echo: EF nl, mild LVH.  Marland Kitchen HLD (hyperlipidemia)   . HTN (hypertension)   . Hypothyroidism   . IBS (irritable bowel syndrome)     Past Surgical History:  Procedure Laterality Date  . BACK SURGERY    . CORONARY ANGIOPLASTY WITH STENT PLACEMENT    . hysterectomy - unknown type    . LEFT HEART CATHETERIZATION WITH CORONARY ANGIOGRAM N/A 02/13/2012   Procedure: LEFT HEART CATHETERIZATION WITH CORONARY ANGIOGRAM;  Surgeon: Josue Hector, MD;  Location: [facility] CATH LAB;  Service: Cardiovascular;  Laterality: N/A;  . RECTOCELE REPAIR      Social History   Socioeconomic History  . Marital status: Widowed    Spouse name: Not on file  . Number of children: 4  . Years of education: Not on file  . Highest education level: Not on file  Occupational History  . Occupation: retired  Scientific laboratory technician  . Financial resource strain: Not on file  . Food insecurity:    Worry: Not on file    Inability: Not on file  . Transportation needs:    Medical: Not on file    Non-medical: Not on file  Tobacco Use  . Smoking status: Never Smoker  . Smokeless tobacco: Never Used  Substance and Sexual Activity  . Alcohol use: No  . Drug use: No  . Sexual activity: Not on file  Lifestyle  . Physical  activity:    Days per week: Not on file    Minutes per session: Not on file  . Stress: Not on file  Relationships  . Social connections:    Talks on phone: Not on file    Gets together: Not on file    Attends religious service: Not on file    Active member of club or organization: Not on file    Attends meetings of clubs or organizations: Not on file    Relationship status: Not on file  . Intimate partner violence:    Fear of current or ex partner: Not on file    Emotionally abused: Not on file    Physically abused: Not on file    Forced sexual activity: Not on file  Other Topics Concern  . Not on file  Social History Narrative   Lives in [place].    Family History  Problem Relation Age of Onset  . Myasthenia gravis Mother   . Hip fracture Father   . Heart disease Unknown        several uncles with CAD    ROS: Fatigue but no fevers or chills, productive cough, hemoptysis, dysphasia, odynophagia, melena, hematochezia, dysuria, hematuria, rash, seizure activity, orthopnea, PND, pedal edema, claudication. Remaining systems are negative.  Physical Exam: Well-developed well-nourished in no acute distress.  Skin is warm and dry.  HEENT is normal.  Neck is supple.  Chest is clear to auscultation with normal expansion.  Cardiovascular exam is regular rate and rhythm.  Abdominal exam nontender or distended. No masses palpated. Extremities show trace edema. neuro grossly intact  ECG-sinus rhythm with first-degree AV block, normal axis, occasional PVC.  Personally reviewed  A/P  1 fatigue/dyspnea-symptoms persist but may be slightly better.  However she has had an extensive evaluation previously including negative VQ scan, minimally elevated BNP, nuclear study showing no ischemia and echocardiogram showing vigorous LV function.  Chest x-ray showed COPD.  This may be related to her age and deconditioning.  2 coronary artery disease-patient denies chest pain.  Continue medical  therapy with aspirin and statin.  3 hypertension-blood pressure is controlled.  Continue present medications.  4 hyperlipidemia-continue statin.  Kirk Ruths, MD

## 2017-11-07 ENCOUNTER — Encounter: Payer: Self-pay | Admitting: Cardiology

## 2017-11-07 ENCOUNTER — Ambulatory Visit (INDEPENDENT_AMBULATORY_CARE_PROVIDER_SITE_OTHER): Payer: PPO | Admitting: Cardiology

## 2017-11-07 VITALS — BP 130/60 | HR 72 | Ht 66.0 in | Wt 132.2 lb

## 2017-11-07 DIAGNOSIS — E7800 Pure hypercholesterolemia, unspecified: Secondary | ICD-10-CM | POA: Diagnosis not present

## 2017-11-07 DIAGNOSIS — I10 Essential (primary) hypertension: Secondary | ICD-10-CM | POA: Diagnosis not present

## 2017-11-07 DIAGNOSIS — I2510 Atherosclerotic heart disease of native coronary artery without angina pectoris: Secondary | ICD-10-CM

## 2017-11-07 NOTE — Patient Instructions (Signed)
Your physician wants you to follow-up in: ONE YEAR WITH DR CRENSHAW You will receive a reminder letter in the mail two months in advance. If you don't receive a letter, please call our office to schedule the follow-up appointment.   If you need a refill on your cardiac medications before your next appointment, please call your pharmacy.  

## 2017-12-26 DIAGNOSIS — Z0000 Encounter for general adult medical examination without abnormal findings: Secondary | ICD-10-CM | POA: Diagnosis not present

## 2017-12-26 DIAGNOSIS — E559 Vitamin D deficiency, unspecified: Secondary | ICD-10-CM | POA: Diagnosis not present

## 2017-12-26 DIAGNOSIS — E039 Hypothyroidism, unspecified: Secondary | ICD-10-CM | POA: Diagnosis not present

## 2017-12-26 DIAGNOSIS — I10 Essential (primary) hypertension: Secondary | ICD-10-CM | POA: Diagnosis not present

## 2018-01-02 DIAGNOSIS — N390 Urinary tract infection, site not specified: Secondary | ICD-10-CM | POA: Diagnosis not present

## 2018-01-02 DIAGNOSIS — E7800 Pure hypercholesterolemia, unspecified: Secondary | ICD-10-CM | POA: Diagnosis not present

## 2018-01-02 DIAGNOSIS — I2510 Atherosclerotic heart disease of native coronary artery without angina pectoris: Secondary | ICD-10-CM | POA: Diagnosis not present

## 2018-01-02 DIAGNOSIS — E039 Hypothyroidism, unspecified: Secondary | ICD-10-CM | POA: Diagnosis not present

## 2018-01-02 DIAGNOSIS — Z23 Encounter for immunization: Secondary | ICD-10-CM | POA: Diagnosis not present

## 2018-01-02 DIAGNOSIS — I10 Essential (primary) hypertension: Secondary | ICD-10-CM | POA: Diagnosis not present

## 2018-01-02 DIAGNOSIS — Z0000 Encounter for general adult medical examination without abnormal findings: Secondary | ICD-10-CM | POA: Diagnosis not present

## 2018-01-02 DIAGNOSIS — E559 Vitamin D deficiency, unspecified: Secondary | ICD-10-CM | POA: Diagnosis not present

## 2018-02-15 DIAGNOSIS — N390 Urinary tract infection, site not specified: Secondary | ICD-10-CM | POA: Diagnosis not present

## 2018-02-15 DIAGNOSIS — R5383 Other fatigue: Secondary | ICD-10-CM | POA: Diagnosis not present

## 2018-03-05 DIAGNOSIS — E039 Hypothyroidism, unspecified: Secondary | ICD-10-CM | POA: Diagnosis not present

## 2018-03-10 ENCOUNTER — Other Ambulatory Visit: Payer: Self-pay | Admitting: Cardiology

## 2018-03-13 NOTE — Telephone Encounter (Signed)
Rx sent to pharmacy   

## 2018-03-28 DIAGNOSIS — N3020 Other chronic cystitis without hematuria: Secondary | ICD-10-CM | POA: Diagnosis not present

## 2018-03-28 DIAGNOSIS — B3741 Candidal cystitis and urethritis: Secondary | ICD-10-CM | POA: Diagnosis not present

## 2018-03-29 DIAGNOSIS — E039 Hypothyroidism, unspecified: Secondary | ICD-10-CM | POA: Diagnosis not present

## 2018-06-12 DIAGNOSIS — E039 Hypothyroidism, unspecified: Secondary | ICD-10-CM | POA: Diagnosis not present

## 2018-06-12 DIAGNOSIS — I10 Essential (primary) hypertension: Secondary | ICD-10-CM | POA: Diagnosis not present

## 2018-06-12 DIAGNOSIS — R2681 Unsteadiness on feet: Secondary | ICD-10-CM | POA: Diagnosis not present

## 2018-06-22 DIAGNOSIS — L603 Nail dystrophy: Secondary | ICD-10-CM | POA: Diagnosis not present

## 2018-06-22 DIAGNOSIS — M79672 Pain in left foot: Secondary | ICD-10-CM | POA: Diagnosis not present

## 2018-06-22 DIAGNOSIS — I739 Peripheral vascular disease, unspecified: Secondary | ICD-10-CM | POA: Diagnosis not present

## 2018-06-22 DIAGNOSIS — M79671 Pain in right foot: Secondary | ICD-10-CM | POA: Diagnosis not present

## 2018-06-22 DIAGNOSIS — L600 Ingrowing nail: Secondary | ICD-10-CM | POA: Diagnosis not present

## 2018-06-22 DIAGNOSIS — B351 Tinea unguium: Secondary | ICD-10-CM | POA: Diagnosis not present

## 2018-07-31 DIAGNOSIS — I2510 Atherosclerotic heart disease of native coronary artery without angina pectoris: Secondary | ICD-10-CM | POA: Diagnosis not present

## 2018-07-31 DIAGNOSIS — E7800 Pure hypercholesterolemia, unspecified: Secondary | ICD-10-CM | POA: Diagnosis not present

## 2018-07-31 DIAGNOSIS — E039 Hypothyroidism, unspecified: Secondary | ICD-10-CM | POA: Diagnosis not present

## 2018-07-31 DIAGNOSIS — Z79899 Other long term (current) drug therapy: Secondary | ICD-10-CM | POA: Diagnosis not present

## 2018-07-31 DIAGNOSIS — I10 Essential (primary) hypertension: Secondary | ICD-10-CM | POA: Diagnosis not present

## 2018-07-31 DIAGNOSIS — F5104 Psychophysiologic insomnia: Secondary | ICD-10-CM | POA: Diagnosis not present

## 2018-08-28 DIAGNOSIS — I10 Essential (primary) hypertension: Secondary | ICD-10-CM | POA: Diagnosis not present

## 2018-08-28 DIAGNOSIS — E7800 Pure hypercholesterolemia, unspecified: Secondary | ICD-10-CM | POA: Diagnosis not present

## 2018-08-28 DIAGNOSIS — E039 Hypothyroidism, unspecified: Secondary | ICD-10-CM | POA: Diagnosis not present

## 2018-08-28 DIAGNOSIS — Z79899 Other long term (current) drug therapy: Secondary | ICD-10-CM | POA: Diagnosis not present

## 2018-08-28 DIAGNOSIS — F5104 Psychophysiologic insomnia: Secondary | ICD-10-CM | POA: Diagnosis not present

## 2018-08-28 DIAGNOSIS — I2510 Atherosclerotic heart disease of native coronary artery without angina pectoris: Secondary | ICD-10-CM | POA: Diagnosis not present

## 2018-09-10 DIAGNOSIS — M25512 Pain in left shoulder: Secondary | ICD-10-CM | POA: Diagnosis not present

## 2018-09-13 DIAGNOSIS — S42295A Other nondisplaced fracture of upper end of left humerus, initial encounter for closed fracture: Secondary | ICD-10-CM | POA: Diagnosis not present

## 2018-09-21 DIAGNOSIS — K219 Gastro-esophageal reflux disease without esophagitis: Secondary | ICD-10-CM | POA: Diagnosis not present

## 2018-09-21 DIAGNOSIS — I10 Essential (primary) hypertension: Secondary | ICD-10-CM | POA: Diagnosis not present

## 2018-09-21 DIAGNOSIS — Z9181 History of falling: Secondary | ICD-10-CM | POA: Diagnosis not present

## 2018-09-21 DIAGNOSIS — M1990 Unspecified osteoarthritis, unspecified site: Secondary | ICD-10-CM | POA: Diagnosis not present

## 2018-09-21 DIAGNOSIS — S42201D Unspecified fracture of upper end of right humerus, subsequent encounter for fracture with routine healing: Secondary | ICD-10-CM | POA: Diagnosis not present

## 2018-09-21 DIAGNOSIS — F419 Anxiety disorder, unspecified: Secondary | ICD-10-CM | POA: Diagnosis not present

## 2018-09-24 DIAGNOSIS — E039 Hypothyroidism, unspecified: Secondary | ICD-10-CM | POA: Diagnosis not present

## 2018-09-24 DIAGNOSIS — F5104 Psychophysiologic insomnia: Secondary | ICD-10-CM | POA: Diagnosis not present

## 2018-09-24 DIAGNOSIS — I10 Essential (primary) hypertension: Secondary | ICD-10-CM | POA: Diagnosis not present

## 2018-09-24 DIAGNOSIS — Z7189 Other specified counseling: Secondary | ICD-10-CM | POA: Diagnosis not present

## 2018-09-24 DIAGNOSIS — E7800 Pure hypercholesterolemia, unspecified: Secondary | ICD-10-CM | POA: Diagnosis not present

## 2018-09-24 DIAGNOSIS — Z79899 Other long term (current) drug therapy: Secondary | ICD-10-CM | POA: Diagnosis not present

## 2018-09-25 DIAGNOSIS — I10 Essential (primary) hypertension: Secondary | ICD-10-CM | POA: Diagnosis not present

## 2018-09-25 DIAGNOSIS — S42201D Unspecified fracture of upper end of right humerus, subsequent encounter for fracture with routine healing: Secondary | ICD-10-CM | POA: Diagnosis not present

## 2018-09-25 DIAGNOSIS — F419 Anxiety disorder, unspecified: Secondary | ICD-10-CM | POA: Diagnosis not present

## 2018-09-25 DIAGNOSIS — K219 Gastro-esophageal reflux disease without esophagitis: Secondary | ICD-10-CM | POA: Diagnosis not present

## 2018-09-25 DIAGNOSIS — M1990 Unspecified osteoarthritis, unspecified site: Secondary | ICD-10-CM | POA: Diagnosis not present

## 2018-09-25 DIAGNOSIS — Z9181 History of falling: Secondary | ICD-10-CM | POA: Diagnosis not present

## 2018-10-09 ENCOUNTER — Other Ambulatory Visit: Payer: Self-pay | Admitting: Cardiology

## 2018-10-10 DIAGNOSIS — S42201D Unspecified fracture of upper end of right humerus, subsequent encounter for fracture with routine healing: Secondary | ICD-10-CM | POA: Diagnosis not present

## 2018-10-10 DIAGNOSIS — Z9181 History of falling: Secondary | ICD-10-CM | POA: Diagnosis not present

## 2018-10-10 DIAGNOSIS — K219 Gastro-esophageal reflux disease without esophagitis: Secondary | ICD-10-CM | POA: Diagnosis not present

## 2018-10-10 DIAGNOSIS — M1990 Unspecified osteoarthritis, unspecified site: Secondary | ICD-10-CM | POA: Diagnosis not present

## 2018-10-10 DIAGNOSIS — F419 Anxiety disorder, unspecified: Secondary | ICD-10-CM | POA: Diagnosis not present

## 2018-10-10 DIAGNOSIS — I10 Essential (primary) hypertension: Secondary | ICD-10-CM | POA: Diagnosis not present

## 2018-10-11 DIAGNOSIS — S42295D Other nondisplaced fracture of upper end of left humerus, subsequent encounter for fracture with routine healing: Secondary | ICD-10-CM | POA: Diagnosis not present

## 2018-10-15 DIAGNOSIS — S42201D Unspecified fracture of upper end of right humerus, subsequent encounter for fracture with routine healing: Secondary | ICD-10-CM | POA: Diagnosis not present

## 2018-10-15 DIAGNOSIS — F419 Anxiety disorder, unspecified: Secondary | ICD-10-CM | POA: Diagnosis not present

## 2018-10-15 DIAGNOSIS — Z9181 History of falling: Secondary | ICD-10-CM | POA: Diagnosis not present

## 2018-10-15 DIAGNOSIS — K219 Gastro-esophageal reflux disease without esophagitis: Secondary | ICD-10-CM | POA: Diagnosis not present

## 2018-10-15 DIAGNOSIS — M1990 Unspecified osteoarthritis, unspecified site: Secondary | ICD-10-CM | POA: Diagnosis not present

## 2018-10-15 DIAGNOSIS — I10 Essential (primary) hypertension: Secondary | ICD-10-CM | POA: Diagnosis not present

## 2018-10-17 ENCOUNTER — Telehealth: Payer: Self-pay | Admitting: *Deleted

## 2018-10-17 NOTE — Telephone Encounter (Signed)
A message was left, re: follow up visit. 

## 2018-10-22 DIAGNOSIS — N3020 Other chronic cystitis without hematuria: Secondary | ICD-10-CM | POA: Diagnosis not present

## 2018-10-26 DIAGNOSIS — M1990 Unspecified osteoarthritis, unspecified site: Secondary | ICD-10-CM | POA: Diagnosis not present

## 2018-10-26 DIAGNOSIS — I10 Essential (primary) hypertension: Secondary | ICD-10-CM | POA: Diagnosis not present

## 2018-10-26 DIAGNOSIS — Z9181 History of falling: Secondary | ICD-10-CM | POA: Diagnosis not present

## 2018-10-26 DIAGNOSIS — S42201D Unspecified fracture of upper end of right humerus, subsequent encounter for fracture with routine healing: Secondary | ICD-10-CM | POA: Diagnosis not present

## 2018-10-26 DIAGNOSIS — K219 Gastro-esophageal reflux disease without esophagitis: Secondary | ICD-10-CM | POA: Diagnosis not present

## 2018-10-26 DIAGNOSIS — F419 Anxiety disorder, unspecified: Secondary | ICD-10-CM | POA: Diagnosis not present

## 2018-11-12 ENCOUNTER — Other Ambulatory Visit: Payer: Self-pay

## 2018-11-12 DIAGNOSIS — F419 Anxiety disorder, unspecified: Secondary | ICD-10-CM | POA: Diagnosis not present

## 2018-11-12 DIAGNOSIS — M1990 Unspecified osteoarthritis, unspecified site: Secondary | ICD-10-CM | POA: Diagnosis not present

## 2018-11-12 DIAGNOSIS — K219 Gastro-esophageal reflux disease without esophagitis: Secondary | ICD-10-CM | POA: Diagnosis not present

## 2018-11-12 DIAGNOSIS — Z9181 History of falling: Secondary | ICD-10-CM | POA: Diagnosis not present

## 2018-11-12 DIAGNOSIS — I10 Essential (primary) hypertension: Secondary | ICD-10-CM | POA: Diagnosis not present

## 2018-11-12 DIAGNOSIS — S42201D Unspecified fracture of upper end of right humerus, subsequent encounter for fracture with routine healing: Secondary | ICD-10-CM | POA: Diagnosis not present

## 2018-11-15 DIAGNOSIS — S42295D Other nondisplaced fracture of upper end of left humerus, subsequent encounter for fracture with routine healing: Secondary | ICD-10-CM | POA: Diagnosis not present

## 2018-11-21 DIAGNOSIS — Z9181 History of falling: Secondary | ICD-10-CM | POA: Diagnosis not present

## 2018-11-21 DIAGNOSIS — M1990 Unspecified osteoarthritis, unspecified site: Secondary | ICD-10-CM | POA: Diagnosis not present

## 2018-11-21 DIAGNOSIS — K219 Gastro-esophageal reflux disease without esophagitis: Secondary | ICD-10-CM | POA: Diagnosis not present

## 2018-11-21 DIAGNOSIS — I10 Essential (primary) hypertension: Secondary | ICD-10-CM | POA: Diagnosis not present

## 2018-11-21 DIAGNOSIS — F419 Anxiety disorder, unspecified: Secondary | ICD-10-CM | POA: Diagnosis not present

## 2018-11-21 DIAGNOSIS — M84421D Pathological fracture, right humerus, subsequent encounter for fracture with routine healing: Secondary | ICD-10-CM | POA: Diagnosis not present

## 2018-11-22 DIAGNOSIS — L03032 Cellulitis of left toe: Secondary | ICD-10-CM | POA: Diagnosis not present

## 2018-11-22 DIAGNOSIS — L600 Ingrowing nail: Secondary | ICD-10-CM | POA: Diagnosis not present

## 2018-11-26 DIAGNOSIS — K219 Gastro-esophageal reflux disease without esophagitis: Secondary | ICD-10-CM | POA: Diagnosis not present

## 2018-11-26 DIAGNOSIS — M84421D Pathological fracture, right humerus, subsequent encounter for fracture with routine healing: Secondary | ICD-10-CM | POA: Diagnosis not present

## 2018-11-26 DIAGNOSIS — M1990 Unspecified osteoarthritis, unspecified site: Secondary | ICD-10-CM | POA: Diagnosis not present

## 2018-11-26 DIAGNOSIS — F419 Anxiety disorder, unspecified: Secondary | ICD-10-CM | POA: Diagnosis not present

## 2018-11-26 DIAGNOSIS — I10 Essential (primary) hypertension: Secondary | ICD-10-CM | POA: Diagnosis not present

## 2018-11-26 DIAGNOSIS — Z9181 History of falling: Secondary | ICD-10-CM | POA: Diagnosis not present

## 2018-11-30 DIAGNOSIS — M25612 Stiffness of left shoulder, not elsewhere classified: Secondary | ICD-10-CM | POA: Diagnosis not present

## 2018-11-30 DIAGNOSIS — M6281 Muscle weakness (generalized): Secondary | ICD-10-CM | POA: Diagnosis not present

## 2018-11-30 DIAGNOSIS — M25512 Pain in left shoulder: Secondary | ICD-10-CM | POA: Diagnosis not present

## 2018-12-03 DIAGNOSIS — M25612 Stiffness of left shoulder, not elsewhere classified: Secondary | ICD-10-CM | POA: Diagnosis not present

## 2018-12-03 DIAGNOSIS — M25512 Pain in left shoulder: Secondary | ICD-10-CM | POA: Diagnosis not present

## 2018-12-03 DIAGNOSIS — M6281 Muscle weakness (generalized): Secondary | ICD-10-CM | POA: Diagnosis not present

## 2018-12-05 DIAGNOSIS — M6281 Muscle weakness (generalized): Secondary | ICD-10-CM | POA: Diagnosis not present

## 2018-12-05 DIAGNOSIS — M25512 Pain in left shoulder: Secondary | ICD-10-CM | POA: Diagnosis not present

## 2018-12-05 DIAGNOSIS — M25612 Stiffness of left shoulder, not elsewhere classified: Secondary | ICD-10-CM | POA: Diagnosis not present

## 2018-12-06 DIAGNOSIS — L600 Ingrowing nail: Secondary | ICD-10-CM | POA: Diagnosis not present

## 2018-12-10 DIAGNOSIS — M25512 Pain in left shoulder: Secondary | ICD-10-CM | POA: Diagnosis not present

## 2018-12-10 DIAGNOSIS — M25612 Stiffness of left shoulder, not elsewhere classified: Secondary | ICD-10-CM | POA: Diagnosis not present

## 2018-12-10 DIAGNOSIS — M6281 Muscle weakness (generalized): Secondary | ICD-10-CM | POA: Diagnosis not present

## 2018-12-12 DIAGNOSIS — M25612 Stiffness of left shoulder, not elsewhere classified: Secondary | ICD-10-CM | POA: Diagnosis not present

## 2018-12-12 DIAGNOSIS — M6281 Muscle weakness (generalized): Secondary | ICD-10-CM | POA: Diagnosis not present

## 2018-12-12 DIAGNOSIS — M25512 Pain in left shoulder: Secondary | ICD-10-CM | POA: Diagnosis not present

## 2018-12-18 DIAGNOSIS — M25512 Pain in left shoulder: Secondary | ICD-10-CM | POA: Diagnosis not present

## 2018-12-18 DIAGNOSIS — M6281 Muscle weakness (generalized): Secondary | ICD-10-CM | POA: Diagnosis not present

## 2018-12-18 DIAGNOSIS — M25612 Stiffness of left shoulder, not elsewhere classified: Secondary | ICD-10-CM | POA: Diagnosis not present

## 2019-01-01 DIAGNOSIS — E559 Vitamin D deficiency, unspecified: Secondary | ICD-10-CM | POA: Diagnosis not present

## 2019-01-01 DIAGNOSIS — I10 Essential (primary) hypertension: Secondary | ICD-10-CM | POA: Diagnosis not present

## 2019-01-08 DIAGNOSIS — I2510 Atherosclerotic heart disease of native coronary artery without angina pectoris: Secondary | ICD-10-CM | POA: Diagnosis not present

## 2019-01-08 DIAGNOSIS — Z0000 Encounter for general adult medical examination without abnormal findings: Secondary | ICD-10-CM | POA: Diagnosis not present

## 2019-01-08 DIAGNOSIS — Z23 Encounter for immunization: Secondary | ICD-10-CM | POA: Diagnosis not present

## 2019-01-08 DIAGNOSIS — I10 Essential (primary) hypertension: Secondary | ICD-10-CM | POA: Diagnosis not present

## 2019-01-08 DIAGNOSIS — E7800 Pure hypercholesterolemia, unspecified: Secondary | ICD-10-CM | POA: Diagnosis not present

## 2019-01-15 ENCOUNTER — Telehealth: Payer: Self-pay | Admitting: Cardiology

## 2019-01-15 NOTE — Telephone Encounter (Signed)
New message    *STAT* If patient is at the pharmacy, call can be transferred to refill team.   1. Which medications need to be refilled? (please list name of each medication and dose if known) metoprolol tartrate (LOPRESSOR) 50 MG tablet  2. Which pharmacy/location (including street and city if local pharmacy) is medication to be sent to?[facility] in [place], [place]   3. Do they need a 30 day or 90 day supply? [place]

## 2019-01-16 MED ORDER — METOPROLOL TARTRATE 50 MG PO TABS: 50.0000 mg | ORAL_TABLET | Freq: Two times a day (BID) | ORAL | 0 refills | Status: DC

## 2019-01-16 NOTE — Telephone Encounter (Signed)
Rx request sent to pharmacy. Pt scheduled for appt with Dr. Stanford Breed 10/26

## 2019-02-04 ENCOUNTER — Ambulatory Visit: Payer: PPO | Admitting: Cardiology

## 2019-02-26 ENCOUNTER — Other Ambulatory Visit: Payer: Self-pay | Admitting: Cardiology

## 2019-02-26 MED ORDER — ISOSORBIDE MONONITRATE ER 30 MG PO TB24: 15.0000 mg | ORAL_TABLET | Freq: Every day | ORAL | 1 refills | Status: DC

## 2019-02-26 NOTE — Telephone Encounter (Signed)
This is Dr. Crenshaw's pt 

## 2019-03-28 DIAGNOSIS — R3915 Urgency of urination: Secondary | ICD-10-CM | POA: Diagnosis not present

## 2019-03-28 DIAGNOSIS — N3020 Other chronic cystitis without hematuria: Secondary | ICD-10-CM | POA: Diagnosis not present

## 2019-04-14 ENCOUNTER — Other Ambulatory Visit: Payer: Self-pay | Admitting: Cardiology

## 2019-04-17 ENCOUNTER — Telehealth: Payer: Self-pay | Admitting: Cardiology

## 2019-04-17 NOTE — Telephone Encounter (Signed)
Left message, ok for son to come to appointment with patient. Also ask them to call back if they prefer video or telephone visit instead.

## 2019-04-17 NOTE — Telephone Encounter (Signed)
Patient's daughter in-law states that patient will need her son, Abrish Chughtai, to accompany her during her appointment due to age and memory loss. Please call.  [phone]

## 2019-04-18 NOTE — Progress Notes (Signed)
HPI: Fu CAD. She has a h/o severe HTN and hx of PCI to the pLAD in 4/09 with a Palmaz DES and Promus DES to [place] in 11/11. LHC 02/23/12 performed for chest pain and dyspnea: pLAD 20%, mLAD stent patent, dLAD 60%, oD1 70% (small), small CFX with one AV groove OM1 branch with mid diffuse 80%-no change from 2011, oRCA 40%, dRCA stent patent, EF 75% - medical Rx recommended. Nuclear study November 2017 showed ejection fraction 82% with no ischemia or infarction. VQ scan December 2017 lower probability. Echocardiogram December 2017 showed normal LV function, grade 1 diastolic dysfunction, elevated left ventricular filling pressures and mild tricuspid regurgitation. Lasix 20 mg daily added, but reduced to 10 mg daily as she was having incontinence with higher dose. Since last seen,she has minimal dyspnea on exertion.  No orthopnea, PND, pedal edema, chest pain or syncope.  Some fatigue.   Current Outpatient Medications  Medication Sig Dispense Refill  . amLODipine (NORVASC) 10 MG tablet     . aspirin 81 MG tablet Take 1 tablet (81 mg total) by mouth daily.    . Calcium Carbonate-Vitamin D (CALCIUM 600+D) 600-400 MG-UNIT per tablet Take 1 tablet by mouth daily.      . enalapril (VASOTEC) 10 MG tablet Take 1 tablet (10 mg total) by mouth daily. 90 tablet 3  . isosorbide mononitrate (IMDUR) 30 MG 24 hr tablet Take 0.5 tablets (15 mg total) by mouth daily. 90 tablet 1  . levothyroxine (SYNTHROID, LEVOTHROID) 100 MCG tablet Take 1 tablet by mouth daily.    . metoprolol tartrate (LOPRESSOR) 50 MG tablet TAKE 1 TABLET BY MOUTH 2 TIMES DAILY. PLEASE KEEP YOUR UPCOMING APPOINTMENT FOR REFILLS. 180 tablet 0  . Multiple Vitamin (MULTIVITAMIN) tablet Take 1 tablet by mouth daily.      . nitroGLYCERIN (NITROSTAT) 0.4 MG SL tablet Place 0.4 mg under the tongue every 5 (five) minutes as needed. For chest pain    . simvastatin (ZOCOR) 20 MG tablet Take 20 mg by mouth at bedtime.    . traZODone (DESYREL) 50 MG  tablet Take 1 tablet by mouth daily.    . vitamin B-12 (CYANOCOBALAMIN) 500 MCG tablet Take 500 mcg by mouth daily.     No current facility-administered medications for this visit.     Past Medical History:  Diagnosis Date  . CAD (coronary artery disease)    a. 07/2007 PCI/Promus DES to LAD;  b. 02/2010 PCI/Promus DES to dRCA.;  c. 12/2010 Lexi MV: EF 88%, no isch/infarct.;  d. 02/2012 Cath: LM nl, LAD 20p, patent mid stent, 60d, D1 sm 70ost, D2 sm, diff, LCX sm , OM1 diff 13m (no change), RCA 40ost, patent stent, PD/PL nl, EF 75% w/o rwma's.  . Carotid bruit   . Diverticulosis   . GERD (gastroesophageal reflux disease)   . Hemorrhoids   . History of echocardiogram    a. 06/2012 Echo: EF nl, mild LVH.  Marland Kitchen HLD (hyperlipidemia)   . HTN (hypertension)   . Hypothyroidism   . IBS (irritable bowel syndrome)     Past Surgical History:  Procedure Laterality Date  . BACK SURGERY    . CORONARY ANGIOPLASTY WITH STENT PLACEMENT    . hysterectomy - unknown type    . LEFT HEART CATHETERIZATION WITH CORONARY ANGIOGRAM N/A 02/13/2012   Procedure: LEFT HEART CATHETERIZATION WITH CORONARY ANGIOGRAM;  Surgeon: Josue Hector, MD;  Location: [facility] CATH LAB;  Service: Cardiovascular;  Laterality: N/A;  . RECTOCELE REPAIR  Social History   Socioeconomic History  . Marital status: Widowed    Spouse name: Not on file  . Number of children: 4  . Years of education: Not on file  . Highest education level: Not on file  Occupational History  . Occupation: retired  Tobacco Use  . Smoking status: Never Smoker  . Smokeless tobacco: Never Used  Substance and Sexual Activity  . Alcohol use: No  . Drug use: No  . Sexual activity: Not on file  Other Topics Concern  . Not on file  Social History Narrative   Lives in [place].   Social Determinants of Health   Financial Resource Strain:   . Difficulty of Paying Living Expenses: Not on file  Food Insecurity:   . Worried About Charity fundraiser in the  Last Year: Not on file  . Ran Out of Food in the Last Year: Not on file  Transportation Needs:   . Lack of Transportation (Medical): Not on file  . Lack of Transportation (Non-Medical): Not on file  Physical Activity:   . Days of Exercise per Week: Not on file  . Minutes of Exercise per Session: Not on file  Stress:   . Feeling of Stress : Not on file  Social Connections:   . Frequency of Communication with Friends and Family: Not on file  . Frequency of Social Gatherings with Friends and Family: Not on file  . Attends Religious Services: Not on file  . Active Member of Clubs or Organizations: Not on file  . Attends Archivist Meetings: Not on file  . Marital Status: Not on file  Intimate Partner Violence:   . Fear of Current or Ex-Partner: Not on file  . Emotionally Abused: Not on file  . Physically Abused: Not on file  . Sexually Abused: Not on file    Family History  Problem Relation Age of Onset  . Myasthenia gravis Mother   . Hip fracture Father   . Heart disease Unknown        several uncles with CAD    ROS: no fevers or chills, productive cough, hemoptysis, dysphasia, odynophagia, melena, hematochezia, dysuria, hematuria, rash, seizure activity, orthopnea, PND, pedal edema, claudication. Remaining systems are negative.  Physical Exam: Well-developed well-nourished in no acute distress.  Skin is warm and dry.  HEENT is normal.  Neck is supple.  Chest is clear to auscultation with normal expansion.  Cardiovascular exam is regular rate and rhythm.  Abdominal exam nontender or distended. No masses palpated. Extremities show no edema. neuro grossly intact  ECG-sinus rhythm with first-degree AV block, no ST changes.  Personally reviewed  A/P  1 coronary artery disease-patient denies chest pain.  Continue aspirin and statin.  2 hypertension-blood pressure is controlled today.  Continue present medications and follow.  3 hyperlipidemia-continue  statin.  4 chronic fatigue/dyspnea-patient had an extensive previous evaluation including VQ scan, nuclear study showed no ischemia and echocardiogram showing vigorous LV function.  Possibly secondary to age and deconditioning.  Kirk Ruths, MD

## 2019-04-22 ENCOUNTER — Other Ambulatory Visit: Payer: Self-pay

## 2019-04-22 ENCOUNTER — Encounter: Payer: Self-pay | Admitting: Cardiology

## 2019-04-22 ENCOUNTER — Ambulatory Visit (INDEPENDENT_AMBULATORY_CARE_PROVIDER_SITE_OTHER): Payer: PPO | Admitting: Cardiology

## 2019-04-22 VITALS — BP 114/56 | HR 62 | Ht 65.5 in | Wt 126.4 lb

## 2019-04-22 DIAGNOSIS — I2510 Atherosclerotic heart disease of native coronary artery without angina pectoris: Secondary | ICD-10-CM

## 2019-04-22 DIAGNOSIS — E7800 Pure hypercholesterolemia, unspecified: Secondary | ICD-10-CM

## 2019-04-22 DIAGNOSIS — I10 Essential (primary) hypertension: Secondary | ICD-10-CM

## 2019-04-22 MED ORDER — METOPROLOL TARTRATE 50 MG PO TABS
ORAL_TABLET | ORAL | 3 refills | Status: DC
Start: 2019-04-22 — End: 2020-02-18

## 2019-04-22 NOTE — Patient Instructions (Signed)
Medication Instructions:  NO CHANGE *If you need a refill on your cardiac medications before your next appointment, please call your pharmacy*  Lab Work: If you have labs (blood work) drawn today and your tests are completely normal, you will receive your results only by: . MyChart Message (if you have MyChart) OR . A paper copy in the mail If you have any lab test that is abnormal or we need to change your treatment, we will call you to review the results.  Follow-Up: At [facility], you and your health needs are our priority.  As part of our continuing mission to provide you with exceptional heart care, we have created designated Provider Care Teams.  These Care Teams include your primary Cardiologist (physician) and Advanced Practice Providers (APPs -  Physician Assistants and Nurse Practitioners) who all work together to provide you with the care you need, when you need it.  Your next appointment:   6 month(s)  The format for your next appointment:   Either In Person or Virtual  Provider:   You may see BRIAN CRENSHAW MD or one of the following Advanced Practice Providers on your designated Care Team:    Luke Kilroy, PA-C  Callie Goodrich, PA-C  Jesse Cleaver, FNP    

## 2019-05-09 ENCOUNTER — Ambulatory Visit: Payer: PPO

## 2019-05-09 DIAGNOSIS — I739 Peripheral vascular disease, unspecified: Secondary | ICD-10-CM | POA: Diagnosis not present

## 2019-05-09 DIAGNOSIS — L603 Nail dystrophy: Secondary | ICD-10-CM | POA: Diagnosis not present

## 2019-05-09 DIAGNOSIS — L84 Corns and callosities: Secondary | ICD-10-CM | POA: Diagnosis not present

## 2019-05-17 ENCOUNTER — Ambulatory Visit: Payer: PPO | Attending: Internal Medicine

## 2019-05-17 DIAGNOSIS — Z23 Encounter for immunization: Secondary | ICD-10-CM | POA: Insufficient documentation

## 2019-05-17 NOTE — Progress Notes (Signed)
   Covid-19 Vaccination Clinic  Name:  [name]    MRN: [id number] DOB: 1921/04/07  05/17/2019  [name] was observed post Covid-19 immunization for 15 minutes without incidence. She was provided with Vaccine Information Sheet and instruction to access the V-Safe system.   [name] was instructed to call 911 with any severe reactions post vaccine: Marland Kitchen Difficulty breathing  . Swelling of your face and throat  . A fast heartbeat  . A bad rash all over your body  . Dizziness and weakness    Immunizations Administered    Name Date Dose VIS Date Route   Pfizer COVID-19 Vaccine 05/17/2019  3:39 PM 0.3 mL 03/22/2019 Intramuscular   Manufacturer: [place]   Lot: [id number]   [place]: [id number]

## 2019-06-11 ENCOUNTER — Ambulatory Visit: Payer: PPO | Attending: Internal Medicine

## 2019-06-11 DIAGNOSIS — Z23 Encounter for immunization: Secondary | ICD-10-CM | POA: Insufficient documentation

## 2019-06-11 NOTE — Progress Notes (Signed)
   Covid-19 Vaccination Clinic  Name:  [name]    MRN: [id number] DOB: 1920/07/21  06/11/2019  [name] was observed post Covid-19 immunization for 15 minutes without incident. She was provided with Vaccine Information Sheet and instruction to access the V-Safe system.   [name] was instructed to call 911 with any severe reactions post vaccine: Marland Kitchen Difficulty breathing  . Swelling of face and throat  . A fast heartbeat  . A bad rash all over body  . Dizziness and weakness   Immunizations Administered    Name Date Dose VIS Date Route   Pfizer COVID-19 Vaccine 06/11/2019  3:27 PM 0.3 mL 03/22/2019 Intramuscular   Manufacturer: [place]   Lot: [id number]   [place]: [id number]

## 2019-07-02 DIAGNOSIS — E7800 Pure hypercholesterolemia, unspecified: Secondary | ICD-10-CM | POA: Diagnosis not present

## 2019-07-05 ENCOUNTER — Telehealth: Payer: Self-pay | Admitting: Cardiology

## 2019-07-05 NOTE — Telephone Encounter (Signed)
[name] called stating [name] received a call to confirm an appointment. I advised [name] does not have any upcoming appointments and she is not due for one until July. I checked [name]'s chart to make sure they were not calling in regards to something else and did not see any documentation to confirm this.

## 2019-07-09 DIAGNOSIS — I2510 Atherosclerotic heart disease of native coronary artery without angina pectoris: Secondary | ICD-10-CM | POA: Diagnosis not present

## 2019-07-09 DIAGNOSIS — E039 Hypothyroidism, unspecified: Secondary | ICD-10-CM | POA: Diagnosis not present

## 2019-07-09 DIAGNOSIS — I10 Essential (primary) hypertension: Secondary | ICD-10-CM | POA: Diagnosis not present

## 2019-07-09 DIAGNOSIS — E7800 Pure hypercholesterolemia, unspecified: Secondary | ICD-10-CM | POA: Diagnosis not present

## 2019-08-08 DIAGNOSIS — L603 Nail dystrophy: Secondary | ICD-10-CM | POA: Diagnosis not present

## 2019-08-08 DIAGNOSIS — L84 Corns and callosities: Secondary | ICD-10-CM | POA: Diagnosis not present

## 2019-08-08 DIAGNOSIS — I739 Peripheral vascular disease, unspecified: Secondary | ICD-10-CM | POA: Diagnosis not present

## 2019-10-16 DIAGNOSIS — L8941 Pressure ulcer of contiguous site of back, buttock and hip, stage 1: Secondary | ICD-10-CM | POA: Diagnosis not present

## 2019-10-16 DIAGNOSIS — L820 Inflamed seborrheic keratosis: Secondary | ICD-10-CM | POA: Diagnosis not present

## 2019-10-16 DIAGNOSIS — D485 Neoplasm of uncertain behavior of skin: Secondary | ICD-10-CM | POA: Diagnosis not present

## 2019-10-16 DIAGNOSIS — L821 Other seborrheic keratosis: Secondary | ICD-10-CM | POA: Diagnosis not present

## 2019-10-16 DIAGNOSIS — L570 Actinic keratosis: Secondary | ICD-10-CM | POA: Diagnosis not present

## 2019-11-07 DIAGNOSIS — L603 Nail dystrophy: Secondary | ICD-10-CM | POA: Diagnosis not present

## 2019-11-07 DIAGNOSIS — I739 Peripheral vascular disease, unspecified: Secondary | ICD-10-CM | POA: Diagnosis not present

## 2019-11-07 DIAGNOSIS — L84 Corns and callosities: Secondary | ICD-10-CM | POA: Diagnosis not present

## 2019-11-13 NOTE — Progress Notes (Signed)
HPI: Fu CAD. She has a h/o severe HTN and hx of PCI to the pLAD in 4/09 with a Palmaz DES and Promus DES to [place] in 11/11. LHC 02/23/12 performed for chest pain and dyspnea: pLAD 20%, mLAD stent patent, dLAD 60%, oD1 70% (small), small CFX with one AV groove OM1 branch with mid diffuse 80%-no change from 2011, oRCA 40%, dRCA stent patent, EF 75% - medical Rx recommended. Nuclear study November 2017 showed ejection fraction 82% with no ischemia or infarction. VQ scan December 2017 lower probability. Echocardiogram December 2017 showed normal LV function, grade 1 diastolic dysfunction, elevated left ventricular filling pressures and mild tricuspid regurgitation. Lasix 20 mg daily added, but reduced to 10 mg daily as she was having incontinence with higher dose. Since last seen,she denies exertional chest pain.  No syncope though occasional minimal dizziness.  She continues to have dyspnea on exertion.  Current Outpatient Medications  Medication Sig Dispense Refill  . amLODipine (NORVASC) 10 MG tablet     . aspirin 81 MG tablet Take 1 tablet (81 mg total) by mouth daily.    . Calcium Carbonate-Vitamin D (CALCIUM 600+D) 600-400 MG-UNIT per tablet Take 1 tablet by mouth daily.      . enalapril (VASOTEC) 10 MG tablet Take 1 tablet (10 mg total) by mouth daily. 90 tablet 3  . levothyroxine (SYNTHROID, LEVOTHROID) 100 MCG tablet Take 1 tablet by mouth daily.    . metoprolol tartrate (LOPRESSOR) 50 MG tablet TAKE 1 TABLET BY MOUTH 2 TIMES DAILY. 180 tablet 3  . Multiple Vitamin (MULTIVITAMIN) tablet Take 1 tablet by mouth daily.      Marland Kitchen MYRBETRIQ 50 MG TB24 tablet Take 50 mg by mouth daily.    . nitroGLYCERIN (NITROSTAT) 0.4 MG SL tablet Place 0.4 mg under the tongue every 5 (five) minutes as needed. For chest pain    . simvastatin (ZOCOR) 20 MG tablet Take 20 mg by mouth at bedtime.    . traZODone (DESYREL) 50 MG tablet Take 1 tablet by mouth daily.    . vitamin B-12 (CYANOCOBALAMIN) 500 MCG tablet  Take 500 mcg by mouth daily.    . isosorbide mononitrate (IMDUR) 30 MG 24 hr tablet Take 0.5 tablets (15 mg total) by mouth daily. 90 tablet 1   No current facility-administered medications for this visit.     Past Medical History:  Diagnosis Date  . CAD (coronary artery disease)    a. 07/2007 PCI/Promus DES to LAD;  b. 02/2010 PCI/Promus DES to dRCA.;  c. 12/2010 Lexi MV: EF 88%, no isch/infarct.;  d. 02/2012 Cath: LM nl, LAD 20p, patent mid stent, 60d, D1 sm 70ost, D2 sm, diff, LCX sm , OM1 diff 27m (no change), RCA 40ost, patent stent, PD/PL nl, EF 75% w/o rwma's.  . Carotid bruit   . Diverticulosis   . GERD (gastroesophageal reflux disease)   . Hemorrhoids   . History of echocardiogram    a. 06/2012 Echo: EF nl, mild LVH.  Marland Kitchen HLD (hyperlipidemia)   . HTN (hypertension)   . Hypothyroidism   . IBS (irritable bowel syndrome)     Past Surgical History:  Procedure Laterality Date  . BACK SURGERY    . CORONARY ANGIOPLASTY WITH STENT PLACEMENT    . hysterectomy - unknown type    . LEFT HEART CATHETERIZATION WITH CORONARY ANGIOGRAM N/A 02/13/2012   Procedure: LEFT HEART CATHETERIZATION WITH CORONARY ANGIOGRAM;  Surgeon: Josue Hector, MD;  Location[facility] CATH LAB;  Service: Cardiovascular;  Laterality: N/A;  . RECTOCELE REPAIR      Social History   Socioeconomic History  . Marital status: Widowed    Spouse name: Not on file  . Number of children: 4  . Years of education: Not on file  . Highest education level: Not on file  Occupational History  . Occupation: retired  Tobacco Use  . Smoking status: Never Smoker  . Smokeless tobacco: Never Used  Substance and Sexual Activity  . Alcohol use: No  . Drug use: No  . Sexual activity: Not on file  Other Topics Concern  . Not on file  Social History Narrative   Lives in [place].   Social Determinants of Health   Financial Resource Strain:   . Difficulty of Paying Living Expenses:   Food Insecurity:   . Worried About Ship broker in the Last Year:   . Arboriculturist in the Last Year:   Transportation Needs:   . Film/video editor (Medical):   Marland Kitchen Lack of Transportation (Non-Medical):   Physical Activity:   . Days of Exercise per Week:   . Minutes of Exercise per Session:   Stress:   . Feeling of Stress :   Social Connections:   . Frequency of Communication with Friends and Family:   . Frequency of Social Gatherings with Friends and Family:   . Attends Religious Services:   . Active Member of Clubs or Organizations:   . Attends Archivist Meetings:   Marland Kitchen Marital Status:   Intimate Partner Violence:   . Fear of Current or Ex-Partner:   . Emotionally Abused:   Marland Kitchen Physically Abused:   . Sexually Abused:     Family History  Problem Relation Age of Onset  . Myasthenia gravis Mother   . Hip fracture Father   . Heart disease Unknown        several uncles with CAD    ROS: no fevers or chills, productive cough, hemoptysis, dysphasia, odynophagia, melena, hematochezia, dysuria, hematuria, rash, seizure activity, orthopnea, PND, pedal edema, claudication. Remaining systems are negative.  Physical Exam: Well-developed well-nourished in no acute distress.  Skin is warm and dry.  HEENT is normal.  Neck is supple.  Chest is clear to auscultation with normal expansion.  Cardiovascular exam is regular rate and rhythm.  Abdominal exam nontender or distended. No masses palpated. Extremities show no edema. neuro grossly intact  A/P  1 coronary artery disease-patient denies chest pain.  Plan to continue medical therapy with aspirin and statin.  2 hypertension-blood pressure running low at home.  Decrease amlodipine to 5 mg daily and follow.  3 hyperlipidemia-continue statin.  4 chronic fatigue/dyspnea-as outlined previously patient has had an extensive evaluation including VQ scan, nuclear study and echocardiogram.  This is felt possibly secondary to age and deconditioning.  Would like to be  conservative if possible.  Kirk Ruths, MD

## 2019-11-18 ENCOUNTER — Ambulatory Visit: Payer: PPO | Admitting: Cardiology

## 2019-11-18 ENCOUNTER — Encounter: Payer: Self-pay | Admitting: Cardiology

## 2019-11-18 ENCOUNTER — Other Ambulatory Visit: Payer: Self-pay

## 2019-11-18 VITALS — BP 120/76 | HR 62 | Temp 97.0°F | Ht 66.0 in | Wt 122.6 lb

## 2019-11-18 DIAGNOSIS — I10 Essential (primary) hypertension: Secondary | ICD-10-CM | POA: Diagnosis not present

## 2019-11-18 DIAGNOSIS — I2510 Atherosclerotic heart disease of native coronary artery without angina pectoris: Secondary | ICD-10-CM | POA: Diagnosis not present

## 2019-11-18 DIAGNOSIS — E7800 Pure hypercholesterolemia, unspecified: Secondary | ICD-10-CM | POA: Diagnosis not present

## 2019-11-18 NOTE — Patient Instructions (Addendum)
Medication Instructions:   DECREASE AMLODIPINE TO 5 MG ONCE DAILY= 1/2 OF THE 10 MG TABLET ONCE DAILY  *If you need a refill on your cardiac medications before your next appointment, please call your pharmacy*   Lab Work: If you have labs (blood work) drawn today and your tests are completely normal, you will receive your results only by: Marland Kitchen MyChart Message (if you have MyChart) OR . A paper copy in the mail If you have any lab test that is abnormal or we need to change your treatment, we will call you to review the results   Follow-Up: At [facility], you and your health needs are our priority.  As part of our continuing mission to provide you with exceptional heart care, we have created designated Provider Care Teams.  These Care Teams include your primary Cardiologist (physician) and Advanced Practice Providers (APPs -  Physician Assistants and Nurse Practitioners) who all work together to provide you with the care you need, when you need it.  We recommend signing up for the patient portal called "MyChart".  Sign up information is provided on this After Visit Summary.  MyChart is used to connect with patients for Virtual Visits (Telemedicine).  Patients are able to view lab/test results, encounter notes, upcoming appointments, etc.  Non-urgent messages can be sent to your provider as well.   To learn more about what you can do with MyChart, go to [email or web address].    Your next appointment:   6 month(s)  The format for your next appointment:   In Person  Provider:   You may see Kirk Ruths MD or one of the following Advanced Practice Providers on your designated Care Team:    Kerin Ransom, PA-C  [place], [place]  Coletta Memos, [place]

## 2020-01-08 DIAGNOSIS — E559 Vitamin D deficiency, unspecified: Secondary | ICD-10-CM | POA: Diagnosis not present

## 2020-01-08 DIAGNOSIS — E039 Hypothyroidism, unspecified: Secondary | ICD-10-CM | POA: Diagnosis not present

## 2020-01-08 DIAGNOSIS — Z0000 Encounter for general adult medical examination without abnormal findings: Secondary | ICD-10-CM | POA: Diagnosis not present

## 2020-01-08 DIAGNOSIS — E7800 Pure hypercholesterolemia, unspecified: Secondary | ICD-10-CM | POA: Diagnosis not present

## 2020-01-08 DIAGNOSIS — I10 Essential (primary) hypertension: Secondary | ICD-10-CM | POA: Diagnosis not present

## 2020-01-15 DIAGNOSIS — E7800 Pure hypercholesterolemia, unspecified: Secondary | ICD-10-CM | POA: Diagnosis not present

## 2020-01-15 DIAGNOSIS — I2510 Atherosclerotic heart disease of native coronary artery without angina pectoris: Secondary | ICD-10-CM | POA: Diagnosis not present

## 2020-01-15 DIAGNOSIS — Z0000 Encounter for general adult medical examination without abnormal findings: Secondary | ICD-10-CM | POA: Diagnosis not present

## 2020-01-15 DIAGNOSIS — E039 Hypothyroidism, unspecified: Secondary | ICD-10-CM | POA: Diagnosis not present

## 2020-01-15 DIAGNOSIS — Z23 Encounter for immunization: Secondary | ICD-10-CM | POA: Diagnosis not present

## 2020-01-15 DIAGNOSIS — R413 Other amnesia: Secondary | ICD-10-CM | POA: Diagnosis not present

## 2020-02-10 ENCOUNTER — Emergency Department (INDEPENDENT_AMBULATORY_CARE_PROVIDER_SITE_OTHER)
Admission: EM | Admit: 2020-02-10 | Discharge: 2020-02-10 | Disposition: A | Discharge status: Home / Self Care | Payer: PPO | Source: Home / Self Care

## 2020-02-10 ENCOUNTER — Encounter: Payer: Self-pay | Admitting: Emergency Medicine

## 2020-02-10 ENCOUNTER — Emergency Department (HOSPITAL_COMMUNITY): Payer: PPO

## 2020-02-10 ENCOUNTER — Encounter (HOSPITAL_COMMUNITY): Payer: Self-pay

## 2020-02-10 ENCOUNTER — Inpatient Hospital Stay (HOSPITAL_COMMUNITY)
Admission: EM | Admit: 2020-02-10 | Discharge: 2020-02-18 | DRG: 175 | Disposition: A | Discharge status: Skilled Nursing Facility | Payer: PPO | Attending: Internal Medicine | Admitting: Internal Medicine

## 2020-02-10 ENCOUNTER — Other Ambulatory Visit: Payer: Self-pay

## 2020-02-10 DIAGNOSIS — Z515 Encounter for palliative care: Secondary | ICD-10-CM | POA: Diagnosis not present

## 2020-02-10 DIAGNOSIS — R911 Solitary pulmonary nodule: Secondary | ICD-10-CM | POA: Diagnosis present

## 2020-02-10 DIAGNOSIS — I130 Hypertensive heart and chronic kidney disease with heart failure and stage 1 through stage 4 chronic kidney disease, or unspecified chronic kidney disease: Secondary | ICD-10-CM | POA: Diagnosis present

## 2020-02-10 DIAGNOSIS — R000 Tachycardia, unspecified: Secondary | ICD-10-CM | POA: Diagnosis not present

## 2020-02-10 DIAGNOSIS — J9601 Acute respiratory failure with hypoxia: Secondary | ICD-10-CM | POA: Diagnosis present

## 2020-02-10 DIAGNOSIS — K219 Gastro-esophageal reflux disease without esophagitis: Secondary | ICD-10-CM | POA: Diagnosis present

## 2020-02-10 DIAGNOSIS — R140 Abdominal distension (gaseous): Secondary | ICD-10-CM | POA: Diagnosis not present

## 2020-02-10 DIAGNOSIS — I2694 Multiple subsegmental pulmonary emboli without acute cor pulmonale: Secondary | ICD-10-CM

## 2020-02-10 DIAGNOSIS — I4891 Unspecified atrial fibrillation: Secondary | ICD-10-CM | POA: Diagnosis present

## 2020-02-10 DIAGNOSIS — Z79890 Hormone replacement therapy (postmenopausal): Secondary | ICD-10-CM | POA: Diagnosis not present

## 2020-02-10 DIAGNOSIS — R42 Dizziness and giddiness: Secondary | ICD-10-CM | POA: Diagnosis not present

## 2020-02-10 DIAGNOSIS — E038 Other specified hypothyroidism: Secondary | ICD-10-CM | POA: Diagnosis not present

## 2020-02-10 DIAGNOSIS — J9811 Atelectasis: Secondary | ICD-10-CM | POA: Diagnosis present

## 2020-02-10 DIAGNOSIS — Z955 Presence of coronary angioplasty implant and graft: Secondary | ICD-10-CM

## 2020-02-10 DIAGNOSIS — N1831 Chronic kidney disease, stage 3a: Secondary | ICD-10-CM | POA: Diagnosis present

## 2020-02-10 DIAGNOSIS — Z20822 Contact with and (suspected) exposure to covid-19: Secondary | ICD-10-CM | POA: Diagnosis present

## 2020-02-10 DIAGNOSIS — Z7982 Long term (current) use of aspirin: Secondary | ICD-10-CM | POA: Diagnosis not present

## 2020-02-10 DIAGNOSIS — Z87440 Personal history of urinary (tract) infections: Secondary | ICD-10-CM

## 2020-02-10 DIAGNOSIS — R0600 Dyspnea, unspecified: Secondary | ICD-10-CM

## 2020-02-10 DIAGNOSIS — I480 Paroxysmal atrial fibrillation: Secondary | ICD-10-CM | POA: Diagnosis present

## 2020-02-10 DIAGNOSIS — J90 Pleural effusion, not elsewhere classified: Secondary | ICD-10-CM | POA: Diagnosis not present

## 2020-02-10 DIAGNOSIS — R498 Other voice and resonance disorders: Secondary | ICD-10-CM | POA: Diagnosis not present

## 2020-02-10 DIAGNOSIS — Z66 Do not resuscitate: Secondary | ICD-10-CM | POA: Diagnosis present

## 2020-02-10 DIAGNOSIS — M6281 Muscle weakness (generalized): Secondary | ICD-10-CM | POA: Diagnosis not present

## 2020-02-10 DIAGNOSIS — R319 Hematuria, unspecified: Secondary | ICD-10-CM | POA: Diagnosis present

## 2020-02-10 DIAGNOSIS — E785 Hyperlipidemia, unspecified: Secondary | ICD-10-CM | POA: Diagnosis present

## 2020-02-10 DIAGNOSIS — Z79899 Other long term (current) drug therapy: Secondary | ICD-10-CM | POA: Diagnosis not present

## 2020-02-10 DIAGNOSIS — R0602 Shortness of breath: Secondary | ICD-10-CM

## 2020-02-10 DIAGNOSIS — M545 Low back pain, unspecified: Secondary | ICD-10-CM | POA: Diagnosis not present

## 2020-02-10 DIAGNOSIS — I5032 Chronic diastolic (congestive) heart failure: Secondary | ICD-10-CM | POA: Diagnosis not present

## 2020-02-10 DIAGNOSIS — J811 Chronic pulmonary edema: Secondary | ICD-10-CM | POA: Diagnosis not present

## 2020-02-10 DIAGNOSIS — R2689 Other abnormalities of gait and mobility: Secondary | ICD-10-CM | POA: Diagnosis not present

## 2020-02-10 DIAGNOSIS — Z79891 Long term (current) use of opiate analgesic: Secondary | ICD-10-CM | POA: Diagnosis not present

## 2020-02-10 DIAGNOSIS — R41841 Cognitive communication deficit: Secondary | ICD-10-CM | POA: Diagnosis not present

## 2020-02-10 DIAGNOSIS — R54 Age-related physical debility: Secondary | ICD-10-CM | POA: Diagnosis present

## 2020-02-10 DIAGNOSIS — I2699 Other pulmonary embolism without acute cor pulmonale: Principal | ICD-10-CM | POA: Diagnosis present

## 2020-02-10 DIAGNOSIS — K589 Irritable bowel syndrome without diarrhea: Secondary | ICD-10-CM | POA: Diagnosis present

## 2020-02-10 DIAGNOSIS — D539 Nutritional anemia, unspecified: Secondary | ICD-10-CM | POA: Diagnosis present

## 2020-02-10 DIAGNOSIS — E039 Hypothyroidism, unspecified: Secondary | ICD-10-CM | POA: Diagnosis present

## 2020-02-10 DIAGNOSIS — E860 Dehydration: Secondary | ICD-10-CM

## 2020-02-10 DIAGNOSIS — I517 Cardiomegaly: Secondary | ICD-10-CM | POA: Diagnosis not present

## 2020-02-10 DIAGNOSIS — I10 Essential (primary) hypertension: Secondary | ICD-10-CM

## 2020-02-10 DIAGNOSIS — R1312 Dysphagia, oropharyngeal phase: Secondary | ICD-10-CM | POA: Diagnosis not present

## 2020-02-10 DIAGNOSIS — I5033 Acute on chronic diastolic (congestive) heart failure: Secondary | ICD-10-CM | POA: Diagnosis present

## 2020-02-10 DIAGNOSIS — N3001 Acute cystitis with hematuria: Secondary | ICD-10-CM

## 2020-02-10 DIAGNOSIS — I2601 Septic pulmonary embolism with acute cor pulmonale: Secondary | ICD-10-CM | POA: Diagnosis not present

## 2020-02-10 DIAGNOSIS — R531 Weakness: Secondary | ICD-10-CM | POA: Diagnosis not present

## 2020-02-10 DIAGNOSIS — Z7189 Other specified counseling: Secondary | ICD-10-CM | POA: Diagnosis not present

## 2020-02-10 DIAGNOSIS — I2510 Atherosclerotic heart disease of native coronary artery without angina pectoris: Secondary | ICD-10-CM | POA: Diagnosis present

## 2020-02-10 DIAGNOSIS — R809 Proteinuria, unspecified: Secondary | ICD-10-CM

## 2020-02-10 DIAGNOSIS — R079 Chest pain, unspecified: Secondary | ICD-10-CM | POA: Diagnosis not present

## 2020-02-10 LAB — URINALYSIS, ROUTINE W REFLEX MICROSCOPIC
Bilirubin Urine: NEGATIVE
Glucose, UA: NEGATIVE mg/dL
Hgb urine dipstick: NEGATIVE
Ketones, ur: NEGATIVE mg/dL
Leukocytes,Ua: NEGATIVE
Nitrite: NEGATIVE
Protein, ur: 30 mg/dL — AB
Specific Gravity, Urine: 1.025 (ref 1.005–1.030)
pH: 5 (ref 5.0–8.0)

## 2020-02-10 LAB — COMPREHENSIVE METABOLIC PANEL
ALT: 13 U/L (ref 0–44)
AST: 19 U/L (ref 15–41)
Albumin: 3.9 g/dL (ref 3.5–5.0)
Alkaline Phosphatase: 64 U/L (ref 38–126)
Anion gap: 12 (ref 5–15)
BUN: 18 mg/dL (ref 8–23)
CO2: 26 mmol/L (ref 22–32)
Calcium: 9.3 mg/dL (ref 8.9–10.3)
Chloride: 103 mmol/L (ref 98–111)
Creatinine, Ser: 0.97 mg/dL (ref 0.44–1.00)
GFR, Estimated: 53 mL/min — ABNORMAL LOW (ref >60)
Glucose, Bld: 120 mg/dL — ABNORMAL HIGH (ref 70–99)
Potassium: 4.2 mmol/L (ref 3.5–5.1)
Sodium: 141 mmol/L (ref 135–145)
Total Bilirubin: 1 mg/dL (ref 0.3–1.2)
Total Protein: 7.7 g/dL (ref 6.5–8.1)

## 2020-02-10 LAB — POCT URINALYSIS DIP (MANUAL ENTRY)
Bilirubin, UA: NEGATIVE
Glucose, UA: NEGATIVE mg/dL
Ketones, POC UA: NEGATIVE mg/dL
Nitrite, UA: NEGATIVE
Protein Ur, POC: >=300 mg/dL — AB
Spec Grav, UA: >=1.03 — AB (ref 1.010–1.025)
Urobilinogen, UA: 1 E.U./dL
pH, UA: 5.5 (ref 5.0–8.0)

## 2020-02-10 LAB — BRAIN NATRIURETIC PEPTIDE: B Natriuretic Peptide: 571.7 pg/mL — ABNORMAL HIGH (ref 0.0–100.0)

## 2020-02-10 LAB — CBC WITH DIFFERENTIAL/PLATELET
Abs Immature Granulocytes: 0.07 10*3/uL (ref 0.00–0.07)
Basophils Absolute: 0 10*3/uL (ref 0.0–0.1)
Basophils Relative: 0 %
Eosinophils Absolute: 0.1 10*3/uL (ref 0.0–0.5)
Eosinophils Relative: 1 %
HCT: 36.9 % (ref 36.0–46.0)
Hemoglobin: 11.7 g/dL — ABNORMAL LOW (ref 12.0–15.0)
Immature Granulocytes: 1 %
Lymphocytes Relative: 20 %
Lymphs Abs: 2.1 10*3/uL (ref 0.7–4.0)
MCH: 32.8 pg (ref 26.0–34.0)
MCHC: 31.7 g/dL (ref 30.0–36.0)
MCV: 103.4 fL — ABNORMAL HIGH (ref 80.0–100.0)
Monocytes Absolute: 1.3 10*3/uL — ABNORMAL HIGH (ref 0.1–1.0)
Monocytes Relative: 13 %
Neutro Abs: 6.9 10*3/uL (ref 1.7–7.7)
Neutrophils Relative %: 65 %
Platelets: 182 10*3/uL (ref 150–400)
RBC: 3.57 MIL/uL — ABNORMAL LOW (ref 3.87–5.11)
RDW: 12.5 % (ref 11.5–15.5)
WBC: 10.5 10*3/uL (ref 4.0–10.5)
nRBC: 0 % (ref 0.0–0.2)

## 2020-02-10 LAB — RESPIRATORY PANEL BY RT PCR (FLU A&B, COVID)
Influenza A by PCR: NEGATIVE
Influenza B by PCR: NEGATIVE
SARS Coronavirus 2 by RT PCR: NEGATIVE

## 2020-02-10 LAB — TROPONIN I (HIGH SENSITIVITY): Troponin I (High Sensitivity): 8 ng/L (ref <18)

## 2020-02-10 MED ORDER — ACETAMINOPHEN 325 MG PO TABS
650.0000 mg | ORAL_TABLET | Freq: Four times a day (QID) | ORAL | Status: DC | PRN
  Administered 2020-02-14 – 2020-02-15 (×3): 650 mg via ORAL
  Filled 2020-02-10 (×3): qty 2

## 2020-02-10 MED ORDER — CEPHALEXIN 500 MG PO CAPS: 500.0000 mg | ORAL_CAPSULE | Freq: Two times a day (BID) | ORAL | 0 refills | Status: DC

## 2020-02-10 MED ORDER — IOHEXOL 350 MG/ML SOLN
100.0000 mL | Freq: Once | INTRAVENOUS | Status: AC | PRN
  Administered 2020-02-10: 100 mL via INTRAVENOUS

## 2020-02-10 MED ORDER — SODIUM CHLORIDE 0.9% FLUSH: 3.0000 mL | INTRAVENOUS | Status: DC | PRN

## 2020-02-10 MED ORDER — SODIUM CHLORIDE 0.9 % IV BOLUS
500.0000 mL | Freq: Once | INTRAVENOUS | Status: AC
  Administered 2020-02-10: 500 mL via INTRAVENOUS

## 2020-02-10 MED ORDER — LEVOTHYROXINE SODIUM 100 MCG PO TABS
100.0000 ug | ORAL_TABLET | Freq: Every day | ORAL | Status: DC
  Administered 2020-02-11 – 2020-02-18 (×8): 100 ug via ORAL
  Filled 2020-02-10 (×8): qty 1

## 2020-02-10 MED ORDER — ACETAMINOPHEN 650 MG RE SUPP: 650.0000 mg | Freq: Four times a day (QID) | RECTAL | Status: DC | PRN

## 2020-02-10 MED ORDER — TRIMETHOPRIM 100 MG PO TABS
100.0000 mg | ORAL_TABLET | Freq: Every day | ORAL | Status: DC
  Administered 2020-02-11 – 2020-02-18 (×8): 100 mg via ORAL
  Filled 2020-02-10 (×8): qty 1

## 2020-02-10 MED ORDER — SODIUM CHLORIDE 0.9% FLUSH
3.0000 mL | Freq: Two times a day (BID) | INTRAVENOUS | Status: DC
  Administered 2020-02-14 – 2020-02-18 (×4): 3 mL via INTRAVENOUS

## 2020-02-10 MED ORDER — TRAZODONE HCL 50 MG PO TABS
50.0000 mg | ORAL_TABLET | Freq: Every day | ORAL | Status: DC
  Administered 2020-02-10 – 2020-02-17 (×7): 50 mg via ORAL
  Filled 2020-02-10 (×8): qty 1

## 2020-02-10 MED ORDER — HEPARIN (PORCINE) 25000 UT/250ML-% IV SOLN
850.0000 [IU]/h | INTRAVENOUS | Status: AC
  Administered 2020-02-10 – 2020-02-12 (×2): 800 [IU]/h via INTRAVENOUS
  Filled 2020-02-10 (×2): qty 250

## 2020-02-10 MED ORDER — SIMVASTATIN 20 MG PO TABS
20.0000 mg | ORAL_TABLET | Freq: Every day | ORAL | Status: DC
  Administered 2020-02-10 – 2020-02-14 (×5): 20 mg via ORAL
  Filled 2020-02-10 (×5): qty 1

## 2020-02-10 MED ORDER — ONDANSETRON HCL 4 MG/2ML IJ SOLN
4.0000 mg | Freq: Four times a day (QID) | INTRAMUSCULAR | Status: DC | PRN
  Administered 2020-02-10: 4 mg via INTRAVENOUS
  Filled 2020-02-10: qty 2

## 2020-02-10 MED ORDER — HEPARIN BOLUS VIA INFUSION
1600.0000 [IU] | Freq: Once | INTRAVENOUS | Status: AC
  Administered 2020-02-10: 1600 [IU] via INTRAVENOUS
  Filled 2020-02-10: qty 1600

## 2020-02-10 MED ORDER — POLYETHYLENE GLYCOL 3350 17 G PO PACK: 17.0000 g | PACK | Freq: Every day | ORAL | Status: DC | PRN

## 2020-02-10 MED ORDER — MIRABEGRON ER 25 MG PO TB24
50.0000 mg | ORAL_TABLET | Freq: Every evening | ORAL | Status: DC
  Administered 2020-02-11 – 2020-02-17 (×7): 50 mg via ORAL
  Filled 2020-02-10 (×8): qty 2

## 2020-02-10 MED ORDER — ONDANSETRON HCL 4 MG PO TABS: 4.0000 mg | ORAL_TABLET | Freq: Four times a day (QID) | ORAL | Status: DC | PRN

## 2020-02-10 MED ORDER — ISOSORBIDE MONONITRATE ER 30 MG PO TB24
15.0000 mg | ORAL_TABLET | Freq: Every day | ORAL | Status: DC
  Administered 2020-02-11: 15 mg via ORAL
  Filled 2020-02-10 (×2): qty 1

## 2020-02-10 MED ORDER — SODIUM CHLORIDE 0.9% FLUSH
3.0000 mL | Freq: Two times a day (BID) | INTRAVENOUS | Status: DC
  Administered 2020-02-10 – 2020-02-18 (×9): 3 mL via INTRAVENOUS

## 2020-02-10 MED ORDER — SODIUM CHLORIDE 0.9 % IV SOLN: 250.0000 mL | INTRAVENOUS | Status: DC | PRN

## 2020-02-10 NOTE — ED Provider Notes (Signed)
[facility] DEPT Provider Note   CSN: [id number] Arrival date & time: 02/10/20  1658     History Chief Complaint  Patient presents with  . Weakness  . Shortness of Breath  . Hematuria    [name] is a 86 y.o. female history of CAD status post stents, reflux, hypertension, hyperlipidemia here presenting with shortness of breath, weakness and hematuria. Patient lives at home with family.  Patient has been having progressively worsening shortness of breath for the last 2 to 3 days.  Patient appears very short of breath with minimal exertion.  Patient also has some generalized weakness as well.  Patient went to urgent care and was noted to have some blood in her urine but did not urinate very much.  Apparently patient has not been eating and drinking much.  Patient was sent here for further evaluation.  Patient is not on blood thinners right now and has no history of A. fib.  The history is provided by the patient and a relative.       Past Medical History:  Diagnosis Date  . CAD (coronary artery disease)    a. 07/2007 PCI/Promus DES to LAD;  b. 02/2010 PCI/Promus DES to dRCA.;  c. 12/2010 Lexi MV: EF 88%, no isch/infarct.;  d. 02/2012 Cath: LM nl, LAD 20p, patent mid stent, 60d, D1 sm 70ost, D2 sm, diff, LCX sm , OM1 diff 72m (no change), RCA 40ost, patent stent, PD/PL nl, EF 75% w/o rwma's.  . Carotid bruit   . Diverticulosis   . GERD (gastroesophageal reflux disease)   . Hemorrhoids   . History of echocardiogram    a. 06/2012 Echo: EF nl, mild LVH.  Marland Kitchen HLD (hyperlipidemia)   . HTN (hypertension)   . Hypothyroidism   . IBS (irritable bowel syndrome)     Patient Active Problem List   Diagnosis Date Noted  . Acute pulmonary embolism ([place]) 02/10/2020  . Chronic diastolic CHF (congestive heart failure) ([place]) 02/10/2020  . New onset atrial fibrillation ([place]) 02/10/2020  . History of echocardiogram   . GERD (gastroesophageal reflux disease)   .  Diverticulosis   . Carotid bruit   . IBS (irritable bowel syndrome)   . Hypothyroidism   . HTN (hypertension)   . HLD (hyperlipidemia)   . CAD (coronary artery disease)   . Atypical chest pain 06/14/2012  . Chest pain 02/12/2012  . Fatigue 04/22/2011  . CAROTID BRUIT 03/19/2009  . HYPOTHYROIDISM 08/29/2008  . HEMORRHOIDS 08/29/2008  . GERD 08/29/2008  . DIVERTICULOSIS, COLON 08/29/2008  . HYPERLIPIDEMIA 07/04/2008  . HYPERTENSION, BENIGN 07/04/2008  . CAD, NATIVE VESSEL 07/04/2008  . IRRITABLE BOWEL SYNDROME 07/04/2008    Past Surgical History:  Procedure Laterality Date  . BACK SURGERY    . CORONARY ANGIOPLASTY WITH STENT PLACEMENT    . hysterectomy - unknown type    . LEFT HEART CATHETERIZATION WITH CORONARY ANGIOGRAM N/A 02/13/2012   Procedure: LEFT HEART CATHETERIZATION WITH CORONARY ANGIOGRAM;  Surgeon: Josue Hector, MD;  Location: [facility] CATH LAB;  Service: Cardiovascular;  Laterality: N/A;  . RECTOCELE REPAIR       OB History   No obstetric history on file.     Family History  Problem Relation Age of Onset  . Myasthenia gravis Mother   . Hip fracture Father   . Heart disease Other        several uncles with CAD    Social History   Tobacco Use  . Smoking status:  Never Smoker  . Smokeless tobacco: Never Used  Vaping Use  . Vaping Use: Never used  Substance Use Topics  . Alcohol use: No  . Drug use: No    Home Medications Prior to Admission medications   Medication Sig Start Date End Date Taking? Authorizing Provider  amLODipine (NORVASC) 10 MG tablet  08/19/13   [provider]  aspirin 81 MG tablet Take 1 tablet (81 mg total) by mouth daily. 12/03/10   Bensimhon, Shaune Pascal, MD  Calcium Carbonate-Vitamin D (CALCIUM 600+D) 600-400 MG-UNIT per tablet Take 1 tablet by mouth daily.      [provider]  cephALEXin (KEFLEX) 500 MG capsule Take 1 capsule (500 mg total) by mouth 2 (two) times daily for 7 days. 02/10/20 02/17/20  Noe Gens,  PA-C  enalapril (VASOTEC) 10 MG tablet Take 1 tablet (10 mg total) by mouth daily. 07/17/13   Leonard Schwartz, MD  isosorbide mononitrate (IMDUR) 30 MG 24 hr tablet Take 0.5 tablets (15 mg total) by mouth daily. 02/26/19 05/27/19  Lelon Perla, MD  levothyroxine (SYNTHROID, LEVOTHROID) 100 MCG tablet Take 1 tablet by mouth daily. 12/13/15   [provider]  metoprolol tartrate (LOPRESSOR) 50 MG tablet TAKE 1 TABLET BY MOUTH 2 TIMES DAILY. 04/22/19   Lelon Perla, MD  Multiple Vitamin (MULTIVITAMIN) tablet Take 1 tablet by mouth daily.      [provider]  MYRBETRIQ 50 MG TB24 tablet Take 50 mg by mouth daily. 10/31/19   [provider]  nitroGLYCERIN (NITROSTAT) 0.4 MG SL tablet Place 0.4 mg under the tongue every 5 (five) minutes as needed. For chest pain    [provider]  simvastatin (ZOCOR) 20 MG tablet Take 20 mg by mouth at bedtime.    [provider]  traZODone (DESYREL) 50 MG tablet Take 1 tablet by mouth daily. 12/15/15   [provider]  vitamin B-12 (CYANOCOBALAMIN) 500 MCG tablet Take 500 mcg by mouth daily.    [provider]    Allergies    Patient has no known allergies.  Review of Systems   Review of Systems  Respiratory: Positive for shortness of breath.   Genitourinary: Positive for hematuria.  Neurological: Positive for weakness.  All other systems reviewed and are negative.   Physical Exam Updated Vital Signs BP 123/85   Pulse (!) 115   Temp 97.8 F (36.6 C) (Oral)   Resp 19   Ht 5\' 5"  (1.651 m)   Wt 55.3 kg   SpO2 97%   BMI 20.30 kg/m   Physical Exam Vitals and nursing note reviewed.  Constitutional:      Comments: Chronically ill.  HENT:     Head: Normocephalic.     Mouth/Throat:     Mouth: Mucous membranes are moist.  Eyes:     Pupils: Pupils are equal, round, and reactive to light.  Cardiovascular:     Rate and Rhythm: Tachycardia present. Rhythm irregular.  Pulmonary:      Effort: Tachypnea present.     Breath sounds: Normal breath sounds.  Abdominal:     General: Bowel sounds are normal.     Palpations: Abdomen is soft.  Musculoskeletal:        General: Normal range of motion.     Cervical back: Normal range of motion and neck supple.  Skin:    General: Skin is warm.     Capillary Refill: Capillary refill takes less than 2 seconds.  Neurological:  General: No focal deficit present.     Comments: Slightly confused but moving all extremities.  No focal neuro deficit  Psychiatric:        Mood and Affect: Mood normal.        Behavior: Behavior normal.     ED Results / Procedures / Treatments   Labs (all labs ordered are listed, but only abnormal results are displayed) Labs Reviewed  CBC WITH DIFFERENTIAL/PLATELET - Abnormal; Notable for the following components:      Result Value   RBC 3.57 (*)    Hemoglobin 11.7 (*)    MCV 103.4 (*)    Monocytes Absolute 1.3 (*)    All other components within normal limits  COMPREHENSIVE METABOLIC PANEL - Abnormal; Notable for the following components:   Glucose, Bld 120 (*)    GFR, Estimated 53 (*)    All other components within normal limits  BRAIN NATRIURETIC PEPTIDE - Abnormal; Notable for the following components:   B Natriuretic Peptide 571.7 (*)    All other components within normal limits  URINALYSIS, ROUTINE W REFLEX MICROSCOPIC - Abnormal; Notable for the following components:   Color, Urine AMBER (*)    Protein, ur 30 (*)    Bacteria, UA RARE (*)    All other components within normal limits  RESPIRATORY PANEL BY RT PCR (FLU A&B, COVID)  URINE CULTURE  APTT  PROTIME-INR  CBC  TROPONIN I (HIGH SENSITIVITY)    EKG EKG Interpretation  Date/Time:  Monday February 10 2020 17:18:26 EDT Ventricular Rate:  112 PR Interval:    QRS Duration: 67 QT Interval:  349 QTC Calculation: 477 R Axis:   69 Text Interpretation: Atrial fibrillation Minimal ST depression, diffuse leads afib new since  previous Reconfirmed by Wandra Arthurs [phone]) on 02/10/2020 7:25:11 PM   Radiology CT Head Wo Contrast  Result Date: 02/10/2020 CLINICAL DATA:  Dizziness.  Mental status change. EXAM: CT HEAD WITHOUT CONTRAST TECHNIQUE: Contiguous axial images were obtained from the base of the skull through the vertex without intravenous contrast. COMPARISON:  None. FINDINGS: The study is mildly motion degraded. Brain: No acute large territory infarct, intracranial hemorrhage, mass, midline shift, or extra-axial fluid collection is identified. Lacunar infarcts in the left basal ganglia and left thalamus are likely chronic. Hypodensities in the cerebral white matter bilaterally are nonspecific but compatible with moderate chronic small vessel ischemic disease. A small age indeterminate cortical infarct is questioned in the left parietal lobe although this is in a region of motion and may be artifactual. There is mild lateral and third ventriculomegaly which may reflect central predominant cerebral atrophy with hydrocephalus considered less likely. Vascular: Calcified atherosclerosis at the skull base. No hyperdense vessel. Skull: No fracture or suspicious osseous lesion. Sinuses/Orbits: Visualized paranasal sinuses and mastoid air cells are clear. Bilateral cataract extraction. Other: Likely iatrogenic venous gas in the right frontotemporal scalp. IMPRESSION: 1. No evidence of acute large territory infarct or intracranial hemorrhage. 2. Small age indeterminate left parietal cortical infarct versus artifact. 3. Moderate chronic small vessel ischemic disease. Electronically Signed   By: Logan Bores M.D.   On: 02/10/2020 21:32   CT Angio Chest PE W and/or Wo Contrast  Result Date: 02/10/2020 CLINICAL DATA:  86 year old with shortness of breath. New onset atrial fibrillation. Shortness of breath for 3 days. Low back pain. Weakness. Nausea. EXAM: CT ANGIOGRAPHY CHEST WITH CONTRAST TECHNIQUE: Multidetector CT imaging of the chest  was performed using the standard protocol during bolus administration of intravenous contrast.  Multiplanar CT image reconstructions and MIPs were obtained to evaluate the vascular anatomy. Performed in conjunction with CT of the abdomen and pelvis, reported separately. CONTRAST:  188mL OMNIPAQUE IOHEXOL 350 MG/ML SOLN COMPARISON:  No recent exams.  Chest CT 02/14/2012. FINDINGS: Cardiovascular: Positive for acute pulmonary embolus with filling defect in the segmental branches of the left lower lobe, series 9 image 69, and possibly a few subsegmental branches in the right lower lobe, series 9 image 71. Thromboembolic burden is small. Probable contrast mixing in the left atrial appendage. Mild right heart dilatation as well as biatrial enlargement. Contrast refluxes into the hepatic veins and IVC. There are coronary artery calcifications. Dense aortic atherosclerosis. No aortic aneurysm. Mediastinum/Nodes: No mediastinal or hilar adenopathy. No esophageal wall thickening. No visualized thyroid nodule. Lungs/Pleura: Mild biapical pleuroparenchymal scarring. Breathing motion artifact limits detailed parenchymal assessment. Small bilateral pleural effusions with minimal adjacent atelectasis. Subsegmental opacities in the periphery of the right lower and middle lobe favor atelectasis. There is an 8 mm ground-glass nodule at the right lung apex, series 7, image 29. No evidence of tracheal or bronchial lesions, motion partially obscures detailed assessment. No septal thickening or convincing pulmonary edema Upper Abdomen: Assessed on concurrent abdominal CT. Contrast refluxes into the hepatic veins and IVC. Musculoskeletal: There are no acute or suspicious osseous abnormalities. Review of the MIP images confirms the above findings. IMPRESSION: 1. Positive for acute pulmonary embolus in the segmental branches of the left lower lobe and possibly a few subsegmental branches in the right lower lobe. Thromboembolic burden is  small. 2. Mild right heart dilatation as well as biatrial enlargement. Contrast refluxes into the hepatic veins and IVC, consistent with elevated right heart pressures. 3. Small bilateral pleural effusions with minimal adjacent atelectasis. 4. Right upper lobe 8 mm ground-glass nodule. Initial follow-up with CT at 6-12 months is recommended to confirm persistence. If persistent, repeat CT is recommended every 2 years until 5 years of stability has been established, giving consideration for patient's advanced age. This recommendation follows the consensus statement: Guidelines for Management of Incidental Pulmonary Nodules Detected on CT Images: From the Fleischner Society 2017; Radiology 2017; 284:228-243. Aortic Atherosclerosis (ICD10-I70.0). Critical Value/emergent results were called by telephone at the time of interpretation on 02/10/2020 at 9:35 pm to Dr Shirlyn Goltz , who verbally acknowledged these results. Electronically Signed   By: Keith Rake M.D.   On: 02/10/2020 21:36   CT ABDOMEN PELVIS W CONTRAST  Result Date: 02/10/2020 CLINICAL DATA:  Abdominal distension. Low back pain. Weakness. Nausea. EXAM: CT ABDOMEN AND PELVIS WITH CONTRAST TECHNIQUE: Multidetector CT imaging of the abdomen and pelvis was performed using the standard protocol following bolus administration of intravenous contrast. CONTRAST:  16mL OMNIPAQUE IOHEXOL 350 MG/ML SOLN COMPARISON:  None. FINDINGS: Lower chest: Assessed on concurrent chest CT, reported separately. Small bilateral pleural effusions. Hepatobiliary: Motion artifact limitations. No focal liver abnormality is seen. Gallbladder is not well-defined on the current exam, may be completely decompressed or surgically absent. There is no biliary dilatation. Pancreas: Motion limited evaluation. No evidence of peripancreatic inflammation. No ductal dilatation. No evidence of pancreatic mass. Spleen: Normal in size without focal abnormality. Adrenals/Urinary Tract: Normal  adrenal glands. No hydronephrosis or perinephric edema. Simple cyst in the upper left kidney. Subcentimeter cortical hypodensity in the mid anterior right kidney is too small to characterize. Urinary bladder is completely empty. Stomach/Bowel: Bowel evaluation is limited due to motion and absence of enteric contrast. No abnormal gastric distension. Small duodenal diverticulum without inflammation.  There is no bowel dilatation to suggest obstruction. No evidence of bowel inflammation. Colonic diverticulosis is prominent in the sigmoid colon. There is no diverticulitis or pericolonic edema. Normal appendix. Vascular/Lymphatic: Prominent aortic and branch atherosclerosis. No aortic aneurysm. The portal vein is patent. No abdominopelvic adenopathy. Reproductive: Status post hysterectomy. No adnexal masses. Other: No free air or ascites.  No body wall hernia. Musculoskeletal: Multilevel degenerative change throughout the lumbar spine with near complete disc space loss at L2-L3, L3-L4, and L4-L5. There is multilevel facet hypertrophy. Bones are under mineralized. No acute osseous findings. No compression fracture. IMPRESSION: 1. No acute abnormality in the abdomen/pelvis, allowing for motion artifact. 2. Colonic diverticulosis without diverticulitis. 3. Multilevel degenerative change throughout the lumbar spine. Aortic Atherosclerosis (ICD10-I70.0). Electronically Signed   By: Keith Rake M.D.   On: 02/10/2020 21:40    Procedures Procedures (including critical care time) CRITICAL CARE Performed by: Wandra Arthurs   Total critical care time: 30 minutes  Critical care time was exclusive of separately billable procedures and treating other patients.  Critical care was necessary to treat or prevent imminent or life-threatening deterioration.  Critical care was time spent personally by me on the following activities: development of treatment plan with patient and/or surrogate as well as nursing, discussions  with consultants, evaluation of patient's response to treatment, examination of patient, obtaining history from patient or surrogate, ordering and performing treatments and interventions, ordering and review of laboratory studies, ordering and review of radiographic studies, pulse oximetry and re-evaluation of patient's condition.   Medications Ordered in ED Medications  heparin bolus via infusion 1,600 Units (has no administration in time range)  heparin ADULT infusion 100 units/mL (25000 units/260mL sodium chloride 0.45%) (has no administration in time range)  sodium chloride 0.9 % bolus 500 mL (0 mLs Intravenous Stopped 02/10/20 1946)  iohexol (OMNIPAQUE) 350 MG/ML injection 100 mL (100 mLs Intravenous Contrast Given 02/10/20 2050)    ED Course  I have reviewed the triage vital signs and the nursing notes.  Pertinent labs & imaging results that were available during my care of the patient were reviewed by me and considered in my medical decision making (see chart for details).    MDM Rules/Calculators/A&P                         LESLI ISSA is a 86 y.o. female who presented with shortness of breath and tachycardia.  Patient is tachycardic in the ED.  Patient appears to have new onset A. Fib.  Consider PE versus pneumonia versus Covid versus UTI, less likely to be stroke.  Plan to get CT head and CTA chest and abdomen pelvis. Will also get CBC and CMP and urinalysis  10:01 PM Patient's CTA showed bilateral PEs.  Covid negative and UA normal.  Given heparin and patient will be admitted for PE.  Final Clinical Impression(s) / ED Diagnoses Final diagnoses:  None    Rx / DC Orders ED Discharge Orders    None       Drenda Freeze, MD 02/10/20 2203

## 2020-02-10 NOTE — ED Triage Notes (Addendum)
Pts daughter in law states pt has been weak, SOB, and stated at the urgent care they saw blood urine. Pt states she is more weak then normal; pt has decreased appetite for the last few days. DNL states new right flank pain the pt c/o today.

## 2020-02-10 NOTE — ED Notes (Signed)
Report given to RN Maria.

## 2020-02-10 NOTE — Discharge Instructions (Addendum)
  Due to evidence of hydration, it is recommended you take [name] to the emergency department for further evaluation and treatment of her symptoms.  If her stat lab work is good and she is discharged home today, an antibiotic for mild UTI has been sent to the pharmacy.

## 2020-02-10 NOTE — ED Notes (Signed)
Pt placed on purewick at 60 mmHg. Will check back soon for UA. If no UA, will perform in and out cath.

## 2020-02-10 NOTE — ED Provider Notes (Addendum)
[facility]    CSN: [id number] Arrival date & time: 02/10/20  1351      History   Chief Complaint Chief Complaint  Patient presents with  . Shortness of Breath    HPI [name] is a 86 y.o. female.   HPI [name] is a 86 y.o. female presenting to UC with family member with reports of intermittent SOB, generalized weakness and lower back pain. Symptoms are worse in the morning. She was also c/o nausea this morning.  Pt lives with family who help care for her.  No cough, congestion or fever. No vomiting or diarrhea. Family member states it is difficult to get pt to eat and drink much.     Past Medical History:  Diagnosis Date  . CAD (coronary artery disease)    a. 07/2007 PCI/Promus DES to LAD;  b. 02/2010 PCI/Promus DES to dRCA.;  c. 12/2010 Lexi MV: EF 88%, no isch/infarct.;  d. 02/2012 Cath: LM nl, LAD 20p, patent mid stent, 60d, D1 sm 70ost, D2 sm, diff, LCX sm , OM1 diff 67m (no change), RCA 40ost, patent stent, PD/PL nl, EF 75% w/o rwma's.  . Carotid bruit   . Diverticulosis   . GERD (gastroesophageal reflux disease)   . Hemorrhoids   . History of echocardiogram    a. 06/2012 Echo: EF nl, mild LVH.  Marland Kitchen HLD (hyperlipidemia)   . HTN (hypertension)   . Hypothyroidism   . IBS (irritable bowel syndrome)     Patient Active Problem List   Diagnosis Date Noted  . History of echocardiogram   . GERD (gastroesophageal reflux disease)   . Diverticulosis   . Carotid bruit   . IBS (irritable bowel syndrome)   . Hypothyroidism   . HTN (hypertension)   . HLD (hyperlipidemia)   . CAD (coronary artery disease)   . Atypical chest pain 06/14/2012  . Chest pain 02/12/2012  . Fatigue 04/22/2011  . CAROTID BRUIT 03/19/2009  . HYPOTHYROIDISM 08/29/2008  . HEMORRHOIDS 08/29/2008  . GERD 08/29/2008  . DIVERTICULOSIS, COLON 08/29/2008  . HYPERLIPIDEMIA 07/04/2008  . HYPERTENSION, BENIGN 07/04/2008  . CAD, NATIVE VESSEL 07/04/2008  . IRRITABLE BOWEL SYNDROME  07/04/2008    Past Surgical History:  Procedure Laterality Date  . BACK SURGERY    . CORONARY ANGIOPLASTY WITH STENT PLACEMENT    . hysterectomy - unknown type    . LEFT HEART CATHETERIZATION WITH CORONARY ANGIOGRAM N/A 02/13/2012   Procedure: LEFT HEART CATHETERIZATION WITH CORONARY ANGIOGRAM;  Surgeon: Josue Hector, MD;  Location: [facility] CATH LAB;  Service: Cardiovascular;  Laterality: N/A;  . RECTOCELE REPAIR      OB History   No obstetric history on file.      Home Medications    Prior to Admission medications   Medication Sig Start Date End Date Taking? Authorizing Provider  amLODipine (NORVASC) 10 MG tablet  08/19/13   [provider]  aspirin 81 MG tablet Take 1 tablet (81 mg total) by mouth daily. 12/03/10   Bensimhon, Shaune Pascal, MD  Calcium Carbonate-Vitamin D (CALCIUM 600+D) 600-400 MG-UNIT per tablet Take 1 tablet by mouth daily.      [provider]  cephALEXin (KEFLEX) 500 MG capsule Take 1 capsule (500 mg total) by mouth 2 (two) times daily for 7 days. 02/10/20 02/17/20  Noe Gens, PA-C  enalapril (VASOTEC) 10 MG tablet Take 1 tablet (10 mg total) by mouth daily. 07/17/13   Leonard Schwartz, MD  isosorbide mononitrate (IMDUR)  30 MG 24 hr tablet Take 0.5 tablets (15 mg total) by mouth daily. 02/26/19 05/27/19  Lelon Perla, MD  levothyroxine (SYNTHROID, LEVOTHROID) 100 MCG tablet Take 1 tablet by mouth daily. 12/13/15   [provider]  metoprolol tartrate (LOPRESSOR) 50 MG tablet TAKE 1 TABLET BY MOUTH 2 TIMES DAILY. 04/22/19   Lelon Perla, MD  Multiple Vitamin (MULTIVITAMIN) tablet Take 1 tablet by mouth daily.      [provider]  MYRBETRIQ 50 MG TB24 tablet Take 50 mg by mouth daily. 10/31/19   [provider]  nitroGLYCERIN (NITROSTAT) 0.4 MG SL tablet Place 0.4 mg under the tongue every 5 (five) minutes as needed. For chest pain    [provider]  simvastatin (ZOCOR) 20 MG tablet Take 20 mg by mouth at  bedtime.    [provider]  traZODone (DESYREL) 50 MG tablet Take 1 tablet by mouth daily. 12/15/15   [provider]  vitamin B-12 (CYANOCOBALAMIN) 500 MCG tablet Take 500 mcg by mouth daily.    [provider]    Family History Family History  Problem Relation Age of Onset  . Myasthenia gravis Mother   . Hip fracture Father   . Heart disease Other        several uncles with CAD    Social History Social History   Tobacco Use  . Smoking status: Never Smoker  . Smokeless tobacco: Never Used  Vaping Use  . Vaping Use: Never used  Substance Use Topics  . Alcohol use: No  . Drug use: No     Allergies   Patient has no known allergies.   Review of Systems Review of Systems  Constitutional: Positive for fatigue. Negative for chills and fever.  Cardiovascular: Negative for chest pain and palpitations.  Gastrointestinal: Negative for abdominal pain, diarrhea, nausea and vomiting.  Musculoskeletal: Positive for back pain.  Neurological: Positive for weakness and headaches (intermittent chronic). Negative for dizziness and light-headedness.     Physical Exam Triage Vital Signs ED Triage Vitals  Enc Vitals Group     BP 02/10/20 1421 113/76     Pulse Rate 02/10/20 1421 100     Resp --      Temp 02/10/20 1421 98.7 F (37.1 C)     Temp Source 02/10/20 1421 Oral     SpO2 02/10/20 1421 93 %     Weight 02/10/20 1423 122 lb (55.3 kg)     Height 02/10/20 1423 5\' 4"  (1.626 m)     Head Circumference --      Peak Flow --      Pain Score 02/10/20 1422 6     Pain Loc --      Pain Edu? --      Excl. in [place]? --    No data found.  Updated Vital Signs BP 113/76 (BP Location: Right Arm)   Pulse 100   Temp 98.7 F (37.1 C) (Oral)   Ht 5\' 4"  (1.626 m)   Wt 122 lb (55.3 kg)   SpO2 93%   BMI 20.94 kg/m   Visual Acuity Right Eye Distance:   Left Eye Distance:   Bilateral Distance:    Right Eye Near:   Left Eye Near:    Bilateral Near:      Physical Exam Vitals and nursing note reviewed.  Constitutional:      Appearance: She is well-developed.     Comments: Elderly female sitting in wheelchair, NAD  HENT:  Head: Normocephalic and atraumatic.  Cardiovascular:     Rate and Rhythm: Normal rate and regular rhythm.  Pulmonary:     Effort: Pulmonary effort is normal. No respiratory distress.     Breath sounds: Examination of the right-lower field reveals decreased breath sounds. Examination of the left-lower field reveals decreased breath sounds. Decreased breath sounds present. No wheezing, rhonchi or rales.  Musculoskeletal:        General: Normal range of motion.     Cervical back: Normal range of motion.  Skin:    General: Skin is warm and dry.  Neurological:     Mental Status: She is alert and oriented to person, place, and time.  Psychiatric:        Behavior: Behavior normal.      UC Treatments / Results  Labs (all labs ordered are listed, but only abnormal results are displayed) Labs Reviewed  URINE CULTURE  POCT URINALYSIS DIP (MANUAL ENTRY)    EKG   Radiology No results found.  Procedures Procedures (including critical care time)  Medications Ordered in UC Medications - No data to display  Initial Impression / Assessment and Plan / UC Course  I have reviewed the triage vital signs and the nursing notes.  Pertinent labs & imaging results that were available during my care of the patient were reviewed by me and considered in my medical decision making (see chart for details).    Family member reports SOB this morning, pt does not appear to be SOB at this time. O2 Sat 93% on RA, pt had O2 Sat of 94% at cardiologist in August 2021.  Pt in UC for 90 minutes only able pt provider enough urine for a UA, not enough for culture. UA- proteinuria, possible early UTI Due to worsening weakness and only scant urine output, recommend further evaluation and treatment in emergency department Pt and  caregiver understanding and agreeable with tx plan.  Final Clinical Impressions(s) / UC Diagnoses   Final diagnoses:  Acute right-sided low back pain without sciatica  Acute cystitis with hematuria  Weakness  Shortness of breath  Proteinuria, unspecified type  Dehydration     Discharge Instructions      Due to evidence of hydration, it is recommended you take Mrs. Renville to the emergency department for further evaluation and treatment of her symptoms.  If her stat lab work is good and she is discharged home today, an antibiotic for mild UTI has been sent to the pharmacy.     ED Prescriptions    Medication Sig Dispense Auth. Provider   cephALEXin (KEFLEX) 500 MG capsule Take 1 capsule (500 mg total) by mouth 2 (two) times daily for 7 days. 14 capsule Noe Gens, [place]     PDMP not reviewed this encounter.     Noe Gens, PA-C 02/10/20 1550

## 2020-02-10 NOTE — ED Notes (Signed)
Patient placed on 2L [place] due to O2 being 88% on RA.

## 2020-02-10 NOTE — ED Triage Notes (Addendum)
SOB x 3 days Low back pain Weakness, nausea

## 2020-02-10 NOTE — Progress Notes (Signed)
ANTICOAGULATION CONSULT NOTE - Initial Consult  Pharmacy Consult for heparin Indication: pulmonary embolus  No Known Allergies  Patient Measurements: Height: 5\' 5"  (165.1 cm) Weight: 55.3 kg (122 lb) IBW/kg (Calculated) : 57 Heparin Dosing Weight: 55.3kg  Vital Signs: Temp: 97.8 F (36.6 C) (11/01 1719) Temp Source: Oral (11/01 1719) BP: 123/85 (11/01 2030) Pulse Rate: 115 (11/01 2030)  Labs: Recent Labs    02/10/20 1836  HGB 11.7*  HCT 36.9  PLT 182  CREATININE 0.97  TROPONINIHS 8    Estimated Creatinine Clearance: 28.9 mL/min (by C-G formula based on SCr of 0.97 mg/dL).   Medical History: Past Medical History:  Diagnosis Date  . CAD (coronary artery disease)    a. 07/2007 PCI/Promus DES to LAD;  b. 02/2010 PCI/Promus DES to dRCA.;  c. 12/2010 Lexi MV: EF 88%, no isch/infarct.;  d. 02/2012 Cath: LM nl, LAD 20p, patent mid stent, 60d, D1 sm 70ost, D2 sm, diff, LCX sm , OM1 diff 54m (no change), RCA 40ost, patent stent, PD/PL nl, EF 75% w/o rwma's.  . Carotid bruit   . Diverticulosis   . GERD (gastroesophageal reflux disease)   . Hemorrhoids   . History of echocardiogram    a. 06/2012 Echo: EF nl, mild LVH.  Marland Kitchen HLD (hyperlipidemia)   . HTN (hypertension)   . Hypothyroidism   . IBS (irritable bowel syndrome)       Assessment: 86 y.o. female presenting to UC with family member with reports of intermittent SOB, generalized weakness and lower back pain.  Pharmacy consulted to dose heparin drip for PE.  No prior AC noted.  02/10/2020  Hgb 11.7 Plts WNL Baseline aPTT and pt/INR ordered stat   Goal of Therapy:  Heparin level 0.3-0.7 units/ml Monitor platelets by anticoagulation protocol:   Plan:  Using [place] nomogram give Heparin bolus 1600 units x 1 then start heparin drip at 800 units/hr Check heparin level in 8 hours Daily CBC Follow for s/s bleeding  Dolly Rias RPh 02/10/2020, 9:45 PM

## 2020-02-10 NOTE — H&P (Signed)
History and Physical    [name] ZDG:[id number] DOB: 01-Nov-1920 DOA: 02/10/2020  PCP: Holland Commons, FNP   Patient coming from: Home   Chief Complaint: SOB, aches/malaise   HPI: [name] is a 86 y.o. female with medical history significant for hypertension, hypothyroidism, coronary artery disease, and chronic diastolic CHF, and recurrent UTI, now presenting to emergency department with shortness of breath, aches, and malaise.  She is accompanied by her daughter-in-law who assists with the history.  Patient lives at home with family and has been noted to have dyspnea with minimal exertion and sometimes at rest over the past 3 days.  She has also been expressing vague complaints of diffuse aches and pains, not eating or drinking as much as usual, has appeared uncomfortable, and more confused than usual.  She does not normally require supplemental oxygen.  No leg swelling or discoloration has been noted.  Patient has not complained of chest pain.  ED Course: Upon arrival to the ED, patient is found to be afebrile, saturating mid to upper 80s on room air, tachycardic in the 110s, and with stable blood pressure.  EKG features atrial fibrillation.  Noncontrast head CT is negative for acute intracranial abnormality.  No acute findings noted on CT of the abdomen and pelvis.  CTA chest is concerning for acute pulmonary embolism and right upper lobe nodule.  Chemistry panel is unremarkable and CBC features a mild macrocytic anemia.  High-sensitivity troponin is normal and BNP is elevated 572.  Covid screening test is negative.  Patient was started on IV heparin and supplemental oxygen in the ED.  Review of Systems:  All other systems reviewed and apart from HPI, are negative.  Past Medical History:  Diagnosis Date  . CAD (coronary artery disease)    a. 07/2007 PCI/Promus DES to LAD;  b. 02/2010 PCI/Promus DES to dRCA.;  c. 12/2010 Lexi MV: EF 88%, no isch/infarct.;  d. 02/2012 Cath: LM nl,  LAD 20p, patent mid stent, 60d, D1 sm 70ost, D2 sm, diff, LCX sm , OM1 diff 82m (no change), RCA 40ost, patent stent, PD/PL nl, EF 75% w/o rwma's.  . Carotid bruit   . Diverticulosis   . GERD (gastroesophageal reflux disease)   . Hemorrhoids   . History of echocardiogram    a. 06/2012 Echo: EF nl, mild LVH.  Marland Kitchen HLD (hyperlipidemia)   . HTN (hypertension)   . Hypothyroidism   . IBS (irritable bowel syndrome)     Past Surgical History:  Procedure Laterality Date  . BACK SURGERY    . CORONARY ANGIOPLASTY WITH STENT PLACEMENT    . hysterectomy - unknown type    . LEFT HEART CATHETERIZATION WITH CORONARY ANGIOGRAM N/A 02/13/2012   Procedure: LEFT HEART CATHETERIZATION WITH CORONARY ANGIOGRAM;  Surgeon: Josue Hector, MD;  Location: [facility] CATH LAB;  Service: Cardiovascular;  Laterality: N/A;  . RECTOCELE REPAIR      Social History:   reports that she has never smoked. She has never used smokeless tobacco. She reports that she does not drink alcohol and does not use drugs.  No Known Allergies  Family History  Problem Relation Age of Onset  . Myasthenia gravis Mother   . Hip fracture Father   . Heart disease Other        several uncles with CAD     Prior to Admission medications   Medication Sig Start Date End Date Taking? Authorizing Provider  acetaminophen (TYLENOL) 500 MG tablet Take 500 mg by  mouth every 6 (six) hours as needed for moderate pain.   Yes [provider]  amLODipine (NORVASC) 10 MG tablet Take 10 mg by mouth daily.  08/19/13  Yes [provider]  aspirin 81 MG tablet Take 1 tablet (81 mg total) by mouth daily. Patient taking differently: Take 81 mg by mouth every evening.  12/03/10  Yes Bensimhon, Shaune Pascal, MD  hydrOXYzine (ATARAX/VISTARIL) 25 MG tablet Take 25 mg by mouth at bedtime as needed for anxiety.  02/03/20  Yes [provider]  isosorbide mononitrate (IMDUR) 30 MG 24 hr tablet Take 0.5 tablets (15 mg total) by mouth daily. 02/26/19  02/10/20 Yes Lelon Perla, MD  levothyroxine (SYNTHROID, LEVOTHROID) 100 MCG tablet Take 1 tablet by mouth daily. 12/13/15  Yes [provider]  metoprolol tartrate (LOPRESSOR) 50 MG tablet TAKE 1 TABLET BY MOUTH 2 TIMES DAILY. 04/22/19  Yes Lelon Perla, MD  Multiple Vitamin (MULTIVITAMIN) tablet Take 1 tablet by mouth every evening.    Yes [provider]  MYRBETRIQ 50 MG TB24 tablet Take 50 mg by mouth every evening.  10/31/19  Yes [provider]  nitroGLYCERIN (NITROSTAT) 0.4 MG SL tablet Place 0.4 mg under the tongue every 5 (five) minutes as needed. For chest pain   Yes [provider]  simvastatin (ZOCOR) 20 MG tablet Take 20 mg by mouth at bedtime.   Yes [provider]  traZODone (DESYREL) 50 MG tablet Take 1 tablet by mouth at bedtime.  12/15/15  Yes [provider]  trimethoprim (TRIMPEX) 100 MG tablet Take 100 mg by mouth daily. 01/21/20  Yes [provider]  vitamin B-12 (CYANOCOBALAMIN) 500 MCG tablet Take 500 mcg by mouth daily.   Yes [provider]  cephALEXin (KEFLEX) 500 MG capsule Take 1 capsule (500 mg total) by mouth 2 (two) times daily for 7 days. 02/10/20 02/17/20  Noe Gens, PA-C  enalapril (VASOTEC) 10 MG tablet Take 1 tablet (10 mg total) by mouth daily. Patient not taking: Reported on 02/10/2020 07/17/13   Leonard Schwartz, MD    Physical Exam: Vitals:   02/10/20 1850 02/10/20 1930 02/10/20 2030 02/10/20 2200  BP: 127/82 125/86 123/85 108/67  Pulse: 98 (!) 114 (!) 115   Resp: (!) 24 (!) 22 19   Temp:      TempSrc:      SpO2: 99% 96% 97%   Weight:      Height:         Constitutional: NAD, calm  Eyes: PERTLA, lids and conjunctivae normal ENMT: Mucous membranes are moist. Posterior pharynx clear of any exudate or lesions.   Neck: normal, supple, no masses, no thyromegaly Respiratory:  no wheezing, no crackles. No accessory muscle use.  Cardiovascular: S1 & S2 heard, regular rate and  rhythm. No extremity edema.   Abdomen: No distension, no tenderness, soft. Bowel sounds active.  Musculoskeletal: no clubbing / cyanosis. No joint deformity upper and lower extremities.   Skin: no significant rashes, lesions, ulcers. Warm, dry, well-perfused. Neurologic: No gross facial asymmetry. Sensation intact. Moving all extremities.  Psychiatric: Awake, alert. Oriented to person only.     Labs and Imaging on Admission: I have personally reviewed following labs and imaging studies  CBC: Recent Labs  Lab 02/10/20 1836  WBC 10.5  NEUTROABS 6.9  HGB 11.7*  HCT 36.9  MCV 103.4*  PLT 462   Basic Metabolic Panel: Recent Labs  Lab 02/10/20 1836  NA 141  K 4.2  CL  103  CO2 26  GLUCOSE 120*  BUN 18  CREATININE 0.97  CALCIUM 9.3   GFR: Estimated Creatinine Clearance: 28.9 mL/min (by C-G formula based on SCr of 0.97 mg/dL). Liver Function Tests: Recent Labs  Lab 02/10/20 1836  AST 19  ALT 13  ALKPHOS 64  BILITOT 1.0  PROT 7.7  ALBUMIN 3.9   No results for input(s): LIPASE, AMYLASE in the last 168 hours. No results for input(s): AMMONIA in the last 168 hours. Coagulation Profile: No results for input(s): INR, PROTIME in the last 168 hours. Cardiac Enzymes: No results for input(s): CKTOTAL, CKMB, CKMBINDEX, TROPONINI in the last 168 hours. BNP (last 3 results) No results for input(s): PROBNP in the last 8760 hours. HbA1C: No results for input(s): HGBA1C in the last 72 hours. CBG: No results for input(s): GLUCAP in the last 168 hours. Lipid Profile: No results for input(s): CHOL, HDL, LDLCALC, TRIG, CHOLHDL, LDLDIRECT in the last 72 hours. Thyroid Function Tests: No results for input(s): TSH, T4TOTAL, FREET4, T3FREE, THYROIDAB in the last 72 hours. Anemia Panel: No results for input(s): VITAMINB12, FOLATE, FERRITIN, TIBC, IRON, RETICCTPCT in the last 72 hours. Urine analysis:    Component Value Date/Time   COLORURINE AMBER (A) 02/10/2020 1800   APPEARANCEUR  CLEAR 02/10/2020 1800   LABSPEC 1.025 02/10/2020 1800   PHURINE 5.0 02/10/2020 1800   GLUCOSEU NEGATIVE 02/10/2020 1800   HGBUR NEGATIVE 02/10/2020 1800   BILIRUBINUR NEGATIVE 02/10/2020 1800   BILIRUBINUR negative 02/10/2020 1608   KETONESUR NEGATIVE 02/10/2020 1800   KETONESUR negative 02/10/2020 1608   PROTEINUR 30 (A) 02/10/2020 1800   PROTEINUR >=300 (A) 02/10/2020 1608   UROBILINOGEN 1.0 02/10/2020 1608   UROBILINOGEN 0.2 02/13/2012 0221   NITRITE NEGATIVE 02/10/2020 1800   NITRITE Negative 02/10/2020 1608   LEUKOCYTESUR NEGATIVE 02/10/2020 1800   LEUKOCYTESUR Trace (A) 02/10/2020 1608   Sepsis Labs: @LABRCNTIP (procalcitonin:4,lacticidven:4) ) Recent Results (from the past 240 hour(s))  Respiratory Panel by RT PCR (Flu A&B, Covid) - Nasopharyngeal Swab     Status: None   Collection Time: 02/10/20  6:00 PM   Specimen: Nasopharyngeal Swab  Result Value Ref Range Status   SARS Coronavirus 2 by RT PCR NEGATIVE NEGATIVE Final    Comment: (NOTE) SARS-CoV-2 target nucleic acids are NOT DETECTED.  The SARS-CoV-2 RNA is generally detectable in upper respiratoy specimens during the acute phase of infection. The lowest concentration of SARS-CoV-2 viral copies this assay can detect is 131 copies/mL. A negative result does not preclude SARS-Cov-2 infection and should not be used as the sole basis for treatment or other patient management decisions. A negative result may occur with  improper specimen collection/handling, submission of specimen other than nasopharyngeal swab, presence of viral mutation(s) within the areas targeted by this assay, and inadequate number of viral copies (<131 copies/mL). A negative result must be combined with clinical observations, patient history, and epidemiological information. The expected result is Negative.  Fact Sheet for Patients:  PinkCheek.be  Fact Sheet for Healthcare Providers:    GravelBags.it  This test is no t yet approved or cleared by the [place] FDA and  has been authorized for detection and/or diagnosis of SARS-CoV-2 by FDA under an Emergency Use Authorization (EUA). This EUA will remain  in effect (meaning this test can be used) for the duration of the COVID-19 declaration under Section 564(b)(1) of the Act, 21 U.S.C. section 360bbb-3(b)(1), unless the authorization is terminated or revoked sooner.     Influenza A by PCR NEGATIVE  NEGATIVE Final   Influenza B by PCR NEGATIVE NEGATIVE Final    Comment: (NOTE) The Xpert Xpress SARS-CoV-2/FLU/RSV assay is intended as an aid in  the diagnosis of influenza from Nasopharyngeal swab specimens and  should not be used as a sole basis for treatment. Nasal washings and  aspirates are unacceptable for Xpert Xpress SARS-CoV-2/FLU/RSV  testing.  Fact Sheet for Patients: PinkCheek.be  Fact Sheet for Healthcare Providers: GravelBags.it  This test is not yet approved or cleared by the [place] FDA and  has been authorized for detection and/or diagnosis of SARS-CoV-2 by  FDA under an Emergency Use Authorization (EUA). This EUA will remain  in effect (meaning this test can be used) for the duration of the  Covid-19 declaration under Section 564(b)(1) of the Act, 21  U.S.C. section 360bbb-3(b)(1), unless the authorization is  terminated or revoked. Performed at [facility], [place] [address], [place], [place] [address]      Radiological Exams on Admission: CT Head Wo Contrast  Result Date: 02/10/2020 CLINICAL DATA:  Dizziness.  Mental status change. EXAM: CT HEAD WITHOUT CONTRAST TECHNIQUE: Contiguous axial images were obtained from the base of the skull through the vertex without intravenous contrast. COMPARISON:  None. FINDINGS: The study is mildly motion degraded. Brain: No acute large territory  infarct, intracranial hemorrhage, mass, midline shift, or extra-axial fluid collection is identified. Lacunar infarcts in the left basal ganglia and left thalamus are likely chronic. Hypodensities in the cerebral white matter bilaterally are nonspecific but compatible with moderate chronic small vessel ischemic disease. A small age indeterminate cortical infarct is questioned in the left parietal lobe although this is in a region of motion and may be artifactual. There is mild lateral and third ventriculomegaly which may reflect central predominant cerebral atrophy with hydrocephalus considered less likely. Vascular: Calcified atherosclerosis at the skull base. No hyperdense vessel. Skull: No fracture or suspicious osseous lesion. Sinuses/Orbits: Visualized paranasal sinuses and mastoid air cells are clear. Bilateral cataract extraction. Other: Likely iatrogenic venous gas in the right frontotemporal scalp. IMPRESSION: 1. No evidence of acute large territory infarct or intracranial hemorrhage. 2. Small age indeterminate left parietal cortical infarct versus artifact. 3. Moderate chronic small vessel ischemic disease. Electronically Signed   By: Logan Bores M.D.   On: 02/10/2020 21:32   CT Angio Chest PE W and/or Wo Contrast  Result Date: 02/10/2020 CLINICAL DATA:  86 year old with shortness of breath. New onset atrial fibrillation. Shortness of breath for 3 days. Low back pain. Weakness. Nausea. EXAM: CT ANGIOGRAPHY CHEST WITH CONTRAST TECHNIQUE: Multidetector CT imaging of the chest was performed using the standard protocol during bolus administration of intravenous contrast. Multiplanar CT image reconstructions and MIPs were obtained to evaluate the vascular anatomy. Performed in conjunction with CT of the abdomen and pelvis, reported separately. CONTRAST:  166mL OMNIPAQUE IOHEXOL 350 MG/ML SOLN COMPARISON:  No recent exams.  Chest CT 02/14/2012. FINDINGS: Cardiovascular: Positive for acute pulmonary embolus  with filling defect in the segmental branches of the left lower lobe, series 9 image 69, and possibly a few subsegmental branches in the right lower lobe, series 9 image 71. Thromboembolic burden is small. Probable contrast mixing in the left atrial appendage. Mild right heart dilatation as well as biatrial enlargement. Contrast refluxes into the hepatic veins and IVC. There are coronary artery calcifications. Dense aortic atherosclerosis. No aortic aneurysm. Mediastinum/Nodes: No mediastinal or hilar adenopathy. No esophageal wall thickening. No visualized thyroid nodule. Lungs/Pleura: Mild biapical pleuroparenchymal scarring. Breathing motion artifact  limits detailed parenchymal assessment. Small bilateral pleural effusions with minimal adjacent atelectasis. Subsegmental opacities in the periphery of the right lower and middle lobe favor atelectasis. There is an 8 mm ground-glass nodule at the right lung apex, series 7, image 29. No evidence of tracheal or bronchial lesions, motion partially obscures detailed assessment. No septal thickening or convincing pulmonary edema Upper Abdomen: Assessed on concurrent abdominal CT. Contrast refluxes into the hepatic veins and IVC. Musculoskeletal: There are no acute or suspicious osseous abnormalities. Review of the MIP images confirms the above findings. IMPRESSION: 1. Positive for acute pulmonary embolus in the segmental branches of the left lower lobe and possibly a few subsegmental branches in the right lower lobe. Thromboembolic burden is small. 2. Mild right heart dilatation as well as biatrial enlargement. Contrast refluxes into the hepatic veins and IVC, consistent with elevated right heart pressures. 3. Small bilateral pleural effusions with minimal adjacent atelectasis. 4. Right upper lobe 8 mm ground-glass nodule. Initial follow-up with CT at 6-12 months is recommended to confirm persistence. If persistent, repeat CT is recommended every 2 years until 5 years of  stability has been established, giving consideration for patient's advanced age. This recommendation follows the consensus statement: Guidelines for Management of Incidental Pulmonary Nodules Detected on CT Images: From the Fleischner Society 2017; Radiology 2017; 284:228-243. Aortic Atherosclerosis (ICD10-I70.0). Critical Value/emergent results were called by telephone at the time of interpretation on 02/10/2020 at 9:35 pm to Dr Shirlyn Goltz , who verbally acknowledged these results. Electronically Signed   By: Keith Rake M.D.   On: 02/10/2020 21:36   CT ABDOMEN PELVIS W CONTRAST  Result Date: 02/10/2020 CLINICAL DATA:  Abdominal distension. Low back pain. Weakness. Nausea. EXAM: CT ABDOMEN AND PELVIS WITH CONTRAST TECHNIQUE: Multidetector CT imaging of the abdomen and pelvis was performed using the standard protocol following bolus administration of intravenous contrast. CONTRAST:  164mL OMNIPAQUE IOHEXOL 350 MG/ML SOLN COMPARISON:  None. FINDINGS: Lower chest: Assessed on concurrent chest CT, reported separately. Small bilateral pleural effusions. Hepatobiliary: Motion artifact limitations. No focal liver abnormality is seen. Gallbladder is not well-defined on the current exam, may be completely decompressed or surgically absent. There is no biliary dilatation. Pancreas: Motion limited evaluation. No evidence of peripancreatic inflammation. No ductal dilatation. No evidence of pancreatic mass. Spleen: Normal in size without focal abnormality. Adrenals/Urinary Tract: Normal adrenal glands. No hydronephrosis or perinephric edema. Simple cyst in the upper left kidney. Subcentimeter cortical hypodensity in the mid anterior right kidney is too small to characterize. Urinary bladder is completely empty. Stomach/Bowel: Bowel evaluation is limited due to motion and absence of enteric contrast. No abnormal gastric distension. Small duodenal diverticulum without inflammation. There is no bowel dilatation to suggest  obstruction. No evidence of bowel inflammation. Colonic diverticulosis is prominent in the sigmoid colon. There is no diverticulitis or pericolonic edema. Normal appendix. Vascular/Lymphatic: Prominent aortic and branch atherosclerosis. No aortic aneurysm. The portal vein is patent. No abdominopelvic adenopathy. Reproductive: Status post hysterectomy. No adnexal masses. Other: No free air or ascites.  No body wall hernia. Musculoskeletal: Multilevel degenerative change throughout the lumbar spine with near complete disc space loss at L2-L3, L3-L4, and L4-L5. There is multilevel facet hypertrophy. Bones are under mineralized. No acute osseous findings. No compression fracture. IMPRESSION: 1. No acute abnormality in the abdomen/pelvis, allowing for motion artifact. 2. Colonic diverticulosis without diverticulitis. 3. Multilevel degenerative change throughout the lumbar spine. Aortic Atherosclerosis (ICD10-I70.0). Electronically Signed   By: Keith Rake M.D.   On: 02/10/2020 21:40  EKG: Independently reviewed. Atrial fibrillation.   Assessment/Plan   1. Acute PE; acute hypoxic respiratory failure  - Presents with 3 days of SOB and is found to have new 2 Lpm supplemental O2 requirement and acute PE  - She has been started on IV heparin in ED  - Continue IV heparin, check echocardiogram, hold beta-blocker initially, continue supplemental O2 as needed    2. Atrial fibrillation  - Atrial fibrillation noted in ED, appears to be new  - Likely precipitated by acute PE  - Check mag level, TSH, and echocardiogram - CHADS-VASc is 47 (age x2, gender, CAD, CHF, HTN) and she has been started on IV heparin    3. Hypertension  - BP at goal  - Metoprolol held initially in setting of acute PE pending echocardiogram    4. CAD - Continue aspirin, statin, and nitrates   5. Hypothyroidism  - Continue Synthroid    6. Chronic diastolic CHF  - EF was preserved in 2017  - She appears euvolemic in ED and BNP  elevation may be secondary to acute PE   7. Lung nodule - Incidentally noted on CT in ED  - Outpatient follow up recommended    DVT prophylaxis: IV heparin  Code Status: DNR, confirmed with family on admission  Family Communication: Bettyjane Shenoy (daughter in law) updated at bedside  Disposition Plan:  Patient is from: Home  Anticipated d/c is to: TBD Anticipated d/c date is: 02/12/20 Patient currently: Pending echocardiogram, conversion to oral anticoagulant  Consults called: None  Admission status: Observation    Vianne Bulls, MD [facility]  02/10/2020, 11:09 PM

## 2020-02-11 ENCOUNTER — Inpatient Hospital Stay (HOSPITAL_COMMUNITY): Payer: PPO

## 2020-02-11 ENCOUNTER — Observation Stay (HOSPITAL_COMMUNITY): Payer: PPO

## 2020-02-11 DIAGNOSIS — I130 Hypertensive heart and chronic kidney disease with heart failure and stage 1 through stage 4 chronic kidney disease, or unspecified chronic kidney disease: Secondary | ICD-10-CM | POA: Diagnosis present

## 2020-02-11 DIAGNOSIS — Z20822 Contact with and (suspected) exposure to covid-19: Secondary | ICD-10-CM | POA: Diagnosis present

## 2020-02-11 DIAGNOSIS — I5032 Chronic diastolic (congestive) heart failure: Secondary | ICD-10-CM | POA: Diagnosis not present

## 2020-02-11 DIAGNOSIS — Z66 Do not resuscitate: Secondary | ICD-10-CM | POA: Diagnosis present

## 2020-02-11 DIAGNOSIS — I5033 Acute on chronic diastolic (congestive) heart failure: Secondary | ICD-10-CM | POA: Diagnosis present

## 2020-02-11 DIAGNOSIS — J9811 Atelectasis: Secondary | ICD-10-CM | POA: Diagnosis present

## 2020-02-11 DIAGNOSIS — Z79899 Other long term (current) drug therapy: Secondary | ICD-10-CM | POA: Diagnosis not present

## 2020-02-11 DIAGNOSIS — E038 Other specified hypothyroidism: Secondary | ICD-10-CM

## 2020-02-11 DIAGNOSIS — E039 Hypothyroidism, unspecified: Secondary | ICD-10-CM | POA: Diagnosis present

## 2020-02-11 DIAGNOSIS — I2694 Multiple subsegmental pulmonary emboli without acute cor pulmonale: Secondary | ICD-10-CM | POA: Diagnosis not present

## 2020-02-11 DIAGNOSIS — Z87440 Personal history of urinary (tract) infections: Secondary | ICD-10-CM | POA: Diagnosis not present

## 2020-02-11 DIAGNOSIS — I2601 Septic pulmonary embolism with acute cor pulmonale: Secondary | ICD-10-CM

## 2020-02-11 DIAGNOSIS — I480 Paroxysmal atrial fibrillation: Secondary | ICD-10-CM | POA: Diagnosis present

## 2020-02-11 DIAGNOSIS — Z79891 Long term (current) use of opiate analgesic: Secondary | ICD-10-CM | POA: Diagnosis not present

## 2020-02-11 DIAGNOSIS — K589 Irritable bowel syndrome without diarrhea: Secondary | ICD-10-CM | POA: Diagnosis present

## 2020-02-11 DIAGNOSIS — D539 Nutritional anemia, unspecified: Secondary | ICD-10-CM | POA: Diagnosis present

## 2020-02-11 DIAGNOSIS — K219 Gastro-esophageal reflux disease without esophagitis: Secondary | ICD-10-CM | POA: Diagnosis present

## 2020-02-11 DIAGNOSIS — R911 Solitary pulmonary nodule: Secondary | ICD-10-CM | POA: Diagnosis not present

## 2020-02-11 DIAGNOSIS — Z7982 Long term (current) use of aspirin: Secondary | ICD-10-CM | POA: Diagnosis not present

## 2020-02-11 DIAGNOSIS — Z7189 Other specified counseling: Secondary | ICD-10-CM | POA: Diagnosis not present

## 2020-02-11 DIAGNOSIS — J9601 Acute respiratory failure with hypoxia: Secondary | ICD-10-CM | POA: Diagnosis not present

## 2020-02-11 DIAGNOSIS — I2510 Atherosclerotic heart disease of native coronary artery without angina pectoris: Secondary | ICD-10-CM | POA: Diagnosis present

## 2020-02-11 DIAGNOSIS — I2699 Other pulmonary embolism without acute cor pulmonale: Secondary | ICD-10-CM | POA: Diagnosis not present

## 2020-02-11 DIAGNOSIS — Z79890 Hormone replacement therapy (postmenopausal): Secondary | ICD-10-CM | POA: Diagnosis not present

## 2020-02-11 DIAGNOSIS — E785 Hyperlipidemia, unspecified: Secondary | ICD-10-CM | POA: Diagnosis present

## 2020-02-11 DIAGNOSIS — Z955 Presence of coronary angioplasty implant and graft: Secondary | ICD-10-CM | POA: Diagnosis not present

## 2020-02-11 DIAGNOSIS — R54 Age-related physical debility: Secondary | ICD-10-CM | POA: Diagnosis present

## 2020-02-11 DIAGNOSIS — R319 Hematuria, unspecified: Secondary | ICD-10-CM | POA: Diagnosis present

## 2020-02-11 DIAGNOSIS — N1831 Chronic kidney disease, stage 3a: Secondary | ICD-10-CM | POA: Diagnosis present

## 2020-02-11 DIAGNOSIS — R0600 Dyspnea, unspecified: Secondary | ICD-10-CM | POA: Diagnosis not present

## 2020-02-11 DIAGNOSIS — I4891 Unspecified atrial fibrillation: Secondary | ICD-10-CM | POA: Diagnosis not present

## 2020-02-11 DIAGNOSIS — I10 Essential (primary) hypertension: Secondary | ICD-10-CM | POA: Diagnosis not present

## 2020-02-11 LAB — BASIC METABOLIC PANEL
Anion gap: 8 (ref 5–15)
BUN: 16 mg/dL (ref 8–23)
CO2: 25 mmol/L (ref 22–32)
Calcium: 8.6 mg/dL — ABNORMAL LOW (ref 8.9–10.3)
Chloride: 105 mmol/L (ref 98–111)
Creatinine, Ser: 0.9 mg/dL (ref 0.44–1.00)
GFR, Estimated: 58 mL/min — ABNORMAL LOW (ref >60)
Glucose, Bld: 100 mg/dL — ABNORMAL HIGH (ref 70–99)
Potassium: 4.4 mmol/L (ref 3.5–5.1)
Sodium: 138 mmol/L (ref 135–145)

## 2020-02-11 LAB — TSH: TSH: 0.098 u[IU]/mL — ABNORMAL LOW (ref 0.350–4.500)

## 2020-02-11 LAB — APTT: aPTT: 102 seconds — ABNORMAL HIGH (ref 24–36)

## 2020-02-11 LAB — CBC
HCT: 35.8 % — ABNORMAL LOW (ref 36.0–46.0)
Hemoglobin: 11.3 g/dL — ABNORMAL LOW (ref 12.0–15.0)
MCH: 32.8 pg (ref 26.0–34.0)
MCHC: 31.6 g/dL (ref 30.0–36.0)
MCV: 103.8 fL — ABNORMAL HIGH (ref 80.0–100.0)
Platelets: 165 10*3/uL (ref 150–400)
RBC: 3.45 MIL/uL — ABNORMAL LOW (ref 3.87–5.11)
RDW: 12.7 % (ref 11.5–15.5)
WBC: 8.5 10*3/uL (ref 4.0–10.5)
nRBC: 0 % (ref 0.0–0.2)

## 2020-02-11 LAB — URINE CULTURE: Culture: NO GROWTH

## 2020-02-11 LAB — MAGNESIUM: Magnesium: 2.4 mg/dL (ref 1.7–2.4)

## 2020-02-11 LAB — PROTIME-INR
INR: 1.1 (ref 0.8–1.2)
Prothrombin Time: 14.1 seconds (ref 11.4–15.2)

## 2020-02-11 LAB — ECHOCARDIOGRAM COMPLETE
Area-P 1/2: 3.08 cm2
Height: 65 in
S' Lateral: 2.2 cm
Weight: 2049.6 oz

## 2020-02-11 LAB — HEPARIN LEVEL (UNFRACTIONATED)
Heparin Unfractionated: 0.35 IU/mL (ref 0.30–0.70)
Heparin Unfractionated: 0.34 IU/mL (ref 0.30–0.70)

## 2020-02-11 LAB — T4, FREE: Free T4: 1.51 ng/dL — ABNORMAL HIGH (ref 0.61–1.12)

## 2020-02-11 NOTE — Progress Notes (Signed)
Patient in A-fib

## 2020-02-11 NOTE — Progress Notes (Signed)
ANTICOAGULATION CONSULT NOTE - Initial Consult  Pharmacy Consult for heparin Indication: pulmonary embolus  No Known Allergies  Patient Measurements: Height: 5\' 5"  (165.1 cm) Weight: 58.1 kg (128 lb 1.6 oz) IBW/kg (Calculated) : 57 Heparin Dosing Weight: 55.3kg  Vital Signs: Temp: 99.6 F (37.6 C) (11/02 0745) Temp Source: Oral (11/02 0745) BP: 105/77 (11/02 0745) Pulse Rate: 120 (11/02 0745)  Labs: Recent Labs    02/10/20 1836 02/10/20 2318 02/11/20 0723  HGB 11.7*  --  11.3*  HCT 36.9  --  35.8*  PLT 182  --  165  APTT  --  102*  --   LABPROT  --  14.1  --   INR  --  1.1  --   HEPARINUNFRC  --   --  0.35  CREATININE 0.97  --  0.90  TROPONINIHS 8  --   --     Estimated Creatinine Clearance: 32.2 mL/min (by C-G formula based on SCr of 0.9 mg/dL).   Medical History: Past Medical History:  Diagnosis Date  . CAD (coronary artery disease)    a. 07/2007 PCI/Promus DES to LAD;  b. 02/2010 PCI/Promus DES to dRCA.;  c. 12/2010 Lexi MV: EF 88%, no isch/infarct.;  d. 02/2012 Cath: LM nl, LAD 20p, patent mid stent, 60d, D1 sm 70ost, D2 sm, diff, LCX sm , OM1 diff 66m (no change), RCA 40ost, patent stent, PD/PL nl, EF 75% w/o rwma's.  . Carotid bruit   . Diverticulosis   . GERD (gastroesophageal reflux disease)   . Hemorrhoids   . History of echocardiogram    a. 06/2012 Echo: EF nl, mild LVH.  Marland Kitchen HLD (hyperlipidemia)   . HTN (hypertension)   . Hypothyroidism   . IBS (irritable bowel syndrome)       Assessment: 86 y.o. female presenting to UC with family member with reports of intermittent SOB, generalized weakness and lower back pain.  Pharmacy consulted to dose heparin drip for PE.  No prior AC noted.  02/11/2020  HL 0.35 at goal Hgb stable Plts WNL No bleeding reported    Goal of Therapy:  Heparin level 0.3-0.7 units/ml Monitor platelets by anticoagulation protocol:   Plan:  Continue heparin infusion at 800 units/hr Check confirmatory heparin level in 8  hours Daily CBC Follow for s/s bleeding  Ulice Dash, PharmD, [place] [phone] 02/11/2020, 8:20 AM

## 2020-02-11 NOTE — Plan of Care (Signed)
?  Problem: Clinical Measurements: ?Goal: Ability to maintain clinical measurements within normal limits will improve ?Outcome: Not Progressing ?  ?

## 2020-02-11 NOTE — Progress Notes (Signed)
ANTICOAGULATION CONSULT NOTE  Pharmacy Consult for heparin Indication: pulmonary embolus, atrial fibrillation   No Known Allergies  Patient Measurements: Height: 5\' 5"  (165.1 cm) Weight: 58.1 kg (128 lb 1.6 oz) IBW/kg (Calculated) : 57 Heparin Dosing Weight: actual body weight   Vital Signs: Temp: 97.3 F (36.3 C) (11/02 1348) Temp Source: Oral (11/02 1348) BP: 113/59 (11/02 1348) Pulse Rate: 111 (11/02 1348)  Labs: Recent Labs    02/10/20 1836 02/10/20 2318 02/11/20 0723 02/11/20 1411  HGB 11.7*  --  11.3*  --   HCT 36.9  --  35.8*  --   PLT 182  --  165  --   APTT  --  102*  --   --   LABPROT  --  14.1  --   --   INR  --  1.1  --   --   HEPARINUNFRC  --   --  0.35 0.34  CREATININE 0.97  --  0.90  --   TROPONINIHS 8  --   --   --     Estimated Creatinine Clearance: 32.2 mL/min (by C-G formula based on SCr of 0.9 mg/dL).   Medical History: Past Medical History:  Diagnosis Date  . CAD (coronary artery disease)    a. 07/2007 PCI/Promus DES to LAD;  b. 02/2010 PCI/Promus DES to dRCA.;  c. 12/2010 Lexi MV: EF 88%, no isch/infarct.;  d. 02/2012 Cath: LM nl, LAD 20p, patent mid stent, 60d, D1 sm 70ost, D2 sm, diff, LCX sm , OM1 diff 37m (no change), RCA 40ost, patent stent, PD/PL nl, EF 75% w/o rwma's.  . Carotid bruit   . Diverticulosis   . GERD (gastroesophageal reflux disease)   . Hemorrhoids   . History of echocardiogram    a. 06/2012 Echo: EF nl, mild LVH.  Marland Kitchen HLD (hyperlipidemia)   . HTN (hypertension)   . Hypothyroidism   . IBS (irritable bowel syndrome)       Assessment: 86 y/o female presenting to UC with family member with reports of intermittent SOB, generalized weakness and lower back pain. CTa chest + for PE. New atrial fibrillation also noted in the ED. Pharmacy consulted to for IV heparin dosing. No prior AC noted.  Today, 02/11/20:   1411 heparin level = 0.34 units/mL, remains therapeutic   CBC: Hgb 11.3, stable. Pltc WNL.   No bleeding or  infusion issues noted per nursing    Goal of Therapy:  Heparin level 0.3-0.7 units/ml Monitor platelets by anticoagulation protocol: yes   Plan:   Continue heparin infusion at 800 units/hr  Daily CBC, heparin level  Monitor closely for s/sx of bleeding   Lindell Spar, PharmD, [place] [phone] 02/11/2020, 3:46 PM

## 2020-02-11 NOTE — Progress Notes (Addendum)
   02/11/20 0745  Assess: MEWS Score  Temp 99.6 F (37.6 C)  BP 105/77  Pulse Rate (!) 120  Resp 20  SpO2 93 %  O2 Device Nasal Cannula  O2 Flow Rate (L/min) 2 L/min  Assess: MEWS Score  MEWS Temp 0  MEWS Systolic 0  MEWS Pulse 2  MEWS RR 0  MEWS LOC 0  MEWS Score 2  MEWS Score Color Yellow  Assess: if the MEWS score is Yellow or Red  Were vital signs taken at a resting state? Yes  Focused Assessment No change from prior assessment  Early Detection of Sepsis Score *See Row Information* Medium  MEWS guidelines implemented *See Row Information* No, previously yellow, continue vital signs every 4 hours  continue Yellow MEWS monitoring that was initiated at 0136 from previous RN

## 2020-02-11 NOTE — Progress Notes (Signed)
  Echocardiogram 2D Echocardiogram has been performed.  [name] 02/11/2020, 8:34 AM

## 2020-02-11 NOTE — Progress Notes (Signed)
PROGRESS NOTE    [name]  WLN:[id number] DOB: 11-01-1920 DOA: 02/10/2020 PCP: Holland Commons, FNP     Brief Narrative:  [name] is a 86 y.o. WF PMHx HTN, CAD, Chronic Diastolic CHF, Hyothyroidism,  and recurrent UTI,   now presenting to emergency department with shortness of breath, aches, and malaise.  She is accompanied by her daughter-in-law who assists with the history.  Patient lives at home with family and has been noted to have dyspnea with minimal exertion and sometimes at rest over the past 3 days.  She has also been expressing vague complaints of diffuse aches and pains, not eating or drinking as much as usual, has appeared uncomfortable, and more confused than usual.  She does not normally require supplemental oxygen.  No leg swelling or discoloration has been noted.  Patient has not complained of chest pain.  ED Course: Upon arrival to the ED, patient is found to be afebrile, saturating mid to upper 80s on room air, tachycardic in the 110s, and with stable blood pressure.  EKG features atrial fibrillation.  Noncontrast head CT is negative for acute intracranial abnormality.  No acute findings noted on CT of the abdomen and pelvis.  CTA chest is concerning for acute pulmonary embolism and right upper lobe nodule.  Chemistry panel is unremarkable and CBC features a mild macrocytic anemia.  High-sensitivity troponin is normal and BNP is elevated 572.  Covid screening test is negative.  Patient was started on IV heparin and supplemental oxygen in the ED.   Subjective: Afebrile overnight A/O x4, states has previously been on anticoagulant in the distant past.  Son confirms this.  No previous history of A. fib.  Not on previous home O2   Assessment & Plan: Covid vaccination;   Principal Problem:   Acute pulmonary embolism ([place]) Active Problems:   HYPERTENSION, BENIGN   CAD, NATIVE VESSEL   Hypothyroidism   Chronic diastolic CHF (congestive heart failure) (HCC)    New onset atrial fibrillation (HCC)   Lung nodule   Acute PE; acute hypoxic respiratory failure  - Presents with 3 days of SOB and is found to have new 2 Lpm supplemental O2 requirement and acute PE  - Continue IV heparin -11/2 echocardiogram pending -11/2 lower extremity Dopplers pending -, Titrate O2 to maintain O2> 88%   Chronic diastolic CHF -Strict in and out -Daily weight -Hold all BP medication, would restart metoprolol first if patient's HR increases and BP can tolerate  Atrial fibrillation  - New onset per patient and son  - Likely precipitated by acute PE  -TSH level significantly low -Free T3/T4 pending; depending upon levels will determine how much to decrease Synthroid. - CHADS-VASc is 30 (age x2, gender, CAD, CHF, HTN) and she has been started on IV heparin -After results of echocardiogram and lower extremity Doppler are in would order   PT/OT consult, and then determine to either start DOAC vs Coumadin for this 86 year old.  Plan of care discussed with son  Essential Hypertension  - BP low for a 86 year old -See CHF     CAD - Zocor 20 mg daily ,    Hypothyroidism  -TSH<<<low see A. fib - Continue Synthroid 100 mcg daily   Lung nodule - Incidentally noted on CT in ED  - Outpatient follow up recommended    DVT prophylaxis: Heparin drip Code Status: DNR Family Communication: 11/2 son at bedside and daughter-in-law on phone discussed plan of care answered all questions Status is:  Inpatient    Dispo: The patient is from: Home              Anticipated d/c is to: SNF vs home              Anticipated d/c date is: 11/5              Patient currently unstable      Consultants:    Procedures/Significant Events:    I have personally reviewed and interpreted all radiology studies and my findings are as above.  VENTILATOR SETTINGS: Nasal cannula 11/2 Flow 2 L/min SPO2 95%   Cultures 11/1 SARS coronavirus negative 11/1 influenza A/B  negative    Antimicrobials:    Devices    LINES / TUBES:      Continuous Infusions: . sodium chloride    . heparin 800 Units/hr (02/10/20 2315)     Objective: Vitals:   02/10/20 2200 02/10/20 2335 02/11/20 0118 02/11/20 0350  BP: 108/67 124/72 118/65 100/70  Pulse:  (!) 123 (!) 106 (S) (!) 115  Resp:  20 16 20   Temp:  98.3 F (36.8 C) 98 F (36.7 C) 98.1 F (36.7 C)  TempSrc:  Axillary Oral Oral  SpO2:  94% 97% 95%  Weight:  58.1 kg    Height:  5\' 5"  (1.651 m)      Intake/Output Summary (Last 24 hours) at 02/11/2020 6948 Last data filed at 02/11/2020 0600 Gross per 24 hour  Intake 1485.02 ml  Output --  Net 1485.02 ml   Filed Weights   02/10/20 1739 02/10/20 2335  Weight: 55.3 kg 58.1 kg    Examination:  General: A/O x4, positive acute respiratory distress Eyes: negative scleral hemorrhage, negative anisocoria, negative icterus ENT: Negative Runny nose, negative gingival bleeding, Neck:  Negative scars, masses, torticollis, lymphadenopathy, JVD Lungs: Clear to auscultation bilaterally without wheezes or crackles Cardiovascular: Irregular irregular rhythm and rate without murmur gallop or rub normal S1 and S2 Abdomen: negative abdominal pain, nondistended, positive soft, bowel sounds, no rebound, no ascites, no appreciable mass Extremities: No significant cyanosis, clubbing, or edema bilateral lower extremities Skin: Negative rashes, lesions, ulcers Psychiatric:  Negative depression, negative anxiety, negative fatigue, negative mania  Central nervous system:  Cranial nerves II through XII intact, tongue/uvula midline, all extremities muscle strength 5/5, sensation intact throughout, negative dysarthria, negative expressive aphasia, negative receptive aphasia.  .     Data Reviewed: Care during the described time interval was provided by me .  I have reviewed this patient's available data, including medical history, events of note, physical examination,  and all test results as part of my evaluation.  CBC: Recent Labs  Lab 02/10/20 1836  WBC 10.5  NEUTROABS 6.9  HGB 11.7*  HCT 36.9  MCV 103.4*  PLT 546   Basic Metabolic Panel: Recent Labs  Lab 02/10/20 1836  NA 141  K 4.2  CL 103  CO2 26  GLUCOSE 120*  BUN 18  CREATININE 0.97  CALCIUM 9.3   GFR: Estimated Creatinine Clearance: 29.8 mL/min (by C-G formula based on SCr of 0.97 mg/dL). Liver Function Tests: Recent Labs  Lab 02/10/20 1836  AST 19  ALT 13  ALKPHOS 64  BILITOT 1.0  PROT 7.7  ALBUMIN 3.9   No results for input(s): LIPASE, AMYLASE in the last 168 hours. No results for input(s): AMMONIA in the last 168 hours. Coagulation Profile: Recent Labs  Lab 02/10/20 2318  INR 1.1   Cardiac Enzymes: No results for input(s): CKTOTAL,  CKMB, CKMBINDEX, TROPONINI in the last 168 hours. BNP (last 3 results) No results for input(s): PROBNP in the last 8760 hours. HbA1C: No results for input(s): HGBA1C in the last 72 hours. CBG: No results for input(s): GLUCAP in the last 168 hours. Lipid Profile: No results for input(s): CHOL, HDL, LDLCALC, TRIG, CHOLHDL, LDLDIRECT in the last 72 hours. Thyroid Function Tests: No results for input(s): TSH, T4TOTAL, FREET4, T3FREE, THYROIDAB in the last 72 hours. Anemia Panel: No results for input(s): VITAMINB12, FOLATE, FERRITIN, TIBC, IRON, RETICCTPCT in the last 72 hours. Sepsis Labs: No results for input(s): PROCALCITON, LATICACIDVEN in the last 168 hours.  Recent Results (from the past 240 hour(s))  Respiratory Panel by RT PCR (Flu A&B, Covid) - Nasopharyngeal Swab     Status: None   Collection Time: 02/10/20  6:00 PM   Specimen: Nasopharyngeal Swab  Result Value Ref Range Status   SARS Coronavirus 2 by RT PCR NEGATIVE NEGATIVE Final    Comment: (NOTE) SARS-CoV-2 target nucleic acids are NOT DETECTED.  The SARS-CoV-2 RNA is generally detectable in upper respiratoy specimens during the acute phase of infection. The  lowest concentration of SARS-CoV-2 viral copies this assay can detect is 131 copies/mL. A negative result does not preclude SARS-Cov-2 infection and should not be used as the sole basis for treatment or other patient management decisions. A negative result may occur with  improper specimen collection/handling, submission of specimen other than nasopharyngeal swab, presence of viral mutation(s) within the areas targeted by this assay, and inadequate number of viral copies (<131 copies/mL). A negative result must be combined with clinical observations, patient history, and epidemiological information. The expected result is Negative.  Fact Sheet for Patients:  PinkCheek.be  Fact Sheet for Healthcare Providers:  GravelBags.it  This test is no t yet approved or cleared by the [place] FDA and  has been authorized for detection and/or diagnosis of SARS-CoV-2 by FDA under an Emergency Use Authorization (EUA). This EUA will remain  in effect (meaning this test can be used) for the duration of the COVID-19 declaration under Section 564(b)(1) of the Act, 21 U.S.C. section 360bbb-3(b)(1), unless the authorization is terminated or revoked sooner.     Influenza A by PCR NEGATIVE NEGATIVE Final   Influenza B by PCR NEGATIVE NEGATIVE Final    Comment: (NOTE) The Xpert Xpress SARS-CoV-2/FLU/RSV assay is intended as an aid in  the diagnosis of influenza from Nasopharyngeal swab specimens and  should not be used as a sole basis for treatment. Nasal washings and  aspirates are unacceptable for Xpert Xpress SARS-CoV-2/FLU/RSV  testing.  Fact Sheet for Patients: PinkCheek.be  Fact Sheet for Healthcare Providers: GravelBags.it  This test is not yet approved or cleared by the [place] FDA and  has been authorized for detection and/or diagnosis of SARS-CoV-2 by  FDA under  an Emergency Use Authorization (EUA). This EUA will remain  in effect (meaning this test can be used) for the duration of the  Covid-19 declaration under Section 564(b)(1) of the Act, 21  U.S.C. section 360bbb-3(b)(1), unless the authorization is  terminated or revoked. Performed at [facility], [place] [address] [place], [place] [address]          Radiology Studies: CT Head Wo Contrast  Result Date: 02/10/2020 CLINICAL DATA:  Dizziness.  Mental status change. EXAM: CT HEAD WITHOUT CONTRAST TECHNIQUE: Contiguous axial images were obtained from the base of the skull through the vertex without intravenous contrast. COMPARISON:  None. FINDINGS: The  study is mildly motion degraded. Brain: No acute large territory infarct, intracranial hemorrhage, mass, midline shift, or extra-axial fluid collection is identified. Lacunar infarcts in the left basal ganglia and left thalamus are likely chronic. Hypodensities in the cerebral white matter bilaterally are nonspecific but compatible with moderate chronic small vessel ischemic disease. A small age indeterminate cortical infarct is questioned in the left parietal lobe although this is in a region of motion and may be artifactual. There is mild lateral and third ventriculomegaly which may reflect central predominant cerebral atrophy with hydrocephalus considered less likely. Vascular: Calcified atherosclerosis at the skull base. No hyperdense vessel. Skull: No fracture or suspicious osseous lesion. Sinuses/Orbits: Visualized paranasal sinuses and mastoid air cells are clear. Bilateral cataract extraction. Other: Likely iatrogenic venous gas in the right frontotemporal scalp. IMPRESSION: 1. No evidence of acute large territory infarct or intracranial hemorrhage. 2. Small age indeterminate left parietal cortical infarct versus artifact. 3. Moderate chronic small vessel ischemic disease. Electronically Signed   By: Logan Bores M.D.   On:  02/10/2020 21:32   CT Angio Chest PE W and/or Wo Contrast  Result Date: 02/10/2020 CLINICAL DATA:  86 year old with shortness of breath. New onset atrial fibrillation. Shortness of breath for 3 days. Low back pain. Weakness. Nausea. EXAM: CT ANGIOGRAPHY CHEST WITH CONTRAST TECHNIQUE: Multidetector CT imaging of the chest was performed using the standard protocol during bolus administration of intravenous contrast. Multiplanar CT image reconstructions and MIPs were obtained to evaluate the vascular anatomy. Performed in conjunction with CT of the abdomen and pelvis, reported separately. CONTRAST:  192mL OMNIPAQUE IOHEXOL 350 MG/ML SOLN COMPARISON:  No recent exams.  Chest CT 02/14/2012. FINDINGS: Cardiovascular: Positive for acute pulmonary embolus with filling defect in the segmental branches of the left lower lobe, series 9 image 69, and possibly a few subsegmental branches in the right lower lobe, series 9 image 71. Thromboembolic burden is small. Probable contrast mixing in the left atrial appendage. Mild right heart dilatation as well as biatrial enlargement. Contrast refluxes into the hepatic veins and IVC. There are coronary artery calcifications. Dense aortic atherosclerosis. No aortic aneurysm. Mediastinum/Nodes: No mediastinal or hilar adenopathy. No esophageal wall thickening. No visualized thyroid nodule. Lungs/Pleura: Mild biapical pleuroparenchymal scarring. Breathing motion artifact limits detailed parenchymal assessment. Small bilateral pleural effusions with minimal adjacent atelectasis. Subsegmental opacities in the periphery of the right lower and middle lobe favor atelectasis. There is an 8 mm ground-glass nodule at the right lung apex, series 7, image 29. No evidence of tracheal or bronchial lesions, motion partially obscures detailed assessment. No septal thickening or convincing pulmonary edema Upper Abdomen: Assessed on concurrent abdominal CT. Contrast refluxes into the hepatic veins and  IVC. Musculoskeletal: There are no acute or suspicious osseous abnormalities. Review of the MIP images confirms the above findings. IMPRESSION: 1. Positive for acute pulmonary embolus in the segmental branches of the left lower lobe and possibly a few subsegmental branches in the right lower lobe. Thromboembolic burden is small. 2. Mild right heart dilatation as well as biatrial enlargement. Contrast refluxes into the hepatic veins and IVC, consistent with elevated right heart pressures. 3. Small bilateral pleural effusions with minimal adjacent atelectasis. 4. Right upper lobe 8 mm ground-glass nodule. Initial follow-up with CT at 6-12 months is recommended to confirm persistence. If persistent, repeat CT is recommended every 2 years until 5 years of stability has been established, giving consideration for patient's advanced age. This recommendation follows the consensus statement: Guidelines for Management of Incidental Pulmonary Nodules  Detected on CT Images: From the Fleischner Society 2017; Radiology 2017; [phone]. Aortic Atherosclerosis (ICD10-I70.0). Critical Value/emergent results were called by telephone at the time of interpretation on 02/10/2020 at 9:35 pm to Dr Shirlyn Goltz , who verbally acknowledged these results. Electronically Signed   By: Keith Rake M.D.   On: 02/10/2020 21:36   CT ABDOMEN PELVIS W CONTRAST  Result Date: 02/10/2020 CLINICAL DATA:  Abdominal distension. Low back pain. Weakness. Nausea. EXAM: CT ABDOMEN AND PELVIS WITH CONTRAST TECHNIQUE: Multidetector CT imaging of the abdomen and pelvis was performed using the standard protocol following bolus administration of intravenous contrast. CONTRAST:  179mL OMNIPAQUE IOHEXOL 350 MG/ML SOLN COMPARISON:  None. FINDINGS: Lower chest: Assessed on concurrent chest CT, reported separately. Small bilateral pleural effusions. Hepatobiliary: Motion artifact limitations. No focal liver abnormality is seen. Gallbladder is not well-defined on  the current exam, may be completely decompressed or surgically absent. There is no biliary dilatation. Pancreas: Motion limited evaluation. No evidence of peripancreatic inflammation. No ductal dilatation. No evidence of pancreatic mass. Spleen: Normal in size without focal abnormality. Adrenals/Urinary Tract: Normal adrenal glands. No hydronephrosis or perinephric edema. Simple cyst in the upper left kidney. Subcentimeter cortical hypodensity in the mid anterior right kidney is too small to characterize. Urinary bladder is completely empty. Stomach/Bowel: Bowel evaluation is limited due to motion and absence of enteric contrast. No abnormal gastric distension. Small duodenal diverticulum without inflammation. There is no bowel dilatation to suggest obstruction. No evidence of bowel inflammation. Colonic diverticulosis is prominent in the sigmoid colon. There is no diverticulitis or pericolonic edema. Normal appendix. Vascular/Lymphatic: Prominent aortic and branch atherosclerosis. No aortic aneurysm. The portal vein is patent. No abdominopelvic adenopathy. Reproductive: Status post hysterectomy. No adnexal masses. Other: No free air or ascites.  No body wall hernia. Musculoskeletal: Multilevel degenerative change throughout the lumbar spine with near complete disc space loss at L2-L3, L3-L4, and L4-L5. There is multilevel facet hypertrophy. Bones are under mineralized. No acute osseous findings. No compression fracture. IMPRESSION: 1. No acute abnormality in the abdomen/pelvis, allowing for motion artifact. 2. Colonic diverticulosis without diverticulitis. 3. Multilevel degenerative change throughout the lumbar spine. Aortic Atherosclerosis (ICD10-I70.0). Electronically Signed   By: Keith Rake M.D.   On: 02/10/2020 21:40        Scheduled Meds: . isosorbide mononitrate  15 mg Oral Daily  . levothyroxine  100 mcg Oral Q0600  . mirabegron ER  50 mg Oral QPM  . simvastatin  20 mg Oral QHS  . sodium  chloride flush  3 mL Intravenous Q12H  . sodium chloride flush  3 mL Intravenous Q12H  . traZODone  50 mg Oral QHS  . trimethoprim  100 mg Oral Daily   Continuous Infusions: . sodium chloride    . heparin 800 Units/hr (02/10/20 2315)     LOS: 0 days    Time spent:40 min    Ellayna Hilligoss, Geraldo Docker, MD [facility] Pager [phone]  If 7PM-7AM, please contact night-coverage [email or web address] Password [facility] 02/11/2020, 7:38 AM

## 2020-02-11 NOTE — Progress Notes (Signed)
Bilateral lower extremity venous duplex has been completed. Preliminary results can be found in CV Proc through chart review.   02/11/20 3:22 PM Carlos Levering RVT

## 2020-02-12 LAB — CBC
HCT: 33.5 % — ABNORMAL LOW (ref 36.0–46.0)
Hemoglobin: 10.3 g/dL — ABNORMAL LOW (ref 12.0–15.0)
MCH: 32.5 pg (ref 26.0–34.0)
MCHC: 30.7 g/dL (ref 30.0–36.0)
MCV: 105.7 fL — ABNORMAL HIGH (ref 80.0–100.0)
Platelets: 153 10*3/uL (ref 150–400)
RBC: 3.17 MIL/uL — ABNORMAL LOW (ref 3.87–5.11)
RDW: 12.6 % (ref 11.5–15.5)
WBC: 8.2 10*3/uL (ref 4.0–10.5)
nRBC: 0 % (ref 0.0–0.2)

## 2020-02-12 LAB — BASIC METABOLIC PANEL
Anion gap: 7 (ref 5–15)
BUN: 14 mg/dL (ref 8–23)
CO2: 25 mmol/L (ref 22–32)
Calcium: 8.4 mg/dL — ABNORMAL LOW (ref 8.9–10.3)
Chloride: 107 mmol/L (ref 98–111)
Creatinine, Ser: 0.91 mg/dL (ref 0.44–1.00)
GFR, Estimated: 57 mL/min — ABNORMAL LOW (ref >60)
Glucose, Bld: 119 mg/dL — ABNORMAL HIGH (ref 70–99)
Potassium: 4.5 mmol/L (ref 3.5–5.1)
Sodium: 139 mmol/L (ref 135–145)

## 2020-02-12 LAB — MAGNESIUM: Magnesium: 2.2 mg/dL (ref 1.7–2.4)

## 2020-02-12 LAB — T3, FREE: T3, Free: 2.2 pg/mL (ref 2.0–4.4)

## 2020-02-12 LAB — PHOSPHORUS: Phosphorus: 3.3 mg/dL (ref 2.5–4.6)

## 2020-02-12 LAB — HEPARIN LEVEL (UNFRACTIONATED): Heparin Unfractionated: 0.31 IU/mL (ref 0.30–0.70)

## 2020-02-12 MED ORDER — DILTIAZEM HCL 25 MG/5ML IV SOLN
5.0000 mg | Freq: Once | INTRAVENOUS | Status: DC
  Filled 2020-02-12: qty 5

## 2020-02-12 MED ORDER — DILTIAZEM HCL 25 MG/5ML IV SOLN
5.0000 mg | Freq: Once | INTRAVENOUS | Status: AC
  Administered 2020-02-12: 5 mg via INTRAVENOUS
  Filled 2020-02-12: qty 5

## 2020-02-12 MED ORDER — METOPROLOL TARTRATE 50 MG PO TABS
50.0000 mg | ORAL_TABLET | Freq: Two times a day (BID) | ORAL | Status: DC
  Administered 2020-02-12 (×2): 50 mg via ORAL
  Filled 2020-02-12 (×2): qty 1

## 2020-02-12 MED ORDER — METOPROLOL TARTRATE 5 MG/5ML IV SOLN
5.0000 mg | INTRAVENOUS | Status: AC | PRN
  Administered 2020-02-12: 5 mg via INTRAVENOUS
  Filled 2020-02-12: qty 5

## 2020-02-12 MED ORDER — APIXABAN 5 MG PO TABS
5.0000 mg | ORAL_TABLET | Freq: Two times a day (BID) | ORAL | Status: DC
  Administered 2020-02-12 – 2020-02-18 (×12): 5 mg via ORAL
  Filled 2020-02-12 (×13): qty 1

## 2020-02-12 NOTE — Progress Notes (Signed)
   02/12/20 0153  Assess: MEWS Score  BP 127/74  Pulse Rate (!) 112  Resp 20  SpO2 93 %  O2 Device Nasal Cannula  O2 Flow Rate (L/min) 3 L/min  Assess: MEWS Score  MEWS Temp 0  MEWS Systolic 0  MEWS Pulse 2  MEWS RR 0  MEWS LOC 0  MEWS Score 2  MEWS Score Color Yellow  Assess: if the MEWS score is Yellow or Red  Were vital signs taken at a resting state? Yes  Focused Assessment Change from prior assessment (see assessment flowsheet)  Early Detection of Sepsis Score *See Row Information* Low  MEWS guidelines implemented *See Row Information* Yes  Treat  MEWS Interventions Administered scheduled meds/treatments  Take Vital Signs  Increase Vital Sign Frequency  Yellow: Q 2hr X 2 then Q 4hr X 2, if remains yellow, continue Q 4hrs  Escalate  MEWS: Escalate Yellow: discuss with charge nurse/RN and consider discussing with provider and RRT  Notify: Charge Nurse/RN  Name of Charge Nurse/RN Notified Kelby Aline.  Date Charge Nurse/RN Notified 02/12/20  Time Charge Nurse/RN Notified [id number]  Notify: Provider  Provider Name/Title Sharlet Salina  Date Provider Notified 02/12/20  Time Provider Notified [phone]  Notification Type Page  Notification Reason Change in status  Metoprolol IV 5 mg ordered and given

## 2020-02-12 NOTE — Plan of Care (Signed)
°  Problem: Clinical Measurements: °Goal: Ability to maintain clinical measurements within normal limits will improve °Outcome: Progressing °Goal: Will remain free from infection °Outcome: Progressing °Goal: Diagnostic test results will improve °Outcome: Progressing °Goal: Respiratory complications will improve °Outcome: Progressing °  °

## 2020-02-12 NOTE — Progress Notes (Addendum)
ANTICOAGULATION CONSULT NOTE  Pharmacy Consult for heparin Indication: pulmonary embolus, atrial fibrillation   No Known Allergies  Patient Measurements: Height: 5\' 5"  (165.1 cm) Weight: 57.6 kg (126 lb 15.8 oz) IBW/kg (Calculated) : 57 Heparin Dosing Weight: actual body weight   Vital Signs: Temp: 98.4 F (36.9 C) (11/03 0609) Temp Source: Oral (11/03 0609) BP: 118/66 (11/03 0609) Pulse Rate: 124 (11/03 0609)  Labs: Recent Labs    02/10/20 1836 02/10/20 1836 02/10/20 2318 02/11/20 0723 02/11/20 1411 02/12/20 0519 02/12/20 0557  HGB 11.7*   < >  --  11.3*  --  10.3*  --   HCT 36.9  --   --  35.8*  --  33.5*  --   PLT 182  --   --  165  --  153  --   APTT  --   --  102*  --   --   --   --   LABPROT  --   --  14.1  --   --   --   --   INR  --   --  1.1  --   --   --   --   HEPARINUNFRC  --   --   --  0.35 0.34  --  0.31  CREATININE 0.97  --   --  0.90  --  0.91  --   TROPONINIHS 8  --   --   --   --   --   --    < > = values in this interval not displayed.    Estimated Creatinine Clearance: 31.8 mL/min (by C-G formula based on SCr of 0.91 mg/dL).   Medical History: Past Medical History:  Diagnosis Date  . CAD (coronary artery disease)    a. 07/2007 PCI/Promus DES to LAD;  b. 02/2010 PCI/Promus DES to dRCA.;  c. 12/2010 Lexi MV: EF 88%, no isch/infarct.;  d. 02/2012 Cath: LM nl, LAD 20p, patent mid stent, 60d, D1 sm 70ost, D2 sm, diff, LCX sm , OM1 diff 57m (no change), RCA 40ost, patent stent, PD/PL nl, EF 75% w/o rwma's.  . Carotid bruit   . Diverticulosis   . GERD (gastroesophageal reflux disease)   . Hemorrhoids   . History of echocardiogram    a. 06/2012 Echo: EF nl, mild LVH.  Marland Kitchen HLD (hyperlipidemia)   . HTN (hypertension)   . Hypothyroidism   . IBS (irritable bowel syndrome)       Assessment: 86 y/o female presenting to UC with family member with reports of intermittent SOB, generalized weakness and lower back pain. CTa chest + for PE. New atrial  fibrillation also noted in the ED. Pharmacy consulted to for IV heparin dosing. No prior AC noted.  Today, 02/12/20:   HL 0.31 therapeutic but at low end of goal range  CBC: Hgb 10.3 down slightly. Pltc WNL.   No known bleeding or infusion issues noted per nursing    Goal of Therapy:  Heparin level 0.3-0.7 units/ml Monitor platelets by anticoagulation protocol: yes   Plan:   Increase heparin infusion slightly to 850 units/hr  Daily CBC, heparin level  Monitor closely for s/sx of bleeding   Ulice Dash, PharmD, [place] [phone] 02/12/2020, 8:18 AM  Addendum 02/12/20 9:23 AM  Plan to transition to Eliquis this AM. Discussed bypassing the loading phase due to age with TRH. Begin Eliquis 5 mg bid and stop heparin infusion with first dose. Will need to transition to lower dose 2.5  mg bid once PE course completed due to age and weight.   Ulice Dash, PharmD, [place] [phone] 02/12/20 9:25 AM

## 2020-02-12 NOTE — Progress Notes (Signed)
Patient ID: [name], female   DOB: 05-02-1920, 86 y.o.   MRN: [id number]  PROGRESS NOTE    [name]  NTI:[id number] DOB: November 29, 1920 DOA: 02/10/2020 PCP: Holland Commons, FNP   Brief Narrative:  86 year old female with history of hypertension, CAD, chronic diastolic CHF, hypothyroidism and recurrent UTI presented with shortness of breath, aches and malaise.  On presentation, she was saturating in the mid to upper 80s on room air, tachycardic in the 110s, blood pressure was stable.  EKG showed atrial fibrillation.  CT of the head was negative for any acute intracranial abnormality.  CT of the abdomen and pelvis showed no acute findings.  CTA chest showed acute pulmonary embolism and right upper lobe nodule.  COVID-19 test was negative.  She was started on IV heparin.  Assessment & Plan:   Acute PE Acute hypoxic respiratory failure -CTA of the chest showed acute pulmonary embolism.  Currently on IV heparin.  Will switch to oral Eliquis. -Lower extremity duplex did not show any evidence of lower extremity DVT.  Echo showed EF of 60 to 65% with moderate MR -Still requiring 3 L oxygen via nasal cannula.  Wean off as able  Chronic diastolic CHF -Currently compensated.  Restart metoprolol.  Strict input and output.  Daily weights. -Outpatient follow-up with cardiology  Paroxysmal A. fib with RVR -Likely precipitated by PE.  Still very tachycardic.  Resume home metoprolol 50 mg twice a day.  Outpatient follow-up with cardiology.  Anticoagulant plan as above.  Essential hypertension -Blood pressure improving.  Resume metoprolol as above  Hypothyroidism -Continue Synthroid  Lung nodule -Incidentally noted on CT on presentation.  Outpatient follow-up  Generalized deconditioning -PT eval.  Palliative care consultation for goals of care discussion   DVT prophylaxis: Heparin drip.  Switch to Eliquis Code Status: DNR Family Communication: None at bedside Disposition  Plan: Status is: Inpatient  Remains inpatient appropriate because:Inpatient level of care appropriate due to severity of illness   Dispo: The patient is from: Home              Anticipated d/c is to: Home              Anticipated d/c date is: 2 days              Patient currently is not medically stable to d/c.  Consultants: None  Procedures: Echo/lower extremity duplex  Antimicrobials: None   Subjective: Patient seen and examined at bedside.  She is sleepy, wakes up only very slightly, hardly answers any questions.  No overnight fever, vomiting, seizures reported.  Objective: Vitals:   02/12/20 0404 02/12/20 0409 02/12/20 0609 02/12/20 0957  BP: 109/76  118/66 140/74  Pulse: (!) 118  (!) 124 (!) 107  Resp: (!) 22  20 18   Temp: 98 F (36.7 C)  98.4 F (36.9 C) 98.8 F (37.1 C)  TempSrc: Oral  Oral Oral  SpO2: 94%  93% 95%  Weight:  57.6 kg    Height:        Intake/Output Summary (Last 24 hours) at 02/12/2020 1156 Last data filed at 02/12/2020 1049 Gross per 24 hour  Intake 398.75 ml  Output 350 ml  Net 48.75 ml   Filed Weights   02/10/20 1739 02/10/20 2335 02/12/20 0409  Weight: 55.3 kg 58.1 kg 57.6 kg    Examination:  General exam: Elderly female.  Lying in bed.  No acute distress Respiratory system: Bilateral decreased breath sounds at bases with scattered crackles  Cardiovascular system: S1 & S2 heard; tachycardic Gastrointestinal system: Abdomen is nondistended, soft and nontender. Normal bowel sounds heard. Extremities: No cyanosis, clubbing, edema  Central nervous system: sleepy, wakes up only very slightly, hardly answers any questions. No focal neurological deficits. Moving extremities Skin: No rashes, lesions or ulcers Psychiatry: Poor historian.  Cannot be assessed because of patient being very poor historian.    Data Reviewed: I have personally reviewed following labs and imaging studies  CBC: Recent Labs  Lab 02/10/20 1836 02/11/20 0723  02/12/20 0519  WBC 10.5 8.5 8.2  NEUTROABS 6.9  --   --   HGB 11.7* 11.3* 10.3*  HCT 36.9 35.8* 33.5*  MCV 103.4* 103.8* 105.7*  PLT 182 165 734   Basic Metabolic Panel: Recent Labs  Lab 02/10/20 1836 02/11/20 0723 02/12/20 0519  NA 141 138 139  K 4.2 4.4 4.5  CL 103 105 107  CO2 26 25 25   GLUCOSE 120* 100* 119*  BUN 18 16 14   CREATININE 0.97 0.90 0.91  CALCIUM 9.3 8.6* 8.4*  MG  --  2.4 2.2  PHOS  --   --  3.3   GFR: Estimated Creatinine Clearance: 31.8 mL/min (by C-G formula based on SCr of 0.91 mg/dL). Liver Function Tests: Recent Labs  Lab 02/10/20 1836  AST 19  ALT 13  ALKPHOS 64  BILITOT 1.0  PROT 7.7  ALBUMIN 3.9   No results for input(s): LIPASE, AMYLASE in the last 168 hours. No results for input(s): AMMONIA in the last 168 hours. Coagulation Profile: Recent Labs  Lab 02/10/20 2318  INR 1.1   Cardiac Enzymes: No results for input(s): CKTOTAL, CKMB, CKMBINDEX, TROPONINI in the last 168 hours. BNP (last 3 results) No results for input(s): PROBNP in the last 8760 hours. HbA1C: No results for input(s): HGBA1C in the last 72 hours. CBG: No results for input(s): GLUCAP in the last 168 hours. Lipid Profile: No results for input(s): CHOL, HDL, LDLCALC, TRIG, CHOLHDL, LDLDIRECT in the last 72 hours. Thyroid Function Tests: Recent Labs    02/11/20 0723 02/11/20 1411  TSH 0.098*  --   FREET4  --  1.51*  T3FREE  --  2.2   Anemia Panel: No results for input(s): VITAMINB12, FOLATE, FERRITIN, TIBC, IRON, RETICCTPCT in the last 72 hours. Sepsis Labs: No results for input(s): PROCALCITON, LATICACIDVEN in the last 168 hours.  Recent Results (from the past 240 hour(s))  Urine Culture     Status: None   Collection Time: 02/10/20  6:00 PM   Specimen: Urine, Random  Result Value Ref Range Status   Specimen Description   Final    URINE, RANDOM Performed at [facility] [address] [place], [place] [address]    Special Requests    Final    NONE Performed at [facility], [place] [address] [place], [place] [address]    Culture   Final    NO GROWTH Performed at [facility], [place] [address], [place], [place] [address]    Report Status 02/11/2020 FINAL  Final  Respiratory Panel by RT PCR (Flu A&B, Covid) - Nasopharyngeal Swab     Status: None   Collection Time: 02/10/20  6:00 PM   Specimen: Nasopharyngeal Swab  Result Value Ref Range Status   SARS Coronavirus 2 by RT PCR NEGATIVE NEGATIVE Final    Comment: (NOTE) SARS-CoV-2 target nucleic acids are NOT DETECTED.  The SARS-CoV-2 RNA is generally detectable in upper respiratoy specimens during the  acute phase of infection. The lowest concentration of SARS-CoV-2 viral copies this assay can detect is 131 copies/mL. A negative result does not preclude SARS-Cov-2 infection and should not be used as the sole basis for treatment or other patient management decisions. A negative result may occur with  improper specimen collection/handling, submission of specimen other than nasopharyngeal swab, presence of viral mutation(s) within the areas targeted by this assay, and inadequate number of viral copies (<131 copies/mL). A negative result must be combined with clinical observations, patient history, and epidemiological information. The expected result is Negative.  Fact Sheet for Patients:  PinkCheek.be  Fact Sheet for Healthcare Providers:  GravelBags.it  This test is no t yet approved or cleared by the [place] FDA and  has been authorized for detection and/or diagnosis of SARS-CoV-2 by FDA under an Emergency Use Authorization (EUA). This EUA will remain  in effect (meaning this test can be used) for the duration of the COVID-19 declaration under Section 564(b)(1) of the Act, 21 U.S.C. section 360bbb-3(b)(1), unless the authorization is terminated or revoked sooner.      Influenza A by PCR NEGATIVE NEGATIVE Final   Influenza B by PCR NEGATIVE NEGATIVE Final    Comment: (NOTE) The Xpert Xpress SARS-CoV-2/FLU/RSV assay is intended as an aid in  the diagnosis of influenza from Nasopharyngeal swab specimens and  should not be used as a sole basis for treatment. Nasal washings and  aspirates are unacceptable for Xpert Xpress SARS-CoV-2/FLU/RSV  testing.  Fact Sheet for Patients: PinkCheek.be  Fact Sheet for Healthcare Providers: GravelBags.it  This test is not yet approved or cleared by the [place] FDA and  has been authorized for detection and/or diagnosis of SARS-CoV-2 by  FDA under an Emergency Use Authorization (EUA). This EUA will remain  in effect (meaning this test can be used) for the duration of the  Covid-19 declaration under Section 564(b)(1) of the Act, 21  U.S.C. section 360bbb-3(b)(1), unless the authorization is  terminated or revoked. Performed at [facility], [place] [address], [place], [place] [address]          Radiology Studies: CT Head Wo Contrast  Result Date: 02/10/2020 CLINICAL DATA:  Dizziness.  Mental status change. EXAM: CT HEAD WITHOUT CONTRAST TECHNIQUE: Contiguous axial images were obtained from the base of the skull through the vertex without intravenous contrast. COMPARISON:  None. FINDINGS: The study is mildly motion degraded. Brain: No acute large territory infarct, intracranial hemorrhage, mass, midline shift, or extra-axial fluid collection is identified. Lacunar infarcts in the left basal ganglia and left thalamus are likely chronic. Hypodensities in the cerebral white matter bilaterally are nonspecific but compatible with moderate chronic small vessel ischemic disease. A small age indeterminate cortical infarct is questioned in the left parietal lobe although this is in a region of motion and may be artifactual. There is mild lateral and  third ventriculomegaly which may reflect central predominant cerebral atrophy with hydrocephalus considered less likely. Vascular: Calcified atherosclerosis at the skull base. No hyperdense vessel. Skull: No fracture or suspicious osseous lesion. Sinuses/Orbits: Visualized paranasal sinuses and mastoid air cells are clear. Bilateral cataract extraction. Other: Likely iatrogenic venous gas in the right frontotemporal scalp. IMPRESSION: 1. No evidence of acute large territory infarct or intracranial hemorrhage. 2. Small age indeterminate left parietal cortical infarct versus artifact. 3. Moderate chronic small vessel ischemic disease. Electronically Signed   By: Logan Bores M.D.   On: 02/10/2020 21:32   CT Angio Chest PE W and/or Wo  Contrast  Result Date: 02/10/2020 CLINICAL DATA:  86 year old with shortness of breath. New onset atrial fibrillation. Shortness of breath for 3 days. Low back pain. Weakness. Nausea. EXAM: CT ANGIOGRAPHY CHEST WITH CONTRAST TECHNIQUE: Multidetector CT imaging of the chest was performed using the standard protocol during bolus administration of intravenous contrast. Multiplanar CT image reconstructions and MIPs were obtained to evaluate the vascular anatomy. Performed in conjunction with CT of the abdomen and pelvis, reported separately. CONTRAST:  145mL OMNIPAQUE IOHEXOL 350 MG/ML SOLN COMPARISON:  No recent exams.  Chest CT 02/14/2012. FINDINGS: Cardiovascular: Positive for acute pulmonary embolus with filling defect in the segmental branches of the left lower lobe, series 9 image 69, and possibly a few subsegmental branches in the right lower lobe, series 9 image 71. Thromboembolic burden is small. Probable contrast mixing in the left atrial appendage. Mild right heart dilatation as well as biatrial enlargement. Contrast refluxes into the hepatic veins and IVC. There are coronary artery calcifications. Dense aortic atherosclerosis. No aortic aneurysm. Mediastinum/Nodes: No  mediastinal or hilar adenopathy. No esophageal wall thickening. No visualized thyroid nodule. Lungs/Pleura: Mild biapical pleuroparenchymal scarring. Breathing motion artifact limits detailed parenchymal assessment. Small bilateral pleural effusions with minimal adjacent atelectasis. Subsegmental opacities in the periphery of the right lower and middle lobe favor atelectasis. There is an 8 mm ground-glass nodule at the right lung apex, series 7, image 29. No evidence of tracheal or bronchial lesions, motion partially obscures detailed assessment. No septal thickening or convincing pulmonary edema Upper Abdomen: Assessed on concurrent abdominal CT. Contrast refluxes into the hepatic veins and IVC. Musculoskeletal: There are no acute or suspicious osseous abnormalities. Review of the MIP images confirms the above findings. IMPRESSION: 1. Positive for acute pulmonary embolus in the segmental branches of the left lower lobe and possibly a few subsegmental branches in the right lower lobe. Thromboembolic burden is small. 2. Mild right heart dilatation as well as biatrial enlargement. Contrast refluxes into the hepatic veins and IVC, consistent with elevated right heart pressures. 3. Small bilateral pleural effusions with minimal adjacent atelectasis. 4. Right upper lobe 8 mm ground-glass nodule. Initial follow-up with CT at 6-12 months is recommended to confirm persistence. If persistent, repeat CT is recommended every 2 years until 5 years of stability has been established, giving consideration for patient's advanced age. This recommendation follows the consensus statement: Guidelines for Management of Incidental Pulmonary Nodules Detected on CT Images: From the Fleischner Society 2017; Radiology 2017; 284:228-243. Aortic Atherosclerosis (ICD10-I70.0). Critical Value/emergent results were called by telephone at the time of interpretation on 02/10/2020 at 9:35 pm to Dr Shirlyn Goltz , who verbally acknowledged these results.  Electronically Signed   By: Keith Rake M.D.   On: 02/10/2020 21:36   CT ABDOMEN PELVIS W CONTRAST  Result Date: 02/10/2020 CLINICAL DATA:  Abdominal distension. Low back pain. Weakness. Nausea. EXAM: CT ABDOMEN AND PELVIS WITH CONTRAST TECHNIQUE: Multidetector CT imaging of the abdomen and pelvis was performed using the standard protocol following bolus administration of intravenous contrast. CONTRAST:  173mL OMNIPAQUE IOHEXOL 350 MG/ML SOLN COMPARISON:  None. FINDINGS: Lower chest: Assessed on concurrent chest CT, reported separately. Small bilateral pleural effusions. Hepatobiliary: Motion artifact limitations. No focal liver abnormality is seen. Gallbladder is not well-defined on the current exam, may be completely decompressed or surgically absent. There is no biliary dilatation. Pancreas: Motion limited evaluation. No evidence of peripancreatic inflammation. No ductal dilatation. No evidence of pancreatic mass. Spleen: Normal in size without focal abnormality. Adrenals/Urinary Tract: Normal adrenal glands.  No hydronephrosis or perinephric edema. Simple cyst in the upper left kidney. Subcentimeter cortical hypodensity in the mid anterior right kidney is too small to characterize. Urinary bladder is completely empty. Stomach/Bowel: Bowel evaluation is limited due to motion and absence of enteric contrast. No abnormal gastric distension. Small duodenal diverticulum without inflammation. There is no bowel dilatation to suggest obstruction. No evidence of bowel inflammation. Colonic diverticulosis is prominent in the sigmoid colon. There is no diverticulitis or pericolonic edema. Normal appendix. Vascular/Lymphatic: Prominent aortic and branch atherosclerosis. No aortic aneurysm. The portal vein is patent. No abdominopelvic adenopathy. Reproductive: Status post hysterectomy. No adnexal masses. Other: No free air or ascites.  No body wall hernia. Musculoskeletal: Multilevel degenerative change throughout  the lumbar spine with near complete disc space loss at L2-L3, L3-L4, and L4-L5. There is multilevel facet hypertrophy. Bones are under mineralized. No acute osseous findings. No compression fracture. IMPRESSION: 1. No acute abnormality in the abdomen/pelvis, allowing for motion artifact. 2. Colonic diverticulosis without diverticulitis. 3. Multilevel degenerative change throughout the lumbar spine. Aortic Atherosclerosis (ICD10-I70.0). Electronically Signed   By: Keith Rake M.D.   On: 02/10/2020 21:40   ECHOCARDIOGRAM COMPLETE  Result Date: 02/11/2020    ECHOCARDIOGRAM REPORT   Patient Name:   YELENA METZER [facility] Date of Exam: 02/11/2020 Medical Rec #:  [id number]      Height:       65.0 in Accession #:    [id number]     Weight:       128.1 lb Date of Birth:  05-Sep-1920      BSA:          1.637 m Patient Age:    29 years       BP:           105/77 mmHg Patient Gender: F              HR:           120 bpm. Exam Location:  Inpatient Procedure: 2D Echo, Cardiac Doppler and Color Doppler Indications:    Pulmonary Embolus, Atrial fibrillation  History:        Patient has prior history of Echocardiogram examinations, most                 recent 03/30/2016. CHF, CAD, Arrythmias:Atrial Fibrillation,                 Signs/Symptoms:Shortness of Breath; Risk Factors:Hypertension.  Sonographer:    Dustin Flock Referring Phys: [phone] [place]  1. Left ventricular ejection fraction, by estimation, is 60 to 65%. The left ventricle has normal function. The left ventricle has no regional wall motion abnormalities. There is mild left ventricular hypertrophy. Left ventricular diastolic function could not be evaluated.  2. Right ventricular systolic function is normal. The right ventricular size is normal. There is moderately elevated pulmonary artery systolic pressure. The estimated right ventricular systolic pressure is 02.5 mmHg.  3. The mitral valve is abnormal. Moderate mitral valve regurgitation.  4.  The aortic valve is tricuspid. Aortic valve regurgitation is not visualized. Mild aortic valve sclerosis is present, with no evidence of aortic valve stenosis.  5. The inferior vena cava is normal in size with <50% respiratory variability, suggesting right atrial pressure of 8 mmHg. Comparison(s): Prior images unable to be directly viewed, comparison made by report only. 03/30/2016: LVEF 65-70%, grade 1 DD with high LV filling pressure, trivial MR, RVSP 28 mmHg. FINDINGS  Left Ventricle: Left ventricular ejection fraction, by  estimation, is 60 to 65%. The left ventricle has normal function. The left ventricle has no regional wall motion abnormalities. The left ventricular internal cavity size was normal in size. There is  mild left ventricular hypertrophy. Left ventricular diastolic function could not be evaluated due to atrial fibrillation. Left ventricular diastolic function could not be evaluated. Right Ventricle: The right ventricular size is normal. No increase in right ventricular wall thickness. Right ventricular systolic function is normal. There is moderately elevated pulmonary artery systolic pressure. The tricuspid regurgitant velocity is 3.41 m/s, and with an assumed right atrial pressure of 8 mmHg, the estimated right ventricular systolic pressure is 56.3 mmHg. Left Atrium: Left atrial size was normal in size. Right Atrium: Right atrial size was normal in size. Pericardium: There is no evidence of pericardial effusion. Mitral Valve: The mitral valve is abnormal. There is mild thickening of the mitral valve leaflet(s). Mild to moderate mitral annular calcification. Moderate mitral valve regurgitation, with posteriorly-directed jet. Tricuspid Valve: The tricuspid valve is grossly normal. Tricuspid valve regurgitation is mild. Aortic Valve: The aortic valve is tricuspid. Aortic valve regurgitation is not visualized. Mild aortic valve sclerosis is present, with no evidence of aortic valve stenosis. Pulmonic  Valve: The pulmonic valve was grossly normal. Pulmonic valve regurgitation is trivial. Aorta: The aortic root and ascending aorta are structurally normal, with no evidence of dilitation. Venous: The inferior vena cava is normal in size with less than 50% respiratory variability, suggesting right atrial pressure of 8 mmHg. IAS/Shunts: No atrial level shunt detected by color flow Doppler.  LEFT VENTRICLE PLAX 2D LVIDd:         3.30 cm  Diastology LVIDs:         2.20 cm  LV e' medial:    6.20 cm/s LV PW:         1.10 cm  LV E/e' medial:  20.2 LV IVS:        1.10 cm  LV e' lateral:   9.90 cm/s LVOT diam:     1.90 cm  LV E/e' lateral: 12.6 LV SV:         46 LV SV Index:   28 LVOT Area:     2.84 cm  RIGHT VENTRICLE RV Basal diam:  2.90 cm RV S prime:     10.60 cm/s TAPSE (M-mode): 3.0 cm LEFT ATRIUM             Index       RIGHT ATRIUM           Index LA diam:        4.10 cm 2.50 cm/m  RA Area:     14.90 cm LA Vol (A2C):   42.6 ml 26.02 ml/m RA Volume:   32.70 ml  19.98 ml/m LA Vol (A4C):   52.9 ml 32.32 ml/m LA Biplane Vol: 47.8 ml 29.20 ml/m  AORTIC VALVE LVOT Vmax:   80.20 cm/s LVOT Vmean:  56.700 cm/s LVOT VTI:    0.163 m  AORTA Ao Root diam: 2.70 cm MITRAL VALVE                TRICUSPID VALVE MV Area (PHT): 3.08 cm     TR Peak grad:   46.5 mmHg MV Decel Time: 246 msec     TR Vmax:        341.00 cm/s MV E velocity: 125.00 cm/s  SHUNTS                             Systemic VTI:  0.16 m                             Systemic Diam: 1.90 cm Lyman Bishop MD Electronically signed by Lyman Bishop MD Signature Date/Time: 02/11/2020/10:36:48 AM    Final    VAS [place] LOWER EXTREMITY VENOUS (DVT)  Result Date: 02/11/2020  Lower Venous DVT Study Indications: Pulmonary embolism.  Risk Factors: None identified. Limitations: Poor ultrasound/tissue interface. Comparison Study: No prior studies. Performing Technologist: Oliver Hum RVT  Examination Guidelines: A complete evaluation includes B-mode  imaging, spectral Doppler, color Doppler, and power Doppler as needed of all accessible portions of each vessel. Bilateral testing is considered an integral part of a complete examination. Limited examinations for reoccurring indications may be performed as noted. The reflux portion of the exam is performed with the patient in reverse Trendelenburg.  +---------+---------------+---------+-----------+----------+--------------+ RIGHT    CompressibilityPhasicitySpontaneityPropertiesThrombus Aging +---------+---------------+---------+-----------+----------+--------------+ CFV      Full           Yes      Yes                                 +---------+---------------+---------+-----------+----------+--------------+ SFJ      Full                                                        +---------+---------------+---------+-----------+----------+--------------+ FV Prox  Full                                                        +---------+---------------+---------+-----------+----------+--------------+ FV Mid   Full                                                        +---------+---------------+---------+-----------+----------+--------------+ FV DistalFull                                                        +---------+---------------+---------+-----------+----------+--------------+ PFV      Full                                                        +---------+---------------+---------+-----------+----------+--------------+ POP      Full           Yes      Yes                                 +---------+---------------+---------+-----------+----------+--------------+  PTV      Full                                                        +---------+---------------+---------+-----------+----------+--------------+ PERO     Full                                                        +---------+---------------+---------+-----------+----------+--------------+    +---------+---------------+---------+-----------+----------+--------------+ LEFT     CompressibilityPhasicitySpontaneityPropertiesThrombus Aging +---------+---------------+---------+-----------+----------+--------------+ CFV      Full           Yes      Yes                                 +---------+---------------+---------+-----------+----------+--------------+ SFJ      Full                                                        +---------+---------------+---------+-----------+----------+--------------+ FV Prox  Full                                                        +---------+---------------+---------+-----------+----------+--------------+ FV Mid   Full                                                        +---------+---------------+---------+-----------+----------+--------------+ FV DistalFull                                                        +---------+---------------+---------+-----------+----------+--------------+ PFV      Full                                                        +---------+---------------+---------+-----------+----------+--------------+ POP      Full           Yes      Yes                                 +---------+---------------+---------+-----------+----------+--------------+ PTV      Full                                                        +---------+---------------+---------+-----------+----------+--------------+  PERO     Full                                                        +---------+---------------+---------+-----------+----------+--------------+     Summary: RIGHT: - There is no evidence of deep vein thrombosis in the lower extremity.  - No cystic structure found in the popliteal fossa.  LEFT: - There is no evidence of deep vein thrombosis in the lower extremity.  - No cystic structure found in the popliteal fossa.  *See table(s) above for measurements and observations. Electronically signed  by Deitra Mayo MD on 02/11/2020 at 5:52:53 PM.    Final         Scheduled Meds: . apixaban  5 mg Oral BID  . levothyroxine  100 mcg Oral Q0600  . metoprolol tartrate  50 mg Oral BID  . mirabegron ER  50 mg Oral QPM  . simvastatin  20 mg Oral QHS  . sodium chloride flush  3 mL Intravenous Q12H  . sodium chloride flush  3 mL Intravenous Q12H  . traZODone  50 mg Oral QHS  . trimethoprim  100 mg Oral Daily   Continuous Infusions: . sodium chloride            Aline August, MD [facility] 02/12/2020, 11:56 AM

## 2020-02-12 NOTE — Discharge Instructions (Signed)
Information on my medicine - ELIQUIS (apixaban)  This medication education was reviewed with me or my healthcare representative as part of my discharge preparation.  The pharmacist that spoke with me during my hospital stay was:   Why was Eliquis prescribed for you? Eliquis was prescribed to treat blood clots that may have been found in the veins of your legs (deep vein thrombosis) or in your lungs (pulmonary embolism) and to reduce the risk of them occurring again. AND Eliquis was prescribed for you to reduce the risk of a blood clot forming that can cause a stroke if you have a medical condition called atrial fibrillation (a type of irregular heartbeat).  What do You need to know about Eliquis ? Take your Eliquis TWICE DAILY - one tablet in the morning and one tablet in the evening with or without food. If you have difficulty swallowing the tablet whole please discuss with your pharmacist how to take the medication safely.  Try to take the dose about the same time in the morning and in the evening. If you have difficulty swallowing the tablet whole please discuss with your pharmacist how to take the medication safely.  Take Eliquis exactly as prescribed and DO NOT stop taking Eliquis without talking to the doctor who prescribed the medication.  Stopping may increase your risk of developing a new blood clot.  Refill your prescription before you run out.  After discharge, you should have regular check-up appointments with your healthcare provider that is prescribing your Eliquis.    What do you do if you miss a dose? If a dose of ELIQUIS is not taken at the scheduled time, take it as soon as possible on the same day and twice-daily administration should be resumed. The dose should not be doubled to make up for a missed dose.  Important Safety Information A possible side effect of Eliquis is bleeding. You should call your healthcare provider right away if you experience any of the  following: ? Bleeding from an injury or your nose that does not stop. ? Unusual colored urine (red or dark brown) or unusual colored stools (red or black). ? Unusual bruising for unknown reasons. ? A serious fall or if you hit your head (even if there is no bleeding).  Some medicines may interact with Eliquis and might increase your risk of bleeding or clotting while on Eliquis. To help avoid this, consult your healthcare provider or pharmacist prior to using any new prescription or non-prescription medications, including herbals, vitamins, non-steroidal anti-inflammatory drugs (NSAIDs) and supplements.  This website has more information on Eliquis (apixaban): http://www.eliquis.com/eliquis/home

## 2020-02-12 NOTE — Progress Notes (Signed)
PT Cancellation Note  Patient Details Name: [name] MRN: [id number] DOB: [birth year]   Cancelled Treatment:    Reason Eval/Treat Not Completed: Patient declined, no reason specified Pt refuses mobility, stating, "leave me alone."  Nurse tech reports pt has been up to [facility] today.  Per chart, pt from home with family assist.   Trena Platt 02/12/2020, 3:13 PM Arlyce Dice, DPT Acute Rehabilitation Services Pager: [phone] Office: [phone]

## 2020-02-12 NOTE — Progress Notes (Signed)
Notified by CCMD, HR sustaining to 130-140, EKG done, Afib with RVR, notified Sharlet Salina , APRN, cardiziem 1x dose ordered and given.

## 2020-02-13 LAB — BASIC METABOLIC PANEL
Anion gap: 9 (ref 5–15)
BUN: 17 mg/dL (ref 8–23)
CO2: 27 mmol/L (ref 22–32)
Calcium: 8.8 mg/dL — ABNORMAL LOW (ref 8.9–10.3)
Chloride: 102 mmol/L (ref 98–111)
Creatinine, Ser: 0.94 mg/dL (ref 0.44–1.00)
GFR, Estimated: 55 mL/min — ABNORMAL LOW (ref >60)
Glucose, Bld: 123 mg/dL — ABNORMAL HIGH (ref 70–99)
Potassium: 5 mmol/L (ref 3.5–5.1)
Sodium: 138 mmol/L (ref 135–145)

## 2020-02-13 LAB — CBC WITH DIFFERENTIAL/PLATELET
Abs Immature Granulocytes: 0.03 10*3/uL (ref 0.00–0.07)
Basophils Absolute: 0 10*3/uL (ref 0.0–0.1)
Basophils Relative: 0 %
Eosinophils Absolute: 0 10*3/uL (ref 0.0–0.5)
Eosinophils Relative: 0 %
HCT: 35.8 % — ABNORMAL LOW (ref 36.0–46.0)
Hemoglobin: 11 g/dL — ABNORMAL LOW (ref 12.0–15.0)
Immature Granulocytes: 0 %
Lymphocytes Relative: 17 %
Lymphs Abs: 1.5 10*3/uL (ref 0.7–4.0)
MCH: 32.5 pg (ref 26.0–34.0)
MCHC: 30.7 g/dL (ref 30.0–36.0)
MCV: 105.9 fL — ABNORMAL HIGH (ref 80.0–100.0)
Monocytes Absolute: 1.1 10*3/uL — ABNORMAL HIGH (ref 0.1–1.0)
Monocytes Relative: 13 %
Neutro Abs: 5.9 10*3/uL (ref 1.7–7.7)
Neutrophils Relative %: 70 %
Platelets: 167 10*3/uL (ref 150–400)
RBC: 3.38 MIL/uL — ABNORMAL LOW (ref 3.87–5.11)
RDW: 12.4 % (ref 11.5–15.5)
WBC: 8.4 10*3/uL (ref 4.0–10.5)
nRBC: 0 % (ref 0.0–0.2)

## 2020-02-13 LAB — MAGNESIUM: Magnesium: 2.4 mg/dL (ref 1.7–2.4)

## 2020-02-13 MED ORDER — FUROSEMIDE 10 MG/ML IJ SOLN
40.0000 mg | Freq: Once | INTRAMUSCULAR | Status: AC
  Administered 2020-02-13: 40 mg via INTRAVENOUS
  Filled 2020-02-13: qty 4

## 2020-02-13 MED ORDER — METOPROLOL TARTRATE 50 MG PO TABS
75.0000 mg | ORAL_TABLET | Freq: Two times a day (BID) | ORAL | Status: DC
  Administered 2020-02-13 – 2020-02-14 (×2): 75 mg via ORAL
  Filled 2020-02-13 (×2): qty 1

## 2020-02-13 MED ORDER — METOPROLOL TARTRATE 5 MG/5ML IV SOLN
5.0000 mg | Freq: Once | INTRAVENOUS | Status: AC
  Administered 2020-02-14: 5 mg via INTRAVENOUS
  Filled 2020-02-13: qty 5

## 2020-02-13 NOTE — TOC Initial Note (Signed)
Transition of Care [facility]) - Initial/Assessment Note    Patient Details  Name: [name] MRN: [id number] Date of Birth: 04-18-1920  [facility] CM/SW Contact:    Dessa Phi, RN Phone Number: 02/13/2020, 3:02 PM  Clinical Narrative: Spoke to patient's dtr in law [name] about d/c plans-they would prefer rehab but patient has expressed her wishes that she doesn't want to go to a nursing home. Encouraged dtr to asst with patient working with PT so we can establish the safest d/c plan-she voiced understanding. Explained that [place] can be provided, & we can submit the custodial level care(20 hrs-asst w/adl's) form to insurance for approval-dtr voiced understanding.[facility] will follow if HHC needed-HHPT/OT only.                  Expected Discharge Plan: [place] Barriers to Discharge: Continued Medical Work up   Patient Goals and CMS Choice Patient states their goals for this hospitalization and ongoing recovery are:: go home CMS Medicare.gov Compare Post Acute Care list provided to:: Patient Represenative (must comment) Choice offered to / list presented to : Adult Children  Expected Discharge Plan and Services Expected Discharge Plan: [place]   Discharge Planning Services: CM Consult Post Acute Care Choice: [place] arrangements for the past 2 months: Single Family Home                                      Prior Living Arrangements/Services Living arrangements for the past 2 months: Single Family Home Lives with:: Adult Children Patient language and need for interpreter reviewed:: Yes Do you feel safe going back to the place where you live?: Yes      Need for Family Participation in Patient Care: No (Comment) Care giver support system in place?: Yes (comment)   Criminal Activity/Legal Involvement Pertinent to Current Situation/Hospitalization: No - Comment as needed  Activities of Daily Living Home  Assistive Devices/Equipment: Walker (specify type) ADL Screening (condition at time of admission) Patient's cognitive ability adequate to safely complete daily activities?: Yes Is the patient deaf or have difficulty hearing?: No Does the patient have difficulty seeing, even when wearing glasses/contacts?: No Does the patient have difficulty concentrating, remembering, or making decisions?: Yes Patient able to express need for assistance with ADLs?: Yes Does the patient have difficulty dressing or bathing?: Yes Independently performs ADLs?: No Communication: Independent Dressing (OT): Needs assistance Is this a change from baseline?: Pre-admission baseline Grooming: Needs assistance Is this a change from baseline?: Pre-admission baseline Feeding: Needs assistance Is this a change from baseline?: Pre-admission baseline Bathing: Needs assistance Is this a change from baseline?: Pre-admission baseline Toileting: Needs assistance Is this a change from baseline?: Pre-admission baseline In/Out Bed: Needs assistance Is this a change from baseline?: Pre-admission baseline Walks in Home: Needs assistance Is this a change from baseline?: Pre-admission baseline Does the patient have difficulty walking or climbing stairs?: Yes Weakness of Legs: Both Weakness of Arms/Hands: None  Permission Sought/Granted Permission sought to share information with : Case Manager Permission granted to share information with : Yes, Verbal Permission Granted  Share Information with NAME: Case Manager     Permission granted to share info w Relationship: dtr n law [name] [phone]     Emotional Assessment Appearance:: Appears stated age Attitude/Demeanor/Rapport: Gracious Affect (typically observed): Accepting Orientation: : Oriented to Self, Oriented to  Place, Oriented to Situation Alcohol / Substance Use: Not Applicable Psych Involvement: No (comment)  Admission diagnosis:  Acute pulmonary embolism  (HCC) [I26.99] Multiple subsegmental pulmonary emboli without acute cor pulmonale (HCC) [I26.94] Patient Active Problem List   Diagnosis Date Noted  . Acute pulmonary embolism ([place]) 02/10/2020  . Chronic diastolic CHF (congestive heart failure) ([place]) 02/10/2020  . New onset atrial fibrillation ([place]) 02/10/2020  . Lung nodule 02/10/2020  . History of echocardiogram   . GERD (gastroesophageal reflux disease)   . Diverticulosis   . Carotid bruit   . IBS (irritable bowel syndrome)   . Hypothyroidism   . HTN (hypertension)   . HLD (hyperlipidemia)   . CAD (coronary artery disease)   . Atypical chest pain 06/14/2012  . Chest pain 02/12/2012  . Fatigue 04/22/2011  . CAROTID BRUIT 03/19/2009  . HYPOTHYROIDISM 08/29/2008  . HEMORRHOIDS 08/29/2008  . GERD 08/29/2008  . DIVERTICULOSIS, COLON 08/29/2008  . HYPERLIPIDEMIA 07/04/2008  . HYPERTENSION, BENIGN 07/04/2008  . CAD, NATIVE VESSEL 07/04/2008  . IRRITABLE BOWEL SYNDROME 07/04/2008   PCP:  Holland Commons, FNP Pharmacy:   [id number] [place], [address] [place] [address] Phone: [phone] Fax: [phone]     Social Determinants of Health (SDOH) Interventions    Readmission Risk Interventions No flowsheet data found.

## 2020-02-13 NOTE — NC FL2 (Signed)
[place] LEVEL OF CARE SCREENING TOOL     IDENTIFICATION  Patient Name: [name] Birthdate: 1921/02/22 Sex: female Admission Date (Current Location): 02/10/2020  [facility] and [place] Number:  Herbalist and Address:  [facility],  [place] [address], [place]      Provider Number: [id number]  Attending Physician Name and Address:  Aline August, MD  Relative Name and Phone Number:  Signe Colt (dtr in law) [phone]    Current Level of Care: Hospital Recommended Level of Care: [place] Prior Approval Number:    Date Approved/Denied:   PASRR Number:    Discharge Plan: SNF    Current Diagnoses: Patient Active Problem List   Diagnosis Date Noted  . Acute pulmonary embolism ([place]) 02/10/2020  . Chronic diastolic CHF (congestive heart failure) ([place]) 02/10/2020  . New onset atrial fibrillation ([place]) 02/10/2020  . Lung nodule 02/10/2020  . History of echocardiogram   . GERD (gastroesophageal reflux disease)   . Diverticulosis   . Carotid bruit   . IBS (irritable bowel syndrome)   . Hypothyroidism   . HTN (hypertension)   . HLD (hyperlipidemia)   . CAD (coronary artery disease)   . Atypical chest pain 06/14/2012  . Chest pain 02/12/2012  . Fatigue 04/22/2011  . CAROTID BRUIT 03/19/2009  . HYPOTHYROIDISM 08/29/2008  . HEMORRHOIDS 08/29/2008  . GERD 08/29/2008  . DIVERTICULOSIS, COLON 08/29/2008  . HYPERLIPIDEMIA 07/04/2008  . HYPERTENSION, BENIGN 07/04/2008  . CAD, NATIVE VESSEL 07/04/2008  . IRRITABLE BOWEL SYNDROME 07/04/2008    Orientation RESPIRATION BLADDER Height & Weight     Self, Time, Situation, Place  O2 Continent Weight: 58.1 kg Height:  5\' 5"  (165.1 cm)  BEHAVIORAL SYMPTOMS/MOOD NEUROLOGICAL BOWEL NUTRITION STATUS      Continent Diet (Heart Healthy)  AMBULATORY STATUS COMMUNICATION OF NEEDS Skin   Limited Assist Verbally Normal                       Personal Care  Assistance Level of Assistance  Bathing, Feeding, Dressing Bathing Assistance: Limited assistance Feeding assistance: Limited assistance Dressing Assistance: Limited assistance     Functional Limitations Info  Sight, Hearing, Speech Sight Info: Impaired (eyeglasses-not wearing them currently) Hearing Info: Adequate Speech Info: Impaired (Dentures-top/bottom)    SPECIAL CARE FACTORS FREQUENCY  PT (By licensed PT), OT (By licensed OT)     PT Frequency: 5x week OT Frequency: 5x week            Contractures Contractures Info: Not present    Additional Factors Info  Code Status, Allergies, Psychotropic Code Status Info:  (DNR) Allergies Info:  (NKA) Psychotropic Info:  Hollace Hayward MAR)         Current Medications (02/13/2020):  This is the current hospital active medication list Current Facility-Administered Medications  Medication Dose Route Frequency Provider Last Rate Last Admin  . 0.9 %  sodium chloride infusion  250 mL Intravenous PRN Opyd, Ilene Qua, MD      . acetaminophen (TYLENOL) tablet 650 mg  650 mg Oral Q6H PRN Opyd, Ilene Qua, MD       Or  . acetaminophen (TYLENOL) suppository 650 mg  650 mg Rectal Q6H PRN Opyd, Ilene Qua, MD      . apixaban (ELIQUIS) tablet 5 mg  5 mg Oral BID Aline August, MD   5 mg at 02/13/20 1037  . levothyroxine (SYNTHROID) tablet 100 mcg  100 mcg Oral Q0600  Vianne Bulls, MD   100 mcg at 02/13/20 0527  . metoprolol tartrate (LOPRESSOR) tablet 75 mg  75 mg Oral BID Aline August, MD   75 mg at 02/13/20 1023  . mirabegron ER (MYRBETRIQ) tablet 50 mg  50 mg Oral QPM Opyd, Ilene Qua, MD   50 mg at 02/13/20 1721  . ondansetron (ZOFRAN) tablet 4 mg  4 mg Oral Q6H PRN Opyd, Ilene Qua, MD       Or  . ondansetron (ZOFRAN) injection 4 mg  4 mg Intravenous Q6H PRN Opyd, Ilene Qua, MD   4 mg at 02/10/20 2335  . polyethylene glycol (MIRALAX / GLYCOLAX) packet 17 g  17 g Oral Daily PRN Opyd, Ilene Qua, MD      . simvastatin (ZOCOR) tablet 20  mg  20 mg Oral QHS Opyd, Ilene Qua, MD   20 mg at 02/12/20 2149  . sodium chloride flush (NS) 0.9 % injection 3 mL  3 mL Intravenous Q12H Opyd, Timothy S, MD      . sodium chloride flush (NS) 0.9 % injection 3 mL  3 mL Intravenous Q12H Opyd, Ilene Qua, MD   3 mL at 02/10/20 2319  . sodium chloride flush (NS) 0.9 % injection 3 mL  3 mL Intravenous PRN Opyd, Ilene Qua, MD      . traZODone (DESYREL) tablet 50 mg  50 mg Oral QHS Opyd, Ilene Qua, MD   50 mg at 02/12/20 2149  . trimethoprim (TRIMPEX) tablet 100 mg  100 mg Oral Daily Opyd, Ilene Qua, MD   100 mg at 02/13/20 1039     Discharge Medications: Please see discharge summary for a list of discharge medications.  Relevant Imaging Results:  Relevant Lab Results:   Additional Information SS#[phone]  Jos Cygan, Juliann Pulse, RN

## 2020-02-13 NOTE — Progress Notes (Addendum)
Patient ID: [name], female   DOB: 1921/02/14, 86 y.o.   MRN: [id number]  PROGRESS NOTE    [name]  IFO:[id number] DOB: 03/25/1921 DOA: 02/10/2020 PCP: Holland Commons, FNP   Brief Narrative:  86 year old female with history of hypertension, CAD, chronic diastolic CHF, hypothyroidism and recurrent UTI presented with shortness of breath, aches and malaise.  On presentation, she was saturating in the mid to upper 80s on room air, tachycardic in the 110s, blood pressure was stable.  EKG showed atrial fibrillation.  CT of the head was negative for any acute intracranial abnormality.  CT of the abdomen and pelvis showed no acute findings.  CTA chest showed acute pulmonary embolism and right upper lobe nodule.  COVID-19 test was negative.  She was started on IV heparin.  She was subsequently switched to oral Eliquis on 02/12/2020.  Assessment & Plan:   Acute PE Acute hypoxic respiratory failure -CTA of the chest showed acute pulmonary embolism.  Initially started on IV heparin but was switched to oral Eliquis on 02/12/2020. -Lower extremity duplex did not show any evidence of lower extremity DVT.  Echo showed EF of 60 to 65% with moderate MR -Still on 3 L oxygen via nasal cannula.  Wean off as able.  Might need supplemental oxygen on discharge.  Chronic diastolic CHF -Currently compensated.  Continue metoprolol.  Strict input and output.  Daily weights. -Outpatient follow-up with cardiology  Paroxysmal A. fib with RVR -Likely precipitated by PE.  Still tachycardic.  Increase metoprolol to 75 mg twice a day.  Outpatient follow-up with cardiology.  Anticoagulant plan as above.  Essential hypertension -Blood pressure improving.  Resume metoprolol as above  Hypothyroidism -Continue Synthroid  CKD IIIa -creatinine stable  Lung nodule -Incidentally noted on CT on presentation.  Outpatient follow-up  Generalized deconditioning -PT eval.  Palliative care consultation for goals of  care discussion   DVT prophylaxis:  Eliquis Code Status: DNR Family Communication: None at bedside Disposition Plan: Status is: Inpatient  Remains inpatient appropriate because:Inpatient level of care appropriate due to severity of illness   Dispo: The patient is from: Home              Anticipated d/c is to: Home              Anticipated d/c date is: 1 day              Patient currently is not medically stable to d/c.  Consultants: None  Procedures: Echo/lower extremity duplex  Antimicrobials: None   Subjective: Patient seen and examined at bedside.  No overnight fever, vomiting reported.  Poor historian.  Sleepy, wakes up slightly, hardly answers any questions.  Objective: Vitals:   02/12/20 1825 02/12/20 2135 02/13/20 0613 02/13/20 0632  BP: 112/73 133/79 134/88   Pulse: (!) 123 (!) 126 89   Resp: (!) 21 (!) 21 20   Temp: 98.7 F (37.1 C) 98.5 F (36.9 C) 97.8 F (36.6 C)   TempSrc: Oral Oral Oral   SpO2: 91% 93% 92%   Weight:    58.1 kg  Height:        Intake/Output Summary (Last 24 hours) at 02/13/2020 0736 Last data filed at 02/13/2020 6767 Gross per 24 hour  Intake 480 ml  Output 600 ml  Net -120 ml   Filed Weights   02/10/20 2335 02/12/20 0409 02/13/20 0632  Weight: 58.1 kg 57.6 kg 58.1 kg    Examination:  General exam: No distress.  Elderly female lying in bed. Respiratory system: Bilateral decreased breath sounds at bases with some crackles, no wheezing.  Intermittently tachypneic  cardiovascular system: Intermittently tachycardic, S1-S2 heard Gastrointestinal system: Abdomen is nondistended, soft and nontender.  Bowel sounds heard  extremities: No edema or clubbing. Central nervous system: Poor historian.  Sleepy, wakes up slightly, hardly answers any questions.  No focal neurological deficits.  Moves extremities Skin: No obvious ecchymosis/lesions Psychiatry: Flat affect   Data Reviewed: I have personally reviewed following labs and imaging  studies  CBC: Recent Labs  Lab 02/10/20 1836 02/11/20 0723 02/12/20 0519 02/13/20 0517  WBC 10.5 8.5 8.2 8.4  NEUTROABS 6.9  --   --  5.9  HGB 11.7* 11.3* 10.3* 11.0*  HCT 36.9 35.8* 33.5* 35.8*  MCV 103.4* 103.8* 105.7* 105.9*  PLT 182 165 153 595   Basic Metabolic Panel: Recent Labs  Lab 02/10/20 1836 02/11/20 0723 02/12/20 0519 02/13/20 0517  NA 141 138 139 138  K 4.2 4.4 4.5 5.0  CL 103 105 107 102  CO2 26 25 25 27   GLUCOSE 120* 100* 119* 123*  BUN 18 16 14 17   CREATININE 0.97 0.90 0.91 0.94  CALCIUM 9.3 8.6* 8.4* 8.8*  MG  --  2.4 2.2 2.4  PHOS  --   --  3.3  --    GFR: Estimated Creatinine Clearance: 30.8 mL/min (by C-G formula based on SCr of 0.94 mg/dL). Liver Function Tests: Recent Labs  Lab 02/10/20 1836  AST 19  ALT 13  ALKPHOS 64  BILITOT 1.0  PROT 7.7  ALBUMIN 3.9   No results for input(s): LIPASE, AMYLASE in the last 168 hours. No results for input(s): AMMONIA in the last 168 hours. Coagulation Profile: Recent Labs  Lab 02/10/20 2318  INR 1.1   Cardiac Enzymes: No results for input(s): CKTOTAL, CKMB, CKMBINDEX, TROPONINI in the last 168 hours. BNP (last 3 results) No results for input(s): PROBNP in the last 8760 hours. HbA1C: No results for input(s): HGBA1C in the last 72 hours. CBG: No results for input(s): GLUCAP in the last 168 hours. Lipid Profile: No results for input(s): CHOL, HDL, LDLCALC, TRIG, CHOLHDL, LDLDIRECT in the last 72 hours. Thyroid Function Tests: Recent Labs    02/11/20 0723 02/11/20 1411  TSH 0.098*  --   FREET4  --  1.51*  T3FREE  --  2.2   Anemia Panel: No results for input(s): VITAMINB12, FOLATE, FERRITIN, TIBC, IRON, RETICCTPCT in the last 72 hours. Sepsis Labs: No results for input(s): PROCALCITON, LATICACIDVEN in the last 168 hours.  Recent Results (from the past 240 hour(s))  Urine Culture     Status: None   Collection Time: 02/10/20  6:00 PM   Specimen: Urine, Random  Result Value Ref Range  Status   Specimen Description   Final    URINE, RANDOM Performed at [place] [address], [place], [place] [address]    Special Requests   Final    NONE Performed at [facility], [place] [address], [place], [place] [address]    Culture   Final    NO GROWTH Performed at [facility], [place] [address] [place], [place] [address]    Report Status 02/11/2020 FINAL  Final  Respiratory Panel by RT PCR (Flu A&B, Covid) - Nasopharyngeal Swab     Status: None   Collection Time: 02/10/20  6:00 PM   Specimen: Nasopharyngeal Swab  Result Value Ref Range Status   SARS Coronavirus  2 by RT PCR NEGATIVE NEGATIVE Final    Comment: (NOTE) SARS-CoV-2 target nucleic acids are NOT DETECTED.  The SARS-CoV-2 RNA is generally detectable in upper respiratoy specimens during the acute phase of infection. The lowest concentration of SARS-CoV-2 viral copies this assay can detect is 131 copies/mL. A negative result does not preclude SARS-Cov-2 infection and should not be used as the sole basis for treatment or other patient management decisions. A negative result may occur with  improper specimen collection/handling, submission of specimen other than nasopharyngeal swab, presence of viral mutation(s) within the areas targeted by this assay, and inadequate number of viral copies (<131 copies/mL). A negative result must be combined with clinical observations, patient history, and epidemiological information. The expected result is Negative.  Fact Sheet for Patients:  PinkCheek.be  Fact Sheet for Healthcare Providers:  GravelBags.it  This test is no t yet approved or cleared by the [place] FDA and  has been authorized for detection and/or diagnosis of SARS-CoV-2 by FDA under an Emergency Use Authorization (EUA). This EUA will remain  in effect (meaning this test can be used) for the duration of  the COVID-19 declaration under Section 564(b)(1) of the Act, 21 U.S.C. section 360bbb-3(b)(1), unless the authorization is terminated or revoked sooner.     Influenza A by PCR NEGATIVE NEGATIVE Final   Influenza B by PCR NEGATIVE NEGATIVE Final    Comment: (NOTE) The Xpert Xpress SARS-CoV-2/FLU/RSV assay is intended as an aid in  the diagnosis of influenza from Nasopharyngeal swab specimens and  should not be used as a sole basis for treatment. Nasal washings and  aspirates are unacceptable for Xpert Xpress SARS-CoV-2/FLU/RSV  testing.  Fact Sheet for Patients: PinkCheek.be  Fact Sheet for Healthcare Providers: GravelBags.it  This test is not yet approved or cleared by the [place] FDA and  has been authorized for detection and/or diagnosis of SARS-CoV-2 by  FDA under an Emergency Use Authorization (EUA). This EUA will remain  in effect (meaning this test can be used) for the duration of the  Covid-19 declaration under Section 564(b)(1) of the Act, 21  U.S.C. section 360bbb-3(b)(1), unless the authorization is  terminated or revoked. Performed at [facility], [place] [address] [place], [place] [address]          Radiology Studies: ECHOCARDIOGRAM COMPLETE  Result Date: 02/11/2020    ECHOCARDIOGRAM REPORT   Patient Name:   [name] [facility] Date of Exam: 02/11/2020 Medical Rec #:  [id number]      Height:       65.0 in Accession #:    [id number]     Weight:       128.1 lb Date of Birth:  [birth year]      BSA:          1.637 m Patient Age:    89 years       BP:           105/77 mmHg Patient Gender: F              HR:           120 bpm. Exam Location:  Inpatient Procedure: 2D Echo, Cardiac Doppler and Color Doppler Indications:    Pulmonary Embolus, Atrial fibrillation  History:        Patient has prior history of Echocardiogram examinations, most                 recent 03/30/2016. CHF, CAD, Arrythmias:Atrial  Fibrillation,  Signs/Symptoms:Shortness of Breath; Risk Factors:Hypertension.  Sonographer:    Dustin Flock Referring Phys: [phone] [place]  1. Left ventricular ejection fraction, by estimation, is 60 to 65%. The left ventricle has normal function. The left ventricle has no regional wall motion abnormalities. There is mild left ventricular hypertrophy. Left ventricular diastolic function could not be evaluated.  2. Right ventricular systolic function is normal. The right ventricular size is normal. There is moderately elevated pulmonary artery systolic pressure. The estimated right ventricular systolic pressure is 10.9 mmHg.  3. The mitral valve is abnormal. Moderate mitral valve regurgitation.  4. The aortic valve is tricuspid. Aortic valve regurgitation is not visualized. Mild aortic valve sclerosis is present, with no evidence of aortic valve stenosis.  5. The inferior vena cava is normal in size with <50% respiratory variability, suggesting right atrial pressure of 8 mmHg. Comparison(s): Prior images unable to be directly viewed, comparison made by report only. 03/30/2016: LVEF 65-70%, grade 1 DD with high LV filling pressure, trivial MR, RVSP 28 mmHg. FINDINGS  Left Ventricle: Left ventricular ejection fraction, by estimation, is 60 to 65%. The left ventricle has normal function. The left ventricle has no regional wall motion abnormalities. The left ventricular internal cavity size was normal in size. There is  mild left ventricular hypertrophy. Left ventricular diastolic function could not be evaluated due to atrial fibrillation. Left ventricular diastolic function could not be evaluated. Right Ventricle: The right ventricular size is normal. No increase in right ventricular wall thickness. Right ventricular systolic function is normal. There is moderately elevated pulmonary artery systolic pressure. The tricuspid regurgitant velocity is 3.41 m/s, and with an assumed  right atrial pressure of 8 mmHg, the estimated right ventricular systolic pressure is 32.3 mmHg. Left Atrium: Left atrial size was normal in size. Right Atrium: Right atrial size was normal in size. Pericardium: There is no evidence of pericardial effusion. Mitral Valve: The mitral valve is abnormal. There is mild thickening of the mitral valve leaflet(s). Mild to moderate mitral annular calcification. Moderate mitral valve regurgitation, with posteriorly-directed jet. Tricuspid Valve: The tricuspid valve is grossly normal. Tricuspid valve regurgitation is mild. Aortic Valve: The aortic valve is tricuspid. Aortic valve regurgitation is not visualized. Mild aortic valve sclerosis is present, with no evidence of aortic valve stenosis. Pulmonic Valve: The pulmonic valve was grossly normal. Pulmonic valve regurgitation is trivial. Aorta: The aortic root and ascending aorta are structurally normal, with no evidence of dilitation. Venous: The inferior vena cava is normal in size with less than 50% respiratory variability, suggesting right atrial pressure of 8 mmHg. IAS/Shunts: No atrial level shunt detected by color flow Doppler.  LEFT VENTRICLE PLAX 2D LVIDd:         3.30 cm  Diastology LVIDs:         2.20 cm  LV e' medial:    6.20 cm/s LV PW:         1.10 cm  LV E/e' medial:  20.2 LV IVS:        1.10 cm  LV e' lateral:   9.90 cm/s LVOT diam:     1.90 cm  LV E/e' lateral: 12.6 LV SV:         46 LV SV Index:   28 LVOT Area:     2.84 cm  RIGHT VENTRICLE RV Basal diam:  2.90 cm RV S prime:     10.60 cm/s TAPSE (M-mode): 3.0 cm LEFT ATRIUM  Index       RIGHT ATRIUM           Index LA diam:        4.10 cm 2.50 cm/m  RA Area:     14.90 cm LA Vol (A2C):   42.6 ml 26.02 ml/m RA Volume:   32.70 ml  19.98 ml/m LA Vol (A4C):   52.9 ml 32.32 ml/m LA Biplane Vol: 47.8 ml 29.20 ml/m  AORTIC VALVE LVOT Vmax:   80.20 cm/s LVOT Vmean:  56.700 cm/s LVOT VTI:    0.163 m  AORTA Ao Root diam: 2.70 cm MITRAL VALVE                 TRICUSPID VALVE MV Area (PHT): 3.08 cm     TR Peak grad:   46.5 mmHg MV Decel Time: 246 msec     TR Vmax:        341.00 cm/s MV E velocity: 125.00 cm/s                             SHUNTS                             Systemic VTI:  0.16 m                             Systemic Diam: 1.90 cm Lyman Bishop MD Electronically signed by Lyman Bishop MD Signature Date/Time: 02/11/2020/10:36:48 AM    Final    VAS [place] LOWER EXTREMITY VENOUS (DVT)  Result Date: 02/11/2020  Lower Venous DVT Study Indications: Pulmonary embolism.  Risk Factors: None identified. Limitations: Poor ultrasound/tissue interface. Comparison Study: No prior studies. Performing Technologist: Oliver Hum RVT  Examination Guidelines: A complete evaluation includes B-mode imaging, spectral Doppler, color Doppler, and power Doppler as needed of all accessible portions of each vessel. Bilateral testing is considered an integral part of a complete examination. Limited examinations for reoccurring indications may be performed as noted. The reflux portion of the exam is performed with the patient in reverse Trendelenburg.  +---------+---------------+---------+-----------+----------+--------------+ RIGHT    CompressibilityPhasicitySpontaneityPropertiesThrombus Aging +---------+---------------+---------+-----------+----------+--------------+ CFV      Full           Yes      Yes                                 +---------+---------------+---------+-----------+----------+--------------+ SFJ      Full                                                        +---------+---------------+---------+-----------+----------+--------------+ FV Prox  Full                                                        +---------+---------------+---------+-----------+----------+--------------+ FV Mid   Full                                                        +---------+---------------+---------+-----------+----------+--------------+  FV  DistalFull                                                        +---------+---------------+---------+-----------+----------+--------------+ PFV      Full                                                        +---------+---------------+---------+-----------+----------+--------------+ POP      Full           Yes      Yes                                 +---------+---------------+---------+-----------+----------+--------------+ PTV      Full                                                        +---------+---------------+---------+-----------+----------+--------------+ PERO     Full                                                        +---------+---------------+---------+-----------+----------+--------------+   +---------+---------------+---------+-----------+----------+--------------+ LEFT     CompressibilityPhasicitySpontaneityPropertiesThrombus Aging +---------+---------------+---------+-----------+----------+--------------+ CFV      Full           Yes      Yes                                 +---------+---------------+---------+-----------+----------+--------------+ SFJ      Full                                                        +---------+---------------+---------+-----------+----------+--------------+ FV Prox  Full                                                        +---------+---------------+---------+-----------+----------+--------------+ FV Mid   Full                                                        +---------+---------------+---------+-----------+----------+--------------+ FV DistalFull                                                        +---------+---------------+---------+-----------+----------+--------------+  PFV      Full                                                        +---------+---------------+---------+-----------+----------+--------------+ POP      Full           Yes      Yes                                  +---------+---------------+---------+-----------+----------+--------------+ PTV      Full                                                        +---------+---------------+---------+-----------+----------+--------------+ PERO     Full                                                        +---------+---------------+---------+-----------+----------+--------------+     Summary: RIGHT: - There is no evidence of deep vein thrombosis in the lower extremity.  - No cystic structure found in the popliteal fossa.  LEFT: - There is no evidence of deep vein thrombosis in the lower extremity.  - No cystic structure found in the popliteal fossa.  *See table(s) above for measurements and observations. Electronically signed by Deitra Mayo MD on 02/11/2020 at 5:52:53 PM.    Final         Scheduled Meds:  apixaban  5 mg Oral BID   levothyroxine  100 mcg Oral Q0600   metoprolol tartrate  50 mg Oral BID   mirabegron ER  50 mg Oral QPM   simvastatin  20 mg Oral QHS   sodium chloride flush  3 mL Intravenous Q12H   sodium chloride flush  3 mL Intravenous Q12H   traZODone  50 mg Oral QHS   trimethoprim  100 mg Oral Daily   Continuous Infusions:  sodium chloride            Aline August, MD [facility] 02/13/2020, 7:36 AM

## 2020-02-13 NOTE — Progress Notes (Signed)
Pt's O2 sat dropped into the 50's while eating. Oxygen was off. Put oxygen back on pt and increased to 3 LPM, saturation returned to 95% within 5 minutes.

## 2020-02-13 NOTE — Evaluation (Signed)
Physical Therapy Evaluation Patient Details Name: [name] MRN: [id number] DOB: 1921/01/13 Today's Date: 02/13/2020   History of Present Illness  86 year old female with history of hypertension, CAD, chronic diastolic CHF, hypothyroidism and recurrent UTI presented with shortness of breath, aches and malaise.  On presentation, she was saturating in the mid to upper 80s on room air, tachycardic in the 110s, blood pressure was stable.  EKG showed atrial fibrillation.  CT of the head was negative for any acute intracranial abnormality.  CT of the abdomen and pelvis showed no acute findings.  CTA chest showed acute pulmonary embolism and right upper lobe nodule  Clinical Impression  Pt admitted with above diagnosis.  Pt currently with functional limitations due to the deficits listed below (see PT Problem List). Pt will benefit from skilled PT to increase their independence and safety with mobility to allow discharge to the venue listed below.  Pt assisted to [facility] and then performed a couple sit to stands and marching for strengthening.  Pt reports fatigue and generalized weakness throughout session.  Pt's SPO2 dropped to 80% on 3L O2 with transfer to [facility] so increased to 4L and SPO2 improved to mid 90s (95% upon leaving room).  HR up to 127 bpm.  Son present and reports pt is typically very independent, and he cannot assist pt at home in current condition.  Recommend SNF upon d/c.     Follow Up Recommendations SNF;Supervision/Assistance - 24 hour    Equipment Recommendations  None recommended by PT    Recommendations for Other Services       Precautions / Restrictions Precautions Precautions: Fall Precaution Comments: monitor sats and HR      Mobility  Bed Mobility Overal bed mobility: Needs Assistance Bed Mobility: Supine to Sit;Sit to Supine     Supine to sit: Max assist;+2 for physical assistance Sit to supine: Mod assist;+2 for physical assistance   General bed mobility  comments: assist for upper and lower body, assist for lower body to return to bed; pt appears very fatigued (had been up to [facility] twice with nursing today)    Transfers Overall transfer level: Needs assistance Equipment used: 2 person hand held assist Transfers: Sit to/from [facility] Sit to Stand: Mod assist;+2 physical assistance Stand pivot transfers: Mod assist;+2 physical assistance       General transfer comment: verbal cues for technique; pt assisted to/from [facility], pt then performed sit to stand twice from bed and marched in place for 10 seconds; occasional LEs buckling so did not ambulate yet for safety  Ambulation/Gait                Stairs            Wheelchair Mobility    Modified Rankin (Stroke Patients Only)       Balance Overall balance assessment: Needs assistance Sitting-balance support: Bilateral upper extremity supported;Feet supported Sitting balance-Leahy Scale: Poor     Standing balance support: Bilateral upper extremity supported Standing balance-Leahy Scale: Poor Standing balance comment: reliant on UEs for support                             Pertinent Vitals/Pain Pain Assessment: Faces Faces Pain Scale: Hurts little more Pain Location: generalized Pain Descriptors / Indicators: Moaning Pain Intervention(s): Repositioned;Monitored during session    Home Living Family/patient expects to be discharged to:: Private residence Living Arrangements: Children   Type of Home: House  Home Layout: One level Home Equipment: Walker - 2 wheels      Prior Function Level of Independence: Independent   Gait / Transfers Assistance Needed: per son, pt was just at a festival last week, typically very independent           Hand Dominance        Extremity/Trunk Assessment   Upper Extremity Assessment Upper Extremity Assessment: Generalized weakness    Lower Extremity Assessment Lower Extremity  Assessment: Generalized weakness       Communication   Communication: HOH  Cognition Arousal/Alertness: Awake/alert Behavior During Therapy: Flat affect Overall Cognitive Status: Within Functional Limits for tasks assessed                                 General Comments: son reports pt more lethargic      General Comments      Exercises     Assessment/Plan    PT Assessment Patient needs continued PT services  PT Problem List Decreased strength;Decreased mobility;Decreased activity tolerance;Decreased balance;Decreased knowledge of use of DME;Cardiopulmonary status limiting activity       PT Treatment Interventions Therapeutic exercise;Gait training;DME instruction;Functional mobility training;Therapeutic activities;Patient/family education;Balance training    PT Goals (Current goals can be found in the Care Plan section)  Acute Rehab PT Goals PT Goal Formulation: With patient/family Time For Goal Achievement: 02/27/20 Potential to Achieve Goals: Good    Frequency Min 2X/week   Barriers to discharge        Co-evaluation               AM-PAC PT "6 Clicks" Mobility  Outcome Measure Help needed turning from your back to your side while in a flat bed without using bedrails?: A Lot Help needed moving from lying on your back to sitting on the side of a flat bed without using bedrails?: A Lot Help needed moving to and from a bed to a chair (including a wheelchair)?: A Lot Help needed standing up from a chair using your arms (e.g., wheelchair or bedside chair)?: A Lot Help needed to walk in hospital room?: Total Help needed climbing 3-5 steps with a railing? : Total 6 Click Score: 10    End of Session Equipment Utilized During Treatment: Gait belt;Oxygen Activity Tolerance: Patient tolerated treatment well Patient left: with call bell/phone within reach;in bed;with bed alarm set;with family/visitor present Nurse Communication: Mobility status PT  Visit Diagnosis: Other abnormalities of gait and mobility (R26.89);Muscle weakness (generalized) (M62.81)    Time: 6629-4765 PT Time Calculation (min) (ACUTE ONLY): 24 min   Charges:   PT Evaluation $PT Eval Low Complexity: 1 Low PT Treatments $Therapeutic Activity: 8-22 mins   Jannette Spanner PT, DPT Acute Rehabilitation Services Pager: [phone] Office: [phone]  York Ram E 02/13/2020, 4:44 PM

## 2020-02-14 ENCOUNTER — Inpatient Hospital Stay (HOSPITAL_COMMUNITY): Payer: PPO

## 2020-02-14 DIAGNOSIS — I2699 Other pulmonary embolism without acute cor pulmonale: Principal | ICD-10-CM

## 2020-02-14 DIAGNOSIS — I4891 Unspecified atrial fibrillation: Secondary | ICD-10-CM

## 2020-02-14 DIAGNOSIS — R0600 Dyspnea, unspecified: Secondary | ICD-10-CM

## 2020-02-14 DIAGNOSIS — Z7189 Other specified counseling: Secondary | ICD-10-CM

## 2020-02-14 DIAGNOSIS — I2510 Atherosclerotic heart disease of native coronary artery without angina pectoris: Secondary | ICD-10-CM

## 2020-02-14 DIAGNOSIS — I5032 Chronic diastolic (congestive) heart failure: Secondary | ICD-10-CM

## 2020-02-14 DIAGNOSIS — Z515 Encounter for palliative care: Secondary | ICD-10-CM

## 2020-02-14 LAB — MAGNESIUM: Magnesium: 2 mg/dL (ref 1.7–2.4)

## 2020-02-14 MED ORDER — METOPROLOL TARTRATE 50 MG PO TABS
100.0000 mg | ORAL_TABLET | Freq: Two times a day (BID) | ORAL | Status: DC
  Administered 2020-02-14 – 2020-02-18 (×9): 100 mg via ORAL
  Filled 2020-02-14 (×9): qty 2

## 2020-02-14 MED ORDER — FUROSEMIDE 10 MG/ML IJ SOLN
20.0000 mg | Freq: Once | INTRAMUSCULAR | Status: AC
  Administered 2020-02-14: 20 mg via INTRAVENOUS
  Filled 2020-02-14: qty 2

## 2020-02-14 NOTE — Consult Note (Addendum)
Cardiology Consultation:   Patient ID: MACKINSEY PELLAND MRN: [id number]; DOB: 08-14-1920  Admit date: 02/10/2020 Date of Consult: 02/14/2020  Primary Care Provider: Holland Commons, FNP [facility] Cardiologist: Kirk Ruths, MD  [facility] Electrophysiologist:  None    Patient Profile:   RANDALYN AHMED is a 86 y.o. female with a PMH of CAD s/p PCI to LAD in 2009 and PCI/DES to RCA in 5732, chronic diastolic CHF, HTN, HLD, hypothyroidism, recurrent UTI, who is being seen today for the evaluation of atrial fibrillation at the request of Dr. Shanda Howells.  History of Present Illness:   Ms. Foxworth was admitted to the hospital 02/10/20 after presenting with SOB, aches, and malaise. On arrival to the ED she was hypoxic with O2 sats in the mid-upper 80s and tachycardic. EKG revealed atrial fibrillation. She underwent a CTA Chest which showed acute PE in the RUL. She was started on a heparin gtt. Echocardiogram showed EF 60-65%, mild LVH, indeterminate LV diastolic function, no RWMA, normal RV size/function, moderately elevated PA pressures, and moderate MR. Home BBlocker was initially held, then restarted 02/12/20 to help with rate control of her atrial fibrillation with RVR, subsequently uptitrated to 100mg  BID. Cardiology asked to evaluate due to difficulty with rate control.  She currently denies chest pain but reports feeling short of breath.  She was last evaluated by cardiology at an outpatient visit with Dr. Stanford Breed 8/20201, at which time she was noted to have ongoing DOE but no other anginal complaints. Prior to this admission her last echocardiogram in 2017 showed EF 65-70%, G1DD, elevated LVEDP, and mild TR. Her last ischemic evaluation was a NST in 2017 which was without ischemia.    Past Medical History:  Diagnosis Date  . CAD (coronary artery disease)    a. 07/2007 PCI/Promus DES to LAD;  b. 02/2010 PCI/Promus DES to dRCA.;  c. 12/2010 Lexi MV: EF 88%, no isch/infarct.;  d. 02/2012  Cath: LM nl, LAD 20p, patent mid stent, 60d, D1 sm 70ost, D2 sm, diff, LCX sm , OM1 diff 69m (no change), RCA 40ost, patent stent, PD/PL nl, EF 75% w/o rwma's.  . Carotid bruit   . Diverticulosis   . GERD (gastroesophageal reflux disease)   . Hemorrhoids   . History of echocardiogram    a. 06/2012 Echo: EF nl, mild LVH.  Marland Kitchen HLD (hyperlipidemia)   . HTN (hypertension)   . Hypothyroidism   . IBS (irritable bowel syndrome)     Past Surgical History:  Procedure Laterality Date  . BACK SURGERY    . CORONARY ANGIOPLASTY WITH STENT PLACEMENT    . hysterectomy - unknown type    . LEFT HEART CATHETERIZATION WITH CORONARY ANGIOGRAM N/A 02/13/2012   Procedure: LEFT HEART CATHETERIZATION WITH CORONARY ANGIOGRAM;  Surgeon: Josue Hector, MD;  Location: [facility] CATH LAB;  Service: Cardiovascular;  Laterality: N/A;  . RECTOCELE REPAIR       Home Medications:  Prior to Admission medications   Medication Sig Start Date End Date Taking? Authorizing Provider  acetaminophen (TYLENOL) 500 MG tablet Take 500 mg by mouth every 6 (six) hours as needed for moderate pain.   Yes [provider]  amLODipine (NORVASC) 10 MG tablet Take 10 mg by mouth daily.  08/19/13  Yes [provider]  aspirin 81 MG tablet Take 1 tablet (81 mg total) by mouth daily. Patient taking differently: Take 81 mg by mouth every evening.  12/03/10  Yes Bensimhon, Shaune Pascal, MD  hydrOXYzine (ATARAX/VISTARIL) 25  MG tablet Take 25 mg by mouth at bedtime as needed for anxiety.  02/03/20  Yes [provider]  isosorbide mononitrate (IMDUR) 30 MG 24 hr tablet Take 0.5 tablets (15 mg total) by mouth daily. 02/26/19 02/10/20 Yes Lelon Perla, MD  levothyroxine (SYNTHROID, LEVOTHROID) 100 MCG tablet Take 1 tablet by mouth daily. 12/13/15  Yes [provider]  metoprolol tartrate (LOPRESSOR) 50 MG tablet TAKE 1 TABLET BY MOUTH 2 TIMES DAILY. 04/22/19  Yes Lelon Perla, MD  Multiple Vitamin (MULTIVITAMIN) tablet  Take 1 tablet by mouth every evening.    Yes [provider]  MYRBETRIQ 50 MG TB24 tablet Take 50 mg by mouth every evening.  10/31/19  Yes [provider]  nitroGLYCERIN (NITROSTAT) 0.4 MG SL tablet Place 0.4 mg under the tongue every 5 (five) minutes as needed. For chest pain   Yes [provider]  simvastatin (ZOCOR) 20 MG tablet Take 20 mg by mouth at bedtime.   Yes [provider]  traZODone (DESYREL) 50 MG tablet Take 1 tablet by mouth at bedtime.  12/15/15  Yes [provider]  trimethoprim (TRIMPEX) 100 MG tablet Take 100 mg by mouth daily. 01/21/20  Yes [provider]  vitamin B-12 (CYANOCOBALAMIN) 500 MCG tablet Take 500 mcg by mouth daily.   Yes [provider]  cephALEXin (KEFLEX) 500 MG capsule Take 1 capsule (500 mg total) by mouth 2 (two) times daily for 7 days. 02/10/20 02/17/20  Noe Gens, PA-C  enalapril (VASOTEC) 10 MG tablet Take 1 tablet (10 mg total) by mouth daily. Patient not taking: Reported on 02/10/2020 07/17/13   Leonard Schwartz, MD    Inpatient Medications: Scheduled Meds: . apixaban  5 mg Oral BID  . levothyroxine  100 mcg Oral Q0600  . metoprolol tartrate  100 mg Oral BID  . mirabegron ER  50 mg Oral QPM  . simvastatin  20 mg Oral QHS  . sodium chloride flush  3 mL Intravenous Q12H  . sodium chloride flush  3 mL Intravenous Q12H  . traZODone  50 mg Oral QHS  . trimethoprim  100 mg Oral Daily   Continuous Infusions: . sodium chloride     PRN Meds: sodium chloride, acetaminophen **OR** acetaminophen, ondansetron **OR** ondansetron (ZOFRAN) IV, polyethylene glycol, sodium chloride flush  Allergies:   No Known Allergies  Social History:   Social History   Socioeconomic History  . Marital status: Widowed    Spouse name: Not on file  . Number of children: 4  . Years of education: Not on file  . Highest education level: Not on file  Occupational History  . Occupation: retired  Tobacco Use    . Smoking status: Never Smoker  . Smokeless tobacco: Never Used  Vaping Use  . Vaping Use: Never used  Substance and Sexual Activity  . Alcohol use: No  . Drug use: No  . Sexual activity: Not on file  Other Topics Concern  . Not on file  Social History Narrative   Lives in [place].   Social Determinants of Health   Financial Resource Strain:   . Difficulty of Paying Living Expenses: Not on file  Food Insecurity:   . Worried About Charity fundraiser in the Last Year: Not on file  . Ran Out of Food in the Last Year: Not on file  Transportation Needs:   . Lack of Transportation (Medical): Not on file  . Lack of Transportation (Non-Medical): Not on file  Physical Activity:   . Days of Exercise per Week: Not on file  . Minutes of Exercise per Session: Not on file  Stress:   . Feeling of Stress : Not on file  Social Connections:   . Frequency of Communication with Friends and Family: Not on file  . Frequency of Social Gatherings with Friends and Family: Not on file  . Attends Religious Services: Not on file  . Active Member of Clubs or Organizations: Not on file  . Attends Archivist Meetings: Not on file  . Marital Status: Not on file  Intimate Partner Violence:   . Fear of Current or Ex-Partner: Not on file  . Emotionally Abused: Not on file  . Physically Abused: Not on file  . Sexually Abused: Not on file    Family History:    Family History  Problem Relation Age of Onset  . Myasthenia gravis Mother   . Hip fracture Father   . Heart disease Other        several uncles with CAD     ROS:  Please see the history of present illness.   All other ROS reviewed and negative.     Physical Exam/Data:   Vitals:   02/14/20 0351 02/14/20 0553 02/14/20 0800 02/14/20 1111  BP:  105/73  113/84  Pulse:  88  88  Resp:  20 20 20   Temp:  (!) 97.4 F (36.3 C)  97.7 F (36.5 C)  TempSrc:  Oral  Oral  SpO2:  98%  94%  Weight: 57.4 kg     Height:         Intake/Output Summary (Last 24 hours) at 02/14/2020 1830 Last data filed at 02/14/2020 1612 Gross per 24 hour  Intake 340 ml  Output 1115 ml  Net -775 ml   Last 3 Weights 02/14/2020 02/13/2020 02/12/2020  Weight (lbs) 126 lb 8.7 oz 128 lb 1.4 oz 126 lb 15.8 oz  Weight (kg) 57.4 kg 58.1 kg 57.6 kg     Body mass index is 21.06 kg/m.  General:  in no acute distress, appears chronically ill HEENT: normal Neck: no JVD Cardiac:  normal S1, S2; irregular, tachycardic, no murmurs Lungs:  clear to auscultation bilaterally Abd: soft, nontender Ext: no edema Musculoskeletal:  No deformities, BUE and BLE strength normal and equal Skin: warm and dry  Neuro:  no focal abnormalities noted, oriented to person only Psych:  Normal affect   EKG:  The EKG was personally reviewed and demonstrates:  Atrial fibrillation with RVR, Rate 132 bpm, non-specific T wave abnormalities, no STE/D Telemetry:  Telemetry was personally reviewed and demonstrates:  F with rates 100-120s  Relevant CV Studies: Echocardiogram 02/11/20: 1. Left ventricular ejection fraction, by estimation, is 60 to 65%. The  left ventricle has normal function. The left ventricle has no regional  wall motion abnormalities. There is mild left ventricular hypertrophy.  Left ventricular diastolic function  could not be evaluated.  2. Right ventricular systolic function is normal. The right ventricular  size is normal. There is moderately elevated pulmonary artery systolic  pressure. The estimated right ventricular systolic pressure is 86.7 mmHg.  3. The mitral valve is abnormal. Moderate mitral valve regurgitation.  4. The aortic valve is tricuspid. Aortic valve regurgitation is not  visualized. Mild aortic valve sclerosis is present, with no evidence of  aortic valve stenosis.  5. The inferior vena cava is normal in size with <50% respiratory  variability, suggesting right atrial pressure of 8 mmHg.  Comparison(s): Prior images  unable to be directly viewed, comparison made  by report only. 03/30/2016: LVEF 65-70%, grade 1 DD with high LV filling  pressure, trivial MR, RVSP 28 mmHg.   Laboratory Data:  High Sensitivity Troponin:   Recent Labs  Lab 02/10/20 1836  TROPONINIHS 8     Chemistry Recent Labs  Lab 02/11/20 0723 02/12/20 0519 02/13/20 0517  NA 138 139 138  K 4.4 4.5 5.0  CL 105 107 102  CO2 25 25 27   GLUCOSE 100* 119* 123*  BUN 16 14 17   CREATININE 0.90 0.91 0.94  CALCIUM 8.6* 8.4* 8.8*  GFRNONAA 58* 57* 55*  ANIONGAP 8 7 9     Recent Labs  Lab 02/10/20 1836  PROT 7.7  ALBUMIN 3.9  AST 19  ALT 13  ALKPHOS 64  BILITOT 1.0   Hematology Recent Labs  Lab 02/11/20 0723 02/12/20 0519 02/13/20 0517  WBC 8.5 8.2 8.4  RBC 3.45* 3.17* 3.38*  HGB 11.3* 10.3* 11.0*  HCT 35.8* 33.5* 35.8*  MCV 103.8* 105.7* 105.9*  MCH 32.8 32.5 32.5  MCHC 31.6 30.7 30.7  RDW 12.7 12.6 12.4  PLT 165 153 167   BNP Recent Labs  Lab 02/10/20 1836  BNP 571.7*    DDimer No results for input(s): DDIMER in the last 168 hours.   Radiology/Studies:  CT Head Wo Contrast  Result Date: 02/10/2020 CLINICAL DATA:  Dizziness.  Mental status change. EXAM: CT HEAD WITHOUT CONTRAST TECHNIQUE: Contiguous axial images were obtained from the base of the skull through the vertex without intravenous contrast. COMPARISON:  None. FINDINGS: The study is mildly motion degraded. Brain: No acute large territory infarct, intracranial hemorrhage, mass, midline shift, or extra-axial fluid collection is identified. Lacunar infarcts in the left basal ganglia and left thalamus are likely chronic. Hypodensities in the cerebral white matter bilaterally are nonspecific but compatible with moderate chronic small vessel ischemic disease. A small age indeterminate cortical infarct is questioned in the left parietal lobe although this is in a region of motion and may be artifactual. There is mild lateral and third ventriculomegaly which  may reflect central predominant cerebral atrophy with hydrocephalus considered less likely. Vascular: Calcified atherosclerosis at the skull base. No hyperdense vessel. Skull: No fracture or suspicious osseous lesion. Sinuses/Orbits: Visualized paranasal sinuses and mastoid air cells are clear. Bilateral cataract extraction. Other: Likely iatrogenic venous gas in the right frontotemporal scalp. IMPRESSION: 1. No evidence of acute large territory infarct or intracranial hemorrhage. 2. Small age indeterminate left parietal cortical infarct versus artifact. 3. Moderate chronic small vessel ischemic disease. Electronically Signed   By: Logan Bores M.D.   On: 02/10/2020 21:32   CT Angio Chest PE W and/or Wo Contrast  Result Date: 02/10/2020 CLINICAL DATA:  86 year old with shortness of breath. New onset atrial fibrillation. Shortness of breath for 3 days. Low back pain. Weakness. Nausea. EXAM: CT ANGIOGRAPHY CHEST WITH CONTRAST TECHNIQUE: Multidetector CT imaging of the chest was performed using the standard protocol during bolus administration of intravenous contrast. Multiplanar CT image reconstructions and MIPs were obtained to evaluate the vascular anatomy. Performed in conjunction with CT of the abdomen and pelvis, reported separately. CONTRAST:  145mL OMNIPAQUE IOHEXOL 350 MG/ML SOLN COMPARISON:  No recent exams.  Chest CT 02/14/2012. FINDINGS: Cardiovascular: Positive for acute pulmonary embolus with filling defect in the segmental branches of the left lower lobe, series 9 image 69, and possibly a few subsegmental branches in the right lower lobe, series 9 image 71. Thromboembolic burden  is small. Probable contrast mixing in the left atrial appendage. Mild right heart dilatation as well as biatrial enlargement. Contrast refluxes into the hepatic veins and IVC. There are coronary artery calcifications. Dense aortic atherosclerosis. No aortic aneurysm. Mediastinum/Nodes: No mediastinal or hilar adenopathy. No  esophageal wall thickening. No visualized thyroid nodule. Lungs/Pleura: Mild biapical pleuroparenchymal scarring. Breathing motion artifact limits detailed parenchymal assessment. Small bilateral pleural effusions with minimal adjacent atelectasis. Subsegmental opacities in the periphery of the right lower and middle lobe favor atelectasis. There is an 8 mm ground-glass nodule at the right lung apex, series 7, image 29. No evidence of tracheal or bronchial lesions, motion partially obscures detailed assessment. No septal thickening or convincing pulmonary edema Upper Abdomen: Assessed on concurrent abdominal CT. Contrast refluxes into the hepatic veins and IVC. Musculoskeletal: There are no acute or suspicious osseous abnormalities. Review of the MIP images confirms the above findings. IMPRESSION: 1. Positive for acute pulmonary embolus in the segmental branches of the left lower lobe and possibly a few subsegmental branches in the right lower lobe. Thromboembolic burden is small. 2. Mild right heart dilatation as well as biatrial enlargement. Contrast refluxes into the hepatic veins and IVC, consistent with elevated right heart pressures. 3. Small bilateral pleural effusions with minimal adjacent atelectasis. 4. Right upper lobe 8 mm ground-glass nodule. Initial follow-up with CT at 6-12 months is recommended to confirm persistence. If persistent, repeat CT is recommended every 2 years until 5 years of stability has been established, giving consideration for patient's advanced age. This recommendation follows the consensus statement: Guidelines for Management of Incidental Pulmonary Nodules Detected on CT Images: From the Fleischner Society 2017; Radiology 2017; 284:228-243. Aortic Atherosclerosis (ICD10-I70.0). Critical Value/emergent results were called by telephone at the time of interpretation on 02/10/2020 at 9:35 pm to Dr Shirlyn Goltz , who verbally acknowledged these results. Electronically Signed   By: Keith Rake M.D.   On: 02/10/2020 21:36   CT ABDOMEN PELVIS W CONTRAST  Result Date: 02/10/2020 CLINICAL DATA:  Abdominal distension. Low back pain. Weakness. Nausea. EXAM: CT ABDOMEN AND PELVIS WITH CONTRAST TECHNIQUE: Multidetector CT imaging of the abdomen and pelvis was performed using the standard protocol following bolus administration of intravenous contrast. CONTRAST:  174mL OMNIPAQUE IOHEXOL 350 MG/ML SOLN COMPARISON:  None. FINDINGS: Lower chest: Assessed on concurrent chest CT, reported separately. Small bilateral pleural effusions. Hepatobiliary: Motion artifact limitations. No focal liver abnormality is seen. Gallbladder is not well-defined on the current exam, may be completely decompressed or surgically absent. There is no biliary dilatation. Pancreas: Motion limited evaluation. No evidence of peripancreatic inflammation. No ductal dilatation. No evidence of pancreatic mass. Spleen: Normal in size without focal abnormality. Adrenals/Urinary Tract: Normal adrenal glands. No hydronephrosis or perinephric edema. Simple cyst in the upper left kidney. Subcentimeter cortical hypodensity in the mid anterior right kidney is too small to characterize. Urinary bladder is completely empty. Stomach/Bowel: Bowel evaluation is limited due to motion and absence of enteric contrast. No abnormal gastric distension. Small duodenal diverticulum without inflammation. There is no bowel dilatation to suggest obstruction. No evidence of bowel inflammation. Colonic diverticulosis is prominent in the sigmoid colon. There is no diverticulitis or pericolonic edema. Normal appendix. Vascular/Lymphatic: Prominent aortic and branch atherosclerosis. No aortic aneurysm. The portal vein is patent. No abdominopelvic adenopathy. Reproductive: Status post hysterectomy. No adnexal masses. Other: No free air or ascites.  No body wall hernia. Musculoskeletal: Multilevel degenerative change throughout the lumbar spine with near complete  disc space loss at L2-L3,  L3-L4, and L4-L5. There is multilevel facet hypertrophy. Bones are under mineralized. No acute osseous findings. No compression fracture. IMPRESSION: 1. No acute abnormality in the abdomen/pelvis, allowing for motion artifact. 2. Colonic diverticulosis without diverticulitis. 3. Multilevel degenerative change throughout the lumbar spine. Aortic Atherosclerosis (ICD10-I70.0). Electronically Signed   By: Keith Rake M.D.   On: 02/10/2020 21:40   DG CHEST PORT 1 VIEW  Result Date: 02/14/2020 CLINICAL DATA:  Shortness of breath EXAM: PORTABLE CHEST 1 VIEW COMPARISON:  03/21/2016 in CT of the chest of 02/10/2020 FINDINGS: Image rotated to the RIGHT. Accounting for this there is mild cardiac enlargement. Increasing density along the LEFT heart border with profile that suggests potential asymmetric soft tissue, perhaps related to breast tissue though with blunting of both RIGHT and LEFT costodiaphragmatic sulcus and graded opacity in the RIGHT and LEFT chest. Obscured LEFT hemidiaphragm with partially obscured RIGHT hemidiaphragm on today's study. No acute skeletal process on limited assessment with signs of prior LEFT humeral fracture partially imaged. IMPRESSION: 1. Cardiomegaly with bilateral pleural effusions and basilar opacities likely atelectasis or infiltrate. 2. Increased density along the LEFT heart border with profile that may represent asymmetric soft tissue related to patient rotation and positioning. Worsening effusion or space process is also considered, attention on follow-up. Electronically Signed   By: Zetta Bills M.D.   On: 02/14/2020 08:13   ECHOCARDIOGRAM COMPLETE  Result Date: 02/11/2020    ECHOCARDIOGRAM REPORT   Patient Name:   AMIAYAH GIEBEL [facility] Date of Exam: 02/11/2020 Medical Rec #:  [id number]      Height:       65.0 in Accession #:    [id number]     Weight:       128.1 lb Date of Birth:  03/29/1921      BSA:          1.637 m Patient Age:    58 years       BP:            105/77 mmHg Patient Gender: F              HR:           120 bpm. Exam Location:  Inpatient Procedure: 2D Echo, Cardiac Doppler and Color Doppler Indications:    Pulmonary Embolus, Atrial fibrillation  History:        Patient has prior history of Echocardiogram examinations, most                 recent 03/30/2016. CHF, CAD, Arrythmias:Atrial Fibrillation,                 Signs/Symptoms:Shortness of Breath; Risk Factors:Hypertension.  Sonographer:    Dustin Flock Referring Phys: [phone] [place]  1. Left ventricular ejection fraction, by estimation, is 60 to 65%. The left ventricle has normal function. The left ventricle has no regional wall motion abnormalities. There is mild left ventricular hypertrophy. Left ventricular diastolic function could not be evaluated.  2. Right ventricular systolic function is normal. The right ventricular size is normal. There is moderately elevated pulmonary artery systolic pressure. The estimated right ventricular systolic pressure is 12.4 mmHg.  3. The mitral valve is abnormal. Moderate mitral valve regurgitation.  4. The aortic valve is tricuspid. Aortic valve regurgitation is not visualized. Mild aortic valve sclerosis is present, with no evidence of aortic valve stenosis.  5. The inferior vena cava is normal in size with <50% respiratory variability, suggesting right atrial pressure of 8 mmHg.  Comparison(s): Prior images unable to be directly viewed, comparison made by report only. 03/30/2016: LVEF 65-70%, grade 1 DD with high LV filling pressure, trivial MR, RVSP 28 mmHg. FINDINGS  Left Ventricle: Left ventricular ejection fraction, by estimation, is 60 to 65%. The left ventricle has normal function. The left ventricle has no regional wall motion abnormalities. The left ventricular internal cavity size was normal in size. There is  mild left ventricular hypertrophy. Left ventricular diastolic function could not be evaluated due to atrial  fibrillation. Left ventricular diastolic function could not be evaluated. Right Ventricle: The right ventricular size is normal. No increase in right ventricular wall thickness. Right ventricular systolic function is normal. There is moderately elevated pulmonary artery systolic pressure. The tricuspid regurgitant velocity is 3.41 m/s, and with an assumed right atrial pressure of 8 mmHg, the estimated right ventricular systolic pressure is 40.9 mmHg. Left Atrium: Left atrial size was normal in size. Right Atrium: Right atrial size was normal in size. Pericardium: There is no evidence of pericardial effusion. Mitral Valve: The mitral valve is abnormal. There is mild thickening of the mitral valve leaflet(s). Mild to moderate mitral annular calcification. Moderate mitral valve regurgitation, with posteriorly-directed jet. Tricuspid Valve: The tricuspid valve is grossly normal. Tricuspid valve regurgitation is mild. Aortic Valve: The aortic valve is tricuspid. Aortic valve regurgitation is not visualized. Mild aortic valve sclerosis is present, with no evidence of aortic valve stenosis. Pulmonic Valve: The pulmonic valve was grossly normal. Pulmonic valve regurgitation is trivial. Aorta: The aortic root and ascending aorta are structurally normal, with no evidence of dilitation. Venous: The inferior vena cava is normal in size with less than 50% respiratory variability, suggesting right atrial pressure of 8 mmHg. IAS/Shunts: No atrial level shunt detected by color flow Doppler.  LEFT VENTRICLE PLAX 2D LVIDd:         3.30 cm  Diastology LVIDs:         2.20 cm  LV e' medial:    6.20 cm/s LV PW:         1.10 cm  LV E/e' medial:  20.2 LV IVS:        1.10 cm  LV e' lateral:   9.90 cm/s LVOT diam:     1.90 cm  LV E/e' lateral: 12.6 LV SV:         46 LV SV Index:   28 LVOT Area:     2.84 cm  RIGHT VENTRICLE RV Basal diam:  2.90 cm RV S prime:     10.60 cm/s TAPSE (M-mode): 3.0 cm LEFT ATRIUM             Index       RIGHT  ATRIUM           Index LA diam:        4.10 cm 2.50 cm/m  RA Area:     14.90 cm LA Vol (A2C):   42.6 ml 26.02 ml/m RA Volume:   32.70 ml  19.98 ml/m LA Vol (A4C):   52.9 ml 32.32 ml/m LA Biplane Vol: 47.8 ml 29.20 ml/m  AORTIC VALVE LVOT Vmax:   80.20 cm/s LVOT Vmean:  56.700 cm/s LVOT VTI:    0.163 m  AORTA Ao Root diam: 2.70 cm MITRAL VALVE                TRICUSPID VALVE MV Area (PHT): 3.08 cm     TR Peak grad:   46.5 mmHg MV Decel Time: 246 msec  TR Vmax:        341.00 cm/s MV E velocity: 125.00 cm/s                             SHUNTS                             Systemic VTI:  0.16 m                             Systemic Diam: 1.90 cm Lyman Bishop MD Electronically signed by Lyman Bishop MD Signature Date/Time: 02/11/2020/10:36:48 AM    Final    VAS [place] LOWER EXTREMITY VENOUS (DVT)  Result Date: 02/11/2020  Lower Venous DVT Study Indications: Pulmonary embolism.  Risk Factors: None identified. Limitations: Poor ultrasound/tissue interface. Comparison Study: No prior studies. Performing Technologist: Oliver Hum RVT  Examination Guidelines: A complete evaluation includes B-mode imaging, spectral Doppler, color Doppler, and power Doppler as needed of all accessible portions of each vessel. Bilateral testing is considered an integral part of a complete examination. Limited examinations for reoccurring indications may be performed as noted. The reflux portion of the exam is performed with the patient in reverse Trendelenburg.  +---------+---------------+---------+-----------+----------+--------------+ RIGHT    CompressibilityPhasicitySpontaneityPropertiesThrombus Aging +---------+---------------+---------+-----------+----------+--------------+ CFV      Full           Yes      Yes                                 +---------+---------------+---------+-----------+----------+--------------+ SFJ      Full                                                         +---------+---------------+---------+-----------+----------+--------------+ FV Prox  Full                                                        +---------+---------------+---------+-----------+----------+--------------+ FV Mid   Full                                                        +---------+---------------+---------+-----------+----------+--------------+ FV DistalFull                                                        +---------+---------------+---------+-----------+----------+--------------+ PFV      Full                                                        +---------+---------------+---------+-----------+----------+--------------+ POP  Full           Yes      Yes                                 +---------+---------------+---------+-----------+----------+--------------+ PTV      Full                                                        +---------+---------------+---------+-----------+----------+--------------+ PERO     Full                                                        +---------+---------------+---------+-----------+----------+--------------+   +---------+---------------+---------+-----------+----------+--------------+ LEFT     CompressibilityPhasicitySpontaneityPropertiesThrombus Aging +---------+---------------+---------+-----------+----------+--------------+ CFV      Full           Yes      Yes                                 +---------+---------------+---------+-----------+----------+--------------+ SFJ      Full                                                        +---------+---------------+---------+-----------+----------+--------------+ FV Prox  Full                                                        +---------+---------------+---------+-----------+----------+--------------+ FV Mid   Full                                                         +---------+---------------+---------+-----------+----------+--------------+ FV DistalFull                                                        +---------+---------------+---------+-----------+----------+--------------+ PFV      Full                                                        +---------+---------------+---------+-----------+----------+--------------+ POP      Full           Yes      Yes                                 +---------+---------------+---------+-----------+----------+--------------+  PTV      Full                                                        +---------+---------------+---------+-----------+----------+--------------+ PERO     Full                                                        +---------+---------------+---------+-----------+----------+--------------+     Summary: RIGHT: - There is no evidence of deep vein thrombosis in the lower extremity.  - No cystic structure found in the popliteal fossa.  LEFT: - There is no evidence of deep vein thrombosis in the lower extremity.  - No cystic structure found in the popliteal fossa.  *See table(s) above for measurements and observations. Electronically signed by Deitra Mayo MD on 02/11/2020 at 5:52:53 PM.    Final      Assessment and Plan:   1. New onset atrial fibrillation: patient presented with SOB, found to have an acute RUL PE. Started on heparin and transitioned to eliquis 5mg  BID. BBlocker initially held on admit, restarted 11/3, and uptitrated to 100mg  BID today with HR's still in the 110s at times. Suspect this is driven by PE. Baseline rhythm is sinus rhythm with 1st degree AV block. Anticipate tachycardia will improve as she recovers from her PE.  - Can continue metoprolol 100mg  BID for rate control - Currently on apixaban 5mg  BID given concomitant PE - would be reasonable to de-escalate to eliquis 2.5mg  BID for stroke ppx once management of PE is complete (dose adjusted for age  and wt <60kg)  2. Acute PE: patient presented with SOB, found to have an acute RUL PE. Started on heparin and transitioned to eliquis 5mg  BID. She continues to have O2 via [place] demand.  - Continue management per primary team  3. CAD s/p PCI to LAD in 2009 and PCI/DES to RCA in 2011: no anginal complaints. Aspirin discontinued now that anticoagulation is warranted.  - Continue statin   4. Chronic diastolic CHF: appears well compensated.  - Continue BBlocker  5. HTN: BP is stable. Home amlodipine and enalapril on held this admission - Continue BBlocker - Can resume home medications as needed based on BP    For questions or updates, please contact [facility] Please consult [email or web address] for contact info under    Signed, Donato Heinz, MD  02/14/2020 6:30 PM   Roby Lofts, PA

## 2020-02-14 NOTE — NC FL2 (Signed)
[place] LEVEL OF CARE SCREENING TOOL     IDENTIFICATION  Patient Name: [name] Birthdate: Mar 01, 1921 Sex: female Admission Date (Current Location): 02/10/2020  [facility] and [place] Number:  Herbalist and Address:  [facility],  [place] [address] [place]      Provider Number: [id number]  Attending Physician Name and Address:  Aline August, MD  Relative Name and Phone Number:  Signe Colt (dtr in law) [phone]    Current Level of Care: Hospital Recommended Level of Care: [place] Prior Approval Number:    Date Approved/Denied:   PASRR Number: [id number]  Discharge Plan: SNF    Current Diagnoses: Patient Active Problem List   Diagnosis Date Noted  . Acute pulmonary embolism ([place]) 02/10/2020  . Chronic diastolic CHF (congestive heart failure) ([place]) 02/10/2020  . New onset atrial fibrillation ([place]) 02/10/2020  . Lung nodule 02/10/2020  . History of echocardiogram   . GERD (gastroesophageal reflux disease)   . Diverticulosis   . Carotid bruit   . IBS (irritable bowel syndrome)   . Hypothyroidism   . HTN (hypertension)   . HLD (hyperlipidemia)   . CAD (coronary artery disease)   . Atypical chest pain 06/14/2012  . Chest pain 02/12/2012  . Fatigue 04/22/2011  . CAROTID BRUIT 03/19/2009  . HYPOTHYROIDISM 08/29/2008  . HEMORRHOIDS 08/29/2008  . GERD 08/29/2008  . DIVERTICULOSIS, COLON 08/29/2008  . HYPERLIPIDEMIA 07/04/2008  . HYPERTENSION, BENIGN 07/04/2008  . CAD, NATIVE VESSEL 07/04/2008  . IRRITABLE BOWEL SYNDROME 07/04/2008    Orientation RESPIRATION BLADDER Height & Weight     Self, Time, Situation, Place  O2 Continent Weight: 57.4 kg Height:  5\' 5"  (165.1 cm)  BEHAVIORAL SYMPTOMS/MOOD NEUROLOGICAL BOWEL NUTRITION STATUS      Continent Diet (Heart Healthy)  AMBULATORY STATUS COMMUNICATION OF NEEDS Skin   Limited Assist Verbally Normal                       Personal  Care Assistance Level of Assistance  Bathing, Feeding, Dressing Bathing Assistance: Limited assistance Feeding assistance: Limited assistance Dressing Assistance: Limited assistance     Functional Limitations Info  Sight, Hearing, Speech Sight Info: Impaired (eyeglasses-not wearing them currently) Hearing Info: Adequate Speech Info: Impaired (Dentures-top/bottom)    SPECIAL CARE FACTORS FREQUENCY  PT (By licensed PT), OT (By licensed OT)     PT Frequency: 5x week OT Frequency: 5x week            Contractures Contractures Info: Not present    Additional Factors Info  Code Status, Allergies, Psychotropic Code Status Info:  (DNR) Allergies Info:  (NKA) Psychotropic Info:  Hollace Hayward MAR)         Current Medications (02/14/2020):  This is the current hospital active medication list Current Facility-Administered Medications  Medication Dose Route Frequency Provider Last Rate Last Admin  . 0.9 %  sodium chloride infusion  250 mL Intravenous PRN Opyd, Ilene Qua, MD      . acetaminophen (TYLENOL) tablet 650 mg  650 mg Oral Q6H PRN Opyd, Ilene Qua, MD   650 mg at 02/14/20 0245   Or  . acetaminophen (TYLENOL) suppository 650 mg  650 mg Rectal Q6H PRN Opyd, Ilene Qua, MD      . apixaban (ELIQUIS) tablet 5 mg  5 mg Oral BID Aline August, MD   5 mg at 02/14/20 1016  . levothyroxine (SYNTHROID) tablet 100 mcg  100 mcg  Oral [id number] Vianne Bulls, MD   100 mcg at 02/14/20 6761  . metoprolol tartrate (LOPRESSOR) tablet 100 mg  100 mg Oral BID Aline August, MD   100 mg at 02/14/20 1014  . mirabegron ER (MYRBETRIQ) tablet 50 mg  50 mg Oral QPM Opyd, Ilene Qua, MD   50 mg at 02/13/20 1721  . ondansetron (ZOFRAN) tablet 4 mg  4 mg Oral Q6H PRN Opyd, Ilene Qua, MD       Or  . ondansetron (ZOFRAN) injection 4 mg  4 mg Intravenous Q6H PRN Opyd, Ilene Qua, MD   4 mg at 02/10/20 2335  . polyethylene glycol (MIRALAX / GLYCOLAX) packet 17 g  17 g Oral Daily PRN Opyd, Ilene Qua, MD      .  simvastatin (ZOCOR) tablet 20 mg  20 mg Oral QHS Opyd, Ilene Qua, MD   20 mg at 02/14/20 0232  . sodium chloride flush (NS) 0.9 % injection 3 mL  3 mL Intravenous Q12H Opyd, Ilene Qua, MD   3 mL at 02/14/20 1018  . sodium chloride flush (NS) 0.9 % injection 3 mL  3 mL Intravenous Q12H Opyd, Ilene Qua, MD   3 mL at 02/14/20 1018  . sodium chloride flush (NS) 0.9 % injection 3 mL  3 mL Intravenous PRN Opyd, Ilene Qua, MD      . traZODone (DESYREL) tablet 50 mg  50 mg Oral QHS Opyd, Ilene Qua, MD   50 mg at 02/12/20 2149  . trimethoprim (TRIMPEX) tablet 100 mg  100 mg Oral Daily Opyd, Ilene Qua, MD   100 mg at 02/14/20 1016     Discharge Medications: Please see discharge summary for a list of discharge medications.  Relevant Imaging Results:  Relevant Lab Results:   Additional Information SS#[id number]  Mikhia Dusek, Juliann Pulse, RN

## 2020-02-14 NOTE — Progress Notes (Signed)
Patient ID: [name], female   DOB: 02/28/1921, 86 y.o.   MRN: [id number]  PROGRESS NOTE    [name]  OMB:[id number] DOB: December 16, 1920 DOA: 02/10/2020 PCP: Holland Commons, FNP   Brief Narrative:  86 year old female with history of hypertension, CAD, chronic diastolic CHF, hypothyroidism and recurrent UTI presented with shortness of breath, aches and malaise.  On presentation, she was saturating in the mid to upper 80s on room air, tachycardic in the 110s, blood pressure was stable.  EKG showed atrial fibrillation.  CT of the head was negative for any acute intracranial abnormality.  CT of the abdomen and pelvis showed no acute findings.  CTA chest showed acute pulmonary embolism and right upper lobe nodule.  COVID-19 test was negative.  She was started on IV heparin.  She was subsequently switched to oral Eliquis on 02/12/2020.  Assessment & Plan:   Acute PE Acute hypoxic respiratory failure -CTA of the chest showed acute pulmonary embolism.  Initially started on IV heparin but was switched to oral Eliquis on 02/12/2020. -Lower extremity duplex did not show any evidence of lower extremity DVT.  Echo showed EF of 60 to 65% with moderate MR -Currently on 4 L oxygen by nasal cannula.  Wean off as able.  Might need supplemental oxygen on discharge.  Acute on chronic diastolic CHF -Continue metoprolol.  Strict input and output.  Daily weights. -Patient got a dose of IV Lasix on 02/13/2020.  Will check chest x-ray this morning and might need to give another dose.  Paroxysmal A. fib with RVR -Likely precipitated by PE.  Still tachycardic.  Increase metoprolol to 100 mg twice a day.  Consult cardiology.  Anticoagulant plan as above.  Essential hypertension -Blood pressure improving.  Continue metoprolol as above.  Hypothyroidism -Continue Synthroid  CKD IIIa -creatinine stable  Lung nodule -Incidentally noted on CT on presentation.  Outpatient follow-up  Generalized  deconditioning -PT recommends SNF placement.  Social worker consulted.  Palliative care consultation for goals of care discussion is pending   DVT prophylaxis:  Eliquis Code Status: DNR Family Communication: None at bedside Disposition Plan: Status is: Inpatient  Remains inpatient appropriate because:Inpatient level of care appropriate due to severity of illness   Dispo: The patient is from: Home              Anticipated d/c is to: SNF              Anticipated d/c date is: 1 day              Patient currently is not medically stable to d/c.  Consultants: None  Procedures: Echo/lower extremity duplex  Antimicrobials: None   Subjective: Patient seen and examined at bedside.  Poor historian.  Wakes up slightly, does not participate in conversation much.  Slightly confused to time.  No overnight fever, vomiting reported.  Objective: Vitals:   02/14/20 0224 02/14/20 0238 02/14/20 0351 02/14/20 0553  BP: (!) 141/97 (!) 141/78  105/73  Pulse: (!) 107 93  88  Resp: 18 (!) 22  20  Temp: 98.3 F (36.8 C) 98 F (36.7 C)  (!) 97.4 F (36.3 C)  TempSrc: Oral Oral  Oral  SpO2: (!) 84% 93%  98%  Weight:   57.4 kg   Height:        Intake/Output Summary (Last 24 hours) at 02/14/2020 0745 Last data filed at 02/14/2020 0352 Gross per 24 hour  Intake 95 ml  Output 1050 ml  Net -  955 ml   Filed Weights   02/12/20 0409 02/13/20 0632 02/14/20 0351  Weight: 57.6 kg 58.1 kg 57.4 kg    Examination:  General exam: No acute distress.  Elderly female lying in bed.  Currently on 4 L oxygen via nasal cannula Respiratory system: Bilateral decreased breath sounds at bases with scattered crackles.  Tachypneic intermittently  cardiovascular system: S1-S2 heard, currently still tachycardic Gastrointestinal system: Abdomen is nondistended, soft and nontender.  Normal bowel sounds are heard  extremities: No cyanosis.  Trace lower extremity edema present Central nervous system: Extremely poor  historian.  Does not engage in conversation much.  Wakes up slightly.  Slightly confused to time.  No focal neurological deficits.  Moves extremities Skin: No obvious rashes/lesions noted Psychiatry: Affect is flat   Data Reviewed: I have personally reviewed following labs and imaging studies  CBC: Recent Labs  Lab 02/10/20 1836 02/11/20 0723 02/12/20 0519 02/13/20 0517  WBC 10.5 8.5 8.2 8.4  NEUTROABS 6.9  --   --  5.9  HGB 11.7* 11.3* 10.3* 11.0*  HCT 36.9 35.8* 33.5* 35.8*  MCV 103.4* 103.8* 105.7* 105.9*  PLT 182 165 153 409   Basic Metabolic Panel: Recent Labs  Lab 02/10/20 1836 02/11/20 0723 02/12/20 0519 02/13/20 0517 02/14/20 0517  NA 141 138 139 138  --   K 4.2 4.4 4.5 5.0  --   CL 103 105 107 102  --   CO2 26 25 25 27   --   GLUCOSE 120* 100* 119* 123*  --   BUN 18 16 14 17   --   CREATININE 0.97 0.90 0.91 0.94  --   CALCIUM 9.3 8.6* 8.4* 8.8*  --   MG  --  2.4 2.2 2.4 2.0  PHOS  --   --  3.3  --   --    GFR: Estimated Creatinine Clearance: 30.8 mL/min (by C-G formula based on SCr of 0.94 mg/dL). Liver Function Tests: Recent Labs  Lab 02/10/20 1836  AST 19  ALT 13  ALKPHOS 64  BILITOT 1.0  PROT 7.7  ALBUMIN 3.9   No results for input(s): LIPASE, AMYLASE in the last 168 hours. No results for input(s): AMMONIA in the last 168 hours. Coagulation Profile: Recent Labs  Lab 02/10/20 2318  INR 1.1   Cardiac Enzymes: No results for input(s): CKTOTAL, CKMB, CKMBINDEX, TROPONINI in the last 168 hours. BNP (last 3 results) No results for input(s): PROBNP in the last 8760 hours. HbA1C: No results for input(s): HGBA1C in the last 72 hours. CBG: No results for input(s): GLUCAP in the last 168 hours. Lipid Profile: No results for input(s): CHOL, HDL, LDLCALC, TRIG, CHOLHDL, LDLDIRECT in the last 72 hours. Thyroid Function Tests: Recent Labs    02/11/20 1411  FREET4 1.51*  T3FREE 2.2   Anemia Panel: No results for input(s): VITAMINB12, FOLATE,  FERRITIN, TIBC, IRON, RETICCTPCT in the last 72 hours. Sepsis Labs: No results for input(s): PROCALCITON, LATICACIDVEN in the last 168 hours.  Recent Results (from the past 240 hour(s))  Urine Culture     Status: None   Collection Time: 02/10/20  6:00 PM   Specimen: Urine, Random  Result Value Ref Range Status   Specimen Description   Final    URINE, RANDOM Performed at [facility] [address], [place], [place] [address]    Special Requests   Final    NONE Performed at [facility], [place] [address] [place], [place] [address]  Culture   Final    NO GROWTH Performed at [facility], [place] [address], [place], [place] [address]    Report Status 02/11/2020 FINAL  Final  Respiratory Panel by RT PCR (Flu A&B, Covid) - Nasopharyngeal Swab     Status: None   Collection Time: 02/10/20  6:00 PM   Specimen: Nasopharyngeal Swab  Result Value Ref Range Status   SARS Coronavirus 2 by RT PCR NEGATIVE NEGATIVE Final    Comment: (NOTE) SARS-CoV-2 target nucleic acids are NOT DETECTED.  The SARS-CoV-2 RNA is generally detectable in upper respiratoy specimens during the acute phase of infection. The lowest concentration of SARS-CoV-2 viral copies this assay can detect is 131 copies/mL. A negative result does not preclude SARS-Cov-2 infection and should not be used as the sole basis for treatment or other patient management decisions. A negative result may occur with  improper specimen collection/handling, submission of specimen other than nasopharyngeal swab, presence of viral mutation(s) within the areas targeted by this assay, and inadequate number of viral copies (<131 copies/mL). A negative result must be combined with clinical observations, patient history, and epidemiological information. The expected result is Negative.  Fact Sheet for Patients:  PinkCheek.be  Fact Sheet for Healthcare Providers:    GravelBags.it  This test is no t yet approved or cleared by the [place] FDA and  has been authorized for detection and/or diagnosis of SARS-CoV-2 by FDA under an Emergency Use Authorization (EUA). This EUA will remain  in effect (meaning this test can be used) for the duration of the COVID-19 declaration under Section 564(b)(1) of the Act, 21 U.S.C. section 360bbb-3(b)(1), unless the authorization is terminated or revoked sooner.     Influenza A by PCR NEGATIVE NEGATIVE Final   Influenza B by PCR NEGATIVE NEGATIVE Final    Comment: (NOTE) The Xpert Xpress SARS-CoV-2/FLU/RSV assay is intended as an aid in  the diagnosis of influenza from Nasopharyngeal swab specimens and  should not be used as a sole basis for treatment. Nasal washings and  aspirates are unacceptable for Xpert Xpress SARS-CoV-2/FLU/RSV  testing.  Fact Sheet for Patients: PinkCheek.be  Fact Sheet for Healthcare Providers: GravelBags.it  This test is not yet approved or cleared by the [place] FDA and  has been authorized for detection and/or diagnosis of SARS-CoV-2 by  FDA under an Emergency Use Authorization (EUA). This EUA will remain  in effect (meaning this test can be used) for the duration of the  Covid-19 declaration under Section 564(b)(1) of the Act, 21  U.S.C. section 360bbb-3(b)(1), unless the authorization is  terminated or revoked. Performed at [facility], [place] [address] [place], [place] [address]          Radiology Studies: No results found.      Scheduled Meds: . apixaban  5 mg Oral BID  . levothyroxine  100 mcg Oral Q0600  . metoprolol tartrate  75 mg Oral BID  . mirabegron ER  50 mg Oral QPM  . simvastatin  20 mg Oral QHS  . sodium chloride flush  3 mL Intravenous Q12H  . sodium chloride flush  3 mL Intravenous Q12H  . traZODone  50 mg Oral QHS  . trimethoprim   100 mg Oral Daily   Continuous Infusions: . sodium chloride            Aline August, MD [facility] 02/14/2020, 7:45 AM

## 2020-02-14 NOTE — Progress Notes (Signed)
Hydrologist [facility])  Hospital Liaison: RN note         Notified by [facility] manager of patient/family request for [facility] Palliative services at [facility] after discharge.              [place] Palliative team will follow up with patient SNF after discharge.         Please call with any hospice or palliative related questions.         Thank you for this referral.         Farrel Gordon, RN, CCM  [place] (listed on [facility] under Hospice[facility])    [phone]

## 2020-02-14 NOTE — TOC Progression Note (Addendum)
Transition of Care [facility]) - Progression Note    Patient Details  Name: [name] MRN: [id number] Date of Birth: 1921-02-19  Transition of Care [facility]) CM/SW Contact  Trini Soldo, Juliann Pulse, RN Phone Number: 02/14/2020, 1:00 PM  Clinical Narrative: Bed offers given-await choice. [facility] for SNF/PTAR w/HTA.Authora care following for [facility] @ SNF.Dtr n law [name] main contact [phone]. 2:54p-Family chose [place] [facility] for SNF(Call Irine Seal if d/c in am-c#[phone])-informed HTA-still awaiting auth.    1. 1.7 mi [place] at [address] [place], [place] [address] [phone] Overall rating Average 2. 2 mi [facility] at the [place], [place] [address] [phone] Overall rating Below average 3. 2.1 mi [place] at [place], [place] [address] [phone] Overall rating Much below average 4. 2.2 mi [place] A [employer] [place], [place] [address] [phone] Overall rating Much above average 5. 2.2 mi [place], [place] [address] [phone] Overall rating Much below average 6. 2.7 [place], [place] [address] [phone] Overall rating Much above average 7. 3.2 mi [place] [address] [place], [place] [address] [phone] Overall rating Average 8. 3.3 mi [facility] [address] [place], [place] [address] [phone] Overall rating Much below average 9. 3.5 mi [facility] [place], [place] [address] [phone] Overall rating Below average 10. 4.3 [place], [place] [address] [phone] Overall  rating Below average 11. 4.7 mi [facility] at [place], [place] [address] [phone] Overall rating Much above average 12. 5.2 mi [facility] [address] [place], [place] [address] [phone] Overall rating Much above average 13. 6.2 mi [place], [place] [address] [phone] Overall rating Average 14. 8.1 Old [place], [place] [address] [phone] Overall rating Above average 15. 8.1 mi [facility] and [place]y [place], [place] [address] [phone] Overall rating Much below average 16. 9.7 mi [facility] [address] [place], [place] [address] [phone] Overall rating Average 17. 10 mi [facility] [address] [place], [place] [address] [phone] Overall rating Much above average 18. 11.4 mi [place] at [facility] [address] [place], [place] [address] [phone] Overall rating Much above average 19. 13.5 [facility] [address] [place], [place] [address] [phone] Overall rating Much below average 20. 13.5 mi [facility] [address] [place], [place] [address] [phone] Overall rating Much below average 21. 14.5 [place] [address] [place], [place] [address] [phone] Overall rating Much below average 22. 14.9 mi The [place] [address] [place], [place] [address] [phone] Overall rating Much below average 23. 15.1 mi [facility] at [place], [place] [address] [phone] Overall rating Much below average 24. 15.3 mi [facility] and [facility] [place],  [address] [phone] Overall rating Below average 25. East [place] [employer]  [place], [place] [address] [phone] Overall rating Much above average 26. 16.2 mi [facility] [place], [place] [address] [phone] Overall rating Much above average 27. 16.6 mi [place] [address] [place], [place] [address] [phone] Overall rating Below average 28. 17.2 mi [place], [place] [address] [phone] Overall rating Average 29. 94.7 [facility] [address] [place], [place] [address] [phone] Overall rating Much below average 30. 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 20.6 [place] and [facility] [address] [place], [place] [address] [phone] Overall rating Much below average 32. 20.7 [place] and [facility] [place], [place] [address] [phone] Overall rating Below average 33. 00.7 [facility] [address] [place], [place] [address] [phone] Overall rating Below average 34. 21.1 [place], [place] [address] [phone] Overall rating Much above average 35. [place] [address] [place], [place] [address] [phone] Overall rating Below average 36. 22.4 mi [address] [place], [place] [address] [phone] Overall rating Average 37. 22.7 mi [facility] [address] [place], [place] [address] [phone] Overall rating Above average 38. 22.8 [address] [place], [place] [address] [phone] Overall rating Much above average 39. 22.9  [place], [place] [address] [phone] Overall rating Much below average 40. [place], [place] [address] [phone] Overall rating Much below average 41. 24.4 mi [facility] and [facility] [address] [place], [place] [address] [phone] Overall rating Below average 42. 24.6 [facility] [address] [place], [place] [address] [phone] Overall rating Much below average 43. 24.6 mi [place], [place] [address] [phone] Overall rating Below average 44. 24.9 mi [facility] [address] [place], [place] [address] [phone]   Expected Discharge Plan: [place] Barriers to Discharge: Continued Medical Work up  Expected Discharge Plan and Services Expected Discharge Plan: [place]   Discharge Planning Services: CM Consult Post Acute Care Choice: [place] arrangements for the past 2 months: Single Family Home                                       Social Determinants of Health (SDOH) Interventions    Readmission Risk Interventions No flowsheet data found.

## 2020-02-14 NOTE — Consult Note (Addendum)
Consultation Note Date: 02/14/2020   Patient Name: [name]  DOB: [birth year]  MRN: [id number]  Age / Sex: 86 y.o., female  PCP: [place], [place] Referring Physician: Aline August, MD  Reason for Consultation: Establishing goals of care  HPI/Patient Profile: 86 y.o. female  with past medical history of hypertension, CAD, chronic diastolic CHF, hypothyroidism recurrent UTI admitted on 02/10/2020 with shortness of breath, aches, and malaise.  She was found to be tachycardic and saturating in the mid 80s and CT showed acute pulmonary embolus right upper lobe.  COVID-19 was negative.  She was started on IV heparin dose; transition to oral Eliquis.  Palliative consulted for goals of care..   Clinical Assessment and Goals of Care: I met today with patient and her son, [name].   [name] reports having a good understanding her situation.  She had been living with them, but he states that they cannot care for her in her current condition.  By moving for discharges into skilled facility and see how she does for the next couple of weeks.  He is certainly open to palliative care following her at facility in order to continue conversation based upon her clinical course.  We discussed clinical course as well as wishes moving forward in regard to advanced directives.  Concepts specific to code status and rehospitalization discussed.  We discussed difference between a aggressive medical intervention path and a palliative, comfort focused care path.  Values and goals of care important to patient and family were attempted to be elicited.  [name] reports that he has had these conversations with providers in the past and he shared a MOST form that had been completed prior.    Questions and concerns addressed.   PMT will continue to support holistically.  SUMMARY OF RECOMMENDATIONS   -DNR/DNI -She has a completed MOST form:  DNR, comfort care, no antibiotics, no IV fluids, no feeding tube.  I will submit a copy of this to be placed in her chart. -Plan to skilled facility for trial of rehab.  Her son understands that there is high likelihood she may continue to decline.  He is agreeable to palliative care continue to follow to skilled facility to continue conversation based upon her clinical course in the next couple weeks.  Code Status/Advance Care Planning:  DNR   Psycho-social/Spiritual:   Desire for further Chaplaincy support:no  Additional Recommendations: Education on Hospice  Prognosis:   < 6 months  Discharge Planning: [place] for rehab with Palliative care service follow-up      Primary Diagnoses: Present on Admission: . Acute pulmonary embolism ([place]) . CAD, NATIVE VESSEL . HYPERTENSION, BENIGN . Hypothyroidism . Chronic diastolic CHF (congestive heart failure) ([place]) . New onset atrial fibrillation ([place]) . Lung nodule   I have reviewed the medical record, interviewed the patient and family, and examined the patient. The following aspects are pertinent.  Past Medical History:  Diagnosis Date  . CAD (coronary artery disease)    a. 07/2007 PCI/Promus DES to LAD;  b. 02/2010 PCI/Promus DES to dRCA.;  c. 12/2010 Lexi MV: EF 88%, no isch/infarct.;  d. 02/2012 Cath: LM nl, LAD 20p, patent mid stent, 60d, D1 sm 70ost, D2 sm, diff, LCX sm , OM1 diff 16m(no change), RCA 40ost, patent stent, PD/PL nl, EF 75% w/o rwma's.  . Carotid bruit   . Diverticulosis   . GERD (gastroesophageal reflux disease)   . Hemorrhoids   . History of echocardiogram    a. 06/2012 Echo: EF nl, mild LVH.  .Marland KitchenHLD (hyperlipidemia)   . HTN (hypertension)   . Hypothyroidism   . IBS (irritable bowel syndrome)    Social History   Socioeconomic History  . Marital status: Widowed    Spouse name: Not on file  . Number of children: 4  . Years of education: Not on file  . Highest education level: Not on  file  Occupational History  . Occupation: retired  Tobacco Use  . Smoking status: Never Smoker  . Smokeless tobacco: Never Used  Vaping Use  . Vaping Use: Never used  Substance and Sexual Activity  . Alcohol use: No  . Drug use: No  . Sexual activity: Not on file  Other Topics Concern  . Not on file  Social History Narrative   [place]   Social Determinants of Health   Financial Resource Strain:   . Difficulty of Paying Living Expenses: Not on file  Food Insecurity:   . Worried About RCharity fundraiserin the Last Year: Not on file  . Ran Out of Food in the Last Year: Not on file  Transportation Needs:   . Lack of Transportation (Medical): Not on file  . Lack of Transportation (Non-Medical): Not on file  Physical Activity:   . Days of Exercise per Week: Not on file  . Minutes of Exercise per Session: Not on file  Stress:   . Feeling of Stress : Not on file  Social Connections:   . Frequency of Communication with Friends and Family: Not on file  . Frequency of Social Gatherings with Friends and Family: Not on file  . Attends Religious Services: Not on file  . Active Member of Clubs or Organizations: Not on file  . Attends CArchivistMeetings: Not on file  . Marital Status: Not on file   Family History  Problem Relation Age of Onset  . Myasthenia gravis Mother   . Hip fracture Father   . Heart disease Other        several uncles with CAD   Scheduled Meds: . apixaban  5 mg Oral BID  . levothyroxine  100 mcg Oral Q0600  . metoprolol tartrate  100 mg Oral BID  . mirabegron ER  50 mg Oral QPM  . simvastatin  20 mg Oral QHS  . sodium chloride flush  3 mL Intravenous Q12H  . sodium chloride flush  3 mL Intravenous Q12H  . traZODone  50 mg Oral QHS  . trimethoprim  100 mg Oral Daily   Continuous Infusions: . sodium chloride     PRN Meds:.sodium chloride, acetaminophen **OR** acetaminophen, ondansetron **OR** ondansetron (ZOFRAN) IV, polyethylene  glycol, sodium chloride flush Medications Prior to Admission:  Prior to Admission medications   Medication Sig Start Date End Date Taking? Authorizing Provider  acetaminophen (TYLENOL) 500 MG tablet Take 500 mg by mouth every 6 (six) hours as needed for moderate pain.   Yes [provider]  amLODipine (NORVASC) 10 MG tablet Take 10  mg by mouth daily.  08/19/13  Yes [provider]  aspirin 81 MG tablet Take 1 tablet (81 mg total) by mouth daily. Patient taking differently: Take 81 mg by mouth every evening.  12/03/10  Yes Bensimhon, Shaune Pascal, MD  hydrOXYzine (ATARAX/VISTARIL) 25 MG tablet Take 25 mg by mouth at bedtime as needed for anxiety.  02/03/20  Yes [provider]  isosorbide mononitrate (IMDUR) 30 MG 24 hr tablet Take 0.5 tablets (15 mg total) by mouth daily. 02/26/19 02/10/20 Yes Lelon Perla, MD  levothyroxine (SYNTHROID, LEVOTHROID) 100 MCG tablet Take 1 tablet by mouth daily. 12/13/15  Yes [provider]  metoprolol tartrate (LOPRESSOR) 50 MG tablet TAKE 1 TABLET BY MOUTH 2 TIMES DAILY. 04/22/19  Yes Lelon Perla, MD  Multiple Vitamin (MULTIVITAMIN) tablet Take 1 tablet by mouth every evening.    Yes [provider]  MYRBETRIQ 50 MG TB24 tablet Take 50 mg by mouth every evening.  10/31/19  Yes [provider]  nitroGLYCERIN (NITROSTAT) 0.4 MG SL tablet Place 0.4 mg under the tongue every 5 (five) minutes as needed. For chest pain   Yes [provider]  simvastatin (ZOCOR) 20 MG tablet Take 20 mg by mouth at bedtime.   Yes [provider]  traZODone (DESYREL) 50 MG tablet Take 1 tablet by mouth at bedtime.  12/15/15  Yes [provider]  trimethoprim (TRIMPEX) 100 MG tablet Take 100 mg by mouth daily. 01/21/20  Yes [provider]  vitamin B-12 (CYANOCOBALAMIN) 500 MCG tablet Take 500 mcg by mouth daily.   Yes [provider]  cephALEXin (KEFLEX) 500 MG capsule Take 1 capsule (500 mg  total) by mouth 2 (two) times daily for 7 days. 02/10/20 02/17/20  Noe Gens, PA-C  enalapril (VASOTEC) 10 MG tablet Take 1 tablet (10 mg total) by mouth daily. Patient not taking: Reported on 02/10/2020 07/17/13   Leonard Schwartz, MD   No Known Allergies Review of Systems  Poor historian unable to obtain.  Physical Exam General: Alert, awake, in no acute distress. Mild confusion.  HEENT: No bruits, no goiter, no JVD Heart: Regular rate and rhythm. No murmur appreciated. Lungs: Fair air movement Abdomen: Soft, nontender, nondistended, positive bowel sounds.  Ext: No significant edema Skin: Warm and dry Neuro: Grossly intact, nonfocal.  Vital Signs: BP 113/84 (BP Location: Left Arm)   Pulse 88   Temp 97.7 F (36.5 C) (Oral)   Resp (!) 24   Ht 5' 5"  (1.651 m)   Wt 57.4 kg   SpO2 94%   BMI 21.06 kg/m  Pain Scale: 0-10   Pain Score: 0-No pain   SpO2: SpO2: 94 % O2 Device:SpO2: 94 % O2 Flow Rate: .O2 Flow Rate (L/min): 4 L/min  IO: Intake/output summary:   Intake/Output Summary (Last 24 hours) at 02/14/2020 2115 Last data filed at 02/14/2020 1900 Gross per 24 hour  Intake 460 ml  Output 1215 ml  Net -755 ml    LBM: Last BM Date: 02/12/20 Baseline Weight: Weight: 55.3 kg Most recent weight: Weight: 57.4 kg     Palliative Assessment/Data:   Flowsheet Rows     Most Recent Value  Intake Tab  Referral Department Hospitalist  Unit at Time of Referral Med/Surg Unit  Palliative Care Primary Diagnosis Cardiac  Date Notified 02/12/20  Palliative Care Type New Palliative care  Reason for referral Clarify Goals of Care  Date of Admission 02/10/20  Date first seen by Palliative Care 02/14/20  #  of days Palliative referral response time 2 Day(s)  # of days IP prior to Palliative referral 2  Clinical Assessment  Palliative Performance Scale Score 40%  Psychosocial & Spiritual Assessment  Palliative Care Outcomes  Patient/Family meeting held? Yes  Who was at the  meeting? Son/HCPOA      Time In: 0930 Time Out: 1030 Time Total: 60 Greater than 50%  of this time was spent counseling and coordinating care related to the above assessment and plan.  Signed by: Micheline Rough, MD   Please contact Palliative Medicine Team phone at [phone] for questions and concerns.  For individual provider: See Shea Evans

## 2020-02-15 LAB — CBC WITH DIFFERENTIAL/PLATELET
Abs Immature Granulocytes: 0.03 10*3/uL (ref 0.00–0.07)
Basophils Absolute: 0 10*3/uL (ref 0.0–0.1)
Basophils Relative: 0 %
Eosinophils Absolute: 0.2 10*3/uL (ref 0.0–0.5)
Eosinophils Relative: 2 %
HCT: 35.6 % — ABNORMAL LOW (ref 36.0–46.0)
Hemoglobin: 10.9 g/dL — ABNORMAL LOW (ref 12.0–15.0)
Immature Granulocytes: 0 %
Lymphocytes Relative: 17 %
Lymphs Abs: 1.4 10*3/uL (ref 0.7–4.0)
MCH: 32.2 pg (ref 26.0–34.0)
MCHC: 30.6 g/dL (ref 30.0–36.0)
MCV: 105.3 fL — ABNORMAL HIGH (ref 80.0–100.0)
Monocytes Absolute: 0.9 10*3/uL (ref 0.1–1.0)
Monocytes Relative: 11 %
Neutro Abs: 5.4 10*3/uL (ref 1.7–7.7)
Neutrophils Relative %: 70 %
Platelets: 213 10*3/uL (ref 150–400)
RBC: 3.38 MIL/uL — ABNORMAL LOW (ref 3.87–5.11)
RDW: 12.1 % (ref 11.5–15.5)
WBC: 7.9 10*3/uL (ref 4.0–10.5)
nRBC: 0 % (ref 0.0–0.2)

## 2020-02-15 LAB — BASIC METABOLIC PANEL
Anion gap: 10 (ref 5–15)
BUN: 20 mg/dL (ref 8–23)
CO2: 31 mmol/L (ref 22–32)
Calcium: 8.8 mg/dL — ABNORMAL LOW (ref 8.9–10.3)
Chloride: 96 mmol/L — ABNORMAL LOW (ref 98–111)
Creatinine, Ser: 0.84 mg/dL (ref 0.44–1.00)
GFR, Estimated: >60 mL/min (ref >60)
Glucose, Bld: 101 mg/dL — ABNORMAL HIGH (ref 70–99)
Potassium: 4.1 mmol/L (ref 3.5–5.1)
Sodium: 137 mmol/L (ref 135–145)

## 2020-02-15 LAB — MAGNESIUM: Magnesium: 2 mg/dL (ref 1.7–2.4)

## 2020-02-15 MED ORDER — ATORVASTATIN CALCIUM 10 MG PO TABS
10.0000 mg | ORAL_TABLET | Freq: Every day | ORAL | Status: DC
  Administered 2020-02-15 – 2020-02-18 (×4): 10 mg via ORAL
  Filled 2020-02-15 (×4): qty 1

## 2020-02-15 MED ORDER — DILTIAZEM HCL 30 MG PO TABS
30.0000 mg | ORAL_TABLET | Freq: Two times a day (BID) | ORAL | Status: DC
  Administered 2020-02-15 – 2020-02-18 (×7): 30 mg via ORAL
  Filled 2020-02-15 (×7): qty 1

## 2020-02-15 NOTE — Plan of Care (Signed)
  Problem: Clinical Measurements: Goal: Respiratory complications will improve Outcome: Progressing   Problem: Activity: Goal: Risk for activity intolerance will decrease Outcome: Progressing   Problem: Coping: Goal: Level of anxiety will decrease Outcome: Progressing   Problem: Elimination: Goal: Will not experience complications related to urinary retention Outcome: Progressing   Problem: Safety: Goal: Ability to remain free from injury will improve Outcome: Progressing   Problem: Skin Integrity: Goal: Risk for impaired skin integrity will decrease Outcome: Progressing

## 2020-02-15 NOTE — Plan of Care (Signed)

## 2020-02-15 NOTE — Progress Notes (Signed)
Patient ID: [name], female   DOB: 06-20-1920, 86 y.o.   MRN: [id number]  PROGRESS NOTE    [name]  VPX:[id number] DOB: 05-15-1920 DOA: 02/10/2020 PCP: Holland Commons, FNP   Brief Narrative:  86 year old female with history of hypertension, CAD, chronic diastolic CHF, hypothyroidism and recurrent UTI presented with shortness of breath, aches and malaise.  On presentation, she was saturating in the mid to upper 80s on room air, tachycardic in the 110s, blood pressure was stable.  EKG showed atrial fibrillation.  CT of the head was negative for any acute intracranial abnormality.  CT of the abdomen and pelvis showed no acute findings.  CTA chest showed acute pulmonary embolism and right upper lobe nodule.  COVID-19 test was negative.  She was started on IV heparin.  She was subsequently switched to oral Eliquis on 02/12/2020.  Assessment & Plan:   Acute PE Acute hypoxic respiratory failure -CTA of the chest showed acute pulmonary embolism.  Initially started on IV heparin but was switched to oral Eliquis on 02/12/2020. -Lower extremity duplex did not show any evidence of lower extremity DVT.  Echo showed EF of 60 to 65% with moderate MR -Currently still on 4 L oxygen by nasal cannula.  Wean off as able.  Might need supplemental oxygen on discharge.  Acute on chronic diastolic CHF -Continue metoprolol.  Strict input and output.  Daily weights. -Cardiology following.  Continue metoprolol.  Received IV Lasix on 02/13/2020 and 02/14/2020.  Paroxysmal A. fib with RVR -Likely precipitated by PE.  Still tachycardic.  Continue metoprolol 100 mg daily.  Cardiology following and adding cardizem.  Anticoagulation plan as above.  Essential hypertension -Blood pressure on the lower side.  Continue metoprolol.  Hypothyroidism -Continue Synthroid  CKD IIIa -creatinine stable  Lung nodule -Incidentally noted on CT on presentation.  Outpatient follow-up  Generalized deconditioning -PT  recommends SNF placement.  Social worker following.  Palliative care following.  Patient will need outpatient palliative care follow-up.  If condition were to deteriorate, consider hospice/comfort measures  DVT prophylaxis:  Eliquis Code Status: DNR Family Communication: None at bedside Disposition Plan: Status is: Inpatient  Remains inpatient appropriate because:Inpatient level of care appropriate due to severity of illness   Dispo: The patient is from: Home              Anticipated d/c is to: SNF              Anticipated d/c date is: 1 day              Patient currently is not medically stable to d/c.  Consultants: None  Procedures: Echo/lower extremity duplex  Antimicrobials: None   Subjective: Patient seen and examined at bedside.  Poor historian. Awake, doesn't participate in  conversation much.  No overnight fever or vomiting reported. Objective: Vitals:   02/14/20 1111 02/14/20 1700 02/14/20 2232 02/15/20 0500  BP: 113/84  109/66   Pulse: 88  (!) 119   Resp: 20 (!) 24 20   Temp: 97.7 F (36.5 C)  98.6 F (37 C)   TempSrc: Oral  Oral   SpO2: 94%  94%   Weight:    59 kg  Height:        Intake/Output Summary (Last 24 hours) at 02/15/2020 0744 Last data filed at 02/15/2020 0600 Gross per 24 hour  Intake 460 ml  Output 1165 ml  Net -705 ml   Filed Weights   02/13/20 0632 02/14/20 0351 02/15/20 0500  Weight: 58.1 kg 57.4 kg 59 kg    Examination:  General exam: No distress.  Elderly female lying in bed.  Still on 4 L oxygen via nasal cannula.  Poor historian.  Awake, doesn't participate in  conversation much. Slightly confused. Respiratory system: Bilateral decreased breath sounds at bases with some crackles.  Intermittently tachypneic  cardiovascular system: Still tachycardic.  S1-S2 heard Gastrointestinal system: Abdomen is nondistended, soft and nontender.  Normal bowel sounds are heard  extremities: Mild lower extremity edema.  No clubbing  Data Reviewed:  I have personally reviewed following labs and imaging studies  CBC: Recent Labs  Lab 02/10/20 1836 02/11/20 0723 02/12/20 0519 02/13/20 0517 02/15/20 0657  WBC 10.5 8.5 8.2 8.4 7.9  NEUTROABS 6.9  --   --  5.9 5.4  HGB 11.7* 11.3* 10.3* 11.0* 10.9*  HCT 36.9 35.8* 33.5* 35.8* 35.6*  MCV 103.4* 103.8* 105.7* 105.9* 105.3*  PLT 182 165 153 167 280   Basic Metabolic Panel: Recent Labs  Lab 02/10/20 1836 02/11/20 0723 02/12/20 0519 02/13/20 0517 02/14/20 0517  NA 141 138 139 138  --   K 4.2 4.4 4.5 5.0  --   CL 103 105 107 102  --   CO2 26 25 25 27   --   GLUCOSE 120* 100* 119* 123*  --   BUN 18 16 14 17   --   CREATININE 0.97 0.90 0.91 0.94  --   CALCIUM 9.3 8.6* 8.4* 8.8*  --   MG  --  2.4 2.2 2.4 2.0  PHOS  --   --  3.3  --   --    GFR: Estimated Creatinine Clearance: 30.8 mL/min (by C-G formula based on SCr of 0.94 mg/dL). Liver Function Tests: Recent Labs  Lab 02/10/20 1836  AST 19  ALT 13  ALKPHOS 64  BILITOT 1.0  PROT 7.7  ALBUMIN 3.9   No results for input(s): LIPASE, AMYLASE in the last 168 hours. No results for input(s): AMMONIA in the last 168 hours. Coagulation Profile: Recent Labs  Lab 02/10/20 2318  INR 1.1   Cardiac Enzymes: No results for input(s): CKTOTAL, CKMB, CKMBINDEX, TROPONINI in the last 168 hours. BNP (last 3 results) No results for input(s): PROBNP in the last 8760 hours. HbA1C: No results for input(s): HGBA1C in the last 72 hours. CBG: No results for input(s): GLUCAP in the last 168 hours. Lipid Profile: No results for input(s): CHOL, HDL, LDLCALC, TRIG, CHOLHDL, LDLDIRECT in the last 72 hours. Thyroid Function Tests: No results for input(s): TSH, T4TOTAL, FREET4, T3FREE, THYROIDAB in the last 72 hours. Anemia Panel: No results for input(s): VITAMINB12, FOLATE, FERRITIN, TIBC, IRON, RETICCTPCT in the last 72 hours. Sepsis Labs: No results for input(s): PROCALCITON, LATICACIDVEN in the last 168 hours.  Recent Results (from  the past 240 hour(s))  Urine Culture     Status: None   Collection Time: 02/10/20  6:00 PM   Specimen: Urine, Random  Result Value Ref Range Status   Specimen Description   Final    URINE, RANDOM Performed at [facility] [address], [place], [place] [address]    Special Requests   Final    NONE Performed at [facility], [place] [address], [place], [place] [address]    Culture   Final    NO GROWTH Performed at [facility], [place] [address] [place], [place] [address]    Report Status 02/11/2020 FINAL  Final  Respiratory Panel by RT  PCR (Flu A&B, Covid) - Nasopharyngeal Swab     Status: None   Collection Time: 02/10/20  6:00 PM   Specimen: Nasopharyngeal Swab  Result Value Ref Range Status   SARS Coronavirus 2 by RT PCR NEGATIVE NEGATIVE Final    Comment: (NOTE) SARS-CoV-2 target nucleic acids are NOT DETECTED.  The SARS-CoV-2 RNA is generally detectable in upper respiratoy specimens during the acute phase of infection. The lowest concentration of SARS-CoV-2 viral copies this assay can detect is 131 copies/mL. A negative result does not preclude SARS-Cov-2 infection and should not be used as the sole basis for treatment or other patient management decisions. A negative result may occur with  improper specimen collection/handling, submission of specimen other than nasopharyngeal swab, presence of viral mutation(s) within the areas targeted by this assay, and inadequate number of viral copies (<131 copies/mL). A negative result must be combined with clinical observations, patient history, and epidemiological information. The expected result is Negative.  Fact Sheet for Patients:  PinkCheek.be  Fact Sheet for Healthcare Providers:  GravelBags.it  This test is no t yet approved or cleared by the [place] FDA and  has been authorized for detection and/or diagnosis of  SARS-CoV-2 by FDA under an Emergency Use Authorization (EUA). This EUA will remain  in effect (meaning this test can be used) for the duration of the COVID-19 declaration under Section 564(b)(1) of the Act, 21 U.S.C. section 360bbb-3(b)(1), unless the authorization is terminated or revoked sooner.     Influenza A by PCR NEGATIVE NEGATIVE Final   Influenza B by PCR NEGATIVE NEGATIVE Final    Comment: (NOTE) The Xpert Xpress SARS-CoV-2/FLU/RSV assay is intended as an aid in  the diagnosis of influenza from Nasopharyngeal swab specimens and  should not be used as a sole basis for treatment. Nasal washings and  aspirates are unacceptable for Xpert Xpress SARS-CoV-2/FLU/RSV  testing.  Fact Sheet for Patients: PinkCheek.be  Fact Sheet for Healthcare Providers: GravelBags.it  This test is not yet approved or cleared by the [place] FDA and  has been authorized for detection and/or diagnosis of SARS-CoV-2 by  FDA under an Emergency Use Authorization (EUA). This EUA will remain  in effect (meaning this test can be used) for the duration of the  Covid-19 declaration under Section 564(b)(1) of the Act, 21  U.S.C. section 360bbb-3(b)(1), unless the authorization is  terminated or revoked. Performed at [facility], [place] [address], [place], [place] [address]          Radiology Studies: DG CHEST PORT 1 VIEW  Result Date: 02/14/2020 CLINICAL DATA:  Shortness of breath EXAM: PORTABLE CHEST 1 VIEW COMPARISON:  03/21/2016 in CT of the chest of 02/10/2020 FINDINGS: Image rotated to the RIGHT. Accounting for this there is mild cardiac enlargement. Increasing density along the LEFT heart border with profile that suggests potential asymmetric soft tissue, perhaps related to breast tissue though with blunting of both RIGHT and LEFT costodiaphragmatic sulcus and graded opacity in the RIGHT and LEFT chest. Obscured LEFT  hemidiaphragm with partially obscured RIGHT hemidiaphragm on today's study. No acute skeletal process on limited assessment with signs of prior LEFT humeral fracture partially imaged. IMPRESSION: 1. Cardiomegaly with bilateral pleural effusions and basilar opacities likely atelectasis or infiltrate. 2. Increased density along the LEFT heart border with profile that may represent asymmetric soft tissue related to patient rotation and positioning. Worsening effusion or space process is also considered, attention on follow-up. Electronically Signed   By: Zetta Bills  M.D.   On: 02/14/2020 08:13        Scheduled Meds: . apixaban  5 mg Oral BID  . levothyroxine  100 mcg Oral Q0600  . metoprolol tartrate  100 mg Oral BID  . mirabegron ER  50 mg Oral QPM  . simvastatin  20 mg Oral QHS  . sodium chloride flush  3 mL Intravenous Q12H  . sodium chloride flush  3 mL Intravenous Q12H  . traZODone  50 mg Oral QHS  . trimethoprim  100 mg Oral Daily   Continuous Infusions: . sodium chloride            Aline August, MD [facility] 02/15/2020, 7:44 AM

## 2020-02-15 NOTE — Progress Notes (Signed)
Dr Newman Nickels consult note reviewed, patient being managed for new onset afib in setting of PE.Telemetry reviewed, afib 100s to 120s. She is on lopressor 100mg  bid, rates 100-120s. Rates should improve as she recovers from PE. In short term add diltiazem 30mg  bid with hold parameters, continue elqiuis   Carlyle Dolly MD

## 2020-02-16 NOTE — Progress Notes (Signed)
Telemetry reviewed, afib rates low 100s. She is on lopressor 100mg  bid, diltiazem 30mg  bid started yesterday. BP's are tolerating current regimen. Given PE and some ongoing hypoxia rates in the low 100s are probably physiologic for her, rates should improve as her acute systemic illness resolves. Continue current regimen at this time.    Carlyle Dolly MD

## 2020-02-16 NOTE — Plan of Care (Signed)

## 2020-02-16 NOTE — Plan of Care (Signed)
  Problem: Education: Goal: Knowledge of General Education information will improve Description: Including pain rating scale, medication(s)/side effects and non-pharmacologic comfort measures Outcome: Progressing   Problem: Activity: Goal: Risk for activity intolerance will decrease Outcome: Progressing   Problem: Pain Managment: Goal: General experience of comfort will improve Outcome: Progressing   

## 2020-02-16 NOTE — Progress Notes (Signed)
Patient ID: [name], female   DOB: 05-15-1920, 86 y.o.   MRN: [id number]  PROGRESS NOTE    [name]  WYO:[id number] DOB: 1920-05-07 DOA: 02/10/2020 PCP: Holland Commons, FNP   Brief Narrative:  86 year old female with history of hypertension, CAD, chronic diastolic CHF, hypothyroidism and recurrent UTI presented with shortness of breath, aches and malaise.  On presentation, she was saturating in the mid to upper 80s on room air, tachycardic in the 110s, blood pressure was stable.  EKG showed atrial fibrillation.  CT of the head was negative for any acute intracranial abnormality.  CT of the abdomen and pelvis showed no acute findings.  CTA chest showed acute pulmonary embolism and right upper lobe nodule.  COVID-19 test was negative.  She was started on IV heparin.  She was subsequently switched to oral Eliquis on 02/12/2020.  Assessment & Plan:   Acute PE Acute hypoxic respiratory failure -CTA of the chest showed acute pulmonary embolism.  Initially started on IV heparin but was switched to oral Eliquis on 02/12/2020. -Lower extremity duplex did not show any evidence of lower extremity DVT.  Echo showed EF of 60 to 65% with moderate MR -Currently on 2 to 3 L oxygen by nasal cannula.  Wean off as able.  Might need supplemental oxygen on discharge.  Acute on chronic diastolic CHF -Continue metoprolol.  Strict input and output.  Daily weights. -Cardiology following.  Continue metoprolol.  Received IV Lasix on 02/13/2020 and 02/14/2020.  Paroxysmal A. fib with RVR -Likely precipitated by PE.  Still tachycardic.  Continue metoprolol and oral Cardizem.  Cardiology following.  Anticoagulation plan as above.  Essential hypertension -Blood pressure stable.  Continue metoprolol and Cardizem.  Hypothyroidism -Continue Synthroid  CKD IIIa -creatinine stable  Lung nodule -Incidentally noted on CT on presentation.  Outpatient follow-up  Generalized deconditioning -PT recommends SNF  placement.  Social worker following.  Palliative care following.  Patient will need outpatient palliative care follow-up.  If condition were to deteriorate, consider hospice/comfort measures  DVT prophylaxis:  Eliquis Code Status: DNR Family Communication: None at bedside Disposition Plan: Status is: Inpatient  Remains inpatient appropriate because:Inpatient level of care appropriate due to severity of illness   Dispo: The patient is from: Home              Anticipated d/c is to: SNF              Anticipated d/c date is: 1 day              Patient currently is medically stable to d/c.  Consultants: Cardiology  Procedures: Echo/lower extremity duplex  Antimicrobials: None   Subjective: Patient seen and examined at bedside sitting on chair.  Extremely poor historian. Awake, hardly participates in any conversation.  No fever, chest pain, worsening shortness of breath reported. Objective: Vitals:   02/15/20 1544 02/15/20 2113 02/16/20 0500 02/16/20 0608  BP:  100/70  127/82  Pulse:  96  99  Resp:  20  20  Temp:  98.2 F (36.8 C)  (!) 97.3 F (36.3 C)  TempSrc:  Oral  Oral  SpO2: 94% 92%  92%  Weight:   55.7 kg   Height:        Intake/Output Summary (Last 24 hours) at 02/16/2020 0742 Last data filed at 02/16/2020 0500 Gross per 24 hour  Intake 603 ml  Output 642 ml  Net -39 ml   Filed Weights   02/14/20 0351 02/15/20 0500 02/16/20  0500  Weight: 57.4 kg 59 kg 55.7 kg    Examination:  General exam: No acute distress.  Elderly female lying in bed.  Still on 3L oxygen via nasal cannula.  Poor historian. Awake, hardly participates in any conversation Respiratory system: Bilateral decreased breath sounds at bases with scattered rales, no wheezing  cardiovascular system: S1-S2 heard, tachycardic  gastrointestinal system: Abdomen is nondistended, soft and nontender.  Bowel sounds heard  extremities: No cyanosis.  Trace lower extremity edema present.  Data Reviewed: I have  personally reviewed following labs and imaging studies  CBC: Recent Labs  Lab 02/10/20 1836 02/11/20 0723 02/12/20 0519 02/13/20 0517 02/15/20 0657  WBC 10.5 8.5 8.2 8.4 7.9  NEUTROABS 6.9  --   --  5.9 5.4  HGB 11.7* 11.3* 10.3* 11.0* 10.9*  HCT 36.9 35.8* 33.5* 35.8* 35.6*  MCV 103.4* 103.8* 105.7* 105.9* 105.3*  PLT 182 165 153 167 062   Basic Metabolic Panel: Recent Labs  Lab 02/10/20 1836 02/11/20 0723 02/12/20 0519 02/13/20 0517 02/14/20 0517 02/15/20 0657  NA 141 138 139 138  --  137  K 4.2 4.4 4.5 5.0  --  4.1  CL 103 105 107 102  --  96*  CO2 26 25 25 27   --  31  GLUCOSE 120* 100* 119* 123*  --  101*  BUN 18 16 14 17   --  20  CREATININE 0.97 0.90 0.91 0.94  --  0.84  CALCIUM 9.3 8.6* 8.4* 8.8*  --  8.8*  MG  --  2.4 2.2 2.4 2.0 2.0  PHOS  --   --  3.3  --   --   --    GFR: Estimated Creatinine Clearance: 33.7 mL/min (by C-G formula based on SCr of 0.84 mg/dL). Liver Function Tests: Recent Labs  Lab 02/10/20 1836  AST 19  ALT 13  ALKPHOS 64  BILITOT 1.0  PROT 7.7  ALBUMIN 3.9   No results for input(s): LIPASE, AMYLASE in the last 168 hours. No results for input(s): AMMONIA in the last 168 hours. Coagulation Profile: Recent Labs  Lab 02/10/20 2318  INR 1.1   Cardiac Enzymes: No results for input(s): CKTOTAL, CKMB, CKMBINDEX, TROPONINI in the last 168 hours. BNP (last 3 results) No results for input(s): PROBNP in the last 8760 hours. HbA1C: No results for input(s): HGBA1C in the last 72 hours. CBG: No results for input(s): GLUCAP in the last 168 hours. Lipid Profile: No results for input(s): CHOL, HDL, LDLCALC, TRIG, CHOLHDL, LDLDIRECT in the last 72 hours. Thyroid Function Tests: No results for input(s): TSH, T4TOTAL, FREET4, T3FREE, THYROIDAB in the last 72 hours. Anemia Panel: No results for input(s): VITAMINB12, FOLATE, FERRITIN, TIBC, IRON, RETICCTPCT in the last 72 hours. Sepsis Labs: No results for input(s): PROCALCITON,  LATICACIDVEN in the last 168 hours.  Recent Results (from the past 240 hour(s))  Urine Culture     Status: None   Collection Time: 02/10/20  6:00 PM   Specimen: Urine, Random  Result Value Ref Range Status   Specimen Description   Final    URINE, RANDOM Performed at [facility] [address] [place], [place] [address]    Special Requests   Final    NONE Performed at [facility], [place] [address], [place], [place] [address]    Culture   Final    NO GROWTH Performed at [facility], [place] [address], [place], [place] [address]    Report Status  02/11/2020 FINAL  Final  Respiratory Panel by RT PCR (Flu A&B, Covid) - Nasopharyngeal Swab     Status: None   Collection Time: 02/10/20  6:00 PM   Specimen: Nasopharyngeal Swab  Result Value Ref Range Status   SARS Coronavirus 2 by RT PCR NEGATIVE NEGATIVE Final    Comment: (NOTE) SARS-CoV-2 target nucleic acids are NOT DETECTED.  The SARS-CoV-2 RNA is generally detectable in upper respiratoy specimens during the acute phase of infection. The lowest concentration of SARS-CoV-2 viral copies this assay can detect is 131 copies/mL. A negative result does not preclude SARS-Cov-2 infection and should not be used as the sole basis for treatment or other patient management decisions. A negative result may occur with  improper specimen collection/handling, submission of specimen other than nasopharyngeal swab, presence of viral mutation(s) within the areas targeted by this assay, and inadequate number of viral copies (<131 copies/mL). A negative result must be combined with clinical observations, patient history, and epidemiological information. The expected result is Negative.  Fact Sheet for Patients:  PinkCheek.be  Fact Sheet for Healthcare Providers:  GravelBags.it  This test is no t yet approved or cleared by the [place] FDA and   has been authorized for detection and/or diagnosis of SARS-CoV-2 by FDA under an Emergency Use Authorization (EUA). This EUA will remain  in effect (meaning this test can be used) for the duration of the COVID-19 declaration under Section 564(b)(1) of the Act, 21 U.S.C. section 360bbb-3(b)(1), unless the authorization is terminated or revoked sooner.     Influenza A by PCR NEGATIVE NEGATIVE Final   Influenza B by PCR NEGATIVE NEGATIVE Final    Comment: (NOTE) The Xpert Xpress SARS-CoV-2/FLU/RSV assay is intended as an aid in  the diagnosis of influenza from Nasopharyngeal swab specimens and  should not be used as a sole basis for treatment. Nasal washings and  aspirates are unacceptable for Xpert Xpress SARS-CoV-2/FLU/RSV  testing.  Fact Sheet for Patients: PinkCheek.be  Fact Sheet for Healthcare Providers: GravelBags.it  This test is not yet approved or cleared by the [place] FDA and  has been authorized for detection and/or diagnosis of SARS-CoV-2 by  FDA under an Emergency Use Authorization (EUA). This EUA will remain  in effect (meaning this test can be used) for the duration of the  Covid-19 declaration under Section 564(b)(1) of the Act, 21  U.S.C. section 360bbb-3(b)(1), unless the authorization is  terminated or revoked. Performed at [facility], [place] [address] [place], [place] [address]          Radiology Studies: DG CHEST PORT 1 VIEW  Result Date: 02/14/2020 CLINICAL DATA:  Shortness of breath EXAM: PORTABLE CHEST 1 VIEW COMPARISON:  03/21/2016 in CT of the chest of 02/10/2020 FINDINGS: Image rotated to the RIGHT. Accounting for this there is mild cardiac enlargement. Increasing density along the LEFT heart border with profile that suggests potential asymmetric soft tissue, perhaps related to breast tissue though with blunting of both RIGHT and LEFT costodiaphragmatic sulcus and  graded opacity in the RIGHT and LEFT chest. Obscured LEFT hemidiaphragm with partially obscured RIGHT hemidiaphragm on today's study. No acute skeletal process on limited assessment with signs of prior LEFT humeral fracture partially imaged. IMPRESSION: 1. Cardiomegaly with bilateral pleural effusions and basilar opacities likely atelectasis or infiltrate. 2. Increased density along the LEFT heart border with profile that may represent asymmetric soft tissue related to patient rotation and positioning. Worsening effusion or space process is also considered, attention on  follow-up. Electronically Signed   By: Zetta Bills M.D.   On: 02/14/2020 08:13        Scheduled Meds: . apixaban  5 mg Oral BID  . atorvastatin  10 mg Oral Daily  . diltiazem  30 mg Oral BID  . levothyroxine  100 mcg Oral Q0600  . metoprolol tartrate  100 mg Oral BID  . mirabegron ER  50 mg Oral QPM  . sodium chloride flush  3 mL Intravenous Q12H  . sodium chloride flush  3 mL Intravenous Q12H  . traZODone  50 mg Oral QHS  . trimethoprim  100 mg Oral Daily   Continuous Infusions: . sodium chloride            Aline August, MD [facility] 02/16/2020, 7:42 AM

## 2020-02-17 DIAGNOSIS — I2694 Multiple subsegmental pulmonary emboli without acute cor pulmonale: Secondary | ICD-10-CM

## 2020-02-17 LAB — SARS CORONAVIRUS 2 BY RT PCR (HOSPITAL ORDER, PERFORMED IN ~~LOC~~ HOSPITAL LAB): SARS Coronavirus 2: NEGATIVE

## 2020-02-17 MED ORDER — MELATONIN 5 MG PO TABS
5.0000 mg | ORAL_TABLET | Freq: Once | ORAL | Status: AC
  Administered 2020-02-17: 5 mg via ORAL
  Filled 2020-02-17: qty 1

## 2020-02-17 MED ORDER — MELATONIN 5 MG PO TABS
5.0000 mg | ORAL_TABLET | Freq: Every evening | ORAL | Status: DC | PRN
  Administered 2020-02-17: 5 mg via ORAL
  Filled 2020-02-17: qty 1

## 2020-02-17 NOTE — Care Management Important Message (Signed)
Important Message  Patient Details IM Letter given to the Patient Name: [name] MRN: [id number] Date of Birth: [birth year]   Medicare Important Message Given:  Yes     Kerin Salen 02/17/2020, 12:31 PM

## 2020-02-17 NOTE — Progress Notes (Signed)
Physical Therapy Treatment Patient Details Name: BRIT CARBONELL MRN: [id number] DOB: 05/21/1920 Today's Date: 02/17/2020    History of Present Illness 86 year old female with history of hypertension, CAD, chronic diastolic CHF, hypothyroidism and recurrent UTI presented with shortness of breath, aches and malaise.  On presentation, she was saturating in the mid to upper 80s on room air, tachycardic in the 110s, blood pressure was stable.  EKG showed atrial fibrillation.  CT of the head was negative for any acute intracranial abnormality.  CT of the abdomen and pelvis showed no acute findings.  CTA chest showed acute pulmonary embolism and right upper lobe nodule    PT Comments    Pt with improvement in functional strength this session. Pt cued for sequencing and use of bedrail to come to sitting at EOB requiring mod assist and increased time. Once seated, pt with minimal trunk swaying in all directions without LOB in any direction. Pt cued for hand placement and glute/quad activation to rise from sitting EOB, 2nd person assisting for pt comfort. Pt able to transfer to bedside chair with min A to steady and maneuver RW, limited to steps forward/back and lateral at bedside. Pt declines to remain up and chair due to discomfort so assisted back to supine, min A to lift BLE back into bed. Pt on 3L O2 with SpO2 90s per pulse ox on dynamap and HR 110 bpm max per heart monitor; varying readings noted during session with poor waveform and RN notified. Pt with moderate SOB and cued for pursed lip breathing to improve with frequent reminders. Patient will benefit from continued physical therapy in hospital and recommendations below to increase strength, balance, endurance for safe ADLs and gait.   Follow Up Recommendations  SNF;Supervision/Assistance - 24 hour     Equipment Recommendations  None recommended by PT    Recommendations for Other Services       Precautions / Restrictions  Precautions Precautions: Fall Precaution Comments: monitor sats and HR Restrictions Weight Bearing Restrictions: No    Mobility  Bed Mobility Overal bed mobility: Needs Assistance Bed Mobility: Supine to Sit;Sit to Supine  Supine to sit: Mod assist Sit to supine: Min assist   General bed mobility comments: mod A to upright trunk, pt able to bring BLE off of bed, cues for use of bedrail and sequencing to assist in upright position; min A to lift BLE back onto bed when return to supine  Transfers Overall transfer level: Needs assistance Equipment used: Rolling walker (2 wheeled) Transfers: Sit to/from [facility] Sit to Stand: Min assist;+2 safety/equipment Stand pivot transfers: Min assist;+2 safety/equipment    General transfer comment: min A x2 to rise from seated position on bed and in chair and for pivoting in room, cues for hand placement when rising  Ambulation/Gait Ambulation/Gait assistance: Min assist;+2 safety/equipment  Assistive device: Rolling walker (2 wheeled) Gait Pattern/deviations: Step-to pattern;Shuffle;Trunk flexed  General Gait Details: limited to short, shuffling steps at bedside forward/back/lateral with min A to maneuver RW appropriately   Stairs             Wheelchair Mobility    Modified Rankin (Stroke Patients Only)       Balance Overall balance assessment: Needs assistance Sitting-balance support: Bilateral upper extremity supported;Feet supported Sitting balance-Leahy Scale: Fair Sitting balance - Comments: seated EOB with minimal trunk swaying in all directions without LOB   Standing balance support: Bilateral upper extremity supported;During functional activity Standing balance-Leahy Scale: Poor Standing balance comment: reliant on UEs  for support         Cognition Arousal/Alertness: Awake/alert Behavior During Therapy: WFL for tasks assessed/performed Overall Cognitive Status: Within Functional Limits for  tasks assessed             Exercises      General Comments General comments (skin integrity, edema, etc.): HR monitor max 110bpm, pt on 3L with SpO2 90s during treatment and moderate SOB noted, cues for pursed lip breathing with mobility      Pertinent Vitals/Pain Pain Assessment: Faces Faces Pain Scale: Hurts a little bit Pain Location: generalized Pain Descriptors / Indicators: Moaning Pain Intervention(s): Limited activity within patient's tolerance;Monitored during session;Repositioned    Home Living                      Prior Function            PT Goals (current goals can now be found in the care plan section) Acute Rehab PT Goals PT Goal Formulation: With patient/family Time For Goal Achievement: 02/27/20 Potential to Achieve Goals: Good Progress towards PT goals: Progressing toward goals    Frequency    Min 2X/week      PT Plan Current plan remains appropriate    Co-evaluation              AM-PAC PT "6 Clicks" Mobility   Outcome Measure  Help needed turning from your back to your side while in a flat bed without using bedrails?: A Lot Help needed moving from lying on your back to sitting on the side of a flat bed without using bedrails?: A Lot Help needed moving to and from a bed to a chair (including a wheelchair)?: A Little Help needed standing up from a chair using your arms (e.g., wheelchair or bedside chair)?: A Little Help needed to walk in hospital room?: A Little Help needed climbing 3-5 steps with a railing? : Total 6 Click Score: 14    End of Session Equipment Utilized During Treatment: Gait belt;Oxygen Activity Tolerance: Patient tolerated treatment well Patient left: with call bell/phone within reach;in bed;with bed alarm set Nurse Communication: Mobility status;Other (comment) (HR and SpO2) PT Visit Diagnosis: Other abnormalities of gait and mobility (R26.89);Muscle weakness (generalized) (M62.81)     Time:  8675-4492 PT Time Calculation (min) (ACUTE ONLY): 20 min  Charges:  $Therapeutic Activity: 8-22 mins                      Tori Dolphus Linch PT, DPT 02/17/20, 11:09 AM

## 2020-02-17 NOTE — TOC Progression Note (Addendum)
Transition of Care [facility]) - Progression Note    Patient Details  Name: [name] MRN: [id number] Date of Birth: 1920-10-17  Transition of Care [facility]) CM/SW Contact  Maalle Starrett, Juliann Pulse, RN Phone Number: 02/17/2020, 9:29 AM  Clinical Narrative: TC HTA rep-[facility]-Saturday the [facility] was withdrawn d/t not medically stable. Today patient is medically stable for d/c [facility] SNF-started expidited auth for SNF/PTAR-await auth. covid ordered.Informed [facility]-rep [place].Palliative Care Services-[facility] care following.   1:48p-TC HTA rep [facility]-Medical Dir is reviewing-awaiting auth.  Expected Discharge Plan: Skilled Nursing Facility Barriers to Discharge: Insurance Authorization  Expected Discharge Plan and Services Expected Discharge Plan: [place]   Discharge Planning Services: CM Consult Post Acute Care Choice: [place] arrangements for the past 2 months: Single Family Home                                       Social Determinants of Health (SDOH) Interventions    Readmission Risk Interventions No flowsheet data found.

## 2020-02-17 NOTE — Progress Notes (Signed)
Progress Note  Patient Name: [name] Date of Encounter: 02/17/2020  [facility] Cardiologist: Kirk Ruths, MD   Subjective   Sleeping but wakes with name call. No chest pain no SOB almost flat in bed  Inpatient Medications    Scheduled Meds: . apixaban  5 mg Oral BID  . atorvastatin  10 mg Oral Daily  . diltiazem  30 mg Oral BID  . levothyroxine  100 mcg Oral Q0600  . metoprolol tartrate  100 mg Oral BID  . mirabegron ER  50 mg Oral QPM  . sodium chloride flush  3 mL Intravenous Q12H  . sodium chloride flush  3 mL Intravenous Q12H  . traZODone  50 mg Oral QHS  . trimethoprim  100 mg Oral Daily   Continuous Infusions: . sodium chloride     PRN Meds: sodium chloride, acetaminophen **OR** acetaminophen, ondansetron **OR** ondansetron (ZOFRAN) IV, polyethylene glycol, sodium chloride flush   Vital Signs    Vitals:   02/16/20 1249 02/16/20 1942 02/17/20 0500 02/17/20 0827  BP: (!) 141/82 (!) 138/98  137/90  Pulse: 96 90  (!) 122  Resp: 18 18  20   Temp: 97.8 F (36.6 C) 97.7 F (36.5 C)  98 F (36.7 C)  TempSrc: Oral Oral  Oral  SpO2: 93% 92%  98%  Weight:   55.3 kg   Height:        Intake/Output Summary (Last 24 hours) at 02/17/2020 1152 Last data filed at 02/17/2020 0900 Gross per 24 hour  Intake 240 ml  Output 950 ml  Net -710 ml   Last 3 Weights 02/17/2020 02/16/2020 02/15/2020  Weight (lbs) 121 lb 14.6 oz 122 lb 12.7 oz 130 lb 1.1 oz  Weight (kg) 55.3 kg 55.7 kg 59 kg      Telemetry    Atrial fib  - Personally Reviewed  ECG    No new - Personally Reviewed  Physical Exam   GEN: No acute distress.   Neck: No JVD Cardiac: irrreg irreg, no murmurs, rubs, or gallops.  Respiratory: Clear to auscultation bilaterally. GI: Soft, nontender, non-distended  MS: No edema; No deformity. Neuro:  Nonfocal  Psych: Normal affect   Labs    High Sensitivity Troponin:   Recent Labs  Lab 02/10/20 1836  TROPONINIHS 8      Chemistry Recent  Labs  Lab 02/10/20 1836 02/11/20 0723 02/12/20 0519 02/13/20 0517 02/15/20 0657  NA 141   < > 139 138 137  K 4.2   < > 4.5 5.0 4.1  CL 103   < > 107 102 96*  CO2 26   < > 25 27 31   GLUCOSE 120*   < > 119* 123* 101*  BUN 18   < > 14 17 20   CREATININE 0.97   < > 0.91 0.94 0.84  CALCIUM 9.3   < > 8.4* 8.8* 8.8*  PROT 7.7  --   --   --   --   ALBUMIN 3.9  --   --   --   --   AST 19  --   --   --   --   ALT 13  --   --   --   --   ALKPHOS 64  --   --   --   --   BILITOT 1.0  --   --   --   --   GFRNONAA 53*   < > 57* 55* >60  ANIONGAP 12   < >  7 9 10    < > = values in this interval not displayed.     Hematology Recent Labs  Lab 02/12/20 0519 02/13/20 0517 02/15/20 0657  WBC 8.2 8.4 7.9  RBC 3.17* 3.38* 3.38*  HGB 10.3* 11.0* 10.9*  HCT 33.5* 35.8* 35.6*  MCV 105.7* 105.9* 105.3*  MCH 32.5 32.5 32.2  MCHC 30.7 30.7 30.6  RDW 12.6 12.4 12.1  PLT 153 167 213    BNP Recent Labs  Lab 02/10/20 1836  BNP 571.7*     DDimer No results for input(s): DDIMER in the last 168 hours.   Radiology    No results found.  Cardiac Studies   Echo 02/11/20  IMPRESSIONS    1. Left ventricular ejection fraction, by estimation, is 60 to 65%. The  left ventricle has normal function. The left ventricle has no regional  wall motion abnormalities. There is mild left ventricular hypertrophy.  Left ventricular diastolic function  could not be evaluated.  2. Right ventricular systolic function is normal. The right ventricular  size is normal. There is moderately elevated pulmonary artery systolic  pressure. The estimated right ventricular systolic pressure is 03.5 mmHg.  3. The mitral valve is abnormal. Moderate mitral valve regurgitation.  4. The aortic valve is tricuspid. Aortic valve regurgitation is not  visualized. Mild aortic valve sclerosis is present, with no evidence of  aortic valve stenosis.  5. The inferior vena cava is normal in size with <50% respiratory    variability, suggesting right atrial pressure of 8 mmHg.   Comparison(s): Prior images unable to be directly viewed, comparison made  by report only. 03/30/2016: LVEF 65-70%, grade 1 DD with high LV filling  pressure, trivial MR, RVSP 28 mmHg.   FINDINGS  Left Ventricle: Left ventricular ejection fraction, by estimation, is 60  to 65%. The left ventricle has normal function. The left ventricle has no  regional wall motion abnormalities. The left ventricular internal cavity  size was normal in size. There is  mild left ventricular hypertrophy. Left ventricular diastolic function  could not be evaluated due to atrial fibrillation. Left ventricular  diastolic function could not be evaluated.   Right Ventricle: The right ventricular size is normal. No increase in  right ventricular wall thickness. Right ventricular systolic function is  normal. There is moderately elevated pulmonary artery systolic pressure.  The tricuspid regurgitant velocity is  3.41 m/s, and with an assumed right atrial pressure of 8 mmHg, the  estimated right ventricular systolic pressure is 00.9 mmHg.   Left Atrium: Left atrial size was normal in size.   Right Atrium: Right atrial size was normal in size.   Pericardium: There is no evidence of pericardial effusion.   Mitral Valve: The mitral valve is abnormal. There is mild thickening of  the mitral valve leaflet(s). Mild to moderate mitral annular  calcification. Moderate mitral valve regurgitation, with  posteriorly-directed jet.   Tricuspid Valve: The tricuspid valve is grossly normal. Tricuspid valve  regurgitation is mild.   Aortic Valve: The aortic valve is tricuspid. Aortic valve regurgitation is  not visualized. Mild aortic valve sclerosis is present, with no evidence  of aortic valve stenosis.   Pulmonic Valve: The pulmonic valve was grossly normal. Pulmonic valve  regurgitation is trivial.   Aorta: The aortic root and ascending aorta are  structurally normal, with  no evidence of dilitation.   Venous: The inferior vena cava is normal in size with less than 50%  respiratory variability, suggesting right atrial pressure  of 8 mmHg.   IAS/Shunts: No atrial level shunt detected by color flow Doppler.     LEFT VENTRICLE  PLAX 2D  LVIDd:     3.30 cm Diastology  LVIDs:     2.20 cm LV e' medial:  6.20 cm/s  LV PW:     1.10 cm LV E/e' medial: 20.2  LV IVS:    1.10 cm LV e' lateral:  9.90 cm/s  LVOT diam:   1.90 cm LV E/e' lateral: 12.6  LV SV:     46  LV SV Index:  28  LVOT Area:   2.84 cm     RIGHT VENTRICLE  RV Basal diam: 2.90 cm  RV S prime:   10.60 cm/s  TAPSE (M-mode): 3.0 cm   LEFT ATRIUM       Index    RIGHT ATRIUM      Index  LA diam:    4.10 cm 2.50 cm/m RA Area:   14.90 cm  LA Vol (A2C):  42.6 ml 26.02 ml/m RA Volume:  32.70 ml 19.98 ml/m  LA Vol (A4C):  52.9 ml 32.32 ml/m  LA Biplane Vol: 47.8 ml 29.20 ml/m  AORTIC VALVE  LVOT Vmax:  80.20 cm/s  LVOT Vmean: 56.700 cm/s  LVOT VTI:  0.163 m    AORTA  Ao Root diam: 2.70 cm   Patient Profile     86 y.o. female with a PMH of CAD s/p PCI to LAD in 2009 and PCI/DES to RCA in 1610, chronic diastolic CHF, HTN, HLD, hypothyroidism, recurrent UTI, and now atrial fib in combination with acute PE with acute hypoxic respiratory failure.    Assessment & Plan    1. New onset atrial fibrillation: patient presented with SOB, found to have an acute RUL PE. Started on heparin and transitioned to eliquis 5mg  BID. BBlocker initially held on admit, restarted 11/3, and uptitrated to 100mg  BID today with HR's still in the 110s at times. Suspect this is driven by PE. Baseline rhythm is sinus rhythm with 1st degree AV block. Anticipate tachycardia will improve as she recovers from her PE.  - continue metoprolol 100mg  BID for rate control and dilt 30 mg BID added, EF is normal. - Currently on apixaban  5mg  BID given concomitant PE - would be reasonable to de-escalate to eliquis 2.5mg  BID for stroke ppx once management of PE is complete (dose adjusted for age and wt <60kg)  2. Acute PE: patient presented with SOB, found to have an acute RUL PE. Started on heparin and transitioned to eliquis 5mg  BID. She continues to have O2 via [place] demand.  - Continue management per primary team  3. CAD s/p PCI to LAD in 2009 and PCI/DES to RCA in 2011: no anginal complaints. Aspirin discontinued now that anticoagulation is warranted.  - Continue statin   4. Chronic diastolic CHF: appears well compensated.  - Continue BBlocker --is neg 973 since admit  Wt down from pk wt of 58.1 Kg to 55.3  Has rec'd lasix last dose 02/14/20   5. HTN: BP is stable. 137/90 Home amlodipine and enalapril on held this admission - Continue BBlocker and dilt , stop amlodipine for home - Can resume home medications as needed based on BP   6. Mod MR on echo  7.  Pt is DNR and followed by Palliative care at home  8.  HLD zocor changed to lipitor with dilt.       For questions or updates, please contact [facility] Please consult [email or web address] for contact info under        Signed, Cecilie Kicks, NP  02/17/2020, 11:52 AM

## 2020-02-17 NOTE — Progress Notes (Signed)
Patient ID: [name], female   DOB: Mar 14, 1921, 86 y.o.   MRN: [id number]  PROGRESS NOTE    HALO SHEVLIN  YQI:[id number] DOB: [birth year] DOA: 02/10/2020 PCP: Holland Commons, FNP   Brief Narrative:  86 year old female with history of hypertension, CAD, chronic diastolic CHF, hypothyroidism and recurrent UTI presented with shortness of breath, aches and malaise.  On presentation, she was saturating in the mid to upper 80s on room air, tachycardic in the 110s, blood pressure was stable.  EKG showed atrial fibrillation.  CT of the head was negative for any acute intracranial abnormality.  CT of the abdomen and pelvis showed no acute findings.  CTA chest showed acute pulmonary embolism and right upper lobe nodule.  COVID-19 test was negative.  She was started on IV heparin.  She was subsequently switched to oral Eliquis on 02/12/2020.  Cardiology was consulted for A. fib with RVR.  Assessment & Plan:   Acute PE Acute hypoxic respiratory failure -CTA of the chest showed acute pulmonary embolism.  Initially started on IV heparin but was switched to oral Eliquis on 02/12/2020. -Lower extremity duplex did not show any evidence of lower extremity DVT.  Echo showed EF of 60 to 65% with moderate MR -Currently still on 2 to 3 L oxygen by nasal cannula.  Wean off as able.  Might need supplemental oxygen on discharge.  Acute on chronic diastolic CHF -Continue metoprolol.  Strict input and output.  Daily weights. -Cardiology following.  Continue metoprolol.  Received IV Lasix on 02/13/2020 and 02/14/2020.  Paroxysmal A. fib with RVR -Likely precipitated by PE.  Still tachycardic.  Continue metoprolol and oral Cardizem.  Cardiology following.  Anticoagulation plan as above.  Essential hypertension -Blood pressure stable.  Continue metoprolol and Cardizem.  Hypothyroidism -Continue Synthroid  CKD IIIa -creatinine stable  Lung nodule -Incidentally noted on CT on presentation.  Outpatient  follow-up  Generalized deconditioning -PT recommends SNF placement.  Social worker following.  Palliative care following.  Patient will need outpatient palliative care follow-up.  If condition were to deteriorate, consider hospice/comfort measures  DVT prophylaxis:  Eliquis Code Status: DNR Family Communication: None at bedside Disposition Plan: Status is: Inpatient  Remains inpatient appropriate because:Inpatient level of care appropriate due to severity of illness   Dispo: The patient is from: Home              Anticipated d/c is to: SNF              Anticipated d/c date is: 1 day              Patient currently is medically stable to d/c.  Consultants: Cardiology  Procedures: Echo/lower extremity duplex  Antimicrobials: None   Subjective: Patient seen and examined at bedside.  Very poor historian. Awake, does not approximately conversation much.  No overnight fever, vomiting or chest pain reported.  Doesn't feel well. Objective: Vitals:   02/16/20 1014 02/16/20 1249 02/16/20 1942 02/17/20 0500  BP: 139/82 (!) 141/82 (!) 138/98   Pulse: (!) 109 96 90   Resp:  18 18   Temp:  97.8 F (36.6 C) 97.7 F (36.5 C)   TempSrc:  Oral Oral   SpO2:  93% 92%   Weight:    55.3 kg  Height:        Intake/Output Summary (Last 24 hours) at 02/17/2020 0723 Last data filed at 02/16/2020 2040 Gross per 24 hour  Intake 243 ml  Output 451 ml  Net -208  ml   Filed Weights   02/15/20 0500 02/16/20 0500 02/17/20 0500  Weight: 59 kg 55.7 kg 55.3 kg    Examination:  General exam: No distress.  Elderly female lying in bed.  Still on 3L oxygen via nasal cannula.  Very poor historian.  Awake, does not approximately conversation much Respiratory system: Bilateral decreased breath sounds at bases with some crackles  cardiovascular system: Still tachycardic, S1-S2 heard  gastrointestinal system: Abdomen is nondistended, soft and nontender.  Normal bowel sounds are heard  extremities: Mild  lower extremity edema present.  No cyanosis  Data Reviewed: I have personally reviewed following labs and imaging studies  CBC: Recent Labs  Lab 02/10/20 1836 02/11/20 0723 02/12/20 0519 02/13/20 0517 02/15/20 0657  WBC 10.5 8.5 8.2 8.4 7.9  NEUTROABS 6.9  --   --  5.9 5.4  HGB 11.7* 11.3* 10.3* 11.0* 10.9*  HCT 36.9 35.8* 33.5* 35.8* 35.6*  MCV 103.4* 103.8* 105.7* 105.9* 105.3*  PLT 182 165 153 167 956   Basic Metabolic Panel: Recent Labs  Lab 02/10/20 1836 02/11/20 0723 02/12/20 0519 02/13/20 0517 02/14/20 0517 02/15/20 0657  NA 141 138 139 138  --  137  K 4.2 4.4 4.5 5.0  --  4.1  CL 103 105 107 102  --  96*  CO2 26 25 25 27   --  31  GLUCOSE 120* 100* 119* 123*  --  101*  BUN 18 16 14 17   --  20  CREATININE 0.97 0.90 0.91 0.94  --  0.84  CALCIUM 9.3 8.6* 8.4* 8.8*  --  8.8*  MG  --  2.4 2.2 2.4 2.0 2.0  PHOS  --   --  3.3  --   --   --    GFR: Estimated Creatinine Clearance: 33.4 mL/min (by C-G formula based on SCr of 0.84 mg/dL). Liver Function Tests: Recent Labs  Lab 02/10/20 1836  AST 19  ALT 13  ALKPHOS 64  BILITOT 1.0  PROT 7.7  ALBUMIN 3.9   No results for input(s): LIPASE, AMYLASE in the last 168 hours. No results for input(s): AMMONIA in the last 168 hours. Coagulation Profile: Recent Labs  Lab 02/10/20 2318  INR 1.1   Cardiac Enzymes: No results for input(s): CKTOTAL, CKMB, CKMBINDEX, TROPONINI in the last 168 hours. BNP (last 3 results) No results for input(s): PROBNP in the last 8760 hours. HbA1C: No results for input(s): HGBA1C in the last 72 hours. CBG: No results for input(s): GLUCAP in the last 168 hours. Lipid Profile: No results for input(s): CHOL, HDL, LDLCALC, TRIG, CHOLHDL, LDLDIRECT in the last 72 hours. Thyroid Function Tests: No results for input(s): TSH, T4TOTAL, FREET4, T3FREE, THYROIDAB in the last 72 hours. Anemia Panel: No results for input(s): VITAMINB12, FOLATE, FERRITIN, TIBC, IRON, RETICCTPCT in the last 72  hours. Sepsis Labs: No results for input(s): PROCALCITON, LATICACIDVEN in the last 168 hours.  Recent Results (from the past 240 hour(s))  Urine Culture     Status: None   Collection Time: 02/10/20  6:00 PM   Specimen: Urine, Random  Result Value Ref Range Status   Specimen Description   Final    URINE, RANDOM Performed at [facility] [address], [place], [place] [address]    Special Requests   Final    NONE Performed at [facility], [place] [address] [place], [place] [address]    Culture   Final    NO GROWTH Performed at [facility], [place] [address] [place], [place] [address]    Report Status 02/11/2020 FINAL  Final  Respiratory Panel by RT PCR (Flu A&B, Covid) - Nasopharyngeal Swab     Status: None   Collection Time: 02/10/20  6:00 PM   Specimen: Nasopharyngeal Swab  Result Value Ref Range Status   SARS Coronavirus 2 by RT PCR NEGATIVE NEGATIVE Final    Comment: (NOTE) SARS-CoV-2 target nucleic acids are NOT DETECTED.  The SARS-CoV-2 RNA is generally detectable in upper respiratoy specimens during the acute phase of infection. The lowest concentration of SARS-CoV-2 viral copies this assay can detect is 131 copies/mL. A negative result does not preclude SARS-Cov-2 infection and should not be used as the sole basis for treatment or other patient management decisions. A negative result may occur with  improper specimen collection/handling, submission of specimen other than nasopharyngeal swab, presence of viral mutation(s) within the areas targeted by this assay, and inadequate number of viral copies (<131 copies/mL). A negative result must be combined with clinical observations, patient history, and epidemiological information. The expected result is Negative.  Fact Sheet for Patients:  PinkCheek.be  Fact Sheet for Healthcare Providers:  GravelBags.it  This test  is no t yet approved or cleared by the [place] FDA and  has been authorized for detection and/or diagnosis of SARS-CoV-2 by FDA under an Emergency Use Authorization (EUA). This EUA will remain  in effect (meaning this test can be used) for the duration of the COVID-19 declaration under Section 564(b)(1) of the Act, 21 U.S.C. section 360bbb-3(b)(1), unless the authorization is terminated or revoked sooner.     Influenza A by PCR NEGATIVE NEGATIVE Final   Influenza B by PCR NEGATIVE NEGATIVE Final    Comment: (NOTE) The Xpert Xpress SARS-CoV-2/FLU/RSV assay is intended as an aid in  the diagnosis of influenza from Nasopharyngeal swab specimens and  should not be used as a sole basis for treatment. Nasal washings and  aspirates are unacceptable for Xpert Xpress SARS-CoV-2/FLU/RSV  testing.  Fact Sheet for Patients: PinkCheek.be  Fact Sheet for Healthcare Providers: GravelBags.it  This test is not yet approved or cleared by the [place] FDA and  has been authorized for detection and/or diagnosis of SARS-CoV-2 by  FDA under an Emergency Use Authorization (EUA). This EUA will remain  in effect (meaning this test can be used) for the duration of the  Covid-19 declaration under Section 564(b)(1) of the Act, 21  U.S.C. section 360bbb-3(b)(1), unless the authorization is  terminated or revoked. Performed at [facility], [place] [address], [place], [place] [address]          Radiology Studies: No results found.      Scheduled Meds: . apixaban  5 mg Oral BID  . atorvastatin  10 mg Oral Daily  . diltiazem  30 mg Oral BID  . levothyroxine  100 mcg Oral Q0600  . metoprolol tartrate  100 mg Oral BID  . mirabegron ER  50 mg Oral QPM  . sodium chloride flush  3 mL Intravenous Q12H  . sodium chloride flush  3 mL Intravenous Q12H  . traZODone  50 mg Oral QHS  . trimethoprim  100 mg Oral Daily    Continuous Infusions: . sodium chloride            Aline August, MD [facility] 02/17/2020, 7:23 AM

## 2020-02-17 NOTE — Progress Notes (Signed)
[facility] ([facility]) Community Based Palliative Care       This patient is enrolled in our palliative care services in the community.  [facility] will continue to follow for any discharge planning needs and to coordinate continuation of palliative care.   If you have questions or need assistance, please call [phone] or contact the hospital Liaison listed on AMION.     Thank you for the opportunity to participate in this patient's care.     Chrislyn King, BSN, RN [facility] Hospital Liaison   [phone]   

## 2020-02-18 DIAGNOSIS — N390 Urinary tract infection, site not specified: Secondary | ICD-10-CM | POA: Diagnosis not present

## 2020-02-18 DIAGNOSIS — J9601 Acute respiratory failure with hypoxia: Secondary | ICD-10-CM | POA: Diagnosis not present

## 2020-02-18 DIAGNOSIS — R1312 Dysphagia, oropharyngeal phase: Secondary | ICD-10-CM | POA: Diagnosis not present

## 2020-02-18 DIAGNOSIS — I480 Paroxysmal atrial fibrillation: Secondary | ICD-10-CM | POA: Diagnosis not present

## 2020-02-18 DIAGNOSIS — R41841 Cognitive communication deficit: Secondary | ICD-10-CM | POA: Diagnosis not present

## 2020-02-18 DIAGNOSIS — N183 Chronic kidney disease, stage 3 unspecified: Secondary | ICD-10-CM | POA: Diagnosis not present

## 2020-02-18 DIAGNOSIS — R110 Nausea: Secondary | ICD-10-CM | POA: Diagnosis not present

## 2020-02-18 DIAGNOSIS — I119 Hypertensive heart disease without heart failure: Secondary | ICD-10-CM | POA: Diagnosis not present

## 2020-02-18 DIAGNOSIS — I2601 Septic pulmonary embolism with acute cor pulmonale: Secondary | ICD-10-CM | POA: Diagnosis not present

## 2020-02-18 DIAGNOSIS — I4891 Unspecified atrial fibrillation: Secondary | ICD-10-CM | POA: Diagnosis not present

## 2020-02-18 DIAGNOSIS — R4189 Other symptoms and signs involving cognitive functions and awareness: Secondary | ICD-10-CM | POA: Diagnosis not present

## 2020-02-18 DIAGNOSIS — I2699 Other pulmonary embolism without acute cor pulmonale: Secondary | ICD-10-CM | POA: Diagnosis not present

## 2020-02-18 DIAGNOSIS — I5033 Acute on chronic diastolic (congestive) heart failure: Secondary | ICD-10-CM | POA: Diagnosis not present

## 2020-02-18 DIAGNOSIS — R911 Solitary pulmonary nodule: Secondary | ICD-10-CM | POA: Diagnosis not present

## 2020-02-18 DIAGNOSIS — R5381 Other malaise: Secondary | ICD-10-CM | POA: Diagnosis not present

## 2020-02-18 DIAGNOSIS — R498 Other voice and resonance disorders: Secondary | ICD-10-CM | POA: Diagnosis not present

## 2020-02-18 DIAGNOSIS — J984 Other disorders of lung: Secondary | ICD-10-CM | POA: Diagnosis not present

## 2020-02-18 DIAGNOSIS — R2681 Unsteadiness on feet: Secondary | ICD-10-CM | POA: Diagnosis not present

## 2020-02-18 DIAGNOSIS — I5032 Chronic diastolic (congestive) heart failure: Secondary | ICD-10-CM | POA: Diagnosis not present

## 2020-02-18 DIAGNOSIS — I2510 Atherosclerotic heart disease of native coronary artery without angina pectoris: Secondary | ICD-10-CM | POA: Diagnosis not present

## 2020-02-18 DIAGNOSIS — I10 Essential (primary) hypertension: Secondary | ICD-10-CM | POA: Diagnosis not present

## 2020-02-18 DIAGNOSIS — R2689 Other abnormalities of gait and mobility: Secondary | ICD-10-CM | POA: Diagnosis not present

## 2020-02-18 DIAGNOSIS — E039 Hypothyroidism, unspecified: Secondary | ICD-10-CM | POA: Diagnosis not present

## 2020-02-18 DIAGNOSIS — J9600 Acute respiratory failure, unspecified whether with hypoxia or hypercapnia: Secondary | ICD-10-CM | POA: Diagnosis not present

## 2020-02-18 DIAGNOSIS — D539 Nutritional anemia, unspecified: Secondary | ICD-10-CM | POA: Diagnosis not present

## 2020-02-18 DIAGNOSIS — I5031 Acute diastolic (congestive) heart failure: Secondary | ICD-10-CM | POA: Diagnosis not present

## 2020-02-18 DIAGNOSIS — R6889 Other general symptoms and signs: Secondary | ICD-10-CM | POA: Diagnosis not present

## 2020-02-18 DIAGNOSIS — E785 Hyperlipidemia, unspecified: Secondary | ICD-10-CM | POA: Diagnosis not present

## 2020-02-18 DIAGNOSIS — M6281 Muscle weakness (generalized): Secondary | ICD-10-CM | POA: Diagnosis not present

## 2020-02-18 LAB — BASIC METABOLIC PANEL
Anion gap: 9 (ref 5–15)
BUN: 10 mg/dL (ref 8–23)
CO2: 31 mmol/L (ref 22–32)
Calcium: 8.9 mg/dL (ref 8.9–10.3)
Chloride: 97 mmol/L — ABNORMAL LOW (ref 98–111)
Creatinine, Ser: 0.78 mg/dL (ref 0.44–1.00)
GFR, Estimated: >60 mL/min (ref >60)
Glucose, Bld: 104 mg/dL — ABNORMAL HIGH (ref 70–99)
Potassium: 4 mmol/L (ref 3.5–5.1)
Sodium: 137 mmol/L (ref 135–145)

## 2020-02-18 LAB — MAGNESIUM: Magnesium: 2.2 mg/dL (ref 1.7–2.4)

## 2020-02-18 MED ORDER — ATORVASTATIN CALCIUM 10 MG PO TABS: 10.0000 mg | ORAL_TABLET | Freq: Every day | ORAL | 0 refills | Status: AC

## 2020-02-18 MED ORDER — METOPROLOL TARTRATE 50 MG PO TABS: 100.0000 mg | ORAL_TABLET | Freq: Two times a day (BID) | ORAL | Status: DC

## 2020-02-18 MED ORDER — APIXABAN 5 MG PO TABS
5.0000 mg | ORAL_TABLET | Freq: Two times a day (BID) | ORAL | 0 refills | Status: DC
Start: 2020-02-18 — End: 2020-08-11

## 2020-02-18 MED ORDER — DILTIAZEM HCL 30 MG PO TABS
30.0000 mg | ORAL_TABLET | Freq: Two times a day (BID) | ORAL | 0 refills | Status: DC
Start: 2020-02-18 — End: 2020-05-28

## 2020-02-18 NOTE — TOC Progression Note (Addendum)
Transition of Care [facility]) - Progression Note    Patient Details  Name: [name] MRN: [id number] Date of Birth: [birth year]  [facility] CM/SW Contact  [name], Lori Pulse, RN Phone Number: 02/18/2020, 12:28 PM  Clinical Narrative: Awaiting [employer] to deliver home 02 to rm once documented 02 sats by nsg. 1:32p-Noted 02 sats are in-[employer] rep [name] aware to deliver tank to rm;also HTA denial letter for Non emergency transportation delivered to son-[name]-they will not appeal transportation.   3:52p-Received call from nsg floor-concerns about  home 02 tank functioning-CM TC home 02 rep-to check tank, & if needed deliver another tank to rm.   Expected Discharge Plan: [place] Barriers to Discharge: No Barriers Identified  Expected Discharge Plan and Services Expected Discharge Plan: [place]   Discharge Planning Services: CM Consult Post Acute Care Choice: [place] arrangements for the past 2 months: Single Family Home Expected Discharge Date: 02/18/20               DME Arranged: Oxygen DME Agency: [employer] Date DME Agency Contacted: 02/18/20 Time DME Agency Contacted: 1227 Representative spoke with at DME Agency: [name]: RN           Social Determinants of Health ([place]) Interventions    Readmission Risk Interventions No flowsheet data found.

## 2020-02-18 NOTE — Progress Notes (Signed)
Brief cardiology note: Patient is pending discharge. I recommend not prescribing both amlodipine and diltiazem at discharge.   [facility] will sign off.   Medication Recommendations:  STOP home amlodipine (has not received in hospital), home aspirin (now on anticoagulation), home imdur, home enalapril (low blood pressures) START apixaban 5 mg BID, diltiazem 30 mg BID Increase home metoprolol to 100 mg BID Other recommendations (labs, testing, etc):  none Follow up as an outpatient:  We will arrange for outpatient cardiology follow up  Buford Dresser, MD, PhD [facility]  [address] [place], [place] [address] [phone]

## 2020-02-18 NOTE — Progress Notes (Signed)
SATURATION QUALIFICATIONS: (This note is used to comply with regulatory documentation for home oxygen)  Patient Saturations on Room Air at Rest =85%  Patient Saturations on Room Air while Ambulating = 85%  Patient Saturations on 93% 3L Liters of oxygen while Ambulating = 90%  Please briefly explain why patient needs home oxygen: pt requires O2 to SAT above 88% rest or activity

## 2020-02-18 NOTE — Discharge Summary (Signed)
Physician Discharge Summary  [name] OMV:[id number] DOB: [birth year] DOA: 02/10/2020  PCP: Holland Commons, FNP  Admit date: 02/10/2020 Discharge date: 02/18/2020  Admitted From: Home Disposition: SNF  Recommendations for Outpatient Follow-up:  1. Follow up with SNF provider at earliest convenience 2. Outpatient follow-up with palliative care. If condition were to worsen, consider hospice/comfort measures 3. Outpatient follow-up with cardiology 4. Follow up in ED if symptoms worsen or new appear   Home Health: No Equipment/Devices: Currently requiring oxygen via nasal cannula at 2 L/min  Discharge Condition: Guarded to poor CODE STATUS: DNR Diet recommendation: Heart healthy  Brief/Interim Summary: 86 year old female with history of hypertension, CAD, chronic diastolic CHF, hypothyroidism and recurrent UTI presented with shortness of breath, aches and malaise.  On presentation, she was saturating in the mid to upper 80s on room air, tachycardic in the 110s, blood pressure was stable.  EKG showed atrial fibrillation.  CT of the head was negative for any acute intracranial abnormality.  CT of the abdomen and pelvis showed no acute findings.  CTA chest showed acute pulmonary embolism and right upper lobe nodule.  COVID-19 test was negative.  She was started on IV heparin.  She was subsequently switched to oral Eliquis on 02/12/2020.  Cardiology was consulted for A. fib with RVR. Her heart rate gradually improved although she still intermittently tachycardic, currently on metoprolol and oral Cardizem. PT recommended SNF placement. Still requiring some supplemental oxygen at 2 L/min nasal cannula. Discharge to SNF once bed is available. Palliative care has also been involved in the care and if condition were to worsen, recommend hospice/comfort measures. Outpatient follow-up with palliative care.  Discharge Diagnoses:   Acute PE Acute hypoxic respiratory failure -CTA of the chest  showed acute pulmonary embolism.  Initially started on IV heparin but was switched to oral Eliquis on 02/12/2020. -Lower extremity duplex did not show any evidence of lower extremity DVT.  Echo showed EF of 60 to 65% with moderate MR -Currently on 2 to 3 L oxygen by nasal cannula. Will need supplemental oxygen on discharge. -PT recommended SNF placement. Discharge to SNF once bed is available.   Acute on chronic diastolic CHF -Continue fluid restriction. Continue increased dose of metoprolol and oral Cardizem. Outpatient follow-up with cardiology. Received few doses of IV Lasix. Cardiology did not recommend any more Lasix.  Paroxysmal A. fib with RVR -Likely precipitated by PE.  Still tachycardic but heart rates are much improved.  Continue metoprolol and oral Cardizem. Cardiology has signed off; outpatient follow-up with cardiology.  Anticoagulation plan as above.  Essential hypertension -Blood pressure stable.  Continue metoprolol and Cardizem.  Hypothyroidism -Continue Synthroid  CKD IIIa -creatinine stable  Lung nodule -Incidentally noted on CT on presentation.  Outpatient follow-up  Generalized deconditioning -PT recommends SNF placement. Palliative care following.  Patient will need outpatient palliative care follow-up.  If condition were to deteriorate, consider hospice/comfort measures   Discharge Instructions  Discharge Instructions    Amb Referral to Palliative Care   Complete by: As directed    Continue goals of care discussion.   Ambulatory referral to Cardiology   Complete by: As directed    A. fib follow-up   Diet - low sodium heart healthy   Complete by: As directed    Increase activity slowly   Complete by: As directed      Allergies as of 02/18/2020   No Known Allergies     Medication List    STOP taking these medications  amLODipine 10 MG tablet Commonly known as: NORVASC   aspirin 81 MG tablet   cephALEXin 500 MG capsule Commonly known  as: KEFLEX   enalapril 10 MG tablet Commonly known as: VASOTEC   isosorbide mononitrate 30 MG 24 hr tablet Commonly known as: IMDUR   simvastatin 20 MG tablet Commonly known as: ZOCOR   traZODone 50 MG tablet Commonly known as: DESYREL     TAKE these medications   acetaminophen 500 MG tablet Commonly known as: TYLENOL Take 500 mg by mouth every 6 (six) hours as needed for moderate pain.   apixaban 5 MG Tabs tablet Commonly known as: ELIQUIS Take 1 tablet (5 mg total) by mouth 2 (two) times daily.   atorvastatin 10 MG tablet Commonly known as: LIPITOR Take 1 tablet (10 mg total) by mouth daily. Start taking on: February 19, 2020   diltiazem 30 MG tablet Commonly known as: CARDIZEM Take 1 tablet (30 mg total) by mouth 2 (two) times daily.   hydrOXYzine 25 MG tablet Commonly known as: ATARAX/VISTARIL Take 25 mg by mouth at bedtime as needed for anxiety.   levothyroxine 100 MCG tablet Commonly known as: SYNTHROID Take 1 tablet by mouth daily.   metoprolol tartrate 50 MG tablet Commonly known as: LOPRESSOR Take 2 tablets (100 mg total) by mouth 2 (two) times daily. TAKE 1 TABLET BY MOUTH 2 TIMES DAILY. What changed:   how much to take  how to take this  when to take this   multivitamin tablet Take 1 tablet by mouth every evening.   Myrbetriq 50 MG Tb24 tablet Generic drug: mirabegron ER Take 50 mg by mouth every evening.   nitroGLYCERIN 0.4 MG SL tablet Commonly known as: NITROSTAT Place 0.4 mg under the tongue every 5 (five) minutes as needed. For chest pain   trimethoprim 100 MG tablet Commonly known as: TRIMPEX Take 100 mg by mouth daily.   vitamin B-12 500 MCG tablet Commonly known as: CYANOCOBALAMIN Take 500 mcg by mouth daily.       Contact information for after-discharge care    Destination    [facility] .   Service: Skilled Nursing Contact information: [place]  [place] [phone]                 No Known Allergies  Consultations:  Cardiology/palliative care   Procedures/Studies: CT Head Wo Contrast  Result Date: 02/10/2020 CLINICAL DATA:  Dizziness.  Mental status change. EXAM: CT HEAD WITHOUT CONTRAST TECHNIQUE: Contiguous axial images were obtained from the base of the skull through the vertex without intravenous contrast. COMPARISON:  None. FINDINGS: The study is mildly motion degraded. Brain: No acute large territory infarct, intracranial hemorrhage, mass, midline shift, or extra-axial fluid collection is identified. Lacunar infarcts in the left basal ganglia and left thalamus are likely chronic. Hypodensities in the cerebral white matter bilaterally are nonspecific but compatible with moderate chronic small vessel ischemic disease. A small age indeterminate cortical infarct is questioned in the left parietal lobe although this is in a region of motion and may be artifactual. There is mild lateral and third ventriculomegaly which may reflect central predominant cerebral atrophy with hydrocephalus considered less likely. Vascular: Calcified atherosclerosis at the skull base. No hyperdense vessel. Skull: No fracture or suspicious osseous lesion. Sinuses/Orbits: Visualized paranasal sinuses and mastoid air cells are clear. Bilateral cataract extraction. Other: Likely iatrogenic venous gas in the right frontotemporal scalp. IMPRESSION: 1. No evidence of acute large territory infarct or  intracranial hemorrhage. 2. Small age indeterminate left parietal cortical infarct versus artifact. 3. Moderate chronic small vessel ischemic disease. Electronically Signed   By: Logan Bores M.D.   On: 02/10/2020 21:32   CT Angio Chest PE W and/or Wo Contrast  Result Date: 02/10/2020 CLINICAL DATA:  86 year old with shortness of breath. New onset atrial fibrillation. Shortness of breath for 3 days. Low back pain. Weakness. Nausea. EXAM: CT ANGIOGRAPHY CHEST WITH  CONTRAST TECHNIQUE: Multidetector CT imaging of the chest was performed using the standard protocol during bolus administration of intravenous contrast. Multiplanar CT image reconstructions and MIPs were obtained to evaluate the vascular anatomy. Performed in conjunction with CT of the abdomen and pelvis, reported separately. CONTRAST:  176mL OMNIPAQUE IOHEXOL 350 MG/ML SOLN COMPARISON:  No recent exams.  Chest CT 02/14/2012. FINDINGS: Cardiovascular: Positive for acute pulmonary embolus with filling defect in the segmental branches of the left lower lobe, series 9 image 69, and possibly a few subsegmental branches in the right lower lobe, series 9 image 71. Thromboembolic burden is small. Probable contrast mixing in the left atrial appendage. Mild right heart dilatation as well as biatrial enlargement. Contrast refluxes into the hepatic veins and IVC. There are coronary artery calcifications. Dense aortic atherosclerosis. No aortic aneurysm. Mediastinum/Nodes: No mediastinal or hilar adenopathy. No esophageal wall thickening. No visualized thyroid nodule. Lungs/Pleura: Mild biapical pleuroparenchymal scarring. Breathing motion artifact limits detailed parenchymal assessment. Small bilateral pleural effusions with minimal adjacent atelectasis. Subsegmental opacities in the periphery of the right lower and middle lobe favor atelectasis. There is an 8 mm ground-glass nodule at the right lung apex, series 7, image 29. No evidence of tracheal or bronchial lesions, motion partially obscures detailed assessment. No septal thickening or convincing pulmonary edema Upper Abdomen: Assessed on concurrent abdominal CT. Contrast refluxes into the hepatic veins and IVC. Musculoskeletal: There are no acute or suspicious osseous abnormalities. Review of the MIP images confirms the above findings. IMPRESSION: 1. Positive for acute pulmonary embolus in the segmental branches of the left lower lobe and possibly a few subsegmental  branches in the right lower lobe. Thromboembolic burden is small. 2. Mild right heart dilatation as well as biatrial enlargement. Contrast refluxes into the hepatic veins and IVC, consistent with elevated right heart pressures. 3. Small bilateral pleural effusions with minimal adjacent atelectasis. 4. Right upper lobe 8 mm ground-glass nodule. Initial follow-up with CT at 6-12 months is recommended to confirm persistence. If persistent, repeat CT is recommended every 2 years until 5 years of stability has been established, giving consideration for patient's advanced age. This recommendation follows the consensus statement: Guidelines for Management of Incidental Pulmonary Nodules Detected on CT Images: From the Fleischner Society 2017; Radiology 2017; 284:228-243. Aortic Atherosclerosis (ICD10-I70.0). Critical Value/emergent results were called by telephone at the time of interpretation on 02/10/2020 at 9:35 pm to Dr Shirlyn Goltz , who verbally acknowledged these results. Electronically Signed   By: Keith Rake M.D.   On: 02/10/2020 21:36   CT ABDOMEN PELVIS W CONTRAST  Result Date: 02/10/2020 CLINICAL DATA:  Abdominal distension. Low back pain. Weakness. Nausea. EXAM: CT ABDOMEN AND PELVIS WITH CONTRAST TECHNIQUE: Multidetector CT imaging of the abdomen and pelvis was performed using the standard protocol following bolus administration of intravenous contrast. CONTRAST:  19mL OMNIPAQUE IOHEXOL 350 MG/ML SOLN COMPARISON:  None. FINDINGS: Lower chest: Assessed on concurrent chest CT, reported separately. Small bilateral pleural effusions. Hepatobiliary: Motion artifact limitations. No focal liver abnormality is seen. Gallbladder is not well-defined on the current  exam, may be completely decompressed or surgically absent. There is no biliary dilatation. Pancreas: Motion limited evaluation. No evidence of peripancreatic inflammation. No ductal dilatation. No evidence of pancreatic mass. Spleen: Normal in size  without focal abnormality. Adrenals/Urinary Tract: Normal adrenal glands. No hydronephrosis or perinephric edema. Simple cyst in the upper left kidney. Subcentimeter cortical hypodensity in the mid anterior right kidney is too small to characterize. Urinary bladder is completely empty. Stomach/Bowel: Bowel evaluation is limited due to motion and absence of enteric contrast. No abnormal gastric distension. Small duodenal diverticulum without inflammation. There is no bowel dilatation to suggest obstruction. No evidence of bowel inflammation. Colonic diverticulosis is prominent in the sigmoid colon. There is no diverticulitis or pericolonic edema. Normal appendix. Vascular/Lymphatic: Prominent aortic and branch atherosclerosis. No aortic aneurysm. The portal vein is patent. No abdominopelvic adenopathy. Reproductive: Status post hysterectomy. No adnexal masses. Other: No free air or ascites.  No body wall hernia. Musculoskeletal: Multilevel degenerative change throughout the lumbar spine with near complete disc space loss at L2-L3, L3-L4, and L4-L5. There is multilevel facet hypertrophy. Bones are under mineralized. No acute osseous findings. No compression fracture. IMPRESSION: 1. No acute abnormality in the abdomen/pelvis, allowing for motion artifact. 2. Colonic diverticulosis without diverticulitis. 3. Multilevel degenerative change throughout the lumbar spine. Aortic Atherosclerosis (ICD10-I70.0). Electronically Signed   By: Keith Rake M.D.   On: 02/10/2020 21:40   DG CHEST PORT 1 VIEW  Result Date: 02/14/2020 CLINICAL DATA:  Shortness of breath EXAM: PORTABLE CHEST 1 VIEW COMPARISON:  03/21/2016 in CT of the chest of 02/10/2020 FINDINGS: Image rotated to the RIGHT. Accounting for this there is mild cardiac enlargement. Increasing density along the LEFT heart border with profile that suggests potential asymmetric soft tissue, perhaps related to breast tissue though with blunting of both RIGHT and LEFT  costodiaphragmatic sulcus and graded opacity in the RIGHT and LEFT chest. Obscured LEFT hemidiaphragm with partially obscured RIGHT hemidiaphragm on today's study. No acute skeletal process on limited assessment with signs of prior LEFT humeral fracture partially imaged. IMPRESSION: 1. Cardiomegaly with bilateral pleural effusions and basilar opacities likely atelectasis or infiltrate. 2. Increased density along the LEFT heart border with profile that may represent asymmetric soft tissue related to patient rotation and positioning. Worsening effusion or space process is also considered, attention on follow-up. Electronically Signed   By: Zetta Bills M.D.   On: 02/14/2020 08:13   ECHOCARDIOGRAM COMPLETE  Result Date: 02/11/2020    ECHOCARDIOGRAM REPORT   Patient Name:   [name] [facility] Date of Exam: 02/11/2020 Medical Rec #:  [id number]      Height:       65.0 in Accession #:    [id number]     Weight:       128.1 lb Date of Birth:  October 22, 1920      BSA:          1.637 m Patient Age:    89 years       BP:           105/77 mmHg Patient Gender: F              HR:           120 bpm. Exam Location:  Inpatient Procedure: 2D Echo, Cardiac Doppler and Color Doppler Indications:    Pulmonary Embolus, Atrial fibrillation  History:        Patient has prior history of Echocardiogram examinations, most  recent 03/30/2016. CHF, CAD, Arrythmias:Atrial Fibrillation,                 Signs/Symptoms:Shortness of Breath; Risk Factors:Hypertension.  Sonographer:    Dustin Flock Referring Phys: [phone] [place]  1. Left ventricular ejection fraction, by estimation, is 60 to 65%. The left ventricle has normal function. The left ventricle has no regional wall motion abnormalities. There is mild left ventricular hypertrophy. Left ventricular diastolic function could not be evaluated.  2. Right ventricular systolic function is normal. The right ventricular size is normal. There is moderately elevated  pulmonary artery systolic pressure. The estimated right ventricular systolic pressure is 75.6 mmHg.  3. The mitral valve is abnormal. Moderate mitral valve regurgitation.  4. The aortic valve is tricuspid. Aortic valve regurgitation is not visualized. Mild aortic valve sclerosis is present, with no evidence of aortic valve stenosis.  5. The inferior vena cava is normal in size with <50% respiratory variability, suggesting right atrial pressure of 8 mmHg. Comparison(s): Prior images unable to be directly viewed, comparison made by report only. 03/30/2016: LVEF 65-70%, grade 1 DD with high LV filling pressure, trivial MR, RVSP 28 mmHg. FINDINGS  Left Ventricle: Left ventricular ejection fraction, by estimation, is 60 to 65%. The left ventricle has normal function. The left ventricle has no regional wall motion abnormalities. The left ventricular internal cavity size was normal in size. There is  mild left ventricular hypertrophy. Left ventricular diastolic function could not be evaluated due to atrial fibrillation. Left ventricular diastolic function could not be evaluated. Right Ventricle: The right ventricular size is normal. No increase in right ventricular wall thickness. Right ventricular systolic function is normal. There is moderately elevated pulmonary artery systolic pressure. The tricuspid regurgitant velocity is 3.41 m/s, and with an assumed right atrial pressure of 8 mmHg, the estimated right ventricular systolic pressure is 43.3 mmHg. Left Atrium: Left atrial size was normal in size. Right Atrium: Right atrial size was normal in size. Pericardium: There is no evidence of pericardial effusion. Mitral Valve: The mitral valve is abnormal. There is mild thickening of the mitral valve leaflet(s). Mild to moderate mitral annular calcification. Moderate mitral valve regurgitation, with posteriorly-directed jet. Tricuspid Valve: The tricuspid valve is grossly normal. Tricuspid valve regurgitation is mild. Aortic  Valve: The aortic valve is tricuspid. Aortic valve regurgitation is not visualized. Mild aortic valve sclerosis is present, with no evidence of aortic valve stenosis. Pulmonic Valve: The pulmonic valve was grossly normal. Pulmonic valve regurgitation is trivial. Aorta: The aortic root and ascending aorta are structurally normal, with no evidence of dilitation. Venous: The inferior vena cava is normal in size with less than 50% respiratory variability, suggesting right atrial pressure of 8 mmHg. IAS/Shunts: No atrial level shunt detected by color flow Doppler.  LEFT VENTRICLE PLAX 2D LVIDd:         3.30 cm  Diastology LVIDs:         2.20 cm  LV e' medial:    6.20 cm/s LV PW:         1.10 cm  LV E/e' medial:  20.2 LV IVS:        1.10 cm  LV e' lateral:   9.90 cm/s LVOT diam:     1.90 cm  LV E/e' lateral: 12.6 LV SV:         46 LV SV Index:   28 LVOT Area:     2.84 cm  RIGHT VENTRICLE RV Basal diam:  2.90 cm RV  S prime:     10.60 cm/s TAPSE (M-mode): 3.0 cm LEFT ATRIUM             Index       RIGHT ATRIUM           Index LA diam:        4.10 cm 2.50 cm/m  RA Area:     14.90 cm LA Vol (A2C):   42.6 ml 26.02 ml/m RA Volume:   32.70 ml  19.98 ml/m LA Vol (A4C):   52.9 ml 32.32 ml/m LA Biplane Vol: 47.8 ml 29.20 ml/m  AORTIC VALVE LVOT Vmax:   80.20 cm/s LVOT Vmean:  56.700 cm/s LVOT VTI:    0.163 m  AORTA Ao Root diam: 2.70 cm MITRAL VALVE                TRICUSPID VALVE MV Area (PHT): 3.08 cm     TR Peak grad:   46.5 mmHg MV Decel Time: 246 msec     TR Vmax:        341.00 cm/s MV E velocity: 125.00 cm/s                             SHUNTS                             Systemic VTI:  0.16 m                             Systemic Diam: 1.90 cm Lyman Bishop MD Electronically signed by Lyman Bishop MD Signature Date/Time: 02/11/2020/10:36:48 AM    Final    VAS [place] LOWER EXTREMITY VENOUS (DVT)  Result Date: 02/11/2020  Lower Venous DVT Study Indications: Pulmonary embolism.  Risk Factors: None identified. Limitations:  Poor ultrasound/tissue interface. Comparison Study: No prior studies. Performing Technologist: Oliver Hum RVT  Examination Guidelines: A complete evaluation includes B-mode imaging, spectral Doppler, color Doppler, and power Doppler as needed of all accessible portions of each vessel. Bilateral testing is considered an integral part of a complete examination. Limited examinations for reoccurring indications may be performed as noted. The reflux portion of the exam is performed with the patient in reverse Trendelenburg.  +---------+---------------+---------+-----------+----------+--------------+ RIGHT    CompressibilityPhasicitySpontaneityPropertiesThrombus Aging +---------+---------------+---------+-----------+----------+--------------+ CFV      Full           Yes      Yes                                 +---------+---------------+---------+-----------+----------+--------------+ SFJ      Full                                                        +---------+---------------+---------+-----------+----------+--------------+ FV Prox  Full                                                        +---------+---------------+---------+-----------+----------+--------------+ FV Mid   Full                                                        +---------+---------------+---------+-----------+----------+--------------+  FV DistalFull                                                        +---------+---------------+---------+-----------+----------+--------------+ PFV      Full                                                        +---------+---------------+---------+-----------+----------+--------------+ POP      Full           Yes      Yes                                 +---------+---------------+---------+-----------+----------+--------------+ PTV      Full                                                         +---------+---------------+---------+-----------+----------+--------------+ PERO     Full                                                        +---------+---------------+---------+-----------+----------+--------------+   +---------+---------------+---------+-----------+----------+--------------+ LEFT     CompressibilityPhasicitySpontaneityPropertiesThrombus Aging +---------+---------------+---------+-----------+----------+--------------+ CFV      Full           Yes      Yes                                 +---------+---------------+---------+-----------+----------+--------------+ SFJ      Full                                                        +---------+---------------+---------+-----------+----------+--------------+ FV Prox  Full                                                        +---------+---------------+---------+-----------+----------+--------------+ FV Mid   Full                                                        +---------+---------------+---------+-----------+----------+--------------+ FV DistalFull                                                        +---------+---------------+---------+-----------+----------+--------------+  PFV      Full                                                        +---------+---------------+---------+-----------+----------+--------------+ POP      Full           Yes      Yes                                 +---------+---------------+---------+-----------+----------+--------------+ PTV      Full                                                        +---------+---------------+---------+-----------+----------+--------------+ PERO     Full                                                        +---------+---------------+---------+-----------+----------+--------------+     Summary: RIGHT: - There is no evidence of deep vein thrombosis in the lower extremity.  - No cystic structure found in  the popliteal fossa.  LEFT: - There is no evidence of deep vein thrombosis in the lower extremity.  - No cystic structure found in the popliteal fossa.  *See table(s) above for measurements and observations. Electronically signed by Deitra Mayo MD on 02/11/2020 at 5:52:53 PM.    Final        Subjective: Patient seen and examined at bedside. Awake, answers some questions but a poor historian. No overnight fever, vomiting or worsening shortness of breath.  Discharge Exam: Vitals:   02/17/20 2038 02/18/20 0849  BP: 131/83 127/75  Pulse: (!) 108 (!) 118  Resp: 20 20  Temp: 98 F (36.7 C) 98 F (36.7 C)  SpO2: 96% 96%    General: Pt is awake, poor historian. Currently on 2 L oxygen via nasal cannula Cardiovascular: S1-S2 positive, slightly tachycardic Respiratory: bilateral decreased breath sounds at bases with some scattered crackles Abdominal: Soft, NT, ND, bowel sounds + Extremities: Trace lower extremity edema; no cyanosis    The results of significant diagnostics from this hospitalization (including imaging, microbiology, ancillary and laboratory) are listed below for reference.     Microbiology: Recent Results (from the past 240 hour(s))  Urine Culture     Status: None   Collection Time: 02/10/20  6:00 PM   Specimen: Urine, Random  Result Value Ref Range Status   Specimen Description   Final    URINE, RANDOM Performed at [facility] [address] [place], [place] [address]    Special Requests   Final    NONE Performed at [facility], [place] [address], [place], [place] [address]    Culture   Final    NO GROWTH Performed at [facility], [place] [address] [place], [place] [address]    Report Status 02/11/2020 FINAL  Final  Respiratory Panel by RT PCR (Flu A&B, Covid) - Nasopharyngeal Swab  Status: None   Collection Time: 02/10/20  6:00 PM   Specimen: Nasopharyngeal Swab  Result Value Ref Range Status   SARS  Coronavirus 2 by RT PCR NEGATIVE NEGATIVE Final    Comment: (NOTE) SARS-CoV-2 target nucleic acids are NOT DETECTED.  The SARS-CoV-2 RNA is generally detectable in upper respiratoy specimens during the acute phase of infection. The lowest concentration of SARS-CoV-2 viral copies this assay can detect is 131 copies/mL. A negative result does not preclude SARS-Cov-2 infection and should not be used as the sole basis for treatment or other patient management decisions. A negative result may occur with  improper specimen collection/handling, submission of specimen other than nasopharyngeal swab, presence of viral mutation(s) within the areas targeted by this assay, and inadequate number of viral copies (<131 copies/mL). A negative result must be combined with clinical observations, patient history, and epidemiological information. The expected result is Negative.  Fact Sheet for Patients:  PinkCheek.be  Fact Sheet for Healthcare Providers:  GravelBags.it  This test is no t yet approved or cleared by the [place] FDA and  has been authorized for detection and/or diagnosis of SARS-CoV-2 by FDA under an Emergency Use Authorization (EUA). This EUA will remain  in effect (meaning this test can be used) for the duration of the COVID-19 declaration under Section 564(b)(1) of the Act, 21 U.S.C. section 360bbb-3(b)(1), unless the authorization is terminated or revoked sooner.     Influenza A by PCR NEGATIVE NEGATIVE Final   Influenza B by PCR NEGATIVE NEGATIVE Final    Comment: (NOTE) The Xpert Xpress SARS-CoV-2/FLU/RSV assay is intended as an aid in  the diagnosis of influenza from Nasopharyngeal swab specimens and  should not be used as a sole basis for treatment. Nasal washings and  aspirates are unacceptable for Xpert Xpress SARS-CoV-2/FLU/RSV  testing.  Fact Sheet for  Patients: PinkCheek.be  Fact Sheet for Healthcare Providers: GravelBags.it  This test is not yet approved or cleared by the [place] FDA and  has been authorized for detection and/or diagnosis of SARS-CoV-2 by  FDA under an Emergency Use Authorization (EUA). This EUA will remain  in effect (meaning this test can be used) for the duration of the  Covid-19 declaration under Section 564(b)(1) of the Act, 21  U.S.C. section 360bbb-3(b)(1), unless the authorization is  terminated or revoked. Performed at [facility], [place] [address] [place], [place] [address]   SARS Coronavirus 2 by RT PCR (hospital order, performed in [facility] hospital lab) Nasopharyngeal Nasopharyngeal Swab     Status: None   Collection Time: 02/17/20  9:28 AM   Specimen: Nasopharyngeal Swab  Result Value Ref Range Status   SARS Coronavirus 2 NEGATIVE NEGATIVE Final    Comment: (NOTE) SARS-CoV-2 target nucleic acids are NOT DETECTED.  The SARS-CoV-2 RNA is generally detectable in upper and lower respiratory specimens during the acute phase of infection. The lowest concentration of SARS-CoV-2 viral copies this assay can detect is 250 copies / mL. A negative result does not preclude SARS-CoV-2 infection and should not be used as the sole basis for treatment or other patient management decisions.  A negative result may occur with improper specimen collection / handling, submission of specimen other than nasopharyngeal swab, presence of viral mutation(s) within the areas targeted by this assay, and inadequate number of viral copies (<250 copies / mL). A negative result must be combined with clinical observations, patient history, and epidemiological information.  Fact Sheet for Patients:   StrictlyIdeas.no  Fact  Sheet for Healthcare Providers: BankingDealers.co.za  This test is not yet  approved or  cleared by the [place] and has been authorized for detection and/or diagnosis of SARS-CoV-2 by FDA under an Emergency Use Authorization (EUA).  This EUA will remain in effect (meaning this test can be used) for the duration of the COVID-19 declaration under Section 564(b)(1) of the Act, 21 U.S.C. section 360bbb-3(b)(1), unless the authorization is terminated or revoked sooner.  Performed at [facility], [place] [address] [place], [place] [address]      Labs: BNP (last 3 results) Recent Labs    02/10/20 1836  BNP 099.8*   Basic Metabolic Panel: Recent Labs  Lab 02/12/20 0519 02/13/20 0517 02/14/20 0517 02/15/20 0657 02/18/20 0618  NA 139 138  --  137 137  K 4.5 5.0  --  4.1 4.0  CL 107 102  --  96* 97*  CO2 25 27  --  31 31  GLUCOSE 119* 123*  --  101* 104*  BUN 14 17  --  20 10  CREATININE 0.91 0.94  --  0.84 0.78  CALCIUM 8.4* 8.8*  --  8.8* 8.9  MG 2.2 2.4 2.0 2.0 2.2  PHOS 3.3  --   --   --   --    Liver Function Tests: No results for input(s): AST, ALT, ALKPHOS, BILITOT, PROT, ALBUMIN in the last 168 hours. No results for input(s): LIPASE, AMYLASE in the last 168 hours. No results for input(s): AMMONIA in the last 168 hours. CBC: Recent Labs  Lab 02/12/20 0519 02/13/20 0517 02/15/20 0657  WBC 8.2 8.4 7.9  NEUTROABS  --  5.9 5.4  HGB 10.3* 11.0* 10.9*  HCT 33.5* 35.8* 35.6*  MCV 105.7* 105.9* 105.3*  PLT 153 167 213   Cardiac Enzymes: No results for input(s): CKTOTAL, CKMB, CKMBINDEX, TROPONINI in the last 168 hours. BNP: Invalid input(s): POCBNP CBG: No results for input(s): GLUCAP in the last 168 hours. D-Dimer No results for input(s): DDIMER in the last 72 hours. Hgb A1c No results for input(s): HGBA1C in the last 72 hours. Lipid Profile No results for input(s): CHOL, HDL, LDLCALC, TRIG, CHOLHDL, LDLDIRECT in the last 72 hours. Thyroid function studies No results for input(s): TSH, T4TOTAL, T3FREE,  THYROIDAB in the last 72 hours.  Invalid input(s): FREET3 Anemia work up No results for input(s): VITAMINB12, FOLATE, FERRITIN, TIBC, IRON, RETICCTPCT in the last 72 hours. Urinalysis    Component Value Date/Time   COLORURINE AMBER (A) 02/10/2020 1800   APPEARANCEUR CLEAR 02/10/2020 1800   LABSPEC 1.025 02/10/2020 1800   PHURINE 5.0 02/10/2020 1800   GLUCOSEU NEGATIVE 02/10/2020 1800   HGBUR NEGATIVE 02/10/2020 1800   BILIRUBINUR NEGATIVE 02/10/2020 1800   BILIRUBINUR negative 02/10/2020 1608   KETONESUR NEGATIVE 02/10/2020 1800   KETONESUR negative 02/10/2020 1608   PROTEINUR 30 (A) 02/10/2020 1800   PROTEINUR >=300 (A) 02/10/2020 1608   UROBILINOGEN 1.0 02/10/2020 1608   UROBILINOGEN 0.2 02/13/2012 0221   NITRITE NEGATIVE 02/10/2020 1800   NITRITE Negative 02/10/2020 1608   LEUKOCYTESUR NEGATIVE 02/10/2020 1800   LEUKOCYTESUR Trace (A) 02/10/2020 1608   Sepsis Labs Invalid input(s): PROCALCITONIN,  WBC,  LACTICIDVEN Microbiology Recent Results (from the past 240 hour(s))  Urine Culture     Status: None   Collection Time: 02/10/20  6:00 PM   Specimen: Urine, Random  Result Value Ref Range Status   Specimen Description   Final    URINE, RANDOM Performed at [facility]  [place] [address], [place], [place] [address]    Special Requests   Final    NONE Performed at [facility], [place] [address], [place], [place] [address]    Culture   Final    NO GROWTH Performed at [facility], [place] [address], [place], [place] [address]    Report Status 02/11/2020 FINAL  Final  Respiratory Panel by RT PCR (Flu A&B, Covid) - Nasopharyngeal Swab     Status: None   Collection Time: 02/10/20  6:00 PM   Specimen: Nasopharyngeal Swab  Result Value Ref Range Status   SARS Coronavirus 2 by RT PCR NEGATIVE NEGATIVE Final    Comment: (NOTE) SARS-CoV-2 target nucleic acids are NOT DETECTED.  The SARS-CoV-2 RNA is generally detectable in upper  respiratoy specimens during the acute phase of infection. The lowest concentration of SARS-CoV-2 viral copies this assay can detect is 131 copies/mL. A negative result does not preclude SARS-Cov-2 infection and should not be used as the sole basis for treatment or other patient management decisions. A negative result may occur with  improper specimen collection/handling, submission of specimen other than nasopharyngeal swab, presence of viral mutation(s) within the areas targeted by this assay, and inadequate number of viral copies (<131 copies/mL). A negative result must be combined with clinical observations, patient history, and epidemiological information. The expected result is Negative.  Fact Sheet for Patients:  PinkCheek.be  Fact Sheet for Healthcare Providers:  GravelBags.it  This test is no t yet approved or cleared by the [place] FDA and  has been authorized for detection and/or diagnosis of SARS-CoV-2 by FDA under an Emergency Use Authorization (EUA). This EUA will remain  in effect (meaning this test can be used) for the duration of the COVID-19 declaration under Section 564(b)(1) of the Act, 21 U.S.C. section 360bbb-3(b)(1), unless the authorization is terminated or revoked sooner.     Influenza A by PCR NEGATIVE NEGATIVE Final   Influenza B by PCR NEGATIVE NEGATIVE Final    Comment: (NOTE) The Xpert Xpress SARS-CoV-2/FLU/RSV assay is intended as an aid in  the diagnosis of influenza from Nasopharyngeal swab specimens and  should not be used as a sole basis for treatment. Nasal washings and  aspirates are unacceptable for Xpert Xpress SARS-CoV-2/FLU/RSV  testing.  Fact Sheet for Patients: PinkCheek.be  Fact Sheet for Healthcare Providers: GravelBags.it  This test is not yet approved or cleared by the [place] FDA and  has been  authorized for detection and/or diagnosis of SARS-CoV-2 by  FDA under an Emergency Use Authorization (EUA). This EUA will remain  in effect (meaning this test can be used) for the duration of the  Covid-19 declaration under Section 564(b)(1) of the Act, 21  U.S.C. section 360bbb-3(b)(1), unless the authorization is  terminated or revoked. Performed at [facility], [place] [address] [place], [place] [address]   SARS Coronavirus 2 by RT PCR (hospital order, performed in [facility] hospital lab) Nasopharyngeal Nasopharyngeal Swab     Status: None   Collection Time: 02/17/20  9:28 AM   Specimen: Nasopharyngeal Swab  Result Value Ref Range Status   SARS Coronavirus 2 NEGATIVE NEGATIVE Final    Comment: (NOTE) SARS-CoV-2 target nucleic acids are NOT DETECTED.  The SARS-CoV-2 RNA is generally detectable in upper and lower respiratory specimens during the acute phase of infection. The lowest concentration of SARS-CoV-2 viral copies this assay can detect is 250 copies / mL. A negative result  does not preclude SARS-CoV-2 infection and should not be used as the sole basis for treatment or other patient management decisions.  A negative result may occur with improper specimen collection / handling, submission of specimen other than nasopharyngeal swab, presence of viral mutation(s) within the areas targeted by this assay, and inadequate number of viral copies (<250 copies / mL). A negative result must be combined with clinical observations, patient history, and epidemiological information.  Fact Sheet for Patients:   StrictlyIdeas.no  Fact Sheet for Healthcare Providers: BankingDealers.co.za  This test is not yet approved or  cleared by the [place] FDA and has been authorized for detection and/or diagnosis of SARS-CoV-2 by FDA under an Emergency Use Authorization (EUA).  This EUA will remain in effect (meaning this test  can be used) for the duration of the COVID-19 declaration under Section 564(b)(1) of the Act, 21 U.S.C. section 360bbb-3(b)(1), unless the authorization is terminated or revoked sooner.  Performed at [facility], [place] [address], [place], [place] [address]      Time coordinating discharge: 35 minutes  SIGNED:   Aline August, MD  [facility] 02/18/2020, 10:51 AM

## 2020-02-18 NOTE — TOC Progression Note (Addendum)
Transition of Care [facility]) - Progression Note    Patient Details  Name: [name] MRN: [id number] Date of Birth: Mar 18, 1921  [facility]) CM/SW Contact  Chabeli Barsamian, Juliann Pulse, RN Phone Number: 02/18/2020, 9:15 AM  Clinical Narrative:Per HTA peer to peer needed-02 levels a concern-Dr. Lynder Parents tel#[phone]-VPCHEKBT CYEL for SNF/PTAR.-[address]  10:55a-per attending P2P approved-CM TC to HTA-await auth# from HTA-for SNF/PTAR. 11:41a-HTA only approved SNF YHTM#[id number]. Not approved for PTAR-denial to be faxed to [phone] will deliver to patient's family-CM spoke to dtr in law [name]-aware of denial-will not appeal-will transport patient to [facility]-home 02 to be ordered once 02 sats documented, & qualifies. MD/Nsg aware. [facility] aware of awating info for delivery of home 02 to rm prior d/c.    Expected Discharge Plan: Skilled Nursing Facility Barriers to Discharge: Insurance Authorization  Expected Discharge Plan and Services Expected Discharge Plan: [facility]   Discharge Planning Services: CM Consult Post Acute Care Choice: [place] arrangements for the past 2 months: Single Family Home                                       Social Determinants of Health (SDOH) Interventions    Readmission Risk Interventions No flowsheet data found.

## 2020-02-18 NOTE — Plan of Care (Signed)

## 2020-02-20 DIAGNOSIS — I5033 Acute on chronic diastolic (congestive) heart failure: Secondary | ICD-10-CM | POA: Diagnosis not present

## 2020-02-20 DIAGNOSIS — I119 Hypertensive heart disease without heart failure: Secondary | ICD-10-CM | POA: Diagnosis not present

## 2020-02-20 DIAGNOSIS — J9600 Acute respiratory failure, unspecified whether with hypoxia or hypercapnia: Secondary | ICD-10-CM | POA: Diagnosis not present

## 2020-02-20 DIAGNOSIS — R6889 Other general symptoms and signs: Secondary | ICD-10-CM | POA: Diagnosis not present

## 2020-02-20 DIAGNOSIS — R911 Solitary pulmonary nodule: Secondary | ICD-10-CM | POA: Diagnosis not present

## 2020-02-20 DIAGNOSIS — I10 Essential (primary) hypertension: Secondary | ICD-10-CM | POA: Diagnosis not present

## 2020-02-20 DIAGNOSIS — J984 Other disorders of lung: Secondary | ICD-10-CM | POA: Diagnosis not present

## 2020-02-20 DIAGNOSIS — I4891 Unspecified atrial fibrillation: Secondary | ICD-10-CM | POA: Diagnosis not present

## 2020-02-20 DIAGNOSIS — I5032 Chronic diastolic (congestive) heart failure: Secondary | ICD-10-CM | POA: Diagnosis not present

## 2020-02-20 DIAGNOSIS — J9601 Acute respiratory failure with hypoxia: Secondary | ICD-10-CM | POA: Diagnosis not present

## 2020-02-20 DIAGNOSIS — I2510 Atherosclerotic heart disease of native coronary artery without angina pectoris: Secondary | ICD-10-CM | POA: Diagnosis not present

## 2020-02-20 DIAGNOSIS — R2681 Unsteadiness on feet: Secondary | ICD-10-CM | POA: Diagnosis not present

## 2020-02-20 DIAGNOSIS — N183 Chronic kidney disease, stage 3 unspecified: Secondary | ICD-10-CM | POA: Diagnosis not present

## 2020-02-20 DIAGNOSIS — E039 Hypothyroidism, unspecified: Secondary | ICD-10-CM | POA: Diagnosis not present

## 2020-02-20 DIAGNOSIS — N390 Urinary tract infection, site not specified: Secondary | ICD-10-CM | POA: Diagnosis not present

## 2020-02-20 DIAGNOSIS — D539 Nutritional anemia, unspecified: Secondary | ICD-10-CM | POA: Diagnosis not present

## 2020-02-20 DIAGNOSIS — I480 Paroxysmal atrial fibrillation: Secondary | ICD-10-CM | POA: Diagnosis not present

## 2020-02-20 DIAGNOSIS — M6281 Muscle weakness (generalized): Secondary | ICD-10-CM | POA: Diagnosis not present

## 2020-02-20 DIAGNOSIS — I2699 Other pulmonary embolism without acute cor pulmonale: Secondary | ICD-10-CM | POA: Diagnosis not present

## 2020-02-24 DIAGNOSIS — I5032 Chronic diastolic (congestive) heart failure: Secondary | ICD-10-CM | POA: Diagnosis not present

## 2020-02-24 DIAGNOSIS — E039 Hypothyroidism, unspecified: Secondary | ICD-10-CM | POA: Diagnosis not present

## 2020-02-24 DIAGNOSIS — J9600 Acute respiratory failure, unspecified whether with hypoxia or hypercapnia: Secondary | ICD-10-CM | POA: Diagnosis not present

## 2020-02-24 DIAGNOSIS — I2699 Other pulmonary embolism without acute cor pulmonale: Secondary | ICD-10-CM | POA: Diagnosis not present

## 2020-02-24 DIAGNOSIS — I10 Essential (primary) hypertension: Secondary | ICD-10-CM | POA: Diagnosis not present

## 2020-02-24 DIAGNOSIS — J984 Other disorders of lung: Secondary | ICD-10-CM | POA: Diagnosis not present

## 2020-02-24 DIAGNOSIS — I2510 Atherosclerotic heart disease of native coronary artery without angina pectoris: Secondary | ICD-10-CM | POA: Diagnosis not present

## 2020-02-24 DIAGNOSIS — R2681 Unsteadiness on feet: Secondary | ICD-10-CM | POA: Diagnosis not present

## 2020-02-24 DIAGNOSIS — I4891 Unspecified atrial fibrillation: Secondary | ICD-10-CM | POA: Diagnosis not present

## 2020-02-24 DIAGNOSIS — N183 Chronic kidney disease, stage 3 unspecified: Secondary | ICD-10-CM | POA: Diagnosis not present

## 2020-02-24 DIAGNOSIS — M6281 Muscle weakness (generalized): Secondary | ICD-10-CM | POA: Diagnosis not present

## 2020-02-26 DIAGNOSIS — E039 Hypothyroidism, unspecified: Secondary | ICD-10-CM | POA: Diagnosis not present

## 2020-02-26 DIAGNOSIS — R2681 Unsteadiness on feet: Secondary | ICD-10-CM | POA: Diagnosis not present

## 2020-02-26 DIAGNOSIS — I2699 Other pulmonary embolism without acute cor pulmonale: Secondary | ICD-10-CM | POA: Diagnosis not present

## 2020-02-26 DIAGNOSIS — J984 Other disorders of lung: Secondary | ICD-10-CM | POA: Diagnosis not present

## 2020-02-26 DIAGNOSIS — I10 Essential (primary) hypertension: Secondary | ICD-10-CM | POA: Diagnosis not present

## 2020-02-26 DIAGNOSIS — N183 Chronic kidney disease, stage 3 unspecified: Secondary | ICD-10-CM | POA: Diagnosis not present

## 2020-02-26 DIAGNOSIS — J9600 Acute respiratory failure, unspecified whether with hypoxia or hypercapnia: Secondary | ICD-10-CM | POA: Diagnosis not present

## 2020-02-26 DIAGNOSIS — I4891 Unspecified atrial fibrillation: Secondary | ICD-10-CM | POA: Diagnosis not present

## 2020-02-26 DIAGNOSIS — M6281 Muscle weakness (generalized): Secondary | ICD-10-CM | POA: Diagnosis not present

## 2020-02-26 DIAGNOSIS — I2510 Atherosclerotic heart disease of native coronary artery without angina pectoris: Secondary | ICD-10-CM | POA: Diagnosis not present

## 2020-02-26 DIAGNOSIS — I5032 Chronic diastolic (congestive) heart failure: Secondary | ICD-10-CM | POA: Diagnosis not present

## 2020-02-27 NOTE — Progress Notes (Deleted)
HPI: Fu CAD. She has a h/o severe HTN and hx of PCI to the pLAD in 4/09 with a Palmaz DES and Promus DES to [place] in 11/11. LHC 02/23/12 performed for chest pain and dyspnea: pLAD 20%, mLAD stent patent, dLAD 60%, oD1 70% (small), small CFX with one AV groove OM1 branch with mid diffuse 80%-no change from 2011, oRCA 40%, dRCA stent patent, EF 75% - medical Rx recommended. Nuclear study November 2017 showed ejection fraction 82% with no ischemia or infarction.  Admitted with pulmonary embolus and atrial fibrillation November 2021.  Echocardiogram November 2021 showed normal LV function, mild left ventricular hypertrophy, moderate mitral regurgitation.  Venous Dopplers November 2021 showed no DVT.  CTA November 2021 showed pulmonary embolus, small bilateral pleural effusions and right upper lobe nodule with follow-up recommended in 6 to 12 months.  Since last seen,  Current Outpatient Medications  Medication Sig Dispense Refill  . acetaminophen (TYLENOL) 500 MG tablet Take 500 mg by mouth every 6 (six) hours as needed for moderate pain.    Marland Kitchen apixaban (ELIQUIS) 5 MG TABS tablet Take 1 tablet (5 mg total) by mouth 2 (two) times daily. 60 tablet 0  . atorvastatin (LIPITOR) 10 MG tablet Take 1 tablet (10 mg total) by mouth daily. 30 tablet 0  . diltiazem (CARDIZEM) 30 MG tablet Take 1 tablet (30 mg total) by mouth 2 (two) times daily. 60 tablet 0  . hydrOXYzine (ATARAX/VISTARIL) 25 MG tablet Take 25 mg by mouth at bedtime as needed for anxiety.     Marland Kitchen levothyroxine (SYNTHROID, LEVOTHROID) 100 MCG tablet Take 1 tablet by mouth daily.    . metoprolol tartrate (LOPRESSOR) 50 MG tablet Take 2 tablets (100 mg total) by mouth 2 (two) times daily. TAKE 1 TABLET BY MOUTH 2 TIMES DAILY.    . Multiple Vitamin (MULTIVITAMIN) tablet Take 1 tablet by mouth every evening.     Marland Kitchen MYRBETRIQ 50 MG TB24 tablet Take 50 mg by mouth every evening.     . nitroGLYCERIN (NITROSTAT) 0.4 MG SL tablet Place 0.4 mg under the  tongue every 5 (five) minutes as needed. For chest pain    . trimethoprim (TRIMPEX) 100 MG tablet Take 100 mg by mouth daily.    . vitamin B-12 (CYANOCOBALAMIN) 500 MCG tablet Take 500 mcg by mouth daily.     No current facility-administered medications for this visit.     Past Medical History:  Diagnosis Date  . CAD (coronary artery disease)    a. 07/2007 PCI/Promus DES to LAD;  b. 02/2010 PCI/Promus DES to dRCA.;  c. 12/2010 Lexi MV: EF 88%, no isch/infarct.;  d. 02/2012 Cath: LM nl, LAD 20p, patent mid stent, 60d, D1 sm 70ost, D2 sm, diff, LCX sm , OM1 diff 45m (no change), RCA 40ost, patent stent, PD/PL nl, EF 75% w/o rwma's.  . Carotid bruit   . Diverticulosis   . GERD (gastroesophageal reflux disease)   . Hemorrhoids   . History of echocardiogram    a. 06/2012 Echo: EF nl, mild LVH.  Marland Kitchen HLD (hyperlipidemia)   . HTN (hypertension)   . Hypothyroidism   . IBS (irritable bowel syndrome)     Past Surgical History:  Procedure Laterality Date  . BACK SURGERY    . CORONARY ANGIOPLASTY WITH STENT PLACEMENT    . hysterectomy - unknown type    . LEFT HEART CATHETERIZATION WITH CORONARY ANGIOGRAM N/A 02/13/2012   Procedure: LEFT HEART CATHETERIZATION WITH CORONARY ANGIOGRAM;  Surgeon:  Josue Hector, MD;  Location[facility] LAB;  Service: Cardiovascular;  Laterality: N/A;  . RECTOCELE REPAIR      Social History   Socioeconomic History  . Marital status: Widowed    Spouse name: Not on file  . Number of children: 4  . Years of education: Not on file  . Highest education level: Not on file  Occupational History  . Occupation: retired  Tobacco Use  . Smoking status: Never Smoker  . Smokeless tobacco: Never Used  Vaping Use  . Vaping Use: Never used  Substance and Sexual Activity  . Alcohol use: No  . Drug use: No  . Sexual activity: Not on file  Other Topics Concern  . Not on file  Social History Narrative   Lives in [place].   Social Determinants of Health   Financial  Resource Strain:   . Difficulty of Paying Living Expenses: Not on file  Food Insecurity:   . Worried About Charity fundraiser in the Last Year: Not on file  . Ran Out of Food in the Last Year: Not on file  Transportation Needs:   . Lack of Transportation (Medical): Not on file  . Lack of Transportation (Non-Medical): Not on file  Physical Activity:   . Days of Exercise per Week: Not on file  . Minutes of Exercise per Session: Not on file  Stress:   . Feeling of Stress : Not on file  Social Connections:   . Frequency of Communication with Friends and Family: Not on file  . Frequency of Social Gatherings with Friends and Family: Not on file  . Attends Religious Services: Not on file  . Active Member of Clubs or Organizations: Not on file  . Attends Archivist Meetings: Not on file  . Marital Status: Not on file  Intimate Partner Violence:   . Fear of Current or Ex-Partner: Not on file  . Emotionally Abused: Not on file  . Physically Abused: Not on file  . Sexually Abused: Not on file    Family History  Problem Relation Age of Onset  . Myasthenia gravis Mother   . Hip fracture Father   . Heart disease Other        several uncles with CAD    ROS: no fevers or chills, productive cough, hemoptysis, dysphasia, odynophagia, melena, hematochezia, dysuria, hematuria, rash, seizure activity, orthopnea, PND, pedal edema, claudication. Remaining systems are negative.  Physical Exam: Well-developed well-nourished in no acute distress.  Skin is warm and dry.  HEENT is normal.  Neck is supple.  Chest is clear to auscultation with normal expansion.  Cardiovascular exam is regular rate and rhythm.  Abdominal exam nontender or distended. No masses palpated. Extremities show no edema. neuro grossly intact  ECG- personally reviewed  A/P  1 persistent atrial fibrillation-patient remains in atrial fibrillation.  Continue metoprolol and Cardizem for rate control.  Continue  apixaban.  2 recent pulmonary embolus-continue apixaban 5 mg twice daily.  After 6 months we will decrease to 2.5 mg twice daily.  3 coronary artery disease-no chest pain.  Continue statin.  No aspirin given need for apixaban.  4 pulmonary nodule-plan follow-up CT November 2022.  5 hypertension-blood pressure controlled.  Continue present medications.  6 hyperlipidemia-continue statin.  7 moderate mitral regurgitation-conservative measures given patient's age.  8 chronic diastolic congestive heart failure-she appears to be euvolemic.  Continue Lasix at present dose.  Kirk Ruths, MD

## 2020-02-28 DIAGNOSIS — R5381 Other malaise: Secondary | ICD-10-CM | POA: Diagnosis not present

## 2020-02-28 DIAGNOSIS — I2510 Atherosclerotic heart disease of native coronary artery without angina pectoris: Secondary | ICD-10-CM | POA: Diagnosis not present

## 2020-02-28 DIAGNOSIS — R2681 Unsteadiness on feet: Secondary | ICD-10-CM | POA: Diagnosis not present

## 2020-02-28 DIAGNOSIS — R4189 Other symptoms and signs involving cognitive functions and awareness: Secondary | ICD-10-CM | POA: Diagnosis not present

## 2020-02-28 DIAGNOSIS — I480 Paroxysmal atrial fibrillation: Secondary | ICD-10-CM | POA: Diagnosis not present

## 2020-02-28 DIAGNOSIS — I5032 Chronic diastolic (congestive) heart failure: Secondary | ICD-10-CM | POA: Diagnosis not present

## 2020-02-28 DIAGNOSIS — N183 Chronic kidney disease, stage 3 unspecified: Secondary | ICD-10-CM | POA: Diagnosis not present

## 2020-02-28 DIAGNOSIS — E039 Hypothyroidism, unspecified: Secondary | ICD-10-CM | POA: Diagnosis not present

## 2020-02-28 DIAGNOSIS — I10 Essential (primary) hypertension: Secondary | ICD-10-CM | POA: Diagnosis not present

## 2020-02-28 DIAGNOSIS — I2699 Other pulmonary embolism without acute cor pulmonale: Secondary | ICD-10-CM | POA: Diagnosis not present

## 2020-02-28 DIAGNOSIS — R911 Solitary pulmonary nodule: Secondary | ICD-10-CM | POA: Diagnosis not present

## 2020-02-28 DIAGNOSIS — I4891 Unspecified atrial fibrillation: Secondary | ICD-10-CM | POA: Diagnosis not present

## 2020-02-28 DIAGNOSIS — M6281 Muscle weakness (generalized): Secondary | ICD-10-CM | POA: Diagnosis not present

## 2020-02-28 DIAGNOSIS — I5031 Acute diastolic (congestive) heart failure: Secondary | ICD-10-CM | POA: Diagnosis not present

## 2020-02-28 DIAGNOSIS — J9600 Acute respiratory failure, unspecified whether with hypoxia or hypercapnia: Secondary | ICD-10-CM | POA: Diagnosis not present

## 2020-02-28 DIAGNOSIS — J984 Other disorders of lung: Secondary | ICD-10-CM | POA: Diagnosis not present

## 2020-03-09 DIAGNOSIS — R5381 Other malaise: Secondary | ICD-10-CM | POA: Diagnosis not present

## 2020-03-09 DIAGNOSIS — I2699 Other pulmonary embolism without acute cor pulmonale: Secondary | ICD-10-CM | POA: Diagnosis not present

## 2020-03-09 DIAGNOSIS — J9601 Acute respiratory failure with hypoxia: Secondary | ICD-10-CM | POA: Diagnosis not present

## 2020-03-10 ENCOUNTER — Ambulatory Visit: Payer: PPO | Admitting: Cardiology

## 2020-03-10 DIAGNOSIS — I119 Hypertensive heart disease without heart failure: Secondary | ICD-10-CM | POA: Diagnosis not present

## 2020-03-10 DIAGNOSIS — R110 Nausea: Secondary | ICD-10-CM | POA: Diagnosis not present

## 2020-03-10 DIAGNOSIS — I480 Paroxysmal atrial fibrillation: Secondary | ICD-10-CM | POA: Diagnosis not present

## 2020-03-10 DIAGNOSIS — I2699 Other pulmonary embolism without acute cor pulmonale: Secondary | ICD-10-CM | POA: Diagnosis not present

## 2020-03-10 DIAGNOSIS — I5033 Acute on chronic diastolic (congestive) heart failure: Secondary | ICD-10-CM | POA: Diagnosis not present

## 2020-03-10 DIAGNOSIS — I2510 Atherosclerotic heart disease of native coronary artery without angina pectoris: Secondary | ICD-10-CM | POA: Diagnosis not present

## 2020-03-10 DIAGNOSIS — E039 Hypothyroidism, unspecified: Secondary | ICD-10-CM | POA: Diagnosis not present

## 2020-03-10 DIAGNOSIS — I5032 Chronic diastolic (congestive) heart failure: Secondary | ICD-10-CM | POA: Diagnosis not present

## 2020-03-10 DIAGNOSIS — J9600 Acute respiratory failure, unspecified whether with hypoxia or hypercapnia: Secondary | ICD-10-CM | POA: Diagnosis not present

## 2020-03-10 DIAGNOSIS — J984 Other disorders of lung: Secondary | ICD-10-CM | POA: Diagnosis not present

## 2020-03-10 DIAGNOSIS — D539 Nutritional anemia, unspecified: Secondary | ICD-10-CM | POA: Diagnosis not present

## 2020-03-10 DIAGNOSIS — R2681 Unsteadiness on feet: Secondary | ICD-10-CM | POA: Diagnosis not present

## 2020-03-10 DIAGNOSIS — I4891 Unspecified atrial fibrillation: Secondary | ICD-10-CM | POA: Diagnosis not present

## 2020-03-10 DIAGNOSIS — R4189 Other symptoms and signs involving cognitive functions and awareness: Secondary | ICD-10-CM | POA: Diagnosis not present

## 2020-03-10 DIAGNOSIS — I10 Essential (primary) hypertension: Secondary | ICD-10-CM | POA: Diagnosis not present

## 2020-03-10 DIAGNOSIS — M6281 Muscle weakness (generalized): Secondary | ICD-10-CM | POA: Diagnosis not present

## 2020-03-10 DIAGNOSIS — N183 Chronic kidney disease, stage 3 unspecified: Secondary | ICD-10-CM | POA: Diagnosis not present

## 2020-03-12 DIAGNOSIS — I2510 Atherosclerotic heart disease of native coronary artery without angina pectoris: Secondary | ICD-10-CM | POA: Diagnosis not present

## 2020-03-12 DIAGNOSIS — J9600 Acute respiratory failure, unspecified whether with hypoxia or hypercapnia: Secondary | ICD-10-CM | POA: Diagnosis not present

## 2020-03-12 DIAGNOSIS — I2699 Other pulmonary embolism without acute cor pulmonale: Secondary | ICD-10-CM | POA: Diagnosis not present

## 2020-03-12 DIAGNOSIS — E039 Hypothyroidism, unspecified: Secondary | ICD-10-CM | POA: Diagnosis not present

## 2020-03-12 DIAGNOSIS — I5032 Chronic diastolic (congestive) heart failure: Secondary | ICD-10-CM | POA: Diagnosis not present

## 2020-03-12 DIAGNOSIS — I10 Essential (primary) hypertension: Secondary | ICD-10-CM | POA: Diagnosis not present

## 2020-03-12 DIAGNOSIS — R2681 Unsteadiness on feet: Secondary | ICD-10-CM | POA: Diagnosis not present

## 2020-03-12 DIAGNOSIS — J984 Other disorders of lung: Secondary | ICD-10-CM | POA: Diagnosis not present

## 2020-03-12 DIAGNOSIS — I4891 Unspecified atrial fibrillation: Secondary | ICD-10-CM | POA: Diagnosis not present

## 2020-03-12 DIAGNOSIS — N183 Chronic kidney disease, stage 3 unspecified: Secondary | ICD-10-CM | POA: Diagnosis not present

## 2020-03-12 DIAGNOSIS — M6281 Muscle weakness (generalized): Secondary | ICD-10-CM | POA: Diagnosis not present

## 2020-03-17 DIAGNOSIS — R498 Other voice and resonance disorders: Secondary | ICD-10-CM | POA: Diagnosis not present

## 2020-03-17 DIAGNOSIS — R2689 Other abnormalities of gait and mobility: Secondary | ICD-10-CM | POA: Diagnosis not present

## 2020-03-17 DIAGNOSIS — I2699 Other pulmonary embolism without acute cor pulmonale: Secondary | ICD-10-CM | POA: Diagnosis not present

## 2020-03-17 DIAGNOSIS — R41841 Cognitive communication deficit: Secondary | ICD-10-CM | POA: Diagnosis not present

## 2020-03-17 DIAGNOSIS — R1312 Dysphagia, oropharyngeal phase: Secondary | ICD-10-CM | POA: Diagnosis not present

## 2020-03-17 DIAGNOSIS — M6281 Muscle weakness (generalized): Secondary | ICD-10-CM | POA: Diagnosis not present

## 2020-03-19 DIAGNOSIS — N183 Chronic kidney disease, stage 3 unspecified: Secondary | ICD-10-CM | POA: Diagnosis not present

## 2020-03-19 DIAGNOSIS — I2699 Other pulmonary embolism without acute cor pulmonale: Secondary | ICD-10-CM | POA: Diagnosis not present

## 2020-03-19 DIAGNOSIS — E038 Other specified hypothyroidism: Secondary | ICD-10-CM | POA: Diagnosis not present

## 2020-03-19 DIAGNOSIS — I480 Paroxysmal atrial fibrillation: Secondary | ICD-10-CM | POA: Diagnosis not present

## 2020-03-19 DIAGNOSIS — D539 Nutritional anemia, unspecified: Secondary | ICD-10-CM | POA: Diagnosis not present

## 2020-03-19 DIAGNOSIS — I5031 Acute diastolic (congestive) heart failure: Secondary | ICD-10-CM | POA: Diagnosis not present

## 2020-04-06 ENCOUNTER — Ambulatory Visit: Payer: PPO | Admitting: Neurology

## 2020-04-09 DIAGNOSIS — Z03818 Encounter for observation for suspected exposure to other biological agents ruled out: Secondary | ICD-10-CM | POA: Diagnosis not present

## 2020-04-23 DIAGNOSIS — Z03818 Encounter for observation for suspected exposure to other biological agents ruled out: Secondary | ICD-10-CM | POA: Diagnosis not present

## 2020-04-28 DIAGNOSIS — Z03818 Encounter for observation for suspected exposure to other biological agents ruled out: Secondary | ICD-10-CM | POA: Diagnosis not present

## 2020-04-29 DIAGNOSIS — L84 Corns and callosities: Secondary | ICD-10-CM | POA: Diagnosis not present

## 2020-04-29 DIAGNOSIS — L603 Nail dystrophy: Secondary | ICD-10-CM | POA: Diagnosis not present

## 2020-04-29 DIAGNOSIS — I739 Peripheral vascular disease, unspecified: Secondary | ICD-10-CM | POA: Diagnosis not present

## 2020-05-05 ENCOUNTER — Other Ambulatory Visit: Payer: Self-pay | Admitting: Cardiology

## 2020-05-13 NOTE — Progress Notes (Deleted)
HPI: Fu CAD. She has a h/o severe HTN and hx of PCI to the pLAD in 4/09 with a [place] DES and Promus DES to [place] in 11/11. LHC 02/23/12 performed for chest pain and dyspnea: pLAD 20%, mLAD stent patent, dLAD 60%, oD1 70% (small), small CFX with one AV groove OM1 branch with mid diffuse 80%-no change from 2011, oRCA 40%, dRCA stent patent, EF 75% - medical Rx recommended. Nuclear study November 2017 showed ejection fraction 82% with no ischemia or infarction.  Admitted November 2021 with acute pulmonary embolus, paroxysmal atrial fibrillation and acute on chronic diastolic congestive heart failure.  CTA November 2021 showed pulmonary embolus, small bilateral pleural effusions and 8 mm right upper lobe nodule and follow-up recommended in 6 to 12 months.  Echo 11/21 showed normal LV function, mild LVH, moderate MR. Venous Dopplers November 2021 showed no DVT.  Since last seen,  Current Outpatient Medications  Medication Sig Dispense Refill  . acetaminophen (TYLENOL) 500 MG tablet Take 500 mg by mouth every 6 (six) hours as needed for moderate pain.    Marland Kitchen apixaban (ELIQUIS) 5 MG TABS tablet Take 1 tablet (5 mg total) by mouth 2 (two) times daily. 60 tablet 0  . atorvastatin (LIPITOR) 10 MG tablet Take 1 tablet (10 mg total) by mouth daily. 30 tablet 0  . diltiazem (CARDIZEM) 30 MG tablet Take 1 tablet (30 mg total) by mouth 2 (two) times daily. 60 tablet 0  . hydrOXYzine (ATARAX/VISTARIL) 25 MG tablet Take 25 mg by mouth at bedtime as needed for anxiety.     Marland Kitchen levothyroxine (SYNTHROID, LEVOTHROID) 100 MCG tablet Take 1 tablet by mouth daily.    . metoprolol tartrate (LOPRESSOR) 50 MG tablet Take 2 tablets (100 mg total) by mouth 2 (two) times daily. TAKE 1 TABLET BY MOUTH 2 TIMES DAILY.    . Multiple Vitamin (MULTIVITAMIN) tablet Take 1 tablet by mouth every evening.     Marland Kitchen MYRBETRIQ 50 MG TB24 tablet Take 50 mg by mouth every evening.     . nitroGLYCERIN (NITROSTAT) 0.4 MG SL tablet Place 0.4 mg  under the tongue every 5 (five) minutes as needed. For chest pain    . trimethoprim (TRIMPEX) 100 MG tablet Take 100 mg by mouth daily.    . vitamin B-12 (CYANOCOBALAMIN) 500 MCG tablet Take 500 mcg by mouth daily.     No current facility-administered medications for this visit.     Past Medical History:  Diagnosis Date  . CAD (coronary artery disease)    a. 07/2007 PCI/Promus DES to LAD;  b. 02/2010 PCI/Promus DES to dRCA.;  c. 12/2010 Lexi MV: EF 88%, no isch/infarct.;  d. 02/2012 Cath: LM nl, LAD 20p, patent mid stent, 60d, D1 sm 70ost, D2 sm, diff, LCX sm , OM1 diff 15m (no change), RCA 40ost, patent stent, PD/PL nl, EF 75% w/o rwma's.  . Carotid bruit   . Diverticulosis   . GERD (gastroesophageal reflux disease)   . Hemorrhoids   . History of echocardiogram    a. 06/2012 Echo: EF nl, mild LVH.  Marland Kitchen HLD (hyperlipidemia)   . HTN (hypertension)   . Hypothyroidism   . IBS (irritable bowel syndrome)     Past Surgical History:  Procedure Laterality Date  . BACK SURGERY    . CORONARY ANGIOPLASTY WITH STENT PLACEMENT    . hysterectomy - unknown type    . LEFT HEART CATHETERIZATION WITH CORONARY ANGIOGRAM N/A 02/13/2012   Procedure: LEFT HEART  CATHETERIZATION WITH CORONARY ANGIOGRAM;  Surgeon: Josue Hector, MD;  Location: [facility] CATH LAB;  Service: Cardiovascular;  Laterality: N/A;  . RECTOCELE REPAIR      Social History   Socioeconomic History  . Marital status: Widowed    Spouse name: Not on file  . Number of children: 4  . Years of education: Not on file  . Highest education level: Not on file  Occupational History  . Occupation: retired  Tobacco Use  . Smoking status: Never Smoker  . Smokeless tobacco: Never Used  Vaping Use  . Vaping Use: Never used  Substance and Sexual Activity  . Alcohol use: No  . Drug use: No  . Sexual activity: Not on file  Other Topics Concern  . Not on file  Social History Narrative   Lives in [place].   Social Determinants of Health    Financial Resource Strain: Not on file  Food Insecurity: Not on file  Transportation Needs: Not on file  Physical Activity: Not on file  Stress: Not on file  Social Connections: Not on file  Intimate Partner Violence: Not on file    Family History  Problem Relation Age of Onset  . Myasthenia gravis Mother   . Hip fracture Father   . Heart disease Other        several uncles with CAD    ROS: no fevers or chills, productive cough, hemoptysis, dysphasia, odynophagia, melena, hematochezia, dysuria, hematuria, rash, seizure activity, orthopnea, PND, pedal edema, claudication. Remaining systems are negative.  Physical Exam: Well-developed well-nourished in no acute distress.  Skin is warm and dry.  HEENT is normal.  Neck is supple.  Chest is clear to auscultation with normal expansion.  Cardiovascular exam is regular rate and rhythm.  Abdominal exam nontender or distended. No masses palpated. Extremities show no edema. neuro grossly intact  ECG- personally reviewed  A/P  1 recent pulmonary embolus-we will continue apixaban to complete 6 months.  2 paroxysmal atrial fibrillation-patient had an episode of atrial fibrillation at time of pulmonary embolus.  This was likely related to the stress of her event and I do not think we will plan long-term anticoagulation.  She remains in sinus rhythm.  3 coronary artery disease-plan to continue statin.  She is not on aspirin given need for apixaban.  4 hypertension-blood pressure controlled.  Continue present medical regimen.  5 hyperlipidemia-continue statin.  Kirk Ruths, MD

## 2020-05-18 DIAGNOSIS — E782 Mixed hyperlipidemia: Secondary | ICD-10-CM | POA: Diagnosis not present

## 2020-05-18 DIAGNOSIS — N3281 Overactive bladder: Secondary | ICD-10-CM | POA: Diagnosis not present

## 2020-05-18 DIAGNOSIS — I4891 Unspecified atrial fibrillation: Secondary | ICD-10-CM | POA: Diagnosis not present

## 2020-05-18 DIAGNOSIS — E039 Hypothyroidism, unspecified: Secondary | ICD-10-CM | POA: Diagnosis not present

## 2020-05-21 DIAGNOSIS — F4321 Adjustment disorder with depressed mood: Secondary | ICD-10-CM | POA: Diagnosis not present

## 2020-05-22 ENCOUNTER — Ambulatory Visit: Payer: PPO | Admitting: Cardiology

## 2020-05-26 ENCOUNTER — Inpatient Hospital Stay (HOSPITAL_COMMUNITY)
Admission: EM | Admit: 2020-05-26 | Discharge: 2020-06-01 | DRG: 309 | Disposition: A | Discharge status: Home / Self Care | Payer: PPO | Source: Skilled Nursing Facility | Attending: Internal Medicine | Admitting: Internal Medicine

## 2020-05-26 ENCOUNTER — Inpatient Hospital Stay (HOSPITAL_COMMUNITY): Payer: PPO

## 2020-05-26 ENCOUNTER — Emergency Department (HOSPITAL_COMMUNITY): Payer: PPO

## 2020-05-26 ENCOUNTER — Other Ambulatory Visit: Payer: Self-pay

## 2020-05-26 ENCOUNTER — Encounter (HOSPITAL_COMMUNITY): Payer: Self-pay

## 2020-05-26 DIAGNOSIS — I2510 Atherosclerotic heart disease of native coronary artery without angina pectoris: Secondary | ICD-10-CM | POA: Diagnosis not present

## 2020-05-26 DIAGNOSIS — R079 Chest pain, unspecified: Secondary | ICD-10-CM | POA: Diagnosis not present

## 2020-05-26 DIAGNOSIS — I4891 Unspecified atrial fibrillation: Secondary | ICD-10-CM | POA: Diagnosis present

## 2020-05-26 DIAGNOSIS — F0390 Unspecified dementia without behavioral disturbance: Secondary | ICD-10-CM | POA: Diagnosis present

## 2020-05-26 DIAGNOSIS — Z79890 Hormone replacement therapy (postmenopausal): Secondary | ICD-10-CM

## 2020-05-26 DIAGNOSIS — K5790 Diverticulosis of intestine, part unspecified, without perforation or abscess without bleeding: Secondary | ICD-10-CM | POA: Diagnosis not present

## 2020-05-26 DIAGNOSIS — Z20822 Contact with and (suspected) exposure to covid-19: Secondary | ICD-10-CM | POA: Diagnosis not present

## 2020-05-26 DIAGNOSIS — Z7901 Long term (current) use of anticoagulants: Secondary | ICD-10-CM

## 2020-05-26 DIAGNOSIS — Z79899 Other long term (current) drug therapy: Secondary | ICD-10-CM | POA: Diagnosis not present

## 2020-05-26 DIAGNOSIS — R410 Disorientation, unspecified: Secondary | ICD-10-CM | POA: Diagnosis not present

## 2020-05-26 DIAGNOSIS — I110 Hypertensive heart disease with heart failure: Secondary | ICD-10-CM | POA: Diagnosis not present

## 2020-05-26 DIAGNOSIS — Z86711 Personal history of pulmonary embolism: Secondary | ICD-10-CM | POA: Diagnosis not present

## 2020-05-26 DIAGNOSIS — R52 Pain, unspecified: Secondary | ICD-10-CM | POA: Diagnosis not present

## 2020-05-26 DIAGNOSIS — N281 Cyst of kidney, acquired: Secondary | ICD-10-CM | POA: Diagnosis not present

## 2020-05-26 DIAGNOSIS — I499 Cardiac arrhythmia, unspecified: Secondary | ICD-10-CM | POA: Diagnosis not present

## 2020-05-26 DIAGNOSIS — Z515 Encounter for palliative care: Secondary | ICD-10-CM | POA: Diagnosis not present

## 2020-05-26 DIAGNOSIS — I4821 Permanent atrial fibrillation: Principal | ICD-10-CM | POA: Diagnosis present

## 2020-05-26 DIAGNOSIS — Z7189 Other specified counseling: Secondary | ICD-10-CM | POA: Diagnosis not present

## 2020-05-26 DIAGNOSIS — J9811 Atelectasis: Secondary | ICD-10-CM | POA: Diagnosis not present

## 2020-05-26 DIAGNOSIS — D696 Thrombocytopenia, unspecified: Secondary | ICD-10-CM | POA: Diagnosis present

## 2020-05-26 DIAGNOSIS — R4182 Altered mental status, unspecified: Secondary | ICD-10-CM | POA: Diagnosis not present

## 2020-05-26 DIAGNOSIS — I959 Hypotension, unspecified: Secondary | ICD-10-CM | POA: Diagnosis present

## 2020-05-26 DIAGNOSIS — Z66 Do not resuscitate: Secondary | ICD-10-CM | POA: Diagnosis present

## 2020-05-26 DIAGNOSIS — I10 Essential (primary) hypertension: Secondary | ICD-10-CM | POA: Diagnosis not present

## 2020-05-26 DIAGNOSIS — I25119 Atherosclerotic heart disease of native coronary artery with unspecified angina pectoris: Secondary | ICD-10-CM | POA: Diagnosis not present

## 2020-05-26 DIAGNOSIS — Z8249 Family history of ischemic heart disease and other diseases of the circulatory system: Secondary | ICD-10-CM

## 2020-05-26 DIAGNOSIS — N261 Atrophy of kidney (terminal): Secondary | ICD-10-CM | POA: Diagnosis not present

## 2020-05-26 DIAGNOSIS — K5730 Diverticulosis of large intestine without perforation or abscess without bleeding: Secondary | ICD-10-CM | POA: Diagnosis not present

## 2020-05-26 DIAGNOSIS — E039 Hypothyroidism, unspecified: Secondary | ICD-10-CM | POA: Diagnosis not present

## 2020-05-26 DIAGNOSIS — D7589 Other specified diseases of blood and blood-forming organs: Secondary | ICD-10-CM | POA: Diagnosis present

## 2020-05-26 DIAGNOSIS — R54 Age-related physical debility: Secondary | ICD-10-CM | POA: Diagnosis present

## 2020-05-26 DIAGNOSIS — K589 Irritable bowel syndrome without diarrhea: Secondary | ICD-10-CM | POA: Diagnosis present

## 2020-05-26 DIAGNOSIS — R0602 Shortness of breath: Secondary | ICD-10-CM | POA: Diagnosis not present

## 2020-05-26 DIAGNOSIS — I5032 Chronic diastolic (congestive) heart failure: Secondary | ICD-10-CM | POA: Diagnosis not present

## 2020-05-26 DIAGNOSIS — I6782 Cerebral ischemia: Secondary | ICD-10-CM | POA: Diagnosis not present

## 2020-05-26 DIAGNOSIS — R0600 Dyspnea, unspecified: Secondary | ICD-10-CM

## 2020-05-26 DIAGNOSIS — Z955 Presence of coronary angioplasty implant and graft: Secondary | ICD-10-CM | POA: Diagnosis not present

## 2020-05-26 DIAGNOSIS — R0902 Hypoxemia: Secondary | ICD-10-CM | POA: Diagnosis not present

## 2020-05-26 DIAGNOSIS — R531 Weakness: Secondary | ICD-10-CM | POA: Diagnosis not present

## 2020-05-26 DIAGNOSIS — R627 Adult failure to thrive: Secondary | ICD-10-CM | POA: Diagnosis present

## 2020-05-26 DIAGNOSIS — E785 Hyperlipidemia, unspecified: Secondary | ICD-10-CM | POA: Diagnosis not present

## 2020-05-26 DIAGNOSIS — K7689 Other specified diseases of liver: Secondary | ICD-10-CM | POA: Diagnosis not present

## 2020-05-26 DIAGNOSIS — K219 Gastro-esophageal reflux disease without esophagitis: Secondary | ICD-10-CM | POA: Diagnosis not present

## 2020-05-26 DIAGNOSIS — M2550 Pain in unspecified joint: Secondary | ICD-10-CM | POA: Diagnosis not present

## 2020-05-26 DIAGNOSIS — Z7401 Bed confinement status: Secondary | ICD-10-CM | POA: Diagnosis not present

## 2020-05-26 DIAGNOSIS — E860 Dehydration: Secondary | ICD-10-CM

## 2020-05-26 DIAGNOSIS — G319 Degenerative disease of nervous system, unspecified: Secondary | ICD-10-CM | POA: Diagnosis not present

## 2020-05-26 DIAGNOSIS — J90 Pleural effusion, not elsewhere classified: Secondary | ICD-10-CM | POA: Diagnosis not present

## 2020-05-26 DIAGNOSIS — I6389 Other cerebral infarction: Secondary | ICD-10-CM | POA: Diagnosis not present

## 2020-05-26 DIAGNOSIS — J0120 Acute ethmoidal sinusitis, unspecified: Secondary | ICD-10-CM | POA: Diagnosis not present

## 2020-05-26 DIAGNOSIS — R404 Transient alteration of awareness: Secondary | ICD-10-CM | POA: Diagnosis not present

## 2020-05-26 DIAGNOSIS — R000 Tachycardia, unspecified: Secondary | ICD-10-CM | POA: Diagnosis not present

## 2020-05-26 LAB — COMPREHENSIVE METABOLIC PANEL
ALT: 15 U/L (ref 0–44)
AST: 29 U/L (ref 15–41)
Albumin: 2.8 g/dL — ABNORMAL LOW (ref 3.5–5.0)
Alkaline Phosphatase: 45 U/L (ref 38–126)
Anion gap: 9 (ref 5–15)
BUN: 11 mg/dL (ref 8–23)
CO2: 25 mmol/L (ref 22–32)
Calcium: 8.6 mg/dL — ABNORMAL LOW (ref 8.9–10.3)
Chloride: 107 mmol/L (ref 98–111)
Creatinine, Ser: 0.94 mg/dL (ref 0.44–1.00)
GFR, Estimated: 55 mL/min — ABNORMAL LOW (ref >60)
Glucose, Bld: 106 mg/dL — ABNORMAL HIGH (ref 70–99)
Potassium: 4.7 mmol/L (ref 3.5–5.1)
Sodium: 141 mmol/L (ref 135–145)
Total Bilirubin: 1.1 mg/dL (ref 0.3–1.2)
Total Protein: 5.3 g/dL — ABNORMAL LOW (ref 6.5–8.1)

## 2020-05-26 LAB — URINALYSIS, ROUTINE W REFLEX MICROSCOPIC
Bacteria, UA: NONE SEEN
Bilirubin Urine: NEGATIVE
Glucose, UA: NEGATIVE mg/dL
Hgb urine dipstick: NEGATIVE
Ketones, ur: 5 mg/dL — AB
Leukocytes,Ua: NEGATIVE
Nitrite: NEGATIVE
Protein, ur: 30 mg/dL — AB
Specific Gravity, Urine: 1.027 (ref 1.005–1.030)
pH: 5 (ref 5.0–8.0)

## 2020-05-26 LAB — CBC WITH DIFFERENTIAL/PLATELET
Abs Immature Granulocytes: 0.02 10*3/uL (ref 0.00–0.07)
Basophils Absolute: 0 10*3/uL (ref 0.0–0.1)
Basophils Relative: 0 %
Eosinophils Absolute: 0.2 10*3/uL (ref 0.0–0.5)
Eosinophils Relative: 2 %
HCT: 39.1 % (ref 36.0–46.0)
Hemoglobin: 12.4 g/dL (ref 12.0–15.0)
Immature Granulocytes: 0 %
Lymphocytes Relative: 41 %
Lymphs Abs: 2.7 10*3/uL (ref 0.7–4.0)
MCH: 33.7 pg (ref 26.0–34.0)
MCHC: 31.7 g/dL (ref 30.0–36.0)
MCV: 106.3 fL — ABNORMAL HIGH (ref 80.0–100.0)
Monocytes Absolute: 0.7 10*3/uL (ref 0.1–1.0)
Monocytes Relative: 11 %
Neutro Abs: 3 10*3/uL (ref 1.7–7.7)
Neutrophils Relative %: 46 %
Platelets: 156 10*3/uL (ref 150–400)
RBC: 3.68 MIL/uL — ABNORMAL LOW (ref 3.87–5.11)
RDW: 16.9 % — ABNORMAL HIGH (ref 11.5–15.5)
WBC: 6.6 10*3/uL (ref 4.0–10.5)
nRBC: 0 % (ref 0.0–0.2)

## 2020-05-26 LAB — I-STAT CHEM 8, ED
BUN: 13 mg/dL (ref 8–23)
Calcium, Ion: 1.04 mmol/L — ABNORMAL LOW (ref 1.15–1.40)
Chloride: 106 mmol/L (ref 98–111)
Creatinine, Ser: 0.9 mg/dL (ref 0.44–1.00)
Glucose, Bld: 101 mg/dL — ABNORMAL HIGH (ref 70–99)
HCT: 40 % (ref 36.0–46.0)
Hemoglobin: 13.6 g/dL (ref 12.0–15.0)
Potassium: 4.6 mmol/L (ref 3.5–5.1)
Sodium: 141 mmol/L (ref 135–145)
TCO2: 27 mmol/L (ref 22–32)

## 2020-05-26 LAB — GLUCOSE, CAPILLARY: Glucose-Capillary: 90 mg/dL (ref 70–99)

## 2020-05-26 LAB — PROTIME-INR
INR: 1.4 — ABNORMAL HIGH (ref 0.8–1.2)
Prothrombin Time: 16.7 seconds — ABNORMAL HIGH (ref 11.4–15.2)

## 2020-05-26 LAB — RESP PANEL BY RT-PCR (FLU A&B, COVID) ARPGX2
Influenza A by PCR: NEGATIVE
Influenza B by PCR: NEGATIVE
SARS Coronavirus 2 by RT PCR: NEGATIVE

## 2020-05-26 LAB — LACTIC ACID, PLASMA: Lactic Acid, Venous: 1.5 mmol/L (ref 0.5–1.9)

## 2020-05-26 LAB — CBG MONITORING, ED: Glucose-Capillary: 93 mg/dL (ref 70–99)

## 2020-05-26 MED ORDER — TRIMETHOPRIM 100 MG PO TABS
100.0000 mg | ORAL_TABLET | Freq: Every day | ORAL | Status: DC
  Administered 2020-05-27 – 2020-06-01 (×6): 100 mg via ORAL
  Filled 2020-05-26 (×6): qty 1

## 2020-05-26 MED ORDER — ATORVASTATIN CALCIUM 10 MG PO TABS
10.0000 mg | ORAL_TABLET | Freq: Every day | ORAL | Status: DC
  Administered 2020-05-27 – 2020-06-01 (×6): 10 mg via ORAL
  Filled 2020-05-26 (×6): qty 1

## 2020-05-26 MED ORDER — DILTIAZEM LOAD VIA INFUSION
10.0000 mg | Freq: Once | INTRAVENOUS | Status: AC
  Administered 2020-05-26: 10 mg via INTRAVENOUS
  Filled 2020-05-26: qty 10

## 2020-05-26 MED ORDER — METOPROLOL TARTRATE 50 MG PO TABS
100.0000 mg | ORAL_TABLET | Freq: Two times a day (BID) | ORAL | Status: DC
  Administered 2020-05-27 – 2020-06-01 (×10): 100 mg via ORAL
  Filled 2020-05-26 (×11): qty 2

## 2020-05-26 MED ORDER — LEVOTHYROXINE SODIUM 100 MCG PO TABS
100.0000 ug | ORAL_TABLET | Freq: Every day | ORAL | Status: DC
  Administered 2020-05-27 – 2020-06-01 (×6): 100 ug via ORAL
  Filled 2020-05-26 (×6): qty 1

## 2020-05-26 MED ORDER — FUROSEMIDE 20 MG PO TABS
10.0000 mg | ORAL_TABLET | ORAL | Status: DC
Start: 2020-05-29 — End: 2020-05-27

## 2020-05-26 MED ORDER — ACETAMINOPHEN 325 MG PO TABS
650.0000 mg | ORAL_TABLET | ORAL | Status: DC | PRN
  Administered 2020-05-29 (×2): 650 mg via ORAL
  Filled 2020-05-26 (×2): qty 2

## 2020-05-26 MED ORDER — LACTATED RINGERS IV BOLUS
500.0000 mL | Freq: Once | INTRAVENOUS | Status: AC
  Administered 2020-05-26: 500 mL via INTRAVENOUS

## 2020-05-26 MED ORDER — DILTIAZEM HCL 60 MG PO TABS
30.0000 mg | ORAL_TABLET | Freq: Two times a day (BID) | ORAL | Status: DC
  Administered 2020-05-27 – 2020-05-28 (×3): 30 mg via ORAL
  Filled 2020-05-26 (×3): qty 1

## 2020-05-26 MED ORDER — ADULT MULTIVITAMIN W/MINERALS CH
1.0000 | ORAL_TABLET | Freq: Every evening | ORAL | Status: DC
  Administered 2020-05-27 – 2020-05-30 (×4): 1 via ORAL
  Filled 2020-05-26 (×4): qty 1

## 2020-05-26 MED ORDER — SODIUM CHLORIDE 0.9 % IV SOLN: INTRAVENOUS | Status: DC

## 2020-05-26 MED ORDER — HYDROXYZINE HCL 25 MG PO TABS
25.0000 mg | ORAL_TABLET | Freq: Every evening | ORAL | Status: DC | PRN
  Administered 2020-05-27 – 2020-05-31 (×3): 25 mg via ORAL
  Filled 2020-05-26 (×3): qty 1

## 2020-05-26 MED ORDER — VITAMIN B-12 1000 MCG PO TABS: 1000.0000 ug | ORAL_TABLET | Freq: Every day | ORAL | Status: DC

## 2020-05-26 MED ORDER — MIRABEGRON ER 50 MG PO TB24
50.0000 mg | ORAL_TABLET | Freq: Every evening | ORAL | Status: DC
  Administered 2020-05-27 – 2020-05-30 (×4): 50 mg via ORAL
  Filled 2020-05-26 (×6): qty 1

## 2020-05-26 MED ORDER — IOHEXOL 350 MG/ML SOLN
100.0000 mL | Freq: Once | INTRAVENOUS | Status: AC | PRN
  Administered 2020-05-26: 100 mL via INTRAVENOUS

## 2020-05-26 MED ORDER — NITROGLYCERIN 0.4 MG SL SUBL: 0.4000 mg | SUBLINGUAL_TABLET | SUBLINGUAL | Status: DC | PRN

## 2020-05-26 MED ORDER — ACETAMINOPHEN 500 MG PO TABS
500.0000 mg | ORAL_TABLET | Freq: Three times a day (TID) | ORAL | Status: DC
  Administered 2020-05-27 – 2020-05-30 (×9): 500 mg via ORAL
  Filled 2020-05-26 (×10): qty 1

## 2020-05-26 MED ORDER — ONDANSETRON HCL 4 MG/2ML IJ SOLN: 4.0000 mg | Freq: Four times a day (QID) | INTRAMUSCULAR | Status: DC | PRN

## 2020-05-26 MED ORDER — APIXABAN 5 MG PO TABS
5.0000 mg | ORAL_TABLET | Freq: Two times a day (BID) | ORAL | Status: DC
  Administered 2020-05-27 – 2020-06-01 (×11): 5 mg via ORAL
  Filled 2020-05-26 (×12): qty 1

## 2020-05-26 MED ORDER — DILTIAZEM HCL-DEXTROSE 125-5 MG/125ML-% IV SOLN (PREMIX)
5.0000 mg/h | INTRAVENOUS | Status: DC
  Administered 2020-05-26: 5 mg/h via INTRAVENOUS
  Administered 2020-05-27: 10 mg/h via INTRAVENOUS
  Filled 2020-05-26 (×2): qty 125

## 2020-05-26 MED ORDER — VITAMIN B-12 1000 MCG PO TABS
500.0000 ug | ORAL_TABLET | Freq: Every day | ORAL | Status: DC
  Administered 2020-05-27 – 2020-06-01 (×6): 500 ug via ORAL
  Filled 2020-05-26 (×6): qty 1

## 2020-05-26 NOTE — ED Provider Notes (Signed)
[facility] EMERGENCY DEPARTMENT Provider Note   CSN: [id number] Arrival date & time: 05/26/20  1434     History Chief Complaint  Patient presents with  . Weakness  . Flank Pain    [name] is a 87 y.o. female history includes CAD, GERD, hypertension, hyperlipidemia, hypothyroidism, IBS, PE on Eliquis, atrial fibrillation.  Patient arrived via EMS today from [place].  All history obtained by EMS as patient altered.  Patient with generalized weakness and lethargy for 2 weeks per EMS, began complaining of right flank pain today.  Vital signs stable with EMS they gave 400 mL normal saline patient appears more alert upon arrival, no other history available.  Level 5 caveat altered mental status  HPI     Past Medical History:  Diagnosis Date  . CAD (coronary artery disease)    a. 07/2007 PCI/Promus DES to LAD;  b. 02/2010 PCI/Promus DES to dRCA.;  c. 12/2010 Lexi MV: EF 88%, no isch/infarct.;  d. 02/2012 Cath: LM nl, LAD 20p, patent mid stent, 60d, D1 sm 70ost, D2 sm, diff, LCX sm , OM1 diff 35m (no change), RCA 40ost, patent stent, PD/PL nl, EF 75% w/o rwma's.  . Carotid bruit   . Diverticulosis   . GERD (gastroesophageal reflux disease)   . Hemorrhoids   . History of echocardiogram    a. 06/2012 Echo: EF nl, mild LVH.  Marland Kitchen HLD (hyperlipidemia)   . HTN (hypertension)   . Hypothyroidism   . IBS (irritable bowel syndrome)     Patient Active Problem List   Diagnosis Date Noted  . Acute pulmonary embolism ([place]) 02/10/2020  . Chronic diastolic CHF (congestive heart failure) ([place]) 02/10/2020  . New onset atrial fibrillation ([place]) 02/10/2020  . Lung nodule 02/10/2020  . History of echocardiogram   . GERD (gastroesophageal reflux disease)   . Diverticulosis   . Carotid bruit   . IBS (irritable bowel syndrome)   . Hypothyroidism   . HTN (hypertension)   . HLD (hyperlipidemia)   . CAD (coronary artery disease)   . Atypical chest pain  06/14/2012  . Chest pain 02/12/2012  . Fatigue 04/22/2011  . CAROTID BRUIT 03/19/2009  . HYPOTHYROIDISM 08/29/2008  . HEMORRHOIDS 08/29/2008  . GERD 08/29/2008  . DIVERTICULOSIS, COLON 08/29/2008  . HYPERLIPIDEMIA 07/04/2008  . HYPERTENSION, BENIGN 07/04/2008  . CAD, NATIVE VESSEL 07/04/2008  . IRRITABLE BOWEL SYNDROME 07/04/2008    Past Surgical History:  Procedure Laterality Date  . BACK SURGERY    . CORONARY ANGIOPLASTY WITH STENT PLACEMENT    . hysterectomy - unknown type    . LEFT HEART CATHETERIZATION WITH CORONARY ANGIOGRAM N/A 02/13/2012   Procedure: LEFT HEART CATHETERIZATION WITH CORONARY ANGIOGRAM;  Surgeon: Josue Hector, MD;  Location: [facility] CATH LAB;  Service: Cardiovascular;  Laterality: N/A;  . RECTOCELE REPAIR       OB History   No obstetric history on file.     Family History  Problem Relation Age of Onset  . Myasthenia gravis Mother   . Hip fracture Father   . Heart disease Other        several uncles with CAD    Social History   Tobacco Use  . Smoking status: Never Smoker  . Smokeless tobacco: Never Used  Vaping Use  . Vaping Use: Never used  Substance Use Topics  . Alcohol use: No  . Drug use: No    Home Medications Prior to Admission medications   Medication Sig Start  Date End Date Taking? Authorizing Provider  acetaminophen (TYLENOL) 500 MG tablet Take 500 mg by mouth every 6 (six) hours as needed for moderate pain.    [provider]  apixaban (ELIQUIS) 5 MG TABS tablet Take 1 tablet (5 mg total) by mouth 2 (two) times daily. 02/18/20   Aline August, MD  atorvastatin (LIPITOR) 10 MG tablet Take 1 tablet (10 mg total) by mouth daily. 02/19/20   Aline August, MD  diltiazem (CARDIZEM) 30 MG tablet Take 1 tablet (30 mg total) by mouth 2 (two) times daily. 02/18/20   Aline August, MD  hydrOXYzine (ATARAX/VISTARIL) 25 MG tablet Take 25 mg by mouth at bedtime as needed for anxiety.  02/03/20   [provider]  levothyroxine  (SYNTHROID, LEVOTHROID) 100 MCG tablet Take 1 tablet by mouth daily. 12/13/15   [provider]  metoprolol tartrate (LOPRESSOR) 50 MG tablet Take 2 tablets (100 mg total) by mouth 2 (two) times daily. TAKE 1 TABLET BY MOUTH 2 TIMES DAILY. 02/18/20   Aline August, MD  Multiple Vitamin (MULTIVITAMIN) tablet Take 1 tablet by mouth every evening.     [provider]  MYRBETRIQ 50 MG TB24 tablet Take 50 mg by mouth every evening.  10/31/19   [provider]  nitroGLYCERIN (NITROSTAT) 0.4 MG SL tablet Place 0.4 mg under the tongue every 5 (five) minutes as needed. For chest pain    [provider]  trimethoprim (TRIMPEX) 100 MG tablet Take 100 mg by mouth daily. 01/21/20   [provider]  vitamin B-12 (CYANOCOBALAMIN) 500 MCG tablet Take 500 mcg by mouth daily.    [provider]    Allergies    Patient has no known allergies.  Review of Systems   Review of Systems  Unable to perform ROS: Mental status change    Physical Exam Updated Vital Signs BP (!) 127/91   Physical Exam Constitutional:      General: She is not in acute distress.    Appearance: Normal appearance. She is well-developed. She is ill-appearing. She is not toxic-appearing or diaphoretic.  HENT:     Head: Normocephalic and atraumatic.  Eyes:     General: Vision grossly intact. Gaze aligned appropriately.     Pupils: Pupils are equal, round, and reactive to light.  Neck:     Trachea: Trachea and phonation normal.  Cardiovascular:     Rate and Rhythm: Tachycardia present. Rhythm irregularly irregular.  Pulmonary:     Effort: Pulmonary effort is normal. No accessory muscle usage or respiratory distress.  Chest:     Chest wall: No deformity or tenderness.  Abdominal:     General: There is no distension.     Palpations: Abdomen is soft.     Tenderness: There is no abdominal tenderness. There is no guarding or rebound.  Musculoskeletal:        General: Normal range of  motion.     Cervical back: Normal range of motion and neck supple. No spinous process tenderness or muscular tenderness.     Right lower leg: No edema.     Left lower leg: No edema.     Comments: No midline C/T/L spinal tenderness to palpation, no paraspinal muscle tenderness, no deformity, crepitus, or step-off noted. No sign of injury to the neck or back.  Pelvis stable to compression bilateral without pain.  Passive range of motion of bilateral hips and knees intact without pain or deformity.  Passive range of motion of major joints of  bilateral upper extremities intact without pain.  Patient is able to pull herself to a seated position with minimal assistance.  Skin:    General: Skin is warm and dry.  Neurological:     Mental Status: She is alert.     GCS: GCS eye subscore is 4. GCS verbal subscore is 3. GCS motor subscore is 6.     Comments: Speech disordered, follows basic commands with equal strength bilaterally.  No obvious facial droop.  Coordination appears intact with bilateral upper extremities  Psychiatric:        Behavior: Behavior normal.     ED Results / Procedures / Treatments   Labs (all labs ordered are listed, but only abnormal results are displayed) Labs Reviewed  CULTURE, BLOOD (ROUTINE X 2)  CULTURE, BLOOD (ROUTINE X 2)  CBC WITH DIFFERENTIAL/PLATELET  COMPREHENSIVE METABOLIC PANEL  URINALYSIS, ROUTINE W REFLEX MICROSCOPIC  PROTIME-INR  LACTIC ACID, PLASMA  CBG MONITORING, ED  I-STAT CHEM 8, ED    EKG EKG Interpretation  Date/Time:  Tuesday May 26 2020 14:50:27 EST Ventricular Rate:  108 PR Interval:    QRS Duration: 64 QT Interval:  324 QTC Calculation: 435 R Axis:   70 Text Interpretation: Atrial fibrillation Anteroseptal infarct, old Minimal ST depression, lateral leads Since last tracing rate slower Confirmed by Noemi Chapel [phone]) on 05/26/2020 3:12:47 PM   Radiology No results found.  Procedures Procedures   Medications Ordered in  ED Medications - No data to display  ED Course  I have reviewed the triage vital signs and the nursing notes.  Pertinent labs & imaging results that were available during my care of the patient were reviewed by me and considered in my medical decision making (see chart for details).  Clinical Course as of 05/26/20 1516  Tue May 26, 2020  [phone]) [phone] Inda Merlin Tone [BM]  [address], [place] [phone])  [phone] (Work Phone) Wrong number [BM]  [phone] [BM]  [place] [BM]    Clinical Course User Index [BM] Gari Crown   MDM Rules/Calculators/A&P                         Additional history obtained from: 1. Nursing notes from this visit. 2. Review of electronic medical records. 3. Family member, [name]. ------------------- 87 year old female arrived via EMS today for concern of generalized weakness and right flank pain.  Last seen normal over 2 weeks ago per EMS.  Patient noted to be A. fib RVR but no fever or hypotension.  Patient with equal strength bilaterally on exam good range of motion of all major joints no evidence of significant trauma.  She is dysarthric and is unable to provide meaningful history.  I attempted to call patient's nursing facility multiple times but there is only a dial tone available.  I then did try to call patient's son Nicole Kindred but it was sent to a business and was an incorrect number.  I was eventually able to reach Waneta Martins, patient's daughter-in-law.  [name] reports that patient does not have an official diagnosis of dementia but has had increasing confusion since an admission for pulmonary embolism and November 2021, patient has had increasing difficulty remembering family members names and what she is doing for the past few months.  She has not seen the patient in weeks but is not aware of any recent illnesses or falls.  She and her husband are on the way to the hospital to  evaluate the patient.  Her number is  [phone]. - I have ordered broad work-up for altered mental status including EKG, CBC, CBG, CMP, lactate, urinalysis, blood cultures, Covid test, PT/INR and i-STAT Chem-8.  I also ordered CT head for evaluation of altered mental status as well as chest x-ray.  Nursing staff to obtain in and out urine as well as rectal temp. - EKG: Atrial fibrillation Anteroseptal infarct, old Minimal ST depression, lateral leads Since last tracing rate slower Confirmed by Noemi Chapel [phone]) on 05/26/2020 3:12:47 PM  Rectal temperature was within normal limits, patient slightly tachycardic at 106 bpm.  Remainder of work-up is currently pending.  Care handoff given to Dr. Sabra Heck at shift change, final disposition per oncoming team     Note: Portions of this report may have been transcribed using voice recognition software. Every effort was made to ensure accuracy; however, inadvertent computerized transcription errors may still be present. Final Clinical Impression(s) / ED Diagnoses Final diagnoses:  None    Rx / DC Orders ED Discharge Orders    None       Gari Crown 05/26/20 1524    Noemi Chapel, MD 05/26/20 [phone]

## 2020-05-26 NOTE — ED Triage Notes (Signed)
Pt from [facility] with ems c.o generalized weakness for the past 2 weeks, staff also reports pt c.o right sided flank pain today and has been more lethargic. Ems gave 400 ml saline and pt became more alert. VSS, in a fib with hx of same. Pt arrives to ED alert.

## 2020-05-26 NOTE — H&P (Signed)
History and Physical   [name] PJK:[id number] DOB: 1920-05-25 DOA: 05/26/2020  Referring MD/NP/PA: Dr. Noemi Chapel  PCP: [facility], [place]   Outpatient Specialists: Dr. Stanford Breed  Patient coming from: [place], SNF  Chief Complaint: Weakness  HPI: [name] is a 87 y.o. female with medical history significant of coronary artery disease status post PCI, chronic atrial fibrillation, history of pulmonary embolism, GERD and essential hypertension, hyperlipidemia, hypertension, who presents to the ER with weakness confusion and flank pain.  Patient is here with her husband.  Patient is weak debilitated not able to give adequate history.  Complained of palpitations.  She was found to have A. fib with RVR.  She has been on Eliquis.  Things have been going on for 2 weeks now.  She was having right flank pain today.Marland Kitchen  She was found to be hypotensive.  Given some fluid boluses on brought to the ER.  In the ER patient was initiated on Cardizem drip and she has been admitted to the hospital with A. fib with RVR..  ED Course: Temperature 98.6 blood pressure 144/96, pulse 154, respirate of 35, oxygen sat 93% room air.  CBC and chemistry largely within normal.  Urinalysis within normal.  COVID-19 negative.  Head CT without contrast shows no acute finding.  CT abdomen pelvis shows bilateral pleural effusion right greater than left with bibasilar atelectasis.  Patient been admitted with A. fib with RVR.  Review of Systems: As per HPI otherwise 10 point review of systems negative.    Past Medical History:  Diagnosis Date  . CAD (coronary artery disease)    a. 07/2007 PCI/Promus DES to LAD;  b. 02/2010 PCI/Promus DES to dRCA.;  c. 12/2010 Lexi MV: EF 88%, no isch/infarct.;  d. 02/2012 Cath: LM nl, LAD 20p, patent mid stent, 60d, D1 sm 70ost, D2 sm, diff, LCX sm , OM1 diff 83m (no change), RCA 40ost, patent stent, PD/PL nl, EF 75% w/o rwma's.  . Carotid bruit   . Diverticulosis   . GERD  (gastroesophageal reflux disease)   . Hemorrhoids   . History of echocardiogram    a. 06/2012 Echo: EF nl, mild LVH.  Marland Kitchen HLD (hyperlipidemia)   . HTN (hypertension)   . Hypothyroidism   . IBS (irritable bowel syndrome)     Past Surgical History:  Procedure Laterality Date  . BACK SURGERY    . CORONARY ANGIOPLASTY WITH STENT PLACEMENT    . hysterectomy - unknown type    . LEFT HEART CATHETERIZATION WITH CORONARY ANGIOGRAM N/A 02/13/2012   Procedure: LEFT HEART CATHETERIZATION WITH CORONARY ANGIOGRAM;  Surgeon: Josue Hector, MD;  Location: [facility] CATH LAB;  Service: Cardiovascular;  Laterality: N/A;  . RECTOCELE REPAIR       reports that she has never smoked. She has never used smokeless tobacco. She reports that she does not drink alcohol and does not use drugs.  No Known Allergies  Family History  Problem Relation Age of Onset  . Myasthenia gravis Mother   . Hip fracture Father   . Heart disease Other        several uncles with CAD     Prior to Admission medications   Medication Sig Start Date End Date Taking? Authorizing Provider  acetaminophen (TYLENOL) 500 MG tablet Take 500 mg by mouth every 8 (eight) hours.   Yes [provider]  apixaban (ELIQUIS) 5 MG TABS tablet Take 1 tablet (5 mg total) by mouth 2 (two) times daily. 02/18/20  Yes Aline August, MD  atorvastatin (LIPITOR) 10 MG tablet Take 1 tablet (10 mg total) by mouth daily. 02/19/20  Yes Aline August, MD  cyanocobalamin 1000 MCG tablet Take 1,000 mcg by mouth daily.   Yes [provider]  diltiazem (CARDIZEM) 30 MG tablet Take 1 tablet (30 mg total) by mouth 2 (two) times daily. 02/18/20  Yes Aline August, MD  furosemide (LASIX) 20 MG tablet Take 10 mg by mouth See admin instructions. Take one tablet by mouth every Friday per [facility]   Yes [provider]  levothyroxine (SYNTHROID, LEVOTHROID) 100 MCG tablet Take 1 tablet by mouth daily. 12/13/15  Yes [provider]  metoprolol  tartrate (LOPRESSOR) 100 MG tablet Take 100 mg by mouth 2 (two) times daily.   Yes [provider]  Multiple Vitamin (MULTIVITAMIN) tablet Take 1 tablet by mouth every evening.    Yes [provider]  MYRBETRIQ 50 MG TB24 tablet Take 50 mg by mouth every evening.  10/31/19  Yes [provider]  nitroGLYCERIN (NITROSTAT) 0.4 MG SL tablet Place 0.4 mg under the tongue every 5 (five) minutes as needed. For chest pain   Yes [provider]  trimethoprim (TRIMPEX) 100 MG tablet Take 100 mg by mouth daily. 01/21/20  Yes [provider]  hydrOXYzine (ATARAX/VISTARIL) 25 MG tablet Take 25 mg by mouth at bedtime as needed for anxiety.  02/03/20   [provider]  vitamin B-12 (CYANOCOBALAMIN) 500 MCG tablet Take 500 mcg by mouth daily.    [provider]    Physical Exam: Vitals:   05/26/20 2030 05/26/20 2045 05/26/20 2100 05/26/20 2115  BP: 115/84 115/85 121/77 114/75  Pulse: 63 (!) 101  82  Resp: 16 18 (!) 21 (!) 28  Temp:      TempSrc:      SpO2: 94% 93% 95% 96%      Constitutional: Frail, chronically ill looking, no distress Vitals:   05/26/20 2030 05/26/20 2045 05/26/20 2100 05/26/20 2115  BP: 115/84 115/85 121/77 114/75  Pulse: 63 (!) 101  82  Resp: 16 18 (!) 21 (!) 28  Temp:      TempSrc:      SpO2: 94% 93% 95% 96%   Eyes: PERRL, lids and conjunctivae normal ENMT: Mucous membranes are moist. Posterior pharynx clear of any exudate or lesions.Normal dentition.  Neck: normal, supple, no masses, no thyromegaly Respiratory: Coarse breath sounds bilaterally, no wheezing, diffuse crackles. Normal respiratory effort. No accessory muscle use.  Cardiovascular: Irregularly irregular with tachycardia no murmurs / rubs / gallops. No extremity edema. 2+ pedal pulses. No carotid bruits.  Abdomen: no tenderness, no masses palpated. No hepatosplenomegaly. Bowel sounds positive.  Musculoskeletal: no clubbing / cyanosis. No joint  deformity upper and lower extremities. Good ROM, no contractures. Normal muscle tone.  Skin: no rashes, lesions, ulcers. No induration Neurologic: CN 2-12 grossly intact. Sensation intact, DTR normal. Strength 5/5 in all 4.  Psychiatric: Confused    Labs on Admission: I have personally reviewed following labs and imaging studies  CBC: Recent Labs  Lab 05/26/20 1456 05/26/20 1506  WBC 6.6  --   NEUTROABS 3.0  --   HGB 12.4 13.6  HCT 39.1 40.0  MCV 106.3*  --   PLT 156  --    Basic Metabolic Panel: Recent Labs  Lab 05/26/20 1456 05/26/20 1506  NA 141 141  K 4.7 4.6  CL 107 106  CO2 25  --   GLUCOSE 106* 101*  BUN 11 13  CREATININE 0.94 0.90  CALCIUM 8.6*  --    GFR: CrCl cannot be calculated (Unknown ideal weight.). Liver Function Tests: Recent Labs  Lab 05/26/20 1456  AST 29  ALT 15  ALKPHOS 45  BILITOT 1.1  PROT 5.3*  ALBUMIN 2.8*   No results for input(s): LIPASE, AMYLASE in the last 168 hours. No results for input(s): AMMONIA in the last 168 hours. Coagulation Profile: Recent Labs  Lab 05/26/20 1456  INR 1.4*   Cardiac Enzymes: No results for input(s): CKTOTAL, CKMB, CKMBINDEX, TROPONINI in the last 168 hours. BNP (last 3 results) No results for input(s): PROBNP in the last 8760 hours. HbA1C: No results for input(s): HGBA1C in the last 72 hours. CBG: Recent Labs  Lab 05/26/20 1454  GLUCAP 93   Lipid Profile: No results for input(s): CHOL, HDL, LDLCALC, TRIG, CHOLHDL, LDLDIRECT in the last 72 hours. Thyroid Function Tests: No results for input(s): TSH, T4TOTAL, FREET4, T3FREE, THYROIDAB in the last 72 hours. Anemia Panel: No results for input(s): VITAMINB12, FOLATE, FERRITIN, TIBC, IRON, RETICCTPCT in the last 72 hours. Urine analysis:    Component Value Date/Time   COLORURINE AMBER (A) 05/26/2020 1710   APPEARANCEUR HAZY (A) 05/26/2020 1710   LABSPEC 1.027 05/26/2020 1710   PHURINE 5.0 05/26/2020 1710   GLUCOSEU NEGATIVE 05/26/2020  1710   HGBUR NEGATIVE 05/26/2020 1710   BILIRUBINUR NEGATIVE 05/26/2020 1710   BILIRUBINUR negative 02/10/2020 1608   KETONESUR 5 (A) 05/26/2020 1710   PROTEINUR 30 (A) 05/26/2020 1710   UROBILINOGEN 1.0 02/10/2020 1608   UROBILINOGEN 0.2 02/13/2012 0221   NITRITE NEGATIVE 05/26/2020 1710   LEUKOCYTESUR NEGATIVE 05/26/2020 1710   Sepsis Labs: @LABRCNTIP (procalcitonin:4,lacticidven:4) ) Recent Results (from the past 240 hour(s))  Resp Panel by RT-PCR (Flu A&B, Covid) Nasopharyngeal Swab     Status: None   Collection Time: 05/26/20  4:14 PM   Specimen: Nasopharyngeal Swab; Nasopharyngeal(NP) swabs in vial transport medium  Result Value Ref Range Status   SARS Coronavirus 2 by RT PCR NEGATIVE NEGATIVE Final    Comment: (NOTE) SARS-CoV-2 target nucleic acids are NOT DETECTED.  The SARS-CoV-2 RNA is generally detectable in upper respiratory specimens during the acute phase of infection. The lowest concentration of SARS-CoV-2 viral copies this assay can detect is 138 copies/mL. A negative result does not preclude SARS-Cov-2 infection and should not be used as the sole basis for treatment or other patient management decisions. A negative result may occur with  improper specimen collection/handling, submission of specimen other than nasopharyngeal swab, presence of viral mutation(s) within the areas targeted by this assay, and inadequate number of viral copies(<138 copies/mL). A negative result must be combined with clinical observations, patient history, and epidemiological information. The expected result is Negative.  Fact Sheet for Patients:  EntrepreneurPulse.com.au  Fact Sheet for Healthcare Providers:  IncredibleEmployment.be  This test is no t yet approved or cleared by the [place] FDA and  has been authorized for detection and/or diagnosis of SARS-CoV-2 by FDA under an Emergency Use Authorization (EUA). This EUA will remain  in  effect (meaning this test can be used) for the duration of the COVID-19 declaration under Section 564(b)(1) of the Act, 21 U.S.C.section 360bbb-3(b)(1), unless the authorization is terminated  or revoked sooner.       Influenza A by PCR NEGATIVE NEGATIVE Final   Influenza B by PCR NEGATIVE NEGATIVE Final    Comment: (NOTE) The Xpert Xpress SARS-CoV-2/FLU/RSV plus assay is intended as an aid in the  diagnosis of influenza from Nasopharyngeal swab specimens and should not be used as a sole basis for treatment. Nasal washings and aspirates are unacceptable for Xpert Xpress SARS-CoV-2/FLU/RSV testing.  Fact Sheet for Patients: EntrepreneurPulse.com.au  Fact Sheet for Healthcare Providers: IncredibleEmployment.be  This test is not yet approved or cleared by the [place] FDA and has been authorized for detection and/or diagnosis of SARS-CoV-2 by FDA under an Emergency Use Authorization (EUA). This EUA will remain in effect (meaning this test can be used) for the duration of the COVID-19 declaration under Section 564(b)(1) of the Act, 21 U.S.C. section 360bbb-3(b)(1), unless the authorization is terminated or revoked.  Performed at [facility], [place] [address], [place],  [address]      Radiological Exams on Admission: CT Head Wo Contrast  Result Date: 05/26/2020 CLINICAL DATA:  Mental status change.  Suspected alcohol/drug use. EXAM: CT HEAD WITHOUT CONTRAST TECHNIQUE: Contiguous axial images were obtained from the base of the skull through the vertex without intravenous contrast. COMPARISON:  CT head 02/10/2020. FINDINGS: Brain: There is no evidence of acute intracranial hemorrhage, mass lesion, brain edema or extra-axial fluid collection. There is stable mild atrophy with prominence of the ventricles and subarachnoid spaces. Chronic small vessel ischemic changes in the periventricular white matter, left basal ganglia and left  thalamus are unchanged. There is no CT evidence of acute cortical infarction. Vascular: Diffuse intracranial vascular calcifications. No hyperdense vessel identified. Skull: Negative for fracture or focal lesion. Sinuses/Orbits: The visualized paranasal sinuses and mastoid air cells are clear. Postsurgical changes in both globes. Other: None. IMPRESSION: 1. No acute intracranial findings. 2. Stable atrophy and chronic small vessel ischemic changes. Electronically Signed   By: Richardean Sale M.D.   On: 05/26/2020 15:42   CT ABDOMEN PELVIS W CONTRAST  Result Date: 05/26/2020 CLINICAL DATA:  Flank pain, calculus EXAM: CT ABDOMEN AND PELVIS WITH CONTRAST TECHNIQUE: Multidetector CT imaging of the abdomen and pelvis was performed using the standard protocol following bolus administration of intravenous contrast. CONTRAST:  122mL OMNIPAQUE IOHEXOL 350 MG/ML SOLN COMPARISON:  02/10/2020 FINDINGS: Lower chest: Bilateral pleural effusions, right greater than left. Patchy areas of consolidation within the bilateral lower lobes compatible with atelectasis. Prominent biatrial dilatation. No pericardial effusion. Hepatobiliary: Stable scarring and calcification superior right lobe liver just beneath the right hemidiaphragm. Heterogeneous liver attenuation likely due to arterial phase of contrast enhancement. Gallbladder is not identified and may be surgically absent. Pancreas: Unremarkable. No pancreatic ductal dilatation or surrounding inflammatory changes. Spleen: Normal in size without focal abnormality. Adrenals/Urinary Tract: Stable bilateral renal cortical atrophy. Simple cyst upper pole left kidney. No urinary tract calculi or obstructive uropathy. No adrenal masses. Bladder is decompressed, limiting its evaluation. Stomach/Bowel: No bowel obstruction or ileus. Diverticulosis of the sigmoid colon without diverticulitis. No bowel wall thickening or inflammatory change. Vascular/Lymphatic: Aortic atherosclerosis. No  enlarged abdominal or pelvic lymph nodes. Reproductive: Status post hysterectomy. No adnexal masses. Other: No free fluid or free gas. No abdominal wall hernia. Musculoskeletal: No acute or destructive bony lesions. Reconstructed images demonstrate no additional findings. IMPRESSION: 1. Bilateral pleural effusions, right greater than left, with bibasilar atelectasis. 2. Sigmoid diverticulosis without diverticulitis. 3. No urinary tract calculi or obstructive uropathy. 4. Aortic Atherosclerosis (ICD10-I70.0). Electronically Signed   By: Randa Ngo M.D.   On: 05/26/2020 19:46   DG Chest Portable 1 View  Result Date: 05/26/2020 CLINICAL DATA:  Weakness, altered mental status, history coronary artery disease, hypertension EXAM: PORTABLE CHEST 1 VIEW COMPARISON:  Portable exam 1502 hours compared to 02/14/2020 FINDINGS: Upper normal size of cardiac silhouette. Mediastinal contours and pulmonary vascularity normal. Atherosclerotic calcification aorta. Bronchitic changes with bibasilar pleural effusions and atelectasis greater on RIGHT. Remaining lungs clear. No acute infiltrate or pneumothorax. Bones demineralized. IMPRESSION: Bronchitic changes with bibasilar pleural effusions and atelectasis, greater on RIGHT. Aortic Atherosclerosis (ICD10-I70.0). Electronically Signed   By: Lavonia Dana M.D.   On: 05/26/2020 15:35    EKG: Independently reviewed.  Atrial fibrillation with rapid response.  Rate of 108.  Minimal ST depressions in the lateral leads.  Mostly unchanged from last EKG.  Assessment/Plan Principal Problem:   Rapid atrial fibrillation (HCC) Active Problems:   HYPERTENSION, BENIGN   GERD   CAD (coronary artery disease)   Hypothyroidism   Chronic diastolic CHF (congestive heart failure) (HCC)     #1 A. fib with RVR: Not sure what triggered this new round of RVR.  Patient already on Eliquis.  Will initiate the Cardizem drip with high Eliquis.  Titrate and resume her oral medications.  May  need to get cardiology consultation.  #2 coronary artery disease: Troponins seem to be stable.  Last echo was in November of last year.  May need to get a repeat echo in the meantime however continue supportive care.  #3 essential hypertension: Continue home regimen.  #4 hypothyroidism: Continue with levothyroxine  #5 GERD: Continue with PPIs  #6 chronic diastolic CHF: Still has pleural effusion with pulmonary edema.  Continue diuresis if blood pressure allows.   DVT prophylaxis: Eliquis Code Status: DNR Family Communication: Husband at bedside Disposition Plan: Home Consults called: None but may consult cardiology in the morning Admission status: Inpatient  Severity of Illness: The appropriate patient status for this patient is INPATIENT. Inpatient status is judged to be reasonable and necessary in order to provide the required intensity of service to ensure the patient's safety. The patient's presenting symptoms, physical exam findings, and initial radiographic and laboratory data in the context of their chronic comorbidities is felt to place them at high risk for further clinical deterioration. Furthermore, it is not anticipated that the patient will be medically stable for discharge from the hospital within 2 midnights of admission. The following factors support the patient status of inpatient.   " The patient's presenting symptoms include shortness of breath and weakness. " The worrisome physical exam findings include A. fib with RVR. " The initial radiographic and laboratory data are worrisome because of evidence of A. fib with RVR. " The chronic co-morbidities include coronary artery disease.   * I certify that at the point of admission it is my clinical judgment that the patient will require inpatient hospital care spanning beyond 2 midnights from the point of admission due to high intensity of service, high risk for further deterioration and high frequency of surveillance  required.Barbette Merino MD [facility] Pager [phone]  If 7PM-7AM, please contact night-coverage [email or web address] Password [facility]  05/26/2020, 9:58 PM

## 2020-05-26 NOTE — ED Notes (Signed)
Got patient into a gown on the monitor did ekg shown to er provider patient is resting with call bell in reach  

## 2020-05-27 ENCOUNTER — Encounter: Payer: Self-pay | Admitting: General Practice

## 2020-05-27 ENCOUNTER — Inpatient Hospital Stay (HOSPITAL_COMMUNITY): Payer: PPO

## 2020-05-27 DIAGNOSIS — I4891 Unspecified atrial fibrillation: Secondary | ICD-10-CM

## 2020-05-27 LAB — GLUCOSE, CAPILLARY
Glucose-Capillary: 87 mg/dL (ref 70–99)
Glucose-Capillary: 89 mg/dL (ref 70–99)
Glucose-Capillary: 88 mg/dL (ref 70–99)
Glucose-Capillary: 83 mg/dL (ref 70–99)
Glucose-Capillary: 120 mg/dL — ABNORMAL HIGH (ref 70–99)
Glucose-Capillary: 102 mg/dL — ABNORMAL HIGH (ref 70–99)

## 2020-05-27 LAB — CBC
HCT: 42.7 % (ref 36.0–46.0)
Hemoglobin: 13.6 g/dL (ref 12.0–15.0)
MCH: 33.1 pg (ref 26.0–34.0)
MCHC: 31.9 g/dL (ref 30.0–36.0)
MCV: 103.9 fL — ABNORMAL HIGH (ref 80.0–100.0)
Platelets: 147 10*3/uL — ABNORMAL LOW (ref 150–400)
RBC: 4.11 MIL/uL (ref 3.87–5.11)
RDW: 16.6 % — ABNORMAL HIGH (ref 11.5–15.5)
WBC: 8.2 10*3/uL (ref 4.0–10.5)
nRBC: 0 % (ref 0.0–0.2)

## 2020-05-27 LAB — FOLATE: Folate: 17.2 ng/mL (ref >5.9)

## 2020-05-27 LAB — BASIC METABOLIC PANEL
Anion gap: 9 (ref 5–15)
BUN: 9 mg/dL (ref 8–23)
CO2: 24 mmol/L (ref 22–32)
Calcium: 8.8 mg/dL — ABNORMAL LOW (ref 8.9–10.3)
Chloride: 107 mmol/L (ref 98–111)
Creatinine, Ser: 0.76 mg/dL (ref 0.44–1.00)
GFR, Estimated: >60 mL/min (ref >60)
Glucose, Bld: 99 mg/dL (ref 70–99)
Potassium: 4.1 mmol/L (ref 3.5–5.1)
Sodium: 140 mmol/L (ref 135–145)

## 2020-05-27 LAB — MRSA PCR SCREENING: MRSA by PCR: NEGATIVE

## 2020-05-27 LAB — VITAMIN B12: Vitamin B-12: 5874 pg/mL — ABNORMAL HIGH (ref 180–914)

## 2020-05-27 LAB — TSH: TSH: 0.532 u[IU]/mL (ref 0.350–4.500)

## 2020-05-27 LAB — AMMONIA: Ammonia: 15 umol/L (ref 9–35)

## 2020-05-27 MED ORDER — FUROSEMIDE 20 MG PO TABS
20.0000 mg | ORAL_TABLET | Freq: Every day | ORAL | Status: DC
  Administered 2020-05-27 – 2020-06-01 (×6): 20 mg via ORAL
  Filled 2020-05-27 (×6): qty 1

## 2020-05-27 NOTE — Progress Notes (Addendum)
Pt not fully oriented. CSW called pt son Josephina Melcher to discuss SNF recommendation; left voicemail requesting return call.   80: Pt's daughter in law Debroah Baller called [facility]. Explained that Nicole Kindred is hard of hearing and sometimes phone calls are difficult for him. CSW speakes with daughter in law regarding SNF recommendation. Pt had gone to [place] last November. She has been living at [facility] ever since. Family is okay with SNF recommendation and SNF workup. CSW completed FL2 and sent bed requests in the hub.

## 2020-05-27 NOTE — Progress Notes (Signed)
Occupational Therapy Evaluation Patient Details Name: [name] MRN: [id number] DOB: Aug 22, 1920 Today's Date: 05/27/2020    History of Present Illness Pt is a 87 y/o female admitted from ALF secondary to weakness and confusion. Found to have a fib, bilateral pleural effusions and hypotension. PMH includes HTN, CAD s/p PCI, dCHF, and a fib.   Clinical Impression   PTA pt lives in a ALF. Unsure of PLOF however pt requires Mod A for mobility and ADL tasks. Pt presents with generalized weakness and apparent cognitive deficits. Feel pt would benefit from rehab at SNF to maximize functional level of independence. Will follow acutely.     Follow Up Recommendations  SNF;Supervision/Assistance - 24 hour    Equipment Recommendations  None recommended by OT    Recommendations for Other Services       Precautions / Restrictions Precautions Precautions: Fall Restrictions Weight Bearing Restrictions: No      Mobility Bed Mobility Overal bed mobility: Needs Assistance Bed Mobility: Supine to Sit     Supine to sit: Min assist     General bed mobility comments: Pt attempting to get out of bed upon entry to go to bathroom. Required safety cues to wait for PT/OT. Mod A for trunk assist. Pulling against IV  Lines/cords    Transfers Overall transfer level: Needs assistance Equipment used: 2 person hand held assist Transfers: Sit to/from [facility] Sit to Stand: Mod assist;+2 physical assistance Stand pivot transfers: Mod assist;+2 physical assistance       General transfer comment: Safety cues throughout. Pt with very flexed posture. Mod A +2 for steadying assist to stand and transfer to [facility] and then to recliner.    Balance Overall balance assessment: Needs assistance Sitting-balance support: Feet supported;Bilateral upper extremity supported Sitting balance-Leahy Scale: Fair     Standing balance support: Bilateral upper extremity supported Standing  balance-Leahy Scale: Poor Standing balance comment: Reliant on BUE support                           ADL either performed or assessed with clinical judgement   ADL Overall ADL's : Needs assistance/impaired     Grooming: Minimal assistance;Sitting Grooming Details (indicate cue type and reason): unable to sustain attention to complete tasks Upper Body Bathing: Minimal assistance;Sitting   Lower Body Bathing: Moderate assistance;Sit to/from stand   Upper Body Dressing : Minimal assistance;Sitting   Lower Body Dressing: Moderate assistance;Sit to/from stand   Toilet Transfer: Moderate assistance;Stand-pivot   Toileting- Clothing Manipulation and Hygiene: Moderate assistance;Sit to/from stand       Functional mobility during ADLs: Moderate assistance;+2 for safety/equipment       Vision         Perception     Praxis      Pertinent Vitals/Pain Pain Assessment: Faces Faces Pain Scale: Hurts little more Pain Location: R hip Pain Descriptors / Indicators: Guarding Pain Intervention(s): Limited activity within patient's tolerance     Hand Dominance Right   Extremity/Trunk Assessment Upper Extremity Assessment Upper Extremity Assessment: Generalized weakness   Lower Extremity Assessment Lower Extremity Assessment: Defer to PT evaluation   Cervical / Trunk Assessment Cervical / Trunk Assessment: Normal   Communication Communication Communication: HOH;Expressive difficulties (mumbled speech)   Cognition Arousal/Alertness: Awake/alert Behavior During Therapy: Impulsive Overall Cognitive Status: Impaired/Different from baseline Area of Impairment: Orientation;Attention;Memory;Safety/judgement;Following commands;Awareness;Problem solving                 Orientation Level: Disoriented  to;Place;Time;Situation Current Attention Level: Sustained Memory: Decreased recall of precautions;Decreased short-term memory Following Commands: Follows one step  commands with increased time Safety/Judgement: Decreased awareness of safety;Decreased awareness of deficits Awareness: Emergent Problem Solving: Slow processing General Comments: Impulsive with mobility tasks and requiring safety cues throughout. Pt pleasantly confused throughout.   General Comments  no family present    Exercises     Shoulder Instructions      Home Living Family/patient expects to be discharged to:: Skilled nursing facility                                 Additional Comments: Per RN, pt from ALF, but family concerned that ALF cannot provide level of care pt needs.      Prior Functioning/Environment Level of Independence: Needs assistance        Comments: Pt reports she was using RW for ambulation, however, unsure of accuracy given cognitive deficits. Anticipate pt was requiring assist with transfers and mobility tasks. Also anticipate pt required assist for ADL tasks. No family present during session.        OT Problem List: Decreased strength;Decreased range of motion;Decreased activity tolerance;Impaired balance (sitting and/or standing);Decreased cognition;Decreased safety awareness;Decreased knowledge of use of DME or AE;Cardiopulmonary status limiting activity      OT Treatment/Interventions: Self-care/ADL training;Therapeutic exercise;Neuromuscular education;Energy conservation;DME and/or AE instruction;Therapeutic activities;Cognitive remediation/compensation;Patient/family education;Balance training    OT Goals(Current goals can be found in the care plan section) Acute Rehab OT Goals Patient Stated Goal: "to go to the bathroom" OT Goal Formulation: Patient unable to participate in goal setting Time For Goal Achievement: 06/10/20 Potential to Achieve Goals: Good  OT Frequency: Min 2X/week   Barriers to D/C:            Co-evaluation PT/OT/SLP Co-Evaluation/Treatment: Yes Reason for Co-Treatment: Necessary to address  cognition/behavior during functional activity;For patient/therapist safety   OT goals addressed during session: ADL's and self-care      AM-PAC OT "6 Clicks" Daily Activity     Outcome Measure Help from another person eating meals?: A Little Help from another person taking care of personal grooming?: A Little Help from another person toileting, which includes using toliet, bedpan, or urinal?: A Lot Help from another person bathing (including washing, rinsing, drying)?: A Lot Help from another person to put on and taking off regular upper body clothing?: A Little Help from another person to put on and taking off regular lower body clothing?: A Lot 6 Click Score: 15   End of Session Equipment Utilized During Treatment: Gait belt Nurse Communication: Mobility status  Activity Tolerance: Patient tolerated treatment well Patient left: in chair;with call bell/phone within reach;with chair alarm set  OT Visit Diagnosis: Unsteadiness on feet (R26.81);Other abnormalities of gait and mobility (R26.89);Muscle weakness (generalized) (M62.81);Other symptoms and signs involving cognitive function                Time: 1318-1340 OT Time Calculation (min): 22 min Charges:  OT General Charges $OT Visit: 1 Visit OT Evaluation $OT Eval Moderate Complexity: Stella, OT/L   Acute OT Clinical Specialist Acute Rehabilitation Services Pager [phone] Office [phone]   [facility] 05/27/2020, 6:03 PM

## 2020-05-27 NOTE — Plan of Care (Signed)
  Problem: Health Behavior/Discharge Planning: Goal: Ability to manage health-related needs will improve Outcome: Progressing   Problem: Clinical Measurements: Goal: Will remain free from infection Outcome: Progressing Goal: Cardiovascular complication will be avoided Outcome: Progressing   Problem: Nutrition: Goal: Adequate nutrition will be maintained Outcome: Progressing   Problem: Pain Managment: Goal: General experience of comfort will improve Outcome: Progressing   Problem: Safety: Goal: Ability to remain free from injury will improve Outcome: Progressing

## 2020-05-27 NOTE — Progress Notes (Signed)
PROGRESS NOTE    [name]  GBT:[id number] DOB: 12-15-1920 DOA: 05/26/2020 PCP: Holland Commons, FNP   Chief Complain: Weakness  Brief Narrative: Patient is a 87 year old female with history of coronary disease status post PCI, chronic A. fib on Eliquis, history of PE, GERD, hypertension, hyperlipidemia who presented to the emerge department with complaints of weakness, confusion, flank pain.  Patient is from [place] facility.  On presentation she was found to be in A. fib with RVR.  Blood pressure was low.  She was given fluid boluses in the emergency department after after which blood pressure improved.  Initiated on Cardizem drip for the A. fib with RVR.  Assessment & Plan:   Principal Problem:   Rapid atrial fibrillation (HCC) Active Problems:   HYPERTENSION, BENIGN   GERD   CAD (coronary artery disease)   Hypothyroidism   Chronic diastolic CHF (congestive heart failure) (HCC)   Permanent A. fib with RVR: Started on Cardizem drip with control  in the heart rate.  She is already on Eliquis.  We will initiate her oral rate control medications which she was taking before. Echo done on 02/11/2020 showed ejection fraction of 60-65%, moderately elevated pulmonary artery systolic pressure.  Coronary artery disease: Troponin normal.  Denies any chest pain  Hypertension: Continue home regimen  Hypothyroidism: On Synthyroid  GERD: Continue PPI  Chronic diastolic congestive heart failure: CT abdomen/pelvis showed  bilateral pleural effusion right greater than left with basilar atelectasis. She had mild basilar crackles bilaterally. Started  on Lasix 20 mg daily.  Macrocytosis: On vitamin B12 supplement.  Hyperlipidemia: On statin at home.  Thrombocytopenia: Mild.  Continue to monitor.  She has mild thrombocytopenia upon reviewing her old lab reports  Debility/deconditioning: ALF resident at [facility]. PT/OT requested.She might need SNF care        DVT  prophylaxis:Eliquis Code Status: DNR Family Communication: Called and discussed with daughter on phone on 05/27/20 Status is: Inpatient  Remains inpatient appropriate because:Inpatient level of care appropriate due to severity of illness   Dispo: The patient is from: AlF              Anticipated d/c is to: SNF              Anticipated d/c date is:1- 2 days              Patient currently is not medically stable to d/c.   Difficult to place patient No    Consultants: None  Procedures:None  Antimicrobials:  Anti-infectives (From admission, onward)   Start     Dose/Rate Route Frequency Ordered Stop   05/27/20 1000  trimethoprim (TRIMPEX) tablet 100 mg        100 mg Oral Daily 05/26/20 2300        Subjective:  Patient seen and examined at the bedside this morning. Hemodynamically stable during my evaluation. Heart rate fluctuating in  the range of 90-100. She denies any chest pain or shortness of breath. Does not have peripheral edema but has bibasilar mild crackles.  Objective: Vitals:   05/27/20 0500 05/27/20 0530 05/27/20 0600 05/27/20 0630  BP: (!) 134/94 121/78 113/72 126/86  Pulse: (!) 107 92 81 93  Resp: 16 20 20 19   Temp:      TempSrc:      SpO2: 93% 97% 97% 97%  Weight:        Intake/Output Summary (Last 24 hours) at 05/27/2020 7106 Last data filed at 05/27/2020 0000 Gross per  24 hour  Intake 705.78 ml  Output 300 ml  Net 405.78 ml   Filed Weights   05/26/20 2255  Weight: 57 kg    Examination:  General exam: Very deconditioned, low-grade elderly female Respiratory system: Bilateral diminished air sounds on the bases, no wheezes but mild bibasilar crackles  cardiovascular system: Irregularly irregular rhythm. No JVD, murmurs, rubs, gallops or clicks. No pedal edema. Gastrointestinal system: Abdomen is nondistended, soft and nontender. No organomegaly or masses felt. Normal bowel sounds heard. Central nervous system: Alert and oriented. No focal  neurological deficits. Extremities: No edema, no clubbing ,no cyanosis Skin: No rashes, lesions or ulcers,no icterus ,no pallor  Data Reviewed: I have personally reviewed following labs and imaging studies  CBC: Recent Labs  Lab 05/26/20 1456 05/26/20 1506 05/27/20 0054  WBC 6.6  --  8.2  NEUTROABS 3.0  --   --   HGB 12.4 13.6 13.6  HCT 39.1 40.0 42.7  MCV 106.3*  --  103.9*  PLT 156  --  829*   Basic Metabolic Panel: Recent Labs  Lab 05/26/20 1456 05/26/20 1506 05/27/20 0054  NA 141 141 140  K 4.7 4.6 4.1  CL 107 106 107  CO2 25  --  24  GLUCOSE 106* 101* 99  BUN 11 13 9   CREATININE 0.94 0.90 0.76  CALCIUM 8.6*  --  8.8*   GFR: Estimated Creatinine Clearance: 36.2 mL/min (by C-G formula based on SCr of 0.76 mg/dL). Liver Function Tests: Recent Labs  Lab 05/26/20 1456  AST 29  ALT 15  ALKPHOS 45  BILITOT 1.1  PROT 5.3*  ALBUMIN 2.8*   No results for input(s): LIPASE, AMYLASE in the last 168 hours. No results for input(s): AMMONIA in the last 168 hours. Coagulation Profile: Recent Labs  Lab 05/26/20 1456  INR 1.4*   Cardiac Enzymes: No results for input(s): CKTOTAL, CKMB, CKMBINDEX, TROPONINI in the last 168 hours. BNP (last 3 results) No results for input(s): PROBNP in the last 8760 hours. HbA1C: No results for input(s): HGBA1C in the last 72 hours. CBG: Recent Labs  Lab 05/26/20 1454 05/26/20 2252 05/27/20 0610  GLUCAP 93 90 87   Lipid Profile: No results for input(s): CHOL, HDL, LDLCALC, TRIG, CHOLHDL, LDLDIRECT in the last 72 hours. Thyroid Function Tests: No results for input(s): TSH, T4TOTAL, FREET4, T3FREE, THYROIDAB in the last 72 hours. Anemia Panel: No results for input(s): VITAMINB12, FOLATE, FERRITIN, TIBC, IRON, RETICCTPCT in the last 72 hours. Sepsis Labs: Recent Labs  Lab 05/26/20 1456  LATICACIDVEN 1.5    Recent Results (from the past 240 hour(s))  Resp Panel by RT-PCR (Flu A&B, Covid) Nasopharyngeal Swab     Status:  None   Collection Time: 05/26/20  4:14 PM   Specimen: Nasopharyngeal Swab; Nasopharyngeal(NP) swabs in vial transport medium  Result Value Ref Range Status   SARS Coronavirus 2 by RT PCR NEGATIVE NEGATIVE Final    Comment: (NOTE) SARS-CoV-2 target nucleic acids are NOT DETECTED.  The SARS-CoV-2 RNA is generally detectable in upper respiratory specimens during the acute phase of infection. The lowest concentration of SARS-CoV-2 viral copies this assay can detect is 138 copies/mL. A negative result does not preclude SARS-Cov-2 infection and should not be used as the sole basis for treatment or other patient management decisions. A negative result may occur with  improper specimen collection/handling, submission of specimen other than nasopharyngeal swab, presence of viral mutation(s) within the areas targeted by this assay, and inadequate number of viral copies(<138  copies/mL). A negative result must be combined with clinical observations, patient history, and epidemiological information. The expected result is Negative.  Fact Sheet for Patients:  EntrepreneurPulse.com.au  Fact Sheet for Healthcare Providers:  IncredibleEmployment.be  This test is no t yet approved or cleared by the [place] FDA and  has been authorized for detection and/or diagnosis of SARS-CoV-2 by FDA under an Emergency Use Authorization (EUA). This EUA will remain  in effect (meaning this test can be used) for the duration of the COVID-19 declaration under Section 564(b)(1) of the Act, 21 U.S.C.section 360bbb-3(b)(1), unless the authorization is terminated  or revoked sooner.       Influenza A by PCR NEGATIVE NEGATIVE Final   Influenza B by PCR NEGATIVE NEGATIVE Final    Comment: (NOTE) The Xpert Xpress SARS-CoV-2/FLU/RSV plus assay is intended as an aid in the diagnosis of influenza from Nasopharyngeal swab specimens and should not be used as a sole basis for  treatment. Nasal washings and aspirates are unacceptable for Xpert Xpress SARS-CoV-2/FLU/RSV testing.  Fact Sheet for Patients: EntrepreneurPulse.com.au  Fact Sheet for Healthcare Providers: IncredibleEmployment.be  This test is not yet approved or cleared by the [place] FDA and has been authorized for detection and/or diagnosis of SARS-CoV-2 by FDA under an Emergency Use Authorization (EUA). This EUA will remain in effect (meaning this test can be used) for the duration of the COVID-19 declaration under Section 564(b)(1) of the Act, 21 U.S.C. section 360bbb-3(b)(1), unless the authorization is terminated or revoked.  Performed at [facility], [place] [address] [place], [place] [address]          Radiology Studies: CT Head Wo Contrast  Result Date: 05/26/2020 CLINICAL DATA:  Mental status change.  Suspected alcohol/drug use. EXAM: CT HEAD WITHOUT CONTRAST TECHNIQUE: Contiguous axial images were obtained from the base of the skull through the vertex without intravenous contrast. COMPARISON:  CT head 02/10/2020. FINDINGS: Brain: There is no evidence of acute intracranial hemorrhage, mass lesion, brain edema or extra-axial fluid collection. There is stable mild atrophy with prominence of the ventricles and subarachnoid spaces. Chronic small vessel ischemic changes in the periventricular white matter, left basal ganglia and left thalamus are unchanged. There is no CT evidence of acute cortical infarction. Vascular: Diffuse intracranial vascular calcifications. No hyperdense vessel identified. Skull: Negative for fracture or focal lesion. Sinuses/Orbits: The visualized paranasal sinuses and mastoid air cells are clear. Postsurgical changes in both globes. Other: None. IMPRESSION: 1. No acute intracranial findings. 2. Stable atrophy and chronic small vessel ischemic changes. Electronically Signed   By: Richardean Sale M.D.   On: 05/26/2020 15:42    CT ABDOMEN PELVIS W CONTRAST  Result Date: 05/26/2020 CLINICAL DATA:  Flank pain, calculus EXAM: CT ABDOMEN AND PELVIS WITH CONTRAST TECHNIQUE: Multidetector CT imaging of the abdomen and pelvis was performed using the standard protocol following bolus administration of intravenous contrast. CONTRAST:  156mL OMNIPAQUE IOHEXOL 350 MG/ML SOLN COMPARISON:  02/10/2020 FINDINGS: Lower chest: Bilateral pleural effusions, right greater than left. Patchy areas of consolidation within the bilateral lower lobes compatible with atelectasis. Prominent biatrial dilatation. No pericardial effusion. Hepatobiliary: Stable scarring and calcification superior right lobe liver just beneath the right hemidiaphragm. Heterogeneous liver attenuation likely due to arterial phase of contrast enhancement. Gallbladder is not identified and may be surgically absent. Pancreas: Unremarkable. No pancreatic ductal dilatation or surrounding inflammatory changes. Spleen: Normal in size without focal abnormality. Adrenals/Urinary Tract: Stable bilateral renal cortical atrophy. Simple cyst upper pole left kidney. No  urinary tract calculi or obstructive uropathy. No adrenal masses. Bladder is decompressed, limiting its evaluation. Stomach/Bowel: No bowel obstruction or ileus. Diverticulosis of the sigmoid colon without diverticulitis. No bowel wall thickening or inflammatory change. Vascular/Lymphatic: Aortic atherosclerosis. No enlarged abdominal or pelvic lymph nodes. Reproductive: Status post hysterectomy. No adnexal masses. Other: No free fluid or free gas. No abdominal wall hernia. Musculoskeletal: No acute or destructive bony lesions. Reconstructed images demonstrate no additional findings. IMPRESSION: 1. Bilateral pleural effusions, right greater than left, with bibasilar atelectasis. 2. Sigmoid diverticulosis without diverticulitis. 3. No urinary tract calculi or obstructive uropathy. 4. Aortic Atherosclerosis (ICD10-I70.0).  Electronically Signed   By: Randa Ngo M.D.   On: 05/26/2020 19:46   DG Chest Portable 1 View  Result Date: 05/26/2020 CLINICAL DATA:  Weakness, altered mental status, history coronary artery disease, hypertension EXAM: PORTABLE CHEST 1 VIEW COMPARISON:  Portable exam 1502 hours compared to 02/14/2020 FINDINGS: Upper normal size of cardiac silhouette. Mediastinal contours and pulmonary vascularity normal. Atherosclerotic calcification aorta. Bronchitic changes with bibasilar pleural effusions and atelectasis greater on RIGHT. Remaining lungs clear. No acute infiltrate or pneumothorax. Bones demineralized. IMPRESSION: Bronchitic changes with bibasilar pleural effusions and atelectasis, greater on RIGHT. Aortic Atherosclerosis (ICD10-I70.0). Electronically Signed   By: Lavonia Dana M.D.   On: 05/26/2020 15:35        Scheduled Meds: . acetaminophen  500 mg Oral Q8H  . apixaban  5 mg Oral BID  . atorvastatin  10 mg Oral Daily  . diltiazem  30 mg Oral BID  . [START ON 05/29/2020] furosemide  10 mg Oral Q Fri  . levothyroxine  100 mcg Oral Daily  . metoprolol tartrate  100 mg Oral BID  . mirabegron ER  50 mg Oral QPM  . multivitamin with minerals  1 tablet Oral QPM  . trimethoprim  100 mg Oral Daily  . vitamin B-12  500 mcg Oral Daily   Continuous Infusions: . diltiazem (CARDIZEM) infusion 10 mg/hr (05/27/20 0621)     LOS: 1 day    Time spent: More than 50% of that time was spent in counseling and/or coordination of care.      Shelly Coss, MD [facility] P2/16/2022, 7:02 AM

## 2020-05-27 NOTE — Evaluation (Signed)
Physical Therapy Evaluation Patient Details Name: [name] MRN: [id number] DOB: December 22, 1920 Today's Date: 05/27/2020   History of Present Illness  Pt is a 87 y/o female admitted from ALF secondary to weakness and confusion. Found to have a fib, bilateral pleural effusions and hypotension. PMH includes HTN, CAD s/p PCI, dCHF, and a fib.  Clinical Impression  Pt admitted secondary to problem above with deficits below. Pt pleasantly confused throughout and impulsive with mobility tasks. Required safety cues throughout. Requiring mod A +2 to stand and transfer to/from [facility] this session. Per RN, family reports they do not feel ALF can provide necessary level of care. No family present during session to discuss. Recommending SNF level therapies at d/c to increase independence and safety with mobility. Will continue to follow acutely.     Follow Up Recommendations SNF;Supervision/Assistance - 24 hour    Equipment Recommendations  Wheelchair cushion (measurements PT);Wheelchair (measurements PT);Hospital bed    Recommendations for Other Services       Precautions / Restrictions Precautions Precautions: Fall Restrictions Weight Bearing Restrictions: No      Mobility  Bed Mobility Overal bed mobility: Needs Assistance Bed Mobility: Supine to Sit     Supine to sit: Mod assist     General bed mobility comments: Pt attempting to get out of bed upon entry to go to bathroom. Required safety cues to wait for PT/OT. Mod A for trunk assist.    Transfers Overall transfer level: Needs assistance Equipment used: 2 person hand held assist Transfers: Sit to/from [facility] Sit to Stand: Mod assist;+2 physical assistance Stand pivot transfers: Mod assist;+2 physical assistance       General transfer comment: Safety cues throughout. Pt with very flexed posture. Mod A +2 for steadying assist to stand and transfer to [facility] and then to recliner.  Ambulation/Gait                 Stairs            Wheelchair Mobility    Modified Rankin (Stroke Patients Only)       Balance Overall balance assessment: Needs assistance Sitting-balance support: Feet supported;Bilateral upper extremity supported Sitting balance-Leahy Scale: Fair     Standing balance support: Bilateral upper extremity supported Standing balance-Leahy Scale: Poor Standing balance comment: Reliant on BUE support                             Pertinent Vitals/Pain Pain Assessment: Faces Faces Pain Scale: Hurts little more Pain Location: R hip Pain Descriptors / Indicators: Guarding Pain Intervention(s): Limited activity within patient's tolerance;Monitored during session;Repositioned    Home Living Family/patient expects to be discharged to:: Skilled nursing facility                 Additional Comments: Per RN, pt from ALF, but family concerned that ALF cannot provide level of care pt needs.    Prior Function           Comments: Pt reports she was using RW for ambulation, however, unsure of accuracy given cognitive deficits. Anticipate pt was requiring assist with transfers and mobility tasks. Also anticipate pt required assist for ADL tasks. No family present during session.     Hand Dominance        Extremity/Trunk Assessment   Upper Extremity Assessment Upper Extremity Assessment: Defer to OT evaluation    Lower Extremity Assessment Lower Extremity Assessment: Generalized weakness    Cervical /  Trunk Assessment Cervical / Trunk Assessment: Normal  Communication   Communication: HOH;Expressive difficulties (mumbled speech)  Cognition Arousal/Alertness: Awake/alert Behavior During Therapy: Impulsive Overall Cognitive Status: No family/caregiver present to determine baseline cognitive functioning                                 General Comments: Impulsive with mobility tasks and requiring safety cues throughout. Pt pleasantly  confused throughout.      General Comments General comments (skin integrity, edema, etc.): No family present    Exercises     Assessment/Plan    PT Assessment Patient needs continued PT services  PT Problem List Decreased strength;Decreased range of motion;Decreased activity tolerance;Decreased balance;Decreased mobility;Decreased knowledge of use of DME;Decreased safety awareness;Decreased cognition;Decreased knowledge of precautions       PT Treatment Interventions DME instruction;Gait training;Therapeutic activities;Functional mobility training;Therapeutic exercise;Balance training;Patient/family education    PT Goals (Current goals can be found in the Care Plan section)  Acute Rehab PT Goals Patient Stated Goal: "to go to the bathroom" PT Goal Formulation: With patient Time For Goal Achievement: 06/10/20 Potential to Achieve Goals: Fair    Frequency Min 2X/week   Barriers to discharge        Co-evaluation PT/OT/SLP Co-Evaluation/Treatment: Yes Reason for Co-Treatment: Complexity of the patient's impairments (multi-system involvement);For patient/therapist safety;Necessary to address cognition/behavior during functional activity PT goals addressed during session: Mobility/safety with mobility;Balance         AM-PAC PT "6 Clicks" Mobility  Outcome Measure Help needed turning from your back to your side while in a flat bed without using bedrails?: A Lot Help needed moving from lying on your back to sitting on the side of a flat bed without using bedrails?: A Lot Help needed moving to and from a bed to a chair (including a wheelchair)?: A Lot Help needed standing up from a chair using your arms (e.g., wheelchair or bedside chair)?: A Lot Help needed to walk in hospital room?: A Lot Help needed climbing 3-5 steps with a railing? : Total 6 Click Score: 11    End of Session Equipment Utilized During Treatment: Gait belt Activity Tolerance: Patient tolerated treatment  well Patient left: in chair;with call bell/phone within reach;with chair alarm set Nurse Communication: Mobility status;Other (comment) (pt SpO2 WFL on RA) PT Visit Diagnosis: Difficulty in walking, not elsewhere classified (R26.2);Muscle weakness (generalized) (M62.81);Unsteadiness on feet (R26.81)    Time: 5631-4970 PT Time Calculation (min) (ACUTE ONLY): 22 min   Charges:   PT Evaluation $PT Eval Moderate Complexity: 1 Mod          Reuel Derby, PT, DPT  Acute Rehabilitation Services  Pager: [phone] Office: [phone]   Rudean Hitt 05/27/2020, 2:13 PM

## 2020-05-27 NOTE — NC FL2 (Signed)
[place] LEVEL OF CARE SCREENING TOOL     IDENTIFICATION  Patient Name: [name] Birthdate: [birth year] Sex: female Admission Date (Current Location): 05/26/2020  [facility] and [place] Number:  Herbalist and Address:  [facility], [place] [address], [place], [place] [address]      Provider Number: [id number]  Attending Physician Name and Address:  Shelly Coss, MD  Relative Name and Phone Number:  [name], [place] [phone])   [phone] (Work Phone)    Current Level of Care: Hospital Recommended Level of Care: [place] Prior Approval Number:    Date Approved/Denied:   PASRR Number: [id number]  Discharge Plan: SNF    Current Diagnoses: Patient Active Problem List   Diagnosis Date Noted  . Rapid atrial fibrillation ([place]) 05/26/2020  . Acute pulmonary embolism ([place]) 02/10/2020  . Chronic diastolic CHF (congestive heart failure) ([place]) 02/10/2020  . New onset atrial fibrillation ([place]) 02/10/2020  . Lung nodule 02/10/2020  . History of echocardiogram   . GERD (gastroesophageal reflux disease)   . Diverticulosis   . Carotid bruit   . IBS (irritable bowel syndrome)   . Hypothyroidism   . HTN (hypertension)   . HLD (hyperlipidemia)   . CAD (coronary artery disease)   . Atypical chest pain 06/14/2012  . Chest pain 02/12/2012  . Fatigue 04/22/2011  . CAROTID BRUIT 03/19/2009  . HYPOTHYROIDISM 08/29/2008  . HEMORRHOIDS 08/29/2008  . GERD 08/29/2008  . DIVERTICULOSIS, COLON 08/29/2008  . HYPERLIPIDEMIA 07/04/2008  . HYPERTENSION, BENIGN 07/04/2008  . CAD, NATIVE VESSEL 07/04/2008  . IRRITABLE BOWEL SYNDROME 07/04/2008    Orientation RESPIRATION BLADDER Height & Weight     Self,Place  O2 (2L [place]) External catheter Weight: 125 lb 10.6 oz (57 kg) Height:     BEHAVIORAL SYMPTOMS/MOOD NEUROLOGICAL BOWEL NUTRITION STATUS      Continent Diet (See d/c summary)  AMBULATORY STATUS COMMUNICATION OF NEEDS Skin    Extensive Assist Verbally Normal                       Personal Care Assistance Level of Assistance  Bathing,Feeding,Dressing Bathing Assistance: Maximum assistance Feeding assistance: Independent Dressing Assistance: Maximum assistance     Functional Limitations Info  Sight,Speech,Hearing Sight Info: Adequate Hearing Info: Adequate Speech Info: Adequate    SPECIAL CARE FACTORS FREQUENCY  OT (By licensed OT),PT (By licensed PT)     PT Frequency: 5x/week OT Frequency: 5x/week            Contractures Contractures Info: Not present    Additional Factors Info  Code Status,Allergies Code Status Info: DNR Allergies Info: No known allergies           Current Medications (05/27/2020):  This is the current hospital active medication list Current Facility-Administered Medications  Medication Dose Route Frequency Provider Last Rate Last Admin  . acetaminophen (TYLENOL) tablet 500 mg  500 mg Oral Q8H Garba, Mohammad L, MD   500 mg at 05/27/20 1541  . acetaminophen (TYLENOL) tablet 650 mg  650 mg Oral Q4H PRN Elwyn Reach, MD      . apixaban (ELIQUIS) tablet 5 mg  5 mg Oral BID Elwyn Reach, MD   5 mg at 05/27/20 0903  . atorvastatin (LIPITOR) tablet 10 mg  10 mg Oral Daily Elwyn Reach, MD   10 mg at 05/27/20 0904  . diltiazem (CARDIZEM) tablet 30 mg  30 mg Oral BID Elwyn Reach, MD  30 mg at 05/27/20 0904  . furosemide (LASIX) tablet 20 mg  20 mg Oral Daily Shelly Coss, MD   20 mg at 05/27/20 0904  . hydrOXYzine (ATARAX/VISTARIL) tablet 25 mg  25 mg Oral QHS PRN Elwyn Reach, MD      . levothyroxine (SYNTHROID) tablet 100 mcg  100 mcg Oral Daily Elwyn Reach, MD   100 mcg at 05/27/20 0952  . metoprolol tartrate (LOPRESSOR) tablet 100 mg  100 mg Oral BID Gala Romney L, MD   100 mg at 05/27/20 0903  . mirabegron ER (MYRBETRIQ) tablet 50 mg  50 mg Oral QPM Garba, Mohammad L, MD      . multivitamin with minerals tablet 1 tablet  1  tablet Oral QPM Garba, Mohammad L, MD      . nitroGLYCERIN (NITROSTAT) SL tablet 0.4 mg  0.4 mg Sublingual Q5 min PRN Gala Romney L, MD      . ondansetron (ZOFRAN) injection 4 mg  4 mg Intravenous Q6H PRN Gala Romney L, MD      . trimethoprim (TRIMPEX) tablet 100 mg  100 mg Oral Daily Gala Romney L, MD   100 mg at 05/27/20 0905  . vitamin B-12 (CYANOCOBALAMIN) tablet 500 mcg  500 mcg Oral Daily Elwyn Reach, MD   500 mcg at 05/27/20 0347     Discharge Medications: Please see discharge summary for a list of discharge medications.  Relevant Imaging Results:  Relevant Lab Results:   Additional Information SS#[id number] [facility], [place]

## 2020-05-27 NOTE — Progress Notes (Signed)
Pt arrived to [place] at 2239. Pt AOx3. Pt remains in Afib with cardizem drip infusing. Skin assessed and CHG bath given. Please see flowsheet. Report received from [place].

## 2020-05-28 LAB — GLUCOSE, CAPILLARY
Glucose-Capillary: 101 mg/dL — ABNORMAL HIGH (ref 70–99)
Glucose-Capillary: 116 mg/dL — ABNORMAL HIGH (ref 70–99)
Glucose-Capillary: 140 mg/dL — ABNORMAL HIGH (ref 70–99)
Glucose-Capillary: 143 mg/dL — ABNORMAL HIGH (ref 70–99)
Glucose-Capillary: 95 mg/dL (ref 70–99)
Glucose-Capillary: 95 mg/dL (ref 70–99)

## 2020-05-28 LAB — CBC WITH DIFFERENTIAL/PLATELET
Abs Immature Granulocytes: 0.03 10*3/uL (ref 0.00–0.07)
Basophils Absolute: 0 10*3/uL (ref 0.0–0.1)
Basophils Relative: 0 %
Eosinophils Absolute: 0.3 10*3/uL (ref 0.0–0.5)
Eosinophils Relative: 4 %
HCT: 39.9 % (ref 36.0–46.0)
Hemoglobin: 13.1 g/dL (ref 12.0–15.0)
Immature Granulocytes: 0 %
Lymphocytes Relative: 25 %
Lymphs Abs: 2.3 10*3/uL (ref 0.7–4.0)
MCH: 33.9 pg (ref 26.0–34.0)
MCHC: 32.8 g/dL (ref 30.0–36.0)
MCV: 103.4 fL — ABNORMAL HIGH (ref 80.0–100.0)
Monocytes Absolute: 0.9 10*3/uL (ref 0.1–1.0)
Monocytes Relative: 10 %
Neutro Abs: 5.8 10*3/uL (ref 1.7–7.7)
Neutrophils Relative %: 61 %
Platelets: 180 10*3/uL (ref 150–400)
RBC: 3.86 MIL/uL — ABNORMAL LOW (ref 3.87–5.11)
RDW: 16.5 % — ABNORMAL HIGH (ref 11.5–15.5)
WBC: 9.5 10*3/uL (ref 4.0–10.5)
nRBC: 0 % (ref 0.0–0.2)

## 2020-05-28 LAB — BASIC METABOLIC PANEL
Anion gap: 9 (ref 5–15)
BUN: 7 mg/dL — ABNORMAL LOW (ref 8–23)
CO2: 29 mmol/L (ref 22–32)
Calcium: 9.2 mg/dL (ref 8.9–10.3)
Chloride: 103 mmol/L (ref 98–111)
Creatinine, Ser: 0.89 mg/dL (ref 0.44–1.00)
GFR, Estimated: 59 mL/min — ABNORMAL LOW (ref >60)
Glucose, Bld: 99 mg/dL (ref 70–99)
Potassium: 3.9 mmol/L (ref 3.5–5.1)
Sodium: 141 mmol/L (ref 135–145)

## 2020-05-28 LAB — RESP PANEL BY RT-PCR (FLU A&B, COVID) ARPGX2
Influenza A by PCR: NEGATIVE
Influenza B by PCR: NEGATIVE
SARS Coronavirus 2 by RT PCR: NEGATIVE

## 2020-05-28 MED ORDER — DILTIAZEM HCL ER COATED BEADS 180 MG PO CP24: 180.0000 mg | ORAL_CAPSULE | Freq: Every day | ORAL | Status: DC

## 2020-05-28 MED ORDER — DILTIAZEM HCL ER COATED BEADS 180 MG PO CP24
180.0000 mg | ORAL_CAPSULE | Freq: Every day | ORAL | Status: DC
  Administered 2020-05-28: 180 mg via ORAL
  Filled 2020-05-28: qty 1

## 2020-05-28 MED ORDER — FUROSEMIDE 20 MG PO TABS: 20.0000 mg | ORAL_TABLET | Freq: Every day | ORAL | Status: DC

## 2020-05-28 MED ORDER — DILTIAZEM HCL 60 MG PO TABS
60.0000 mg | ORAL_TABLET | Freq: Three times a day (TID) | ORAL | Status: DC
Start: 2020-05-29 — End: 2020-06-01
  Administered 2020-05-29 – 2020-06-01 (×10): 60 mg via ORAL
  Filled 2020-05-28 (×10): qty 1

## 2020-05-28 MED ORDER — DILTIAZEM HCL 60 MG PO TABS: 60.0000 mg | ORAL_TABLET | Freq: Three times a day (TID) | ORAL | Status: DC

## 2020-05-28 NOTE — TOC Progression Note (Addendum)
Transition of Care [facility]) - Progression Note    Patient Details  Name: MIKIYA NEBERGALL MRN: [id number] Date of Birth: [birth year]  Transition of Care [facility]) CM/SW [place], [place] Phone Number: 05/28/2020, 9:28 AM  Clinical Narrative:     CSW spoke with son, [name], and provided bed offers. Son chooses [facility]. He states it is closest to him and he has a friend there. CSW called [facility] and confirmed they can accept pt tomorrow. CSW to start insurance auth. Pt will need new covid test.   950: CSW called [employer] and requested pre auth for SNF and ambulance transport.   Expected Discharge Plan: [place] Barriers to Discharge: Continued Medical Work up  Expected Discharge Plan and Services Expected Discharge Plan: [place] arrangements for the past 2 months: [place]                                       Social Determinants of Health (SDOH) Interventions    Readmission Risk Interventions No flowsheet data found.

## 2020-05-28 NOTE — Progress Notes (Signed)
Wrong time

## 2020-05-28 NOTE — Discharge Instructions (Signed)
Information on my medicine - ELIQUIS (apixaban)  This medication education was reviewed with me or my healthcare representative as part of my discharge preparation.   Why was Eliquis prescribed for you? Eliquis was prescribed for you to reduce the risk of forming blood clots that can cause a stroke if you have a medical condition called atrial fibrillation (a type of irregular heartbeat) AND  to reduce the risk of new blood clots forming after deep vein thrombosis.  What do You need to know about Eliquis ? Take your Eliquis TWICE DAILY - one tablet in the morning and one tablet in the evening with or without food.  It would be best to take the doses about the same time each day.  If you have difficulty swallowing the tablet whole please discuss with your pharmacist how to take the medication safely.  Take Eliquis exactly as prescribed by your doctor and DO NOT stop taking Eliquis without talking to the doctor who prescribed the medication.  Stopping may increase your risk of developing a new clot or stroke.  Refill your prescription before you run out.  After discharge, you should have regular check-up appointments with your healthcare provider that is prescribing your Eliquis.  In the future your dose may need to be changed if your kidney function or weight changes by a significant amount or as you get older.  What do you do if you miss a dose? If you miss a dose, take it as soon as you remember on the same day and resume taking twice daily.  Do not take more than one dose of ELIQUIS at the same time.  Important Safety Information A possible side effect of Eliquis is bleeding. You should call your healthcare provider right away if you experience any of the following: ? Bleeding from an injury or your nose that does not stop. ? Unusual colored urine (red or dark brown) or unusual colored stools (red or black). ? Unusual bruising for unknown reasons. ? A serious fall or if you hit  your head (even if there is no bleeding).  Some medicines may interact with Eliquis and might increase your risk of bleeding or clotting while on Eliquis. To help avoid this, consult your healthcare provider or pharmacist prior to using any new prescription or non-prescription medications, including herbals, vitamins, non-steroidal anti-inflammatory drugs (NSAIDs) and supplements.  This website has more information on Eliquis (apixaban): [email or web address].

## 2020-05-28 NOTE — Plan of Care (Signed)

## 2020-05-28 NOTE — Discharge Summary (Addendum)
Physician Discharge Summary  [name] KXF:[id number] DOB: 1920-10-18 DOA: 05/26/2020  PCP: Holland Commons, FNP  Admit date: 05/26/2020 Discharge date: 05/29/20 Admitted From:ALF Disposition:  SNF  Discharge Condition:Stable CODE STATUS:FULL Diet recommendation: Heart Healthy  Brief/Interim Summary:  Patient is a 87 year old female with history of coronary disease status post PCI, chronic A. fib on Eliquis, history of PE, GERD, hypertension, hyperlipidemia who presented to the emerge department with complaints of weakness, confusion, flank pain.  Patient is from [place] facility.  On presentation she was found to be in A. fib with RVR.  Blood pressure was low.  She was given fluid boluses in the emergency department after after which blood pressure improved.  Initiated on Cardizem drip for the A. fib with RVR.  Currently her rate is controlled after medications are adjusted.  There was concern of confusion from family, MRI of the brain did not show any acute intracranial abnormalities.   PT/OT recommended skilled assistant discharge.  Medically stable for discharge whenever possible.  Following problems were addressed during her hospitalization:  Permanent A. fib with RVR: Started on Cardizem drip with control  in the heart rate.  She is already on Eliquis.   Echo done on 02/11/2020 showed ejection fraction of 60-65%, moderately elevated pulmonary artery systolic pressure. She is on metoprolol which we will continue. Oral cardizem increased to 60 mg 3 times a day.  Cudnt put on  long-acting Cardizem because she cannot swallow the capsule. We recommend to follow-up with her cardiologist as an outpatient in 1 to 2 weeks.  Coronary artery disease: Stable.Troponin normal.  Denies any chest pain  Hypertension: Continue home regimen.BP stable  Hypothyroidism: On Synthyroid  Chronic diastolic congestive heart failure: CT abdomen/pelvis showed  bilateral pleural effusion right  greater than left with basilar atelectasis. She had mild basilar crackles bilaterally. Started  on Lasix 20 mg daily.  Macrocytosis: On vitamin B12 supplement.  Hyperlipidemia: On statin   Thrombocytopenia: Mild.  Continue to monitor.  She has mild thrombocytopenia upon reviewing her old lab reports.  Check CBC in a week  Memory issues/dementia: There was concern of worsening confusion.  CT head/MRI of the brain did not show any acute intracranial abnormalities but showed chronic infarcts.  Continue supportive care  Debility/deconditioning: ALF resident at [facility]. PT/OT requested.She might need SNF care     Discharge Diagnoses:  Principal Problem:   Rapid atrial fibrillation ([place]) Active Problems:   HYPERTENSION, BENIGN   GERD   CAD (coronary artery disease)   Hypothyroidism   Chronic diastolic CHF (congestive heart failure) [facility])    Discharge Instructions  Discharge Instructions    Diet - low sodium heart healthy   Complete by: As directed    Discharge instructions   Complete by: As directed    1)Please take prescribed medications as instructed 2)Follow up with your cardiologist in 2 weeks.   Increase activity slowly   Complete by: As directed      Allergies as of 05/28/2020   No Known Allergies     Medication List    TAKE these medications   acetaminophen 500 MG tablet Commonly known as: TYLENOL Take 500 mg by mouth every 8 (eight) hours.   apixaban 5 MG Tabs tablet Commonly known as: ELIQUIS Take 1 tablet (5 mg total) by mouth 2 (two) times daily.   atorvastatin 10 MG tablet Commonly known as: LIPITOR Take 1 tablet (10 mg total) by mouth daily.   diltiazem 60 MG tablet Commonly  known as: CARDIZEM Take 1 tablet (60 mg total) by mouth 3 (three) times daily. What changed:   medication strength  how much to take  when to take this   furosemide 20 MG tablet Commonly known as: LASIX Take 1 tablet (20 mg total) by mouth daily. Start taking  on: May 29, 2020 What changed:   how much to take  when to take this  additional instructions   hydrOXYzine 25 MG tablet Commonly known as: ATARAX/VISTARIL Take 25 mg by mouth at bedtime as needed for anxiety.   levothyroxine 100 MCG tablet Commonly known as: SYNTHROID Take 1 tablet by mouth daily.   metoprolol tartrate 100 MG tablet Commonly known as: LOPRESSOR Take 100 mg by mouth 2 (two) times daily.   multivitamin tablet Take 1 tablet by mouth every evening.   Myrbetriq 50 MG Tb24 tablet Generic drug: mirabegron ER Take 50 mg by mouth every evening.   nitroGLYCERIN 0.4 MG SL tablet Commonly known as: NITROSTAT Place 0.4 mg under the tongue every 5 (five) minutes as needed. For chest pain   trimethoprim 100 MG tablet Commonly known as: TRIMPEX Take 100 mg by mouth daily.   vitamin B-12 500 MCG tablet Commonly known as: CYANOCOBALAMIN Take 500 mcg by mouth daily. What changed: Another medication with the same name was removed. Continue taking this medication, and follow the directions you see here.       Follow-up Information    Holland Commons, FNP. Schedule an appointment as soon as possible for a visit in 1 week(s).   Specialty: Internal Medicine Contact information: [address] [place] [address] [phone]        Lelon Perla, MD. Schedule an appointment as soon as possible for a visit in 2 week(s).   Specialty: Cardiology Contact information: [address] [place] [address] [phone]              No Known Allergies  Consultations:  None   Procedures/Studies: CT Head Wo Contrast  Result Date: 05/26/2020 CLINICAL DATA:  Mental status change.  Suspected alcohol/drug use. EXAM: CT HEAD WITHOUT CONTRAST TECHNIQUE: Contiguous axial images were obtained from the base of the skull through the vertex without intravenous contrast. COMPARISON:  CT head 02/10/2020. FINDINGS: Brain: There  is no evidence of acute intracranial hemorrhage, mass lesion, brain edema or extra-axial fluid collection. There is stable mild atrophy with prominence of the ventricles and subarachnoid spaces. Chronic small vessel ischemic changes in the periventricular white matter, left basal ganglia and left thalamus are unchanged. There is no CT evidence of acute cortical infarction. Vascular: Diffuse intracranial vascular calcifications. No hyperdense vessel identified. Skull: Negative for fracture or focal lesion. Sinuses/Orbits: The visualized paranasal sinuses and mastoid air cells are clear. Postsurgical changes in both globes. Other: None. IMPRESSION: 1. No acute intracranial findings. 2. Stable atrophy and chronic small vessel ischemic changes. Electronically Signed   By: Richardean Sale M.D.   On: 05/26/2020 15:42   MR BRAIN WO CONTRAST  Result Date: 05/27/2020 CLINICAL DATA:  Delirium. EXAM: MRI HEAD WITHOUT CONTRAST TECHNIQUE: Multiplanar, multiecho pulse sequences of the brain and surrounding structures were obtained without intravenous contrast. COMPARISON:  Prior head CT examinations 05/26/2020 and earlier. FINDINGS: Brain: The examination is intermittently motion degraded. Most notably, there is mild-to-moderate motion degradation of the axial T2/FLAIR sequence, mild-to-moderate motion degradation of the axial SWI sequence, mild-to-moderate motion degradation of the axial T1 weighted sequence and moderate motion degradation of the  coronal T2 weighted sequence. Moderate cerebral atrophy. Commensurate prominence of the lateral and third ventricles. Comparatively mild cerebellar atrophy. Advanced patchy and confluent T2/FLAIR hyperintensity within the cerebral white matter is nonspecific, but compatible with chronic small vessel ischemic disease. Chronic lacunar infarcts within the cerebral white matter, bilateral basal ganglia, bilateral thalami and bilateral cerebellar hemispheres. There is no acute infarct.  No evidence of intracranial mass. No chronic intracranial blood products. No extra-axial fluid collection. No midline shift. Vascular: Expected proximal arterial flow voids. Skull and upper cervical spine: No focal marrow lesion. Incompletely assessed cervical spondylosis Sinuses/Orbits: Visualized orbits show no acute finding. Prior bilateral lens replacement. Trace ethmoid and right sphenoid sinus mucosal thickening. IMPRESSION: Intermittently motion degraded examination, as described and limiting evaluation. The diffusion-weighted imaging is of good quality. No evidence of acute infarct. No acute intracranial abnormality is identified. Moderate cerebral atrophy with comparatively mild cerebellar atrophy. Chronic lacunar infarcts within the cerebral white matter, bilateral basal ganglia, bilateral thalami and bilateral cerebellar hemispheres. Background advanced cerebral white matter chronic small vessel ischemic disease. Electronically Signed   By: Kellie Simmering DO   On: 05/27/2020 17:59   CT ABDOMEN PELVIS W CONTRAST  Result Date: 05/26/2020 CLINICAL DATA:  Flank pain, calculus EXAM: CT ABDOMEN AND PELVIS WITH CONTRAST TECHNIQUE: Multidetector CT imaging of the abdomen and pelvis was performed using the standard protocol following bolus administration of intravenous contrast. CONTRAST:  122mL OMNIPAQUE IOHEXOL 350 MG/ML SOLN COMPARISON:  02/10/2020 FINDINGS: Lower chest: Bilateral pleural effusions, right greater than left. Patchy areas of consolidation within the bilateral lower lobes compatible with atelectasis. Prominent biatrial dilatation. No pericardial effusion. Hepatobiliary: Stable scarring and calcification superior right lobe liver just beneath the right hemidiaphragm. Heterogeneous liver attenuation likely due to arterial phase of contrast enhancement. Gallbladder is not identified and may be surgically absent. Pancreas: Unremarkable. No pancreatic ductal dilatation or surrounding inflammatory  changes. Spleen: Normal in size without focal abnormality. Adrenals/Urinary Tract: Stable bilateral renal cortical atrophy. Simple cyst upper pole left kidney. No urinary tract calculi or obstructive uropathy. No adrenal masses. Bladder is decompressed, limiting its evaluation. Stomach/Bowel: No bowel obstruction or ileus. Diverticulosis of the sigmoid colon without diverticulitis. No bowel wall thickening or inflammatory change. Vascular/Lymphatic: Aortic atherosclerosis. No enlarged abdominal or pelvic lymph nodes. Reproductive: Status post hysterectomy. No adnexal masses. Other: No free fluid or free gas. No abdominal wall hernia. Musculoskeletal: No acute or destructive bony lesions. Reconstructed images demonstrate no additional findings. IMPRESSION: 1. Bilateral pleural effusions, right greater than left, with bibasilar atelectasis. 2. Sigmoid diverticulosis without diverticulitis. 3. No urinary tract calculi or obstructive uropathy. 4. Aortic Atherosclerosis (ICD10-I70.0). Electronically Signed   By: Randa Ngo M.D.   On: 05/26/2020 19:46   DG Chest Portable 1 View  Result Date: 05/26/2020 CLINICAL DATA:  Weakness, altered mental status, history coronary artery disease, hypertension EXAM: PORTABLE CHEST 1 VIEW COMPARISON:  Portable exam 1502 hours compared to 02/14/2020 FINDINGS: Upper normal size of cardiac silhouette. Mediastinal contours and pulmonary vascularity normal. Atherosclerotic calcification aorta. Bronchitic changes with bibasilar pleural effusions and atelectasis greater on RIGHT. Remaining lungs clear. No acute infiltrate or pneumothorax. Bones demineralized. IMPRESSION: Bronchitic changes with bibasilar pleural effusions and atelectasis, greater on RIGHT. Aortic Atherosclerosis (ICD10-I70.0). Electronically Signed   By: Lavonia Dana M.D.   On: 05/26/2020 15:35      Subjective: Patient seen and examined the bedside this morning.  Hemodynamically stable and comfortable during my  evaluation.  Discharge Exam: Vitals:   05/28/20 1117 05/28/20 1447  BP: 112/75 (!) 135/98  Pulse: 66   Resp: 18   Temp: (!) 97.3 F (36.3 C)   SpO2:     Vitals:   05/28/20 0745 05/28/20 0937 05/28/20 1117 05/28/20 1447  BP: (!) 156/74 (!) 139/95 112/75 (!) 135/98  Pulse: (!) 109  66   Resp: 20  18   Temp: (!) 97.5 F (36.4 C)  (!) 97.3 F (36.3 C)   TempSrc: Axillary  Axillary   SpO2:      Weight:        General: Pt is alert, awake, not in acute distress, pleasant elderly female Cardiovascular: Irregularly irregular rhythm, no rubs, no gallops Respiratory: Diminished air sounds on the bases, no wheezing, no rhonchi Abdominal: Soft, NT, ND, bowel sounds + Extremities: no edema, no cyanosis    The results of significant diagnostics from this hospitalization (including imaging, microbiology, ancillary and laboratory) are listed below for reference.     Microbiology: Recent Results (from the past 240 hour(s))  Blood culture (routine x 2)     Status: None (Preliminary result)   Collection Time: 05/26/20  3:56 PM   Specimen: BLOOD RIGHT ARM  Result Value Ref Range Status   Specimen Description BLOOD RIGHT ARM  Final   Special Requests   Final    BOTTLES DRAWN AEROBIC AND ANAEROBIC Blood Culture adequate volume   Culture   Final    NO GROWTH 2 DAYS Performed at [facility], [address], [place], [place] [address]    Report Status PENDING  Incomplete  Resp Panel by RT-PCR (Flu A&B, Covid) Nasopharyngeal Swab     Status: None   Collection Time: 05/26/20  4:14 PM   Specimen: Nasopharyngeal Swab; Nasopharyngeal(NP) swabs in vial transport medium  Result Value Ref Range Status   SARS Coronavirus 2 by RT PCR NEGATIVE NEGATIVE Final    Comment: (NOTE) SARS-CoV-2 target nucleic acids are NOT DETECTED.  The SARS-CoV-2 RNA is generally detectable in upper respiratory specimens during the acute phase of infection. The lowest concentration of SARS-CoV-2 viral copies  this assay can detect is 138 copies/mL. A negative result does not preclude SARS-Cov-2 infection and should not be used as the sole basis for treatment or other patient management decisions. A negative result may occur with  improper specimen collection/handling, submission of specimen other than nasopharyngeal swab, presence of viral mutation(s) within the areas targeted by this assay, and inadequate number of viral copies(<138 copies/mL). A negative result must be combined with clinical observations, patient history, and epidemiological information. The expected result is Negative.  Fact Sheet for Patients:  EntrepreneurPulse.com.au  Fact Sheet for Healthcare Providers:  IncredibleEmployment.be  This test is no t yet approved or cleared by the [place] FDA and  has been authorized for detection and/or diagnosis of SARS-CoV-2 by FDA under an Emergency Use Authorization (EUA). This EUA will remain  in effect (meaning this test can be used) for the duration of the COVID-19 declaration under Section 564(b)(1) of the Act, 21 U.S.C.section 360bbb-3(b)(1), unless the authorization is terminated  or revoked sooner.       Influenza A by PCR NEGATIVE NEGATIVE Final   Influenza B by PCR NEGATIVE NEGATIVE Final    Comment: (NOTE) The Xpert Xpress SARS-CoV-2/FLU/RSV plus assay is intended as an aid in the diagnosis of influenza from Nasopharyngeal swab specimens and should not be used as a sole basis for treatment. Nasal washings and aspirates are unacceptable for Xpert Xpress SARS-CoV-2/FLU/RSV testing.  Fact Sheet  for Patients: EntrepreneurPulse.com.au  Fact Sheet for Healthcare Providers: IncredibleEmployment.be  This test is not yet approved or cleared by the [place] FDA and has been authorized for detection and/or diagnosis of SARS-CoV-2 by FDA under an Emergency Use Authorization (EUA). This EUA  will remain in effect (meaning this test can be used) for the duration of the COVID-19 declaration under Section 564(b)(1) of the Act, 21 U.S.C. section 360bbb-3(b)(1), unless the authorization is terminated or revoked.  Performed at [facility], [place] [address], [place], [place] [address]   MRSA PCR Screening     Status: None   Collection Time: 05/26/20 11:35 PM   Specimen: Nasal Mucosa; Nasopharyngeal  Result Value Ref Range Status   MRSA by PCR NEGATIVE NEGATIVE Final    Comment:        The GeneXpert MRSA Assay (FDA approved for NASAL specimens only), is one component of a comprehensive MRSA colonization surveillance program. It is not intended to diagnose MRSA infection nor to guide or monitor treatment for MRSA infections. Performed at [facility], [place] [address], [place], [place] [address]   Culture, blood (Routine X 2) w Reflex to ID Panel     Status: None (Preliminary result)   Collection Time: 05/27/20 12:54 AM   Specimen: BLOOD  Result Value Ref Range Status   Specimen Description BLOOD RIGHT HAND  Final   Special Requests   Final    BOTTLES DRAWN AEROBIC ONLY Blood Culture adequate volume   Culture   Final    NO GROWTH 1 DAY Performed at [facility], [place] [address], [place], [place] [address]    Report Status PENDING  Incomplete  Resp Panel by RT-PCR (Flu A&B, Covid)     Status: None   Collection Time: 05/28/20 11:10 AM   Specimen: Nasopharyngeal(NP) swabs in vial transport medium  Result Value Ref Range Status   SARS Coronavirus 2 by RT PCR NEGATIVE NEGATIVE Final    Comment: (NOTE) SARS-CoV-2 target nucleic acids are NOT DETECTED.  The SARS-CoV-2 RNA is generally detectable in upper respiratory specimens during the acute phase of infection. The lowest concentration of SARS-CoV-2 viral copies this assay can detect is 138 copies/mL. A negative result does not preclude SARS-Cov-2 infection and should not be used as the sole basis for  treatment or other patient management decisions. A negative result may occur with  improper specimen collection/handling, submission of specimen other than nasopharyngeal swab, presence of viral mutation(s) within the areas targeted by this assay, and inadequate number of viral copies(<138 copies/mL). A negative result must be combined with clinical observations, patient history, and epidemiological information. The expected result is Negative.  Fact Sheet for Patients:  EntrepreneurPulse.com.au  Fact Sheet for Healthcare Providers:  IncredibleEmployment.be  This test is no t yet approved or cleared by the [place] FDA and  has been authorized for detection and/or diagnosis of SARS-CoV-2 by FDA under an Emergency Use Authorization (EUA). This EUA will remain  in effect (meaning this test can be used) for the duration of the COVID-19 declaration under Section 564(b)(1) of the Act, 21 U.S.C.section 360bbb-3(b)(1), unless the authorization is terminated  or revoked sooner.       Influenza A by PCR NEGATIVE NEGATIVE Final   Influenza B by PCR NEGATIVE NEGATIVE Final    Comment: (NOTE) The Xpert Xpress SARS-CoV-2/FLU/RSV plus assay is intended as an aid in the diagnosis of influenza from Nasopharyngeal swab specimens and should not be used as a  sole basis for treatment. Nasal washings and aspirates are unacceptable for Xpert Xpress SARS-CoV-2/FLU/RSV testing.  Fact Sheet for Patients: EntrepreneurPulse.com.au  Fact Sheet for Healthcare Providers: IncredibleEmployment.be  This test is not yet approved or cleared by the [place] FDA and has been authorized for detection and/or diagnosis of SARS-CoV-2 by FDA under an Emergency Use Authorization (EUA). This EUA will remain in effect (meaning this test can be used) for the duration of the COVID-19 declaration under Section 564(b)(1) of the Act, 21  U.S.C. section 360bbb-3(b)(1), unless the authorization is terminated or revoked.  Performed at [facility], [place] [address], [place], [place] [address]      Labs: BNP (last 3 results) Recent Labs    02/10/20 1836  BNP 326.7*   Basic Metabolic Panel: Recent Labs  Lab 05/26/20 1456 05/26/20 1506 05/27/20 0054 05/28/20 0039  NA 141 141 140 141  K 4.7 4.6 4.1 3.9  CL 107 106 107 103  CO2 25  --  24 29  GLUCOSE 106* 101* 99 99  BUN 11 13 9  7*  CREATININE 0.94 0.90 0.76 0.89  CALCIUM 8.6*  --  8.8* 9.2   Liver Function Tests: Recent Labs  Lab 05/26/20 1456  AST 29  ALT 15  ALKPHOS 45  BILITOT 1.1  PROT 5.3*  ALBUMIN 2.8*   No results for input(s): LIPASE, AMYLASE in the last 168 hours. Recent Labs  Lab 05/27/20 1838  AMMONIA 15   CBC: Recent Labs  Lab 05/26/20 1456 05/26/20 1506 05/27/20 0054 05/28/20 0039  WBC 6.6  --  8.2 9.5  NEUTROABS 3.0  --   --  5.8  HGB 12.4 13.6 13.6 13.1  HCT 39.1 40.0 42.7 39.9  MCV 106.3*  --  103.9* 103.4*  PLT 156  --  147* 180   Cardiac Enzymes: No results for input(s): CKTOTAL, CKMB, CKMBINDEX, TROPONINI in the last 168 hours. BNP: Invalid input(s): POCBNP CBG: Recent Labs  Lab 05/27/20 1939 05/27/20 2324 05/28/20 0324 05/28/20 0742 05/28/20 1114  GLUCAP 120* 102* 95 101* 140*   D-Dimer No results for input(s): DDIMER in the last 72 hours. Hgb A1c No results for input(s): HGBA1C in the last 72 hours. Lipid Profile No results for input(s): CHOL, HDL, LDLCALC, TRIG, CHOLHDL, LDLDIRECT in the last 72 hours. Thyroid function studies Recent Labs    05/27/20 1546  TSH 0.532   Anemia work up Recent Labs    05/27/20 1546  VITAMINB12 5,874*  FOLATE 17.2   Urinalysis    Component Value Date/Time   COLORURINE AMBER (A) 05/26/2020 1710   APPEARANCEUR HAZY (A) 05/26/2020 1710   LABSPEC 1.027 05/26/2020 1710   PHURINE 5.0 05/26/2020 1710   GLUCOSEU NEGATIVE 05/26/2020 1710   HGBUR NEGATIVE  05/26/2020 1710   BILIRUBINUR NEGATIVE 05/26/2020 1710   BILIRUBINUR negative 02/10/2020 1608   KETONESUR 5 (A) 05/26/2020 1710   PROTEINUR 30 (A) 05/26/2020 1710   UROBILINOGEN 1.0 02/10/2020 1608   UROBILINOGEN 0.2 02/13/2012 0221   NITRITE NEGATIVE 05/26/2020 1710   LEUKOCYTESUR NEGATIVE 05/26/2020 1710   Sepsis Labs Invalid input(s): PROCALCITONIN,  WBC,  LACTICIDVEN Microbiology Recent Results (from the past 240 hour(s))  Blood culture (routine x 2)     Status: None (Preliminary result)   Collection Time: 05/26/20  3:56 PM   Specimen: BLOOD RIGHT ARM  Result Value Ref Range Status   Specimen Description BLOOD RIGHT ARM  Final   Special Requests   Final    BOTTLES DRAWN  AEROBIC AND ANAEROBIC Blood Culture adequate volume   Culture   Final    NO GROWTH 2 DAYS Performed at [facility], [place] [address] [place], [place] [address]    Report Status PENDING  Incomplete  Resp Panel by RT-PCR (Flu A&B, Covid) Nasopharyngeal Swab     Status: None   Collection Time: 05/26/20  4:14 PM   Specimen: Nasopharyngeal Swab; Nasopharyngeal(NP) swabs in vial transport medium  Result Value Ref Range Status   SARS Coronavirus 2 by RT PCR NEGATIVE NEGATIVE Final    Comment: (NOTE) SARS-CoV-2 target nucleic acids are NOT DETECTED.  The SARS-CoV-2 RNA is generally detectable in upper respiratory specimens during the acute phase of infection. The lowest concentration of SARS-CoV-2 viral copies this assay can detect is 138 copies/mL. A negative result does not preclude SARS-Cov-2 infection and should not be used as the sole basis for treatment or other patient management decisions. A negative result may occur with  improper specimen collection/handling, submission of specimen other than nasopharyngeal swab, presence of viral mutation(s) within the areas targeted by this assay, and inadequate number of viral copies(<138 copies/mL). A negative result must be combined with clinical  observations, patient history, and epidemiological information. The expected result is Negative.  Fact Sheet for Patients:  EntrepreneurPulse.com.au  Fact Sheet for Healthcare Providers:  IncredibleEmployment.be  This test is no t yet approved or cleared by the [place] FDA and  has been authorized for detection and/or diagnosis of SARS-CoV-2 by FDA under an Emergency Use Authorization (EUA). This EUA will remain  in effect (meaning this test can be used) for the duration of the COVID-19 declaration under Section 564(b)(1) of the Act, 21 U.S.C.section 360bbb-3(b)(1), unless the authorization is terminated  or revoked sooner.       Influenza A by PCR NEGATIVE NEGATIVE Final   Influenza B by PCR NEGATIVE NEGATIVE Final    Comment: (NOTE) The Xpert Xpress SARS-CoV-2/FLU/RSV plus assay is intended as an aid in the diagnosis of influenza from Nasopharyngeal swab specimens and should not be used as a sole basis for treatment. Nasal washings and aspirates are unacceptable for Xpert Xpress SARS-CoV-2/FLU/RSV testing.  Fact Sheet for Patients: EntrepreneurPulse.com.au  Fact Sheet for Healthcare Providers: IncredibleEmployment.be  This test is not yet approved or cleared by the [place] FDA and has been authorized for detection and/or diagnosis of SARS-CoV-2 by FDA under an Emergency Use Authorization (EUA). This EUA will remain in effect (meaning this test can be used) for the duration of the COVID-19 declaration under Section 564(b)(1) of the Act, 21 U.S.C. section 360bbb-3(b)(1), unless the authorization is terminated or revoked.  Performed at [facility], [place] [address], [place], [place] [address]   MRSA PCR Screening     Status: None   Collection Time: 05/26/20 11:35 PM   Specimen: Nasal Mucosa; Nasopharyngeal  Result Value Ref Range Status   MRSA by PCR NEGATIVE NEGATIVE Final     Comment:        The GeneXpert MRSA Assay (FDA approved for NASAL specimens only), is one component of a comprehensive MRSA colonization surveillance program. It is not intended to diagnose MRSA infection nor to guide or monitor treatment for MRSA infections. Performed at [facility], [place] [address], [place], [place] [address]   Culture, blood (Routine X 2) w Reflex to ID Panel     Status: None (Preliminary result)   Collection Time: 05/27/20 12:54 AM   Specimen: BLOOD  Result Value  Ref Range Status   Specimen Description BLOOD RIGHT HAND  Final   Special Requests   Final    BOTTLES DRAWN AEROBIC ONLY Blood Culture adequate volume   Culture   Final    NO GROWTH 1 DAY Performed at [facility], [address], [place], [place] [address]    Report Status PENDING  Incomplete  Resp Panel by RT-PCR (Flu A&B, Covid)     Status: None   Collection Time: 05/28/20 11:10 AM   Specimen: Nasopharyngeal(NP) swabs in vial transport medium  Result Value Ref Range Status   SARS Coronavirus 2 by RT PCR NEGATIVE NEGATIVE Final    Comment: (NOTE) SARS-CoV-2 target nucleic acids are NOT DETECTED.  The SARS-CoV-2 RNA is generally detectable in upper respiratory specimens during the acute phase of infection. The lowest concentration of SARS-CoV-2 viral copies this assay can detect is 138 copies/mL. A negative result does not preclude SARS-Cov-2 infection and should not be used as the sole basis for treatment or other patient management decisions. A negative result may occur with  improper specimen collection/handling, submission of specimen other than nasopharyngeal swab, presence of viral mutation(s) within the areas targeted by this assay, and inadequate number of viral copies(<138 copies/mL). A negative result must be combined with clinical observations, patient history, and epidemiological information. The expected result is Negative.  Fact Sheet for Patients:   EntrepreneurPulse.com.au  Fact Sheet for Healthcare Providers:  IncredibleEmployment.be  This test is no t yet approved or cleared by the [place] FDA and  has been authorized for detection and/or diagnosis of SARS-CoV-2 by FDA under an Emergency Use Authorization (EUA). This EUA will remain  in effect (meaning this test can be used) for the duration of the COVID-19 declaration under Section 564(b)(1) of the Act, 21 U.S.C.section 360bbb-3(b)(1), unless the authorization is terminated  or revoked sooner.       Influenza A by PCR NEGATIVE NEGATIVE Final   Influenza B by PCR NEGATIVE NEGATIVE Final    Comment: (NOTE) The Xpert Xpress SARS-CoV-2/FLU/RSV plus assay is intended as an aid in the diagnosis of influenza from Nasopharyngeal swab specimens and should not be used as a sole basis for treatment. Nasal washings and aspirates are unacceptable for Xpert Xpress SARS-CoV-2/FLU/RSV testing.  Fact Sheet for Patients: EntrepreneurPulse.com.au  Fact Sheet for Healthcare Providers: IncredibleEmployment.be  This test is not yet approved or cleared by the [place] FDA and has been authorized for detection and/or diagnosis of SARS-CoV-2 by FDA under an Emergency Use Authorization (EUA). This EUA will remain in effect (meaning this test can be used) for the duration of the COVID-19 declaration under Section 564(b)(1) of the Act, 21 U.S.C. section 360bbb-3(b)(1), unless the authorization is terminated or revoked.  Performed at [facility], [place] [address] [place], [place] [address]     Please note: You were cared for by a hospitalist during your hospital stay. Once you are discharged, your primary care physician will handle any further medical issues. Please note that NO REFILLS for any discharge medications will be authorized once you are discharged, as it is imperative that you return to your  primary care physician (or establish a relationship with a primary care physician if you do not have one) for your post hospital discharge needs so that they can reassess your need for medications and monitor your lab values.    Time coordinating discharge: 40 minutes  SIGNED:   Shelly Coss, MD  [facility] 05/28/2020, 3:04 PM  Pager [phone]  If 7PM-7AM, please contact night-coverage [email or web address] Password [email or web address]

## 2020-05-29 LAB — GLUCOSE, CAPILLARY
Glucose-Capillary: 100 mg/dL — ABNORMAL HIGH (ref 70–99)
Glucose-Capillary: 106 mg/dL — ABNORMAL HIGH (ref 70–99)
Glucose-Capillary: 128 mg/dL — ABNORMAL HIGH (ref 70–99)
Glucose-Capillary: 129 mg/dL — ABNORMAL HIGH (ref 70–99)
Glucose-Capillary: 96 mg/dL (ref 70–99)
Glucose-Capillary: 97 mg/dL (ref 70–99)

## 2020-05-29 NOTE — Care Management Important Message (Signed)
Important Message  Patient Details  Name: [name] MRN: [id number] Date of Birth: Apr 05, 1921   Medicare Important Message Given:  Yes     Orbie Pyo 05/29/2020, 1:55 PM

## 2020-05-29 NOTE — Progress Notes (Signed)
PROGRESS NOTE    [name]  NLZ:[id number] DOB: 1920/04/23 DOA: 05/26/2020 PCP: Holland [facility], FNP   Brief Narrative: 87 year old with past medical history significant for coronary artery disease a status post PCI, chronic A. fib, on Eliquis history of PE, GERD, hypertension, hyperlipidemia who presented to the emergency department with complaints of weakness, confusion and flank pain.  Patient is from an assisted living facility.  On presentation she was found to be in A. fib with RVR.  Blood pressure was low.  She was given IV fluids in the ED which help with her blood pressure.  Initiated on Cardizem drip for A. fib RVR.  Currently her rate is controlled on medication.  There was some concern with confusion from the family and MRI did not show any acute intracranial abnormalities.   Assessment & Plan:   Principal Problem:   Rapid atrial fibrillation (HCC) Active Problems:   HYPERTENSION, BENIGN   GERD   CAD (coronary artery disease)   Hypothyroidism   Chronic diastolic CHF (congestive heart failure) (HCC)   1-Permanent A fib RVR: -Initially treated with Cardizem drip.  Heart rate has been controlled.  Continue with Eliquis.  Echo ejection fraction 60 to 65%. Continue with metoprolol. -Currently on oral Cardizem  2-CAD: Troponin negative. 3-hypertension: on cardizem. 4-hypothyroidism: Continue with Synthroid 5-chronic diastolic congestive heart failure: CT abdomen and pelvis showed bilateral pleural effusion right greater than left.  She was a started on low-dose Lexis.  6-Macrocytosis on B12 supplement 7-Hyperlipidemia: Continue with statins 8-Thrombocytopenia mild monitor. Resolved.  9-Memory issues/dementia: There was concern for worsening confusion on admission.  CT head MRI of the brain did not show any acute intracranial abnormality.  Continue supportive care  Deconditioning: Reside  at [place].  Insurance denied SNF placement.  They recommend the patient  need long-term care. Plan to transfer to ALF  with PT OT.     Estimated body mass index is 20.91 kg/m as calculated from the following:   Height as of 02/10/20: 5\' 5"  (1.651 m).   Weight as of this encounter: 57 kg.   DVT prophylaxis: Eliquis Code Status: DNR Family Communication: Disposition Plan:  Status is: Inpatient  Remains inpatient appropriate because:Unsafe d/c plan   Dispo: The patient is from: ALF              Anticipated d/c is to: ALF              Anticipated d/c date is: 1 day              Patient currently is medically stable to d/c.   Difficult to place patient No        Consultants:   None  Procedures:   ECHO  Antimicrobials:  None  Subjective: She is feeling tired.  Denies worsening dyspnea.  Report mild side abdomina; pain . She has had BM   Objective: Vitals:   05/29/20 1200 05/29/20 1243 05/29/20 1619 05/29/20 1629  BP: 95/72 104/88  100/68  Pulse:  96    Resp: 19  19   Temp: (!) 96.1 F (35.6 C)   (!) 97.3 F (36.3 C)  TempSrc: Axillary   Axillary  SpO2:      Weight:        Intake/Output Summary (Last 24 hours) at 05/29/2020 1717 Last data filed at 05/29/2020 1219 Gross per 24 hour  Intake 140 ml  Output 500 ml  Net -360 ml   Filed Weights   05/26/20 2255  Weight: 57 kg    Examination:  General exam: Appears calm and comfortable  Respiratory system: Clear to auscultation.  Cardiovascular system: S1 & S2 heard, RRR. No JVD, murmurs, rubs, gallops or clicks. No pedal edema. Gastrointestinal system: Abdomen is nondistended, soft and nontender. No organomegaly or masses felt. Normal bowel sounds heard. Central nervous system: Alert and oriented.  Extremities: Symmetric 5 x 5 power.     Data Reviewed: I have personally reviewed following labs and imaging studies  CBC: Recent Labs  Lab 05/26/20 1456 05/26/20 1506 05/27/20 0054 05/28/20 0039  WBC 6.6  --  8.2 9.5  NEUTROABS 3.0  --   --  5.8  HGB 12.4 13.6  13.6 13.1  HCT 39.1 40.0 42.7 39.9  MCV 106.3*  --  103.9* 103.4*  PLT 156  --  147* 854   Basic Metabolic Panel: Recent Labs  Lab 05/26/20 1456 05/26/20 1506 05/27/20 0054 05/28/20 0039  NA 141 141 140 141  K 4.7 4.6 4.1 3.9  CL 107 106 107 103  CO2 25  --  24 29  GLUCOSE 106* 101* 99 99  BUN 11 13 9  7*  CREATININE 0.94 0.90 0.76 0.89  CALCIUM 8.6*  --  8.8* 9.2   GFR: Estimated Creatinine Clearance: 32.5 mL/min (by C-G formula based on SCr of 0.89 mg/dL). Liver Function Tests: Recent Labs  Lab 05/26/20 1456  AST 29  ALT 15  ALKPHOS 45  BILITOT 1.1  PROT 5.3*  ALBUMIN 2.8*   No results for input(s): LIPASE, AMYLASE in the last 168 hours. Recent Labs  Lab 05/27/20 1838  AMMONIA 15   Coagulation Profile: Recent Labs  Lab 05/26/20 1456  INR 1.4*   Cardiac Enzymes: No results for input(s): CKTOTAL, CKMB, CKMBINDEX, TROPONINI in the last 168 hours. BNP (last 3 results) No results for input(s): PROBNP in the last 8760 hours. HbA1C: No results for input(s): HGBA1C in the last 72 hours. CBG: Recent Labs  Lab 05/28/20 2334 05/29/20 0356 05/29/20 0809 05/29/20 1326 05/29/20 1635  GLUCAP 95 100* 97 129* 96   Lipid Profile: No results for input(s): CHOL, HDL, LDLCALC, TRIG, CHOLHDL, LDLDIRECT in the last 72 hours. Thyroid Function Tests: Recent Labs    05/27/20 1546  TSH 0.532   Anemia Panel: Recent Labs    05/27/20 1546  VITAMINB12 5,874*  FOLATE 17.2   Sepsis Labs: Recent Labs  Lab 05/26/20 1456  LATICACIDVEN 1.5    Recent Results (from the past 240 hour(s))  Blood culture (routine x 2)     Status: None (Preliminary result)   Collection Time: 05/26/20  3:56 PM   Specimen: BLOOD RIGHT ARM  Result Value Ref Range Status   Specimen Description BLOOD RIGHT ARM  Final   Special Requests   Final    BOTTLES DRAWN AEROBIC AND ANAEROBIC Blood Culture adequate volume   Culture   Final    NO GROWTH 3 DAYS Performed at [facility],  [place] [address], [place], [place] [address]    Report Status PENDING  Incomplete  Resp Panel by RT-PCR (Flu A&B, Covid) Nasopharyngeal Swab     Status: None   Collection Time: 05/26/20  4:14 PM   Specimen: Nasopharyngeal Swab; Nasopharyngeal(NP) swabs in vial transport medium  Result Value Ref Range Status   SARS Coronavirus 2 by RT PCR NEGATIVE NEGATIVE Final    Comment: (NOTE) SARS-CoV-2 target nucleic acids are NOT DETECTED.  The SARS-CoV-2 RNA is generally detectable in upper respiratory specimens  during the acute phase of infection. The lowest concentration of SARS-CoV-2 viral copies this assay can detect is 138 copies/mL. A negative result does not preclude SARS-Cov-2 infection and should not be used as the sole basis for treatment or other patient management decisions. A negative result may occur with  improper specimen collection/handling, submission of specimen other than nasopharyngeal swab, presence of viral mutation(s) within the areas targeted by this assay, and inadequate number of viral copies(<138 copies/mL). A negative result must be combined with clinical observations, patient history, and epidemiological information. The expected result is Negative.  Fact Sheet for Patients:  EntrepreneurPulse.com.au  Fact Sheet for Healthcare Providers:  IncredibleEmployment.be  This test is no t yet approved or cleared by the [place] FDA and  has been authorized for detection and/or diagnosis of SARS-CoV-2 by FDA under an Emergency Use Authorization (EUA). This EUA will remain  in effect (meaning this test can be used) for the duration of the COVID-19 declaration under Section 564(b)(1) of the Act, 21 U.S.C.section 360bbb-3(b)(1), unless the authorization is terminated  or revoked sooner.       Influenza A by PCR NEGATIVE NEGATIVE Final   Influenza B by PCR NEGATIVE NEGATIVE Final    Comment: (NOTE) The Xpert Xpress SARS-CoV-2/FLU/RSV  plus assay is intended as an aid in the diagnosis of influenza from Nasopharyngeal swab specimens and should not be used as a sole basis for treatment. Nasal washings and aspirates are unacceptable for Xpert Xpress SARS-CoV-2/FLU/RSV testing.  Fact Sheet for Patients: EntrepreneurPulse.com.au  Fact Sheet for Healthcare Providers: IncredibleEmployment.be  This test is not yet approved or cleared by the [place] FDA and has been authorized for detection and/or diagnosis of SARS-CoV-2 by FDA under an Emergency Use Authorization (EUA). This EUA will remain in effect (meaning this test can be used) for the duration of the COVID-19 declaration under Section 564(b)(1) of the Act, 21 U.S.C. section 360bbb-3(b)(1), unless the authorization is terminated or revoked.  Performed at [facility], [place] [address], [place], [place] [address]   MRSA PCR Screening     Status: None   Collection Time: 05/26/20 11:35 PM   Specimen: Nasal Mucosa; Nasopharyngeal  Result Value Ref Range Status   MRSA by PCR NEGATIVE NEGATIVE Final    Comment:        The GeneXpert MRSA Assay (FDA approved for NASAL specimens only), is one component of a comprehensive MRSA colonization surveillance program. It is not intended to diagnose MRSA infection nor to guide or monitor treatment for MRSA infections. Performed at [facility], [place] [address] [place], [place] [address]   Culture, blood (Routine X 2) w Reflex to ID Panel     Status: None (Preliminary result)   Collection Time: 05/27/20 12:54 AM   Specimen: BLOOD  Result Value Ref Range Status   Specimen Description BLOOD RIGHT HAND  Final   Special Requests   Final    BOTTLES DRAWN AEROBIC ONLY Blood Culture adequate volume   Culture   Final    NO GROWTH 2 DAYS Performed at [facility], [place] [address], [place], [place] [address]    Report Status PENDING  Incomplete  Resp Panel by RT-PCR (Flu  A&B, Covid)     Status: None   Collection Time: 05/28/20 11:10 AM   Specimen: Nasopharyngeal(NP) swabs in vial transport medium  Result Value Ref Range Status   SARS Coronavirus 2 by RT PCR NEGATIVE NEGATIVE Final    Comment: (NOTE)  SARS-CoV-2 target nucleic acids are NOT DETECTED.  The SARS-CoV-2 RNA is generally detectable in upper respiratory specimens during the acute phase of infection. The lowest concentration of SARS-CoV-2 viral copies this assay can detect is 138 copies/mL. A negative result does not preclude SARS-Cov-2 infection and should not be used as the sole basis for treatment or other patient management decisions. A negative result may occur with  improper specimen collection/handling, submission of specimen other than nasopharyngeal swab, presence of viral mutation(s) within the areas targeted by this assay, and inadequate number of viral copies(<138 copies/mL). A negative result must be combined with clinical observations, patient history, and epidemiological information. The expected result is Negative.  Fact Sheet for Patients:  EntrepreneurPulse.com.au  Fact Sheet for Healthcare Providers:  IncredibleEmployment.be  This test is no t yet approved or cleared by the [place] FDA and  has been authorized for detection and/or diagnosis of SARS-CoV-2 by FDA under an Emergency Use Authorization (EUA). This EUA will remain  in effect (meaning this test can be used) for the duration of the COVID-19 declaration under Section 564(b)(1) of the Act, 21 U.S.C.section 360bbb-3(b)(1), unless the authorization is terminated  or revoked sooner.       Influenza A by PCR NEGATIVE NEGATIVE Final   Influenza B by PCR NEGATIVE NEGATIVE Final    Comment: (NOTE) The Xpert Xpress SARS-CoV-2/FLU/RSV plus assay is intended as an aid in the diagnosis of influenza from Nasopharyngeal swab specimens and should not be used as a sole basis for  treatment. Nasal washings and aspirates are unacceptable for Xpert Xpress SARS-CoV-2/FLU/RSV testing.  Fact Sheet for Patients: EntrepreneurPulse.com.au  Fact Sheet for Healthcare Providers: IncredibleEmployment.be  This test is not yet approved or cleared by the [place] FDA and has been authorized for detection and/or diagnosis of SARS-CoV-2 by FDA under an Emergency Use Authorization (EUA). This EUA will remain in effect (meaning this test can be used) for the duration of the COVID-19 declaration under Section 564(b)(1) of the Act, 21 U.S.C. section 360bbb-3(b)(1), unless the authorization is terminated or revoked.  Performed at [facility], [place] [address] [place], [place] [address]          Radiology Studies: MR BRAIN WO CONTRAST  Result Date: 05/27/2020 CLINICAL DATA:  Delirium. EXAM: MRI HEAD WITHOUT CONTRAST TECHNIQUE: Multiplanar, multiecho pulse sequences of the brain and surrounding structures were obtained without intravenous contrast. COMPARISON:  Prior head CT examinations 05/26/2020 and earlier. FINDINGS: Brain: The examination is intermittently motion degraded. Most notably, there is mild-to-moderate motion degradation of the axial T2/FLAIR sequence, mild-to-moderate motion degradation of the axial SWI sequence, mild-to-moderate motion degradation of the axial T1 weighted sequence and moderate motion degradation of the coronal T2 weighted sequence. Moderate cerebral atrophy. Commensurate prominence of the lateral and third ventricles. Comparatively mild cerebellar atrophy. Advanced patchy and confluent T2/FLAIR hyperintensity within the cerebral white matter is nonspecific, but compatible with chronic small vessel ischemic disease. Chronic lacunar infarcts within the cerebral white matter, bilateral basal ganglia, bilateral thalami and bilateral cerebellar hemispheres. There is no acute infarct. No evidence of intracranial  mass. No chronic intracranial blood products. No extra-axial fluid collection. No midline shift. Vascular: Expected proximal arterial flow voids. Skull and upper cervical spine: No focal marrow lesion. Incompletely assessed cervical spondylosis Sinuses/Orbits: Visualized orbits show no acute finding. Prior bilateral lens replacement. Trace ethmoid and right sphenoid sinus mucosal thickening. IMPRESSION: Intermittently motion degraded examination, as described and limiting evaluation. The diffusion-weighted imaging is of good quality. No  evidence of acute infarct. No acute intracranial abnormality is identified. Moderate cerebral atrophy with comparatively mild cerebellar atrophy. Chronic lacunar infarcts within the cerebral white matter, bilateral basal ganglia, bilateral thalami and bilateral cerebellar hemispheres. Background advanced cerebral white matter chronic small vessel ischemic disease. Electronically Signed   By: Kellie Simmering DO   On: 05/27/2020 17:59        Scheduled Meds: . acetaminophen  500 mg Oral Q8H  . apixaban  5 mg Oral BID  . atorvastatin  10 mg Oral Daily  . diltiazem  60 mg Oral Q8H  . furosemide  20 mg Oral Daily  . levothyroxine  100 mcg Oral Daily  . metoprolol tartrate  100 mg Oral BID  . mirabegron ER  50 mg Oral QPM  . multivitamin with minerals  1 tablet Oral QPM  . trimethoprim  100 mg Oral Daily  . vitamin B-12  500 mcg Oral Daily   Continuous Infusions:   LOS: 3 days    Time spent: 35 minutes.,     Elmarie Shiley, MD [facility]   If 7PM-7AM, please contact night-coverage [email or web address]  05/29/2020, 5:17 PM

## 2020-05-29 NOTE — TOC Progression Note (Addendum)
Transition of Care [facility]) - Progression Note    Patient Details  Name: [name] MRN: [id number] Date of Birth: 1921-01-10  Transition of Care [facility]) CM/SW [place], [place] Phone Number: 05/29/2020, 9:49 AM  Clinical Narrative:     CSW received voicemail from [employer]. Auth for ambulance transport is  Approved; auth # [id number]. Josem Kaufmann is good for 90 days from 05/28/20. SNF authorization is denied; "does not appear to meet medical necessity." A peer to peer is offered with dr. Lynder Parents. [phone]. Peer review must be done before 330p today.   1330: CSW informed that peer to peer was completed and steven evans still denied snf auth. Lynder Parents is recommending long term care for pt which health team advantage does not cover. Pt does not have insurance coverage for long term care in a SNF. CSW called pt son who was understandably  Upset as the family's plan was for pt to receive rehab and return to ALF. CSW explained that options would include paying out of pocket for SNF, taking pt home with private care givers, or seeing if [facility] would accept pt back. Son will discuss further with family and is also wants to see what [facility] can offer pt. CSW called Jacquelin Hawking [phone] at [facility]; no answer left voicemail.  1400: CSW called pt son [name] to update. [name] was at [facility] speaking with Scientist, physiological. CSW was put on speaker phone and administrator said pt could return tomorrow if pt is 1 person assist. PT is going to see pt again today. Pt would need ambulance transport to [facility] at time of d/c.   Expected Discharge Plan: [place] Barriers to Discharge: Continued Medical Work up  Expected Discharge Plan and Services Expected Discharge Plan: [place] arrangements for the past 2 months: [place] Expected Discharge Date: 05/28/20                                     Social  Determinants of Health (SDOH) Interventions    Readmission Risk Interventions No flowsheet data found.

## 2020-05-29 NOTE — Progress Notes (Signed)
Physical Therapy Treatment Patient Details Name: [name] MRN: [id number] DOB: 11-30-1920 Today's Date: 05/29/2020    History of Present Illness Pt is a 87 y/o female admitted from ALF secondary to weakness and confusion. Found to have a fib, bilateral pleural effusions and hypotension. PMH includes HTN, CAD s/p PCI, dCHF, and a fib.    PT Comments    Pt is one person assist to mobilize around her hospital room with RW to [facility] across the room.  Min assist overall.  Pt is not safe to get up without assistance due to increased weakness compared to baseline and decreased awareness of safety (thinks she can still do it by herself).  I recommend increased services at ALF if able including HHPT, OT, daily aide (if able), bed alarm pad--as she needs to be assisted until she gets her strength back and will get up on her own).  If ALF can provide temporary increase in her level of care she can return.  PT will continue to follow acutely for safe mobility progression.   Follow Up Recommendations  Other (comment);Home health PT (return to ALF with increased services, HHPT, OT, increased aide services if able to daily.)     Equipment Recommendations  Other (comment);Wheelchair (measurements PT);Wheelchair cushion (measurements PT) (likely provided by ALF as needed, has hospital bed.)    Recommendations for Other Services       Precautions / Restrictions Precautions Precautions: Fall    Mobility  Bed Mobility Overal bed mobility: Needs Assistance Bed Mobility: Supine to Sit;Sit to Supine     Supine to sit: Mod assist (I only think this was mod because I woke her up and her eyes were still closed) Sit to supine: Supervision   General bed mobility comments: Mod assist to come up to sitting as pt was a bit sleepy (PT woke her up) but once sitting supervision EOB.  Also, only supervision to return to supine, able to lift both legs into bed, used rail for leverage at trunk.     Transfers Overall transfer level: Needs assistance Equipment used: Rolling walker (2 wheeled) Transfers: Sit to/from Stand Sit to Stand: Min assist         General transfer comment: Min assist to come to standing from bed and from [facility] with arm rests, mod assist to control descent to sit to [facility], but min assist back to bed.  Ambulation/Gait Ambulation/Gait assistance: Min assist Gait Distance (Feet): 8 Feet Assistive device: Rolling walker (2 wheeled) Gait Pattern/deviations: Step-through pattern;Shuffle;Trunk flexed     General Gait Details: Flexed trunk, shuffling gait, uses a rollator at baseline, so likely pushes this forward of herself as well.  Min assist for balance at trunk and to help steer more difficult standard RW.   Stairs             Wheelchair Mobility    Modified Rankin (Stroke Patients Only)       Balance Overall balance assessment: Needs assistance Sitting-balance support: Feet supported;Bilateral upper extremity supported Sitting balance-Leahy Scale: Fair     Standing balance support: Bilateral upper extremity supported Standing balance-Leahy Scale: Poor                              Cognition Arousal/Alertness: Awake/alert Behavior During Therapy: Impulsive Overall Cognitive Status: Impaired/Different from baseline Area of Impairment: Orientation;Memory;Awareness                 Orientation Level: Situation  Current Attention Level: Sustained Memory: Decreased recall of precautions;Decreased short-term memory Following Commands: Follows one step commands with increased time Safety/Judgement: Decreased awareness of safety;Decreased awareness of deficits Awareness: Emergent Problem Solving: Slow processing General Comments: pt with poor safety awareness, she can report she is weak, but still says it would be safe for her to get up and go to the bathroom on her own (she is not, needs assist).      Exercises       General Comments General comments (skin integrity, edema, etc.): Pt able to preform own peri care in sitting on BSC. HR while sleeping in the upper 40s, while walking in the 50s.      Pertinent Vitals/Pain Pain Assessment: Faces Faces Pain Scale: Hurts little more Pain Location: buttocks    Home Living                      Prior Function            PT Goals (current goals can now be found in the care plan section) Acute Rehab PT Goals Patient Stated Goal: To get better, go home Progress towards PT goals: Progressing toward goals    Frequency    Min 3X/week      PT Plan Discharge plan needs to be updated    Co-evaluation              AM-PAC PT "6 Clicks" Mobility   Outcome Measure  Help needed turning from your back to your side while in a flat bed without using bedrails?: A Little Help needed moving from lying on your back to sitting on the side of a flat bed without using bedrails?: A Lot Help needed moving to and from a bed to a chair (including a wheelchair)?: A Little Help needed standing up from a chair using your arms (e.g., wheelchair or bedside chair)?: A Lot Help needed to walk in hospital room?: A Little Help needed climbing 3-5 steps with a railing? : Total 6 Click Score: 14    End of Session   Activity Tolerance: Patient tolerated treatment well Patient left: in bed;with call bell/phone within reach;with family/visitor present Nurse Communication: Mobility status PT Visit Diagnosis: Difficulty in walking, not elsewhere classified (R26.2);Muscle weakness (generalized) (M62.81);Unsteadiness on feet (R26.81)     Time: 1426-1500 PT Time Calculation (min) (ACUTE ONLY): 34 min  Charges:  $Therapeutic Activity: 23-37 mins                     Verdene Lennert, PT, DPT  Acute Rehabilitation [phone] pager [phone]) [phone] office

## 2020-05-29 NOTE — Plan of Care (Signed)

## 2020-05-30 ENCOUNTER — Inpatient Hospital Stay (HOSPITAL_COMMUNITY): Payer: PPO

## 2020-05-30 LAB — GLUCOSE, CAPILLARY
Glucose-Capillary: 80 mg/dL (ref 70–99)
Glucose-Capillary: 103 mg/dL — ABNORMAL HIGH (ref 70–99)
Glucose-Capillary: 103 mg/dL — ABNORMAL HIGH (ref 70–99)
Glucose-Capillary: 114 mg/dL — ABNORMAL HIGH (ref 70–99)
Glucose-Capillary: 119 mg/dL — ABNORMAL HIGH (ref 70–99)

## 2020-05-30 LAB — BASIC METABOLIC PANEL
Anion gap: 14 (ref 5–15)
BUN: 10 mg/dL (ref 8–23)
CO2: 25 mmol/L (ref 22–32)
Calcium: 8.6 mg/dL — ABNORMAL LOW (ref 8.9–10.3)
Chloride: 98 mmol/L (ref 98–111)
Creatinine, Ser: 1.08 mg/dL — ABNORMAL HIGH (ref 0.44–1.00)
GFR, Estimated: 47 mL/min — ABNORMAL LOW (ref >60)
Glucose, Bld: 90 mg/dL (ref 70–99)
Potassium: 3.7 mmol/L (ref 3.5–5.1)
Sodium: 137 mmol/L (ref 135–145)

## 2020-05-30 MED ORDER — ACETAMINOPHEN 160 MG/5ML PO SOLN: 650.0000 mg | ORAL | Status: DC | PRN

## 2020-05-30 MED ORDER — ACETAMINOPHEN 160 MG/5ML PO SOLN
500.0000 mg | Freq: Three times a day (TID) | ORAL | Status: DC
  Administered 2020-05-30 – 2020-06-01 (×7): 500 mg via ORAL
  Filled 2020-05-30 (×7): qty 20.3

## 2020-05-30 NOTE — Plan of Care (Signed)

## 2020-05-30 NOTE — Progress Notes (Signed)
PROGRESS NOTE    [name]  PIR:[id number] DOB: June 08, 1920 DOA: 05/26/2020 PCP: Holland Commons, FNP   Brief Narrative: 87 year old with past medical history significant for coronary artery disease a status post PCI, chronic A. fib, on Eliquis history of PE, GERD, hypertension, hyperlipidemia who presented to the emergency department with complaints of weakness, confusion and flank pain.  Patient is from an assisted living facility.  On presentation she was found to be in A. fib with RVR.  Blood pressure was low.  She was given IV fluids in the ED which help with her blood pressure.  Initiated on Cardizem drip for A. fib RVR.  Currently her rate is controlled on medication.  There was some concern with confusion from the family and MRI did not show any acute intracranial abnormalities.  Patient report dyspnea. chest x ray with stable pleural effusion. Will continue with oral lasix. Discussed with son, patient has been declining since Nov 2021. She has not being eating well. Plan to consult palliative care for Goals of care.    Assessment & Plan:   Principal Problem:   Rapid atrial fibrillation (HCC) Active Problems:   HYPERTENSION, BENIGN   GERD   CAD (coronary artery disease)   Hypothyroidism   Chronic diastolic CHF (congestive heart failure) (HCC)   1-Permanent A fib RVR: -Initially treated with Cardizem drip.  Heart rate has been controlled.  Continue with Eliquis.  Echo ejection fraction 60 to 65%. -Continue with metoprolol. -Currently on oral Cardizem -rate controlled.   2-CAD: Troponin negative. 3-hypertension: on cardizem. 4-hypothyroidism: Continue with Synthroid 5-Chronic diastolic congestive heart failure: CT abdomen and pelvis showed bilateral pleural effusion right greater than left.  She was a started on low-dose Lasix.  -report SOB, place on 2 L of oxygen report improvement. Chest x ray with stable BL pleural effusion. Will continue with low dose lasix,  patient with poor oral inatke.   6-Macrocytosis on B12 supplement 7-Hyperlipidemia: Continue with statins 8-Thrombocytopenia mild monitor. Resolved.  9-Memory issues/dementia: There was concern for worsening confusion on admission.  CT head MRI of the brain did not show any acute intracranial abnormality.  Continue supportive care  Deconditioning: Reside  at [place].  Insurance denied SNF placement.  They recommend the patient need long-term care. Plan to transfer to ALF  with PT OT. -Not stable to be discharge today, patient high risk for readmission. She has been declining since Nov 2021. Palliative care consulted for goals of care.      Estimated body mass index is 20.91 kg/m as calculated from the following:   Height as of 02/10/20: 5\' 5"  (1.651 m).   Weight as of this encounter: 57 kg.   DVT prophylaxis: Eliquis Code Status: DNR Family Communication: Son over phone.  Disposition Plan:  Status is: Inpatient  Remains inpatient appropriate because:Unsafe d/c plan   Dispo: The patient is from: ALF              Anticipated d/c is to: ALF              Anticipated d/c date is: 1 day              Patient currently is medically stable to d/c.   Difficult to place patient No        Consultants:   None  Procedures:   ECHO  Antimicrobials:  None  Subjective: She is very weak. She report Dyspnea. She was place on 2 L oxygen and report some improvement.  She has not being eating.   Objective: Vitals:   05/29/20 2300 05/30/20 0300 05/30/20 0800 05/30/20 1105  BP: (!) 105/56 116/78 (!) 107/56 125/75  Pulse: 81 85 74 96  Resp: 16 13 18 17   Temp: (!) 97.3 F (36.3 C) (!) 97.4 F (36.3 C) 97.8 F (36.6 C) 97.6 F (36.4 C)  TempSrc: Oral Oral Oral Oral  SpO2: 94% 93% 92% 93%  Weight:        Intake/Output Summary (Last 24 hours) at 05/30/2020 1144 Last data filed at 05/30/2020 1039 Gross per 24 hour  Intake 120 ml  Output 350 ml  Net -230 ml   Filed  Weights   05/26/20 2255  Weight: 57 kg    Examination:  General exam: generalized weak, Chronic ill appearing Respiratory system: crackles bases  Cardiovascular system: S 1, S 2 RRR Gastrointestinal system: BS present, soft, nt Central nervous system: alert  Extremities: symmetric power     Data Reviewed: I have personally reviewed following labs and imaging studies  CBC: Recent Labs  Lab 05/26/20 1456 05/26/20 1506 05/27/20 0054 05/28/20 0039  WBC 6.6  --  8.2 9.5  NEUTROABS 3.0  --   --  5.8  HGB 12.4 13.6 13.6 13.1  HCT 39.1 40.0 42.7 39.9  MCV 106.3*  --  103.9* 103.4*  PLT 156  --  147* 735   Basic Metabolic Panel: Recent Labs  Lab 05/26/20 1456 05/26/20 1506 05/27/20 0054 05/28/20 0039 05/30/20 0041  NA 141 141 140 141 137  K 4.7 4.6 4.1 3.9 3.7  CL 107 106 107 103 98  CO2 25  --  24 29 25   GLUCOSE 106* 101* 99 99 90  BUN 11 13 9  7* 10  CREATININE 0.94 0.90 0.76 0.89 1.08*  CALCIUM 8.6*  --  8.8* 9.2 8.6*   GFR: Estimated Creatinine Clearance: 26.8 mL/min (A) (by C-G formula based on SCr of 1.08 mg/dL (H)). Liver Function Tests: Recent Labs  Lab 05/26/20 1456  AST 29  ALT 15  ALKPHOS 45  BILITOT 1.1  PROT 5.3*  ALBUMIN 2.8*   No results for input(s): LIPASE, AMYLASE in the last 168 hours. Recent Labs  Lab 05/27/20 1838  AMMONIA 15   Coagulation Profile: Recent Labs  Lab 05/26/20 1456  INR 1.4*   Cardiac Enzymes: No results for input(s): CKTOTAL, CKMB, CKMBINDEX, TROPONINI in the last 168 hours. BNP (last 3 results) No results for input(s): PROBNP in the last 8760 hours. HbA1C: No results for input(s): HGBA1C in the last 72 hours. CBG: Recent Labs  Lab 05/29/20 2001 05/29/20 2336 05/30/20 0357 05/30/20 0812 05/30/20 1104  GLUCAP 128* 106* 80 103* 103*   Lipid Profile: No results for input(s): CHOL, HDL, LDLCALC, TRIG, CHOLHDL, LDLDIRECT in the last 72 hours. Thyroid Function Tests: Recent Labs    05/27/20 1546  TSH  0.532   Anemia Panel: Recent Labs    05/27/20 1546  VITAMINB12 5,874*  FOLATE 17.2   Sepsis Labs: Recent Labs  Lab 05/26/20 1456  LATICACIDVEN 1.5    Recent Results (from the past 240 hour(s))  Blood culture (routine x 2)     Status: None (Preliminary result)   Collection Time: 05/26/20  3:56 PM   Specimen: BLOOD RIGHT ARM  Result Value Ref Range Status   Specimen Description BLOOD RIGHT ARM  Final   Special Requests   Final    BOTTLES DRAWN AEROBIC AND ANAEROBIC Blood Culture adequate volume   Culture  Final    NO GROWTH 4 DAYS Performed at [facility], [place] [address], [place], [place] [address]    Report Status PENDING  Incomplete  Resp Panel by RT-PCR (Flu A&B, Covid) Nasopharyngeal Swab     Status: None   Collection Time: 05/26/20  4:14 PM   Specimen: Nasopharyngeal Swab; Nasopharyngeal(NP) swabs in vial transport medium  Result Value Ref Range Status   SARS Coronavirus 2 by RT PCR NEGATIVE NEGATIVE Final    Comment: (NOTE) SARS-CoV-2 target nucleic acids are NOT DETECTED.  The SARS-CoV-2 RNA is generally detectable in upper respiratory specimens during the acute phase of infection. The lowest concentration of SARS-CoV-2 viral copies this assay can detect is 138 copies/mL. A negative result does not preclude SARS-Cov-2 infection and should not be used as the sole basis for treatment or other patient management decisions. A negative result may occur with  improper specimen collection/handling, submission of specimen other than nasopharyngeal swab, presence of viral mutation(s) within the areas targeted by this assay, and inadequate number of viral copies(<138 copies/mL). A negative result must be combined with clinical observations, patient history, and epidemiological information. The expected result is Negative.  Fact Sheet for Patients:  EntrepreneurPulse.com.au  Fact Sheet for Healthcare Providers:   IncredibleEmployment.be  This test is no t yet approved or cleared by the [place] FDA and  has been authorized for detection and/or diagnosis of SARS-CoV-2 by FDA under an Emergency Use Authorization (EUA). This EUA will remain  in effect (meaning this test can be used) for the duration of the COVID-19 declaration under Section 564(b)(1) of the Act, 21 U.S.C.section 360bbb-3(b)(1), unless the authorization is terminated  or revoked sooner.       Influenza A by PCR NEGATIVE NEGATIVE Final   Influenza B by PCR NEGATIVE NEGATIVE Final    Comment: (NOTE) The Xpert Xpress SARS-CoV-2/FLU/RSV plus assay is intended as an aid in the diagnosis of influenza from Nasopharyngeal swab specimens and should not be used as a sole basis for treatment. Nasal washings and aspirates are unacceptable for Xpert Xpress SARS-CoV-2/FLU/RSV testing.  Fact Sheet for Patients: EntrepreneurPulse.com.au  Fact Sheet for Healthcare Providers: IncredibleEmployment.be  This test is not yet approved or cleared by the [place] FDA and has been authorized for detection and/or diagnosis of SARS-CoV-2 by FDA under an Emergency Use Authorization (EUA). This EUA will remain in effect (meaning this test can be used) for the duration of the COVID-19 declaration under Section 564(b)(1) of the Act, 21 U.S.C. section 360bbb-3(b)(1), unless the authorization is terminated or revoked.  Performed at [facility], [place] [address], [place], [place] [address]   MRSA PCR Screening     Status: None   Collection Time: 05/26/20 11:35 PM   Specimen: Nasal Mucosa; Nasopharyngeal  Result Value Ref Range Status   MRSA by PCR NEGATIVE NEGATIVE Final    Comment:        The GeneXpert MRSA Assay (FDA approved for NASAL specimens only), is one component of a comprehensive MRSA colonization surveillance program. It is not intended to diagnose MRSA infection nor  to guide or monitor treatment for MRSA infections. Performed at [facility], [place] [address], [place], [place] [address]   Culture, blood (Routine X 2) w Reflex to ID Panel     Status: None (Preliminary result)   Collection Time: 05/27/20 12:54 AM   Specimen: BLOOD  Result Value Ref Range Status   Specimen Description BLOOD RIGHT HAND  Final  Special Requests   Final    BOTTLES DRAWN AEROBIC ONLY Blood Culture adequate volume   Culture   Final    NO GROWTH 3 DAYS Performed at [facility], [place] [address], [place], [place] [address]    Report Status PENDING  Incomplete  Resp Panel by RT-PCR (Flu A&B, Covid)     Status: None   Collection Time: 05/28/20 11:10 AM   Specimen: Nasopharyngeal(NP) swabs in vial transport medium  Result Value Ref Range Status   SARS Coronavirus 2 by RT PCR NEGATIVE NEGATIVE Final    Comment: (NOTE) SARS-CoV-2 target nucleic acids are NOT DETECTED.  The SARS-CoV-2 RNA is generally detectable in upper respiratory specimens during the acute phase of infection. The lowest concentration of SARS-CoV-2 viral copies this assay can detect is 138 copies/mL. A negative result does not preclude SARS-Cov-2 infection and should not be used as the sole basis for treatment or other patient management decisions. A negative result may occur with  improper specimen collection/handling, submission of specimen other than nasopharyngeal swab, presence of viral mutation(s) within the areas targeted by this assay, and inadequate number of viral copies(<138 copies/mL). A negative result must be combined with clinical observations, patient history, and epidemiological information. The expected result is Negative.  Fact Sheet for Patients:  EntrepreneurPulse.com.au  Fact Sheet for Healthcare Providers:  IncredibleEmployment.be  This test is no t yet approved or cleared by the [place] FDA and  has been authorized for  detection and/or diagnosis of SARS-CoV-2 by FDA under an Emergency Use Authorization (EUA). This EUA will remain  in effect (meaning this test can be used) for the duration of the COVID-19 declaration under Section 564(b)(1) of the Act, 21 U.S.C.section 360bbb-3(b)(1), unless the authorization is terminated  or revoked sooner.       Influenza A by PCR NEGATIVE NEGATIVE Final   Influenza B by PCR NEGATIVE NEGATIVE Final    Comment: (NOTE) The Xpert Xpress SARS-CoV-2/FLU/RSV plus assay is intended as an aid in the diagnosis of influenza from Nasopharyngeal swab specimens and should not be used as a sole basis for treatment. Nasal washings and aspirates are unacceptable for Xpert Xpress SARS-CoV-2/FLU/RSV testing.  Fact Sheet for Patients: EntrepreneurPulse.com.au  Fact Sheet for Healthcare Providers: IncredibleEmployment.be  This test is not yet approved or cleared by the [place] FDA and has been authorized for detection and/or diagnosis of SARS-CoV-2 by FDA under an Emergency Use Authorization (EUA). This EUA will remain in effect (meaning this test can be used) for the duration of the COVID-19 declaration under Section 564(b)(1) of the Act, 21 U.S.C. section 360bbb-3(b)(1), unless the authorization is terminated or revoked.  Performed at [facility], [place] [address] [place], [place] [address]          Radiology Studies: No results found.      Scheduled Meds: . acetaminophen  500 mg Oral Q8H  . apixaban  5 mg Oral BID  . atorvastatin  10 mg Oral Daily  . diltiazem  60 mg Oral Q8H  . furosemide  20 mg Oral Daily  . levothyroxine  100 mcg Oral Daily  . metoprolol tartrate  100 mg Oral BID  . mirabegron ER  50 mg Oral QPM  . multivitamin with minerals  1 tablet Oral QPM  . trimethoprim  100 mg Oral Daily  . vitamin B-12  500 mcg Oral Daily   Continuous Infusions:   LOS: 4 days    Time spent: 35 minutes.,  Elmarie Shiley, MD [facility]   If 7PM-7AM, please contact night-coverage [email or web address]  05/30/2020, 11:44 AM

## 2020-05-31 LAB — CULTURE, BLOOD (ROUTINE X 2)
Culture: NO GROWTH
Special Requests: ADEQUATE

## 2020-05-31 LAB — BASIC METABOLIC PANEL
Anion gap: 8 (ref 5–15)
BUN: 8 mg/dL (ref 8–23)
CO2: 28 mmol/L (ref 22–32)
Calcium: 8.7 mg/dL — ABNORMAL LOW (ref 8.9–10.3)
Chloride: 101 mmol/L (ref 98–111)
Creatinine, Ser: 1.06 mg/dL — ABNORMAL HIGH (ref 0.44–1.00)
GFR, Estimated: 48 mL/min — ABNORMAL LOW (ref >60)
Glucose, Bld: 93 mg/dL (ref 70–99)
Potassium: 3.9 mmol/L (ref 3.5–5.1)
Sodium: 137 mmol/L (ref 135–145)

## 2020-05-31 LAB — CBC
HCT: 43.7 % (ref 36.0–46.0)
Hemoglobin: 14 g/dL (ref 12.0–15.0)
MCH: 32.9 pg (ref 26.0–34.0)
MCHC: 32 g/dL (ref 30.0–36.0)
MCV: 102.8 fL — ABNORMAL HIGH (ref 80.0–100.0)
Platelets: 176 10*3/uL (ref 150–400)
RBC: 4.25 MIL/uL (ref 3.87–5.11)
RDW: 15.6 % — ABNORMAL HIGH (ref 11.5–15.5)
WBC: 7.9 10*3/uL (ref 4.0–10.5)
nRBC: 0 % (ref 0.0–0.2)

## 2020-05-31 LAB — GLUCOSE, CAPILLARY
Glucose-Capillary: 121 mg/dL — ABNORMAL HIGH (ref 70–99)
Glucose-Capillary: 124 mg/dL — ABNORMAL HIGH (ref 70–99)
Glucose-Capillary: 80 mg/dL (ref 70–99)
Glucose-Capillary: 94 mg/dL (ref 70–99)
Glucose-Capillary: 95 mg/dL (ref 70–99)
Glucose-Capillary: 98 mg/dL (ref 70–99)

## 2020-05-31 LAB — CORTISOL-AM, BLOOD: Cortisol - AM: 17.3 ug/dL (ref 6.7–22.6)

## 2020-05-31 NOTE — Progress Notes (Signed)
Pt refusing glucose check. Pt educated about need to monitor glucose level closely and still refused. Will continue to monitor.

## 2020-05-31 NOTE — Plan of Care (Signed)

## 2020-05-31 NOTE — Progress Notes (Signed)
PROGRESS NOTE    [name]  UUV:[id number] DOB: July 08, 1920 DOA: 05/26/2020 PCP: Holland Commons, FNP   Brief Narrative: 87 year old with past medical history significant for coronary artery disease a status post PCI, chronic A. fib, on Eliquis history of PE, GERD, hypertension, hyperlipidemia who presented to the emergency department with complaints of weakness, confusion and flank pain.  Patient is from an assisted living facility.  On presentation she was found to be in A. fib with RVR.  Blood pressure was low.  She was given IV fluids in the ED which help with her blood pressure.  Initiated on Cardizem drip for A. fib RVR.  Currently her rate is controlled on medication.  There was some concern with confusion from the family and MRI did not show any acute intracranial abnormalities.  Patient report dyspnea on 2/19. chest x ray with stable pleural effusion. Will continue with oral lasix. Discussed with son, patient has been declining since Nov 2021. She has not being eating well. Plan to consult palliative care for Goals of care.    Assessment & Plan:   Principal Problem:   Rapid atrial fibrillation (HCC) Active Problems:   HYPERTENSION, BENIGN   GERD   CAD (coronary artery disease)   Hypothyroidism   Chronic diastolic CHF (congestive heart failure) (HCC)   1-Permanent A fib RVR: -Initially treated with Cardizem drip.  Heart rate has been controlled.  Continue with Eliquis.  Echo ejection fraction 60 to 65%. -Continue with metoprolol. -Currently on oral Cardizem -rate controlled.   2-CAD: Troponin negative. 3-Hypertension: on cardizem. 4-hypothyroidism: Continue with Synthroid 5-Chronic diastolic congestive heart failure: CT abdomen and pelvis showed bilateral pleural effusion right greater than left.  She was a started on low-dose Lasix.  -report SOB, place on 2 L of oxygen report improvement. Chest x ray with stable BL pleural effusion. Will continue with low dose  lasix, patient with poor oral inatke.   6-Macrocytosis on B12 supplement 7-Hyperlipidemia: Continue with statins 8-Thrombocytopenia mild monitor. Resolved.  9-Memory issues/dementia: There was concern for worsening confusion on admission.  CT head MRI of the brain did not show any acute intracranial abnormality.  Continue supportive care. -She is more awake today. Per nurse patient ate 1/3 of supper last night. Didn't eat  breakfast today.  -Awaiting palliative care evaluation.   Deconditioning: Reside  at [facility].  Insurance denied SNF placement.  They recommend the patient need long-term care. Plan to transfer to ALF  with PT OT. -Not stable to be discharge today, patient high risk for readmission. She has been declining since Nov 2021. Palliative care consulted for goals of care.      Estimated body mass index is 20.91 kg/m as calculated from the following:   Height as of 02/10/20: 5\' 5"  (1.651 m).   Weight as of this encounter: 57 kg.   DVT prophylaxis: Eliquis Code Status: DNR Family Communication: Son over phone 2/19.  Disposition Plan:  Status is: Inpatient  Remains inpatient appropriate because:Unsafe d/c plan   Dispo: The patient is from: ALF              Anticipated d/c is to: ALF              Anticipated d/c date is: 1 day              Patient currently is medically stable to d/c.   Difficult to place patient No        Consultants:   None  Procedures:   ECHO  Antimicrobials:  None  Subjective: She is more awake today.  She is on 2 L oxygen. Poor oral intake.  Awaiting palliative care evaluation.   Objective: Vitals:   05/31/20 0300 05/31/20 0733 05/31/20 0800 05/31/20 1033  BP: 101/67 106/76 (!) 115/54 129/65  Pulse: 92 95 (!) 147 92  Resp: 19 17 19    Temp: 98.1 F (36.7 C) 97.6 F (36.4 C) 98 F (36.7 C)   TempSrc: Oral Oral Oral   SpO2: 92% 99% 92%   Weight:        Intake/Output Summary (Last 24 hours) at 05/31/2020  1300 Last data filed at 05/30/2020 1700 Gross per 24 hour  Intake 50 ml  Output 100 ml  Net -50 ml   Filed Weights   05/26/20 2255  Weight: 57 kg    Examination:  General exam: generalized weak, frail.  Respiratory system: B/L crackles.  Cardiovascular system: S 1, S 2 RRR Gastrointestinal system: BS present, soft, nt Central nervous system: Alert Extremities: no edema     Data Reviewed: I have personally reviewed following labs and imaging studies  CBC: Recent Labs  Lab 05/26/20 1456 05/26/20 1506 05/27/20 0054 05/28/20 0039 05/31/20 0818  WBC 6.6  --  8.2 9.5 7.9  NEUTROABS 3.0  --   --  5.8  --   HGB 12.4 13.6 13.6 13.1 14.0  HCT 39.1 40.0 42.7 39.9 43.7  MCV 106.3*  --  103.9* 103.4* 102.8*  PLT 156  --  147* 180 425   Basic Metabolic Panel: Recent Labs  Lab 05/26/20 1456 05/26/20 1506 05/27/20 0054 05/28/20 0039 05/30/20 0041 05/31/20 0818  NA 141 141 140 141 137 137  K 4.7 4.6 4.1 3.9 3.7 3.9  CL 107 106 107 103 98 101  CO2 25  --  24 29 25 28   GLUCOSE 106* 101* 99 99 90 93  BUN 11 13 9  7* 10 8  CREATININE 0.94 0.90 0.76 0.89 1.08* 1.06*  CALCIUM 8.6*  --  8.8* 9.2 8.6* 8.7*   GFR: Estimated Creatinine Clearance: 27.3 mL/min (A) (by C-G formula based on SCr of 1.06 mg/dL (H)). Liver Function Tests: Recent Labs  Lab 05/26/20 1456  AST 29  ALT 15  ALKPHOS 45  BILITOT 1.1  PROT 5.3*  ALBUMIN 2.8*   No results for input(s): LIPASE, AMYLASE in the last 168 hours. Recent Labs  Lab 05/27/20 1838  AMMONIA 15   Coagulation Profile: Recent Labs  Lab 05/26/20 1456  INR 1.4*   Cardiac Enzymes: No results for input(s): CKTOTAL, CKMB, CKMBINDEX, TROPONINI in the last 168 hours. BNP (last 3 results) No results for input(s): PROBNP in the last 8760 hours. HbA1C: No results for input(s): HGBA1C in the last 72 hours. CBG: Recent Labs  Lab 05/30/20 1606 05/30/20 1958 05/31/20 0315 05/31/20 0753 05/31/20 1143  GLUCAP 114* 119* 80 98  95   Lipid Profile: No results for input(s): CHOL, HDL, LDLCALC, TRIG, CHOLHDL, LDLDIRECT in the last 72 hours. Thyroid Function Tests: No results for input(s): TSH, T4TOTAL, FREET4, T3FREE, THYROIDAB in the last 72 hours. Anemia Panel: No results for input(s): VITAMINB12, FOLATE, FERRITIN, TIBC, IRON, RETICCTPCT in the last 72 hours. Sepsis Labs: Recent Labs  Lab 05/26/20 1456  LATICACIDVEN 1.5    Recent Results (from the past 240 hour(s))  Blood culture (routine x 2)     Status: None   Collection Time: 05/26/20  3:56 PM   Specimen: BLOOD RIGHT ARM  Result Value Ref Range Status   Specimen Description BLOOD RIGHT ARM  Final   Special Requests   Final    BOTTLES DRAWN AEROBIC AND ANAEROBIC Blood Culture adequate volume   Culture   Final    NO GROWTH 5 DAYS Performed at [facility], [address] [place], [place] [address]    Report Status 05/31/2020 FINAL  Final  Resp Panel by RT-PCR (Flu A&B, Covid) Nasopharyngeal Swab     Status: None   Collection Time: 05/26/20  4:14 PM   Specimen: Nasopharyngeal Swab; Nasopharyngeal(NP) swabs in vial transport medium  Result Value Ref Range Status   SARS Coronavirus 2 by RT PCR NEGATIVE NEGATIVE Final    Comment: (NOTE) SARS-CoV-2 target nucleic acids are NOT DETECTED.  The SARS-CoV-2 RNA is generally detectable in upper respiratory specimens during the acute phase of infection. The lowest concentration of SARS-CoV-2 viral copies this assay can detect is 138 copies/mL. A negative result does not preclude SARS-Cov-2 infection and should not be used as the sole basis for treatment or other patient management decisions. A negative result may occur with  improper specimen collection/handling, submission of specimen other than nasopharyngeal swab, presence of viral mutation(s) within the areas targeted by this assay, and inadequate number of viral copies(<138 copies/mL). A negative result must be combined with clinical  observations, patient history, and epidemiological information. The expected result is Negative.  Fact Sheet for Patients:  EntrepreneurPulse.com.au  Fact Sheet for Healthcare Providers:  IncredibleEmployment.be  This test is no t yet approved or cleared by the [place] FDA and  has been authorized for detection and/or diagnosis of SARS-CoV-2 by FDA under an Emergency Use Authorization (EUA). This EUA will remain  in effect (meaning this test can be used) for the duration of the COVID-19 declaration under Section 564(b)(1) of the Act, 21 U.S.C.section 360bbb-3(b)(1), unless the authorization is terminated  or revoked sooner.       Influenza A by PCR NEGATIVE NEGATIVE Final   Influenza B by PCR NEGATIVE NEGATIVE Final    Comment: (NOTE) The Xpert Xpress SARS-CoV-2/FLU/RSV plus assay is intended as an aid in the diagnosis of influenza from Nasopharyngeal swab specimens and should not be used as a sole basis for treatment. Nasal washings and aspirates are unacceptable for Xpert Xpress SARS-CoV-2/FLU/RSV testing.  Fact Sheet for Patients: EntrepreneurPulse.com.au  Fact Sheet for Healthcare Providers: IncredibleEmployment.be  This test is not yet approved or cleared by the [place] FDA and has been authorized for detection and/or diagnosis of SARS-CoV-2 by FDA under an Emergency Use Authorization (EUA). This EUA will remain in effect (meaning this test can be used) for the duration of the COVID-19 declaration under Section 564(b)(1) of the Act, 21 U.S.C. section 360bbb-3(b)(1), unless the authorization is terminated or revoked.  Performed at [facility], [place] [address], [place], [place] [address]   MRSA PCR Screening     Status: None   Collection Time: 05/26/20 11:35 PM   Specimen: Nasal Mucosa; Nasopharyngeal  Result Value Ref Range Status   MRSA by PCR NEGATIVE NEGATIVE Final     Comment:        The GeneXpert MRSA Assay (FDA approved for NASAL specimens only), is one component of a comprehensive MRSA colonization surveillance program. It is not intended to diagnose MRSA infection nor to guide or monitor treatment for MRSA infections. Performed at [facility], [place] [address], [place], [place] [address]   Culture, blood (Routine X 2)  w Reflex to ID Panel     Status: None (Preliminary result)   Collection Time: 05/27/20 12:54 AM   Specimen: BLOOD  Result Value Ref Range Status   Specimen Description BLOOD RIGHT HAND  Final   Special Requests   Final    BOTTLES DRAWN AEROBIC ONLY Blood Culture adequate volume   Culture   Final    NO GROWTH 4 DAYS Performed at [facility], [address] [place], [place] [address]    Report Status PENDING  Incomplete  Resp Panel by RT-PCR (Flu A&B, Covid)     Status: None   Collection Time: 05/28/20 11:10 AM   Specimen: Nasopharyngeal(NP) swabs in vial transport medium  Result Value Ref Range Status   SARS Coronavirus 2 by RT PCR NEGATIVE NEGATIVE Final    Comment: (NOTE) SARS-CoV-2 target nucleic acids are NOT DETECTED.  The SARS-CoV-2 RNA is generally detectable in upper respiratory specimens during the acute phase of infection. The lowest concentration of SARS-CoV-2 viral copies this assay can detect is 138 copies/mL. A negative result does not preclude SARS-Cov-2 infection and should not be used as the sole basis for treatment or other patient management decisions. A negative result may occur with  improper specimen collection/handling, submission of specimen other than nasopharyngeal swab, presence of viral mutation(s) within the areas targeted by this assay, and inadequate number of viral copies(<138 copies/mL). A negative result must be combined with clinical observations, patient history, and epidemiological information. The expected result is Negative.  Fact Sheet for Patients:   EntrepreneurPulse.com.au  Fact Sheet for Healthcare Providers:  IncredibleEmployment.be  This test is no t yet approved or cleared by the [place] FDA and  has been authorized for detection and/or diagnosis of SARS-CoV-2 by FDA under an Emergency Use Authorization (EUA). This EUA will remain  in effect (meaning this test can be used) for the duration of the COVID-19 declaration under Section 564(b)(1) of the Act, 21 U.S.C.section 360bbb-3(b)(1), unless the authorization is terminated  or revoked sooner.       Influenza A by PCR NEGATIVE NEGATIVE Final   Influenza B by PCR NEGATIVE NEGATIVE Final    Comment: (NOTE) The Xpert Xpress SARS-CoV-2/FLU/RSV plus assay is intended as an aid in the diagnosis of influenza from Nasopharyngeal swab specimens and should not be used as a sole basis for treatment. Nasal washings and aspirates are unacceptable for Xpert Xpress SARS-CoV-2/FLU/RSV testing.  Fact Sheet for Patients: EntrepreneurPulse.com.au  Fact Sheet for Healthcare Providers: IncredibleEmployment.be  This test is not yet approved or cleared by the [place] FDA and has been authorized for detection and/or diagnosis of SARS-CoV-2 by FDA under an Emergency Use Authorization (EUA). This EUA will remain in effect (meaning this test can be used) for the duration of the COVID-19 declaration under Section 564(b)(1) of the Act, 21 U.S.C. section 360bbb-3(b)(1), unless the authorization is terminated or revoked.  Performed at [facility], [place] [address], [place], [place] [address]          Radiology Studies: DG CHEST PORT 1 VIEW  Result Date: 05/30/2020 CLINICAL DATA:  Shortness of breath.  Bilateral lower chest pain. EXAM: PORTABLE CHEST 1 VIEW COMPARISON:  May 26, 2020 FINDINGS: No pneumothorax. The cardiomediastinal silhouette is stable. Bilateral effusions, right greater than left,  with underlying opacities. No other acute abnormalities are identified. IMPRESSION: Continued bilateral pleural effusions, right greater than left, with underlying atelectasis. No other changes. Electronically Signed   By: Dorise Bullion III  M.D   On: 05/30/2020 12:06        Scheduled Meds: . acetaminophen (TYLENOL) oral liquid 160 mg/5 mL  500 mg Oral Q8H  . apixaban  5 mg Oral BID  . atorvastatin  10 mg Oral Daily  . diltiazem  60 mg Oral Q8H  . furosemide  20 mg Oral Daily  . levothyroxine  100 mcg Oral Daily  . metoprolol tartrate  100 mg Oral BID  . mirabegron ER  50 mg Oral QPM  . multivitamin with minerals  1 tablet Oral QPM  . trimethoprim  100 mg Oral Daily  . vitamin B-12  500 mcg Oral Daily   Continuous Infusions:   LOS: 5 days    Time spent: 35 minutes.,     Elmarie Shiley, MD [facility]   If 7PM-7AM, please contact night-coverage [email or web address]  05/31/2020, 1:00 PM

## 2020-06-01 DIAGNOSIS — I5032 Chronic diastolic (congestive) heart failure: Secondary | ICD-10-CM

## 2020-06-01 DIAGNOSIS — K219 Gastro-esophageal reflux disease without esophagitis: Secondary | ICD-10-CM

## 2020-06-01 DIAGNOSIS — Z7189 Other specified counseling: Secondary | ICD-10-CM

## 2020-06-01 DIAGNOSIS — E039 Hypothyroidism, unspecified: Secondary | ICD-10-CM

## 2020-06-01 DIAGNOSIS — Z66 Do not resuscitate: Secondary | ICD-10-CM

## 2020-06-01 DIAGNOSIS — I25119 Atherosclerotic heart disease of native coronary artery with unspecified angina pectoris: Secondary | ICD-10-CM

## 2020-06-01 DIAGNOSIS — I10 Essential (primary) hypertension: Secondary | ICD-10-CM

## 2020-06-01 DIAGNOSIS — Z515 Encounter for palliative care: Secondary | ICD-10-CM

## 2020-06-01 LAB — CULTURE, BLOOD (ROUTINE X 2)
Culture: NO GROWTH
Special Requests: ADEQUATE

## 2020-06-01 LAB — GLUCOSE, CAPILLARY
Glucose-Capillary: 82 mg/dL (ref 70–99)
Glucose-Capillary: 80 mg/dL (ref 70–99)
Glucose-Capillary: 95 mg/dL (ref 70–99)
Glucose-Capillary: 87 mg/dL (ref 70–99)

## 2020-06-01 NOTE — Consult Note (Addendum)
Palliative Medicine Inpatient Consult Note  Reason for consult:  Goals of Care "goal of care, patient has been declining since november, we need better discharge plan, insurance decline SNF,"  HPI:  Per recent hospitalist note--> 87 year old with past medical history significant for coronary artery disease a status post PCI, chronic A. fib, on Eliquis history of PE, GERD, hypertension, hyperlipidemia who presented to the emergency department with complaints of weakness, confusion and flank pain.  Patient is from an assisted living facility.  On presentation she was found to be in A. fib with RVR.  Blood pressure was low.  She was given IV fluids in the ED which help with her blood pressure.  Initiated on Cardizem drip for A. fib RVR.  Currently her rate is controlled on medication.  There was some concern with confusion from the family and MRI did not show any acute intracranial abnormalities.  Patient report dyspnea on 2/19. chest x ray with stable pleural effusion. Will continue with oral lasix. Discussed with son, patient has been declining since Nov 2021. She has not being eating well. Plan to consult palliative care for Goals of care.   [name] had been seen by the Palliative care team in November of 2021. At this time is was determined to provide a trial of rehabilitation though it appears she has been steadily declining since that time.   Clinical Assessment/Goals of Care:  *Please note that this is a verbal dictation therefore any spelling or grammatical errors are due to the "[place] [facility]" system interpretation.  I have reviewed medical records including EPIC notes, labs and imaging, received report from bedside RN, assessed the patient who was in no distress as the time of assessment.    I met with [name] to further discuss diagnosis prognosis, GOC, EOL wishes, disposition and options.   I introduced Palliative Medicine as specialized medical care for people living with  serious illness. It focuses on providing relief from the symptoms and stress of a serious illness. The goal is to improve quality of life for both the patient and the family.  [name] shares with me that she is from [place], [place] originally. She had been living in a single family home in [place] up until last year when she decided to sell it. She is a widow and was wed for over 30 years. She has four children. 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 In regards to her faith she considers herself to be devout and is a Doctor, general practice.  Prior to hospitalization [name] had been living at [facility]. Before that she was living with her son, Lori for a short time.   A detailed discussion was had today regarding advanced directives - patient has none on file though she does share with me that she would wish for her son, Lori to make decisions for her if she were ever unable to make them for herself.  Concepts specific to code status, artifical feeding and hydration, continued IV antibiotics and rehospitalization was had. Prior MOST reviewed as below:  Cardiopulmonary Resuscitation: Do Not Attempt Resuscitation (DNR/No CPR)  Medical Interventions: Comfort Measures: Keep clean, warm, and dry. Use medication by any route, positioning, wound care, and other measures to relieve pain and suffering. Use oxygen, suction and manual treatment of airway obstruction as needed for comfort. Do not transfer to the hospital unless comfort needs cannot be met in current location.  Antibiotics: No antibiotics (use other  measures to relieve symptoms)  IV Fluids: No IV fluids (provide other measures to ensure comfort)  Feeding Tube: No feeding tube   The difference between a aggressive medical intervention path  and a palliative comfort care path for this patient at this time was had. Values and goals of care important to patient and family were attempted to be elicited. I  shared with [name] the concerns about her physical state and lack of tremendous improvements over the past few months. It appears that she does feel that she has some quality within her life and would want to continue as prior to admission.  Discussed the importance of continued conversation with family and their  medical providers regarding overall plan of care and treatment options, ensuring decisions are within the context of the patients values and GOCs. _____________________________________________________________________ Addendum:  I spoke to patients son, [name]. He validated the above information. We reviewed his prior meeting with my colleague Dr. Neale Burly. Lori shares that [name] had gone to [facility] and then to [facility] where she had in all regards been thriving. She had not been eating as well though this has been ongoing for quite sometime. Lori shares that [name] never wants to eat when she is in the hospital. He states that he feels she has function and quality in her life and is not yet at a point where hospice would be considered though he is open to this if her state continued to deteriorate.   Lori vocalizes that his goals for her are to go to rehabilitation (ideally [facility]) and then back to [facility]. It does not seem that insurance has authorized this therefore [name] will transition back to [facility] with OP PT/OT.  I was able to speak to MSW [name] to verify [name] will get OP palliative follow OP with [facility].   Decision Maker: [name] (son) [phone]  SUMMARY OF RECOMMENDATIONS   DNAR/DNI  MOST Completed, paper copy placed onto the chart electric copy can be found in [facility]  DNR Form Completed, paper copy placed onto the chart electric copy can be found in [facility]  TOC - OP Palliative support  Code Status/Advance Care Planning: DNAR/DNI   Palliative Prophylaxis:   Oral Care, Mobility  Additional Recommendations (Limitations, Scope,  Preferences):  Continue current scope of care   Psycho-social/Spiritual:   Desire for further Chaplaincy support: No  Additional Recommendations: Education on chronic Co-Morbidities and medical frailty   Prognosis: Very high 1 year mortality risk based upon frailty  Discharge Planning: Discharge to [facility] with OP PT/OT.  Vitals:   05/31/20 2344 06/01/20 0419  BP: 112/79 106/73  Pulse: 76 83  Resp: 18 17  Temp: (!) 97.4 F (36.3 C) 98 F (36.7 C)  SpO2: 95% 92%    Intake/Output Summary (Last 24 hours) at 06/01/2020 0641 Last data filed at 05/31/2020 1800 Gross per 24 hour  Intake 418 ml  Output 200 ml  Net 218 ml   Last Weight  Most recent update: 05/26/2020 11:24 PM   Weight  57 kg (125 lb 10.6 oz)           Gen:  Frail Caucasian F in NAD HEENT: Dry mucous membranes CV: Regular rate and rhythm  PULM: clear to auscultation bilaterally  ABD: soft/nontender  EXT: No edema  Neuro: Alert and oriented to person, place, time  PPS: 40%   This conversation/these recommendations were discussed with patient primary care team, Dr.Pokhrel  Time In: 0650 Time Out: 0800 Total Time: 70  Greater than 50%  of this time was spent counseling and coordinating care related to the above assessment and plan.  [facility] Team Team Cell Phone: [phone] Please utilize secure chat with additional questions, if there is no response within 30 minutes please call the above phone number  Palliative Medicine Team providers are available by phone from 7am to 7pm daily and can be reached through the team cell phone.  Should this patient require assistance outside of these hours, please call the patient's attending physician.

## 2020-06-01 NOTE — Progress Notes (Signed)
Occupational Therapy Treatment Patient Details Name: MAVEN ROSANDER MRN: [id number] DOB: 04-01-1921 Today's Date: 06/01/2020    History of present illness Pt is a 87 y/o female admitted from ALF secondary to weakness and confusion. Found to have a fib, bilateral pleural effusions and hypotension. PMH includes HTN, CAD s/p PCI, dCHF, and a fib.   OT comments  Pt making steady progress towards OT goals this session.  Pt continues to present with decreased activity tolerance/ standing tolerance, generalized weakenss (BLE>BUES) and generalized deconditioning. Pt currently requires MIN A for household distance functional mobility within pts room with RW, pt needs tactile cues at times to manage RW and follow directional cues when navigating through tight spaces. Pt unable to stand at sink> 30 secs without needing to sit in front of sink for all grooming tasks. Pt would continue to benefit from skilled occupational therapy while admitted and after d/c to address the below listed limitations in order to improve overall functional mobility and facilitate independence with BADL participation. DC plan remains appropriate, will follow acutely per POC.     Follow Up Recommendations  SNF;Supervision/Assistance - 24 hour    Equipment Recommendations  None recommended by OT    Recommendations for Other Services      Precautions / Restrictions Precautions Precautions: Fall Restrictions Weight Bearing Restrictions: No       Mobility Bed Mobility               General bed mobility comments: OOB in recliner and returned to recliner  Transfers Overall transfer level: Needs assistance Equipment used: Rolling walker (2 wheeled) Transfers: Sit to/from [facility] Sit to Stand: Min assist Stand pivot transfers: Min assist       General transfer comment: MIN A to rise and cues for hand placement each trial, MIN A for safety for functional transfers and steadying assist d/t  impaired balance and BLE weakness    Balance Overall balance assessment: Needs assistance Sitting-balance support: Feet supported;No upper extremity supported Sitting balance-Leahy Scale: Fair     Standing balance support: Bilateral upper extremity supported Standing balance-Leahy Scale: Poor Standing balance comment: Reliant on BUE support                           ADL either performed or assessed with clinical judgement   ADL Overall ADL's : Needs assistance/impaired     Grooming: Wash/dry face;Oral care;Brushing hair;Sitting;Set up Grooming Details (indicate cue type and reason): deferrred all standing grooming tasks to sitting d/t decreased activity tolerance and generalized weakness     Lower Body Bathing: Min guard;Sitting/lateral leans Lower Body Bathing Details (indicate cue type and reason): simulated via anterior pericare from [facility] Upper Body Dressing : Minimal assistance;Sitting Upper Body Dressing Details (indicate cue type and reason): to don gown as back side cover     Toilet Transfer: Minimal assistance;RW;Ambulation;Stand-pivot Toilet Transfer Details (indicate cue type and reason): pt completed simulated toilet transfer via functional mobility from recliner to sink with MIN A for safety with RW, but pt also able to complete stand pivot transfer from recliner>BSC with RW and MIN A +1 for safety and balance. pt requried step by step directional cues to navigate RW and in especially in tight places Toileting- Water quality scientist and Hygiene: Min guard;Sitting/lateral lean Toileting - Clothing Manipulation Details (indicate cue type and reason): min guard for safety from [facility]     Functional mobility during ADLs: Minimal assistance;Rolling walker General ADL Comments:  pt continues to present with decreased activity tolerance/ standing tolerance, generalized weakenss (BLE>BUES) and generalized deconditioning     Vision       Perception     Praxis       Cognition Arousal/Alertness: Awake/alert Behavior During Therapy: WFL for tasks assessed/performed Overall Cognitive Status: Impaired/Different from baseline Area of Impairment: Safety/judgement;Problem solving;Memory                     Memory: Decreased short-term memory   Safety/Judgement: Decreased awareness of deficits;Decreased awareness of safety (pt repeatedly states I have trouble with my breathing even though sats [facility])   Problem Solving: Slow processing;Difficulty sequencing;Requires verbal cues;Requires tactile cues General Comments: pt with poor safety awareness and has difficulty sequencing mobility tasks needing tactile cues at times for RW mgmt        Exercises Other Exercises Other Exercises: encouraged pt to work on seated therex ( leg raises, ankle pumps, hip ABD/ADD) from recliner to increase activity tolerance   Shoulder Instructions       General Comments VSS, on RA during session    Pertinent Vitals/ Pain       Pain Assessment: Faces Faces Pain Scale: Hurts a little bit Pain Location: buttocks sitting in chair Pain Descriptors / Indicators: Sore Pain Intervention(s): Repositioned;Other (comment) (placed pillow in chair)  Home Living                                          Prior Functioning/Environment              Frequency  Min 2X/week        Progress Toward Goals  OT Goals(current goals can now be found in the care plan section)  Progress towards OT goals: Progressing toward goals  Acute Rehab OT Goals Patient Stated Goal: To get better, go home OT Goal Formulation: With patient Time For Goal Achievement: 06/10/20 Potential to Achieve Goals: Good  Plan Discharge plan remains appropriate;Frequency remains appropriate    Co-evaluation                 AM-PAC OT "6 Clicks" Daily Activity     Outcome Measure   Help from another person eating meals?: A Little (set- up) Help from another person  taking care of personal grooming?: A Little Help from another person toileting, which includes using toliet, bedpan, or urinal?: A Little Help from another person bathing (including washing, rinsing, drying)?: A Lot Help from another person to put on and taking off regular upper body clothing?: A Little   6 Click Score: 14    End of Session Equipment Utilized During Treatment: Gait belt;Rolling walker;Other (comment) (BSC)  OT Visit Diagnosis: Unsteadiness on feet (R26.81);Other abnormalities of gait and mobility (R26.89);Muscle weakness (generalized) (M62.81);Other symptoms and signs involving cognitive function;Pain Pain - part of body:  (buttock)   Activity Tolerance Patient tolerated treatment well   Patient Left in chair;with call bell/phone within reach;with chair alarm set   Nurse Communication Mobility status        Time: 0240-9735 OT Time Calculation (min): 43 min  Charges: OT General Charges $OT Visit: 1 Visit OT Treatments $Self Care/Home Management : 38-52 mins  Harley Alto., COTA/L Acute Rehabilitation [facility] [place]   Precious Haws 06/01/2020, 10:01 AM

## 2020-06-01 NOTE — Discharge Summary (Signed)
Physician Discharge Summary  OZELL FERRERA SHF:[id number] DOB: [birth year] DOA: 05/26/2020  PCP: Holland Commons, FNP  Admit date: 05/26/2020 Discharge date: 06/01/2020  Admitted From: ALF  Discharge disposition: ALF   Recommendations for Outpatient Follow-Up:   . Follow up with your primary care provider in one week.  . Check CBC, BMP, magnesium in the next visit . Encourage oral nutrition.   Discharge Diagnosis:   Principal Problem:   Rapid atrial fibrillation (HCC) Active Problems:   HYPERTENSION, BENIGN   GERD   CAD (coronary artery disease)   Hypothyroidism   Chronic diastolic CHF (congestive heart failure) ([place])   Discharge Condition: Improved.  Diet recommendation: soft diet, cardiac diet  Wound care: None.  Code status: DNR   History of Present Illness:   Patient is a 87 years old female with past medical history of coronary artery disease status post PCI, chronic A. fib, history of pulmonary embolism on Eliquis, GERD, hypertension, hyperlipidemia presented to the hospital with weakness confusion and flank pain.  Patient was noted to be in atrial fibrillation with RVR with mild hypotension and was initially started on Cardizem drip.  There was some concern regarding confusion as well.  Patient had a chest x-ray which showed stable pleural effusion.  Patient was started on oral Lasix.  Of note patient has had general decline since last year and has not been eating well.  Palliative care was consulted.  At this time patient has been starting to eat better.  She has been considered stable for disposition back to assisted living facility  Hospital Course:   Following conditions were addressed during hospitalization as listed below,  Permanent atrial fibrillation with RVR on presentation.  Was initially on Cardizem drip.  Currently controlled.  Dose of Cardizem has been adjusted.  Continue metoprolol.  2D echocardiogram with left ventricular ejection fraction of  60 to 65%  CAD: No active chest pain.  History of PCI.  Troponins were negative.    Essential hypertension: on cardizem, metoprolol.  Hypothyroidism: Continue with Synthroid  Chronic diastolic congestive heart failure: CT scan showed some pleural effusion.  Was started on low-dose Lasix.  Will be continued on discharge.   Macrocytosis on B12 supplement  Hyperlipidemia: Continue statins.  Mild Thrombocytopenia .  Resolved.  Memory issues/dementia: CT and MRI of the brain did not show any acute findings.  Better at this time.   Deconditioning/debility.   Patient is from  [facility].  Insurance denied SNF placement. Plan to transfer to ALF.   Disposition.  At this time, patient is stable for disposition to assisted living facility.  Spoke with the patient's son on the phone and updated him about the plan for disposition.  Medical Consultants:    None.  Procedures:    None Subjective:   Today, patient seen and examined at bedside.  States that she has eaten some this morning.  Denies any chest pain, shortness of breath, fever, chills or rigor.  Discharge Exam:   Vitals:   06/01/20 0739 06/01/20 1048  BP: 119/84 94/62  Pulse: 98 60  Resp: 16 17  Temp: 97.9 F (36.6 C) 97.6 F (36.4 C)  SpO2: 92% 94%   Vitals:   05/31/20 2344 06/01/20 0419 06/01/20 0739 06/01/20 1048  BP: 112/79 106/73 119/84 94/62  Pulse: 76 83 98 60  Resp: 18 17 16 17   Temp: (!) 97.4 F (36.3 C) 98 F (36.7 C) 97.9 F (36.6 C) 97.6 F (36.4 C)  TempSrc: Oral  Oral Oral Oral  SpO2: 95% 92% 92% 94%  Weight:        General: Alert awake, not in obvious distress built, fairly built, HENT: pupils equally reacting to light,  No scleral pallor or icterus noted. Oral mucosa is moist.  Chest:  Diminished breath sounds bilaterally.  CVS: S1 &S2 heard. No murmur.  Regular rate and rhythm. Abdomen: Soft, nontender, nondistended.  Bowel sounds are heard.   Extremities: No cyanosis, clubbing or  edema.  Peripheral pulses are palpable. Psych: Alert, awake and communicative, normal mood CNS:  No cranial nerve deficits.  Power equal in all extremities.   Skin: Warm and dry.  No rashes noted.  The results of significant diagnostics from this hospitalization (including imaging, microbiology, ancillary and laboratory) are listed below for reference.     Diagnostic Studies:   CT Head Wo Contrast  Result Date: 05/26/2020 CLINICAL DATA:  Mental status change.  Suspected alcohol/drug use. EXAM: CT HEAD WITHOUT CONTRAST TECHNIQUE: Contiguous axial images were obtained from the base of the skull through the vertex without intravenous contrast. COMPARISON:  CT head 02/10/2020. FINDINGS: Brain: There is no evidence of acute intracranial hemorrhage, mass lesion, brain edema or extra-axial fluid collection. There is stable mild atrophy with prominence of the ventricles and subarachnoid spaces. Chronic small vessel ischemic changes in the periventricular white matter, left basal ganglia and left thalamus are unchanged. There is no CT evidence of acute cortical infarction. Vascular: Diffuse intracranial vascular calcifications. No hyperdense vessel identified. Skull: Negative for fracture or focal lesion. Sinuses/Orbits: The visualized paranasal sinuses and mastoid air cells are clear. Postsurgical changes in both globes. Other: None. IMPRESSION: 1. No acute intracranial findings. 2. Stable atrophy and chronic small vessel ischemic changes. Electronically Signed   By: Richardean Sale M.D.   On: 05/26/2020 15:42   MR BRAIN WO CONTRAST  Result Date: 05/27/2020 CLINICAL DATA:  Delirium. EXAM: MRI HEAD WITHOUT CONTRAST TECHNIQUE: Multiplanar, multiecho pulse sequences of the brain and surrounding structures were obtained without intravenous contrast. COMPARISON:  Prior head CT examinations 05/26/2020 and earlier. FINDINGS: Brain: The examination is intermittently motion degraded. Most notably, there is  mild-to-moderate motion degradation of the axial T2/FLAIR sequence, mild-to-moderate motion degradation of the axial SWI sequence, mild-to-moderate motion degradation of the axial T1 weighted sequence and moderate motion degradation of the coronal T2 weighted sequence. Moderate cerebral atrophy. Commensurate prominence of the lateral and third ventricles. Comparatively mild cerebellar atrophy. Advanced patchy and confluent T2/FLAIR hyperintensity within the cerebral white matter is nonspecific, but compatible with chronic small vessel ischemic disease. Chronic lacunar infarcts within the cerebral white matter, bilateral basal ganglia, bilateral thalami and bilateral cerebellar hemispheres. There is no acute infarct. No evidence of intracranial mass. No chronic intracranial blood products. No extra-axial fluid collection. No midline shift. Vascular: Expected proximal arterial flow voids. Skull and upper cervical spine: No focal marrow lesion. Incompletely assessed cervical spondylosis Sinuses/Orbits: Visualized orbits show no acute finding. Prior bilateral lens replacement. Trace ethmoid and right sphenoid sinus mucosal thickening. IMPRESSION: Intermittently motion degraded examination, as described and limiting evaluation. The diffusion-weighted imaging is of good quality. No evidence of acute infarct. No acute intracranial abnormality is identified. Moderate cerebral atrophy with comparatively mild cerebellar atrophy. Chronic lacunar infarcts within the cerebral white matter, bilateral basal ganglia, bilateral thalami and bilateral cerebellar hemispheres. Background advanced cerebral white matter chronic small vessel ischemic disease. Electronically Signed   By: Kellie Simmering DO   On: 05/27/2020 17:59   CT ABDOMEN PELVIS W CONTRAST  Result Date: 05/26/2020 CLINICAL DATA:  Flank pain, calculus EXAM: CT ABDOMEN AND PELVIS WITH CONTRAST TECHNIQUE: Multidetector CT imaging of the abdomen and pelvis was performed  using the standard protocol following bolus administration of intravenous contrast. CONTRAST:  175mL OMNIPAQUE IOHEXOL 350 MG/ML SOLN COMPARISON:  02/10/2020 FINDINGS: Lower chest: Bilateral pleural effusions, right greater than left. Patchy areas of consolidation within the bilateral lower lobes compatible with atelectasis. Prominent biatrial dilatation. No pericardial effusion. Hepatobiliary: Stable scarring and calcification superior right lobe liver just beneath the right hemidiaphragm. Heterogeneous liver attenuation likely due to arterial phase of contrast enhancement. Gallbladder is not identified and may be surgically absent. Pancreas: Unremarkable. No pancreatic ductal dilatation or surrounding inflammatory changes. Spleen: Normal in size without focal abnormality. Adrenals/Urinary Tract: Stable bilateral renal cortical atrophy. Simple cyst upper pole left kidney. No urinary tract calculi or obstructive uropathy. No adrenal masses. Bladder is decompressed, limiting its evaluation. Stomach/Bowel: No bowel obstruction or ileus. Diverticulosis of the sigmoid colon without diverticulitis. No bowel wall thickening or inflammatory change. Vascular/Lymphatic: Aortic atherosclerosis. No enlarged abdominal or pelvic lymph nodes. Reproductive: Status post hysterectomy. No adnexal masses. Other: No free fluid or free gas. No abdominal wall hernia. Musculoskeletal: No acute or destructive bony lesions. Reconstructed images demonstrate no additional findings. IMPRESSION: 1. Bilateral pleural effusions, right greater than left, with bibasilar atelectasis. 2. Sigmoid diverticulosis without diverticulitis. 3. No urinary tract calculi or obstructive uropathy. 4. Aortic Atherosclerosis (ICD10-I70.0). Electronically Signed   By: Randa Ngo M.D.   On: 05/26/2020 19:46   DG Chest Portable 1 View  Result Date: 05/26/2020 CLINICAL DATA:  Weakness, altered mental status, history coronary artery disease, hypertension EXAM:  PORTABLE CHEST 1 VIEW COMPARISON:  Portable exam 1502 hours compared to 02/14/2020 FINDINGS: Upper normal size of cardiac silhouette. Mediastinal contours and pulmonary vascularity normal. Atherosclerotic calcification aorta. Bronchitic changes with bibasilar pleural effusions and atelectasis greater on RIGHT. Remaining lungs clear. No acute infiltrate or pneumothorax. Bones demineralized. IMPRESSION: Bronchitic changes with bibasilar pleural effusions and atelectasis, greater on RIGHT. Aortic Atherosclerosis (ICD10-I70.0). Electronically Signed   By: Lavonia Dana M.D.   On: 05/26/2020 15:35     Labs:   Basic Metabolic Panel: Recent Labs  Lab 05/26/20 1456 05/26/20 1506 05/27/20 0054 05/28/20 0039 05/30/20 0041 05/31/20 0818  NA 141 141 140 141 137 137  K 4.7 4.6 4.1 3.9 3.7 3.9  CL 107 106 107 103 98 101  CO2 25  --  24 29 25 28   GLUCOSE 106* 101* 99 99 90 93  BUN 11 13 9  7* 10 8  CREATININE 0.94 0.90 0.76 0.89 1.08* 1.06*  CALCIUM 8.6*  --  8.8* 9.2 8.6* 8.7*   GFR Estimated Creatinine Clearance: 27.3 mL/min (A) (by C-G formula based on SCr of 1.06 mg/dL (H)). Liver Function Tests: Recent Labs  Lab 05/26/20 1456  AST 29  ALT 15  ALKPHOS 45  BILITOT 1.1  PROT 5.3*  ALBUMIN 2.8*   No results for input(s): LIPASE, AMYLASE in the last 168 hours. Recent Labs  Lab 05/27/20 1838  AMMONIA 15   Coagulation profile Recent Labs  Lab 05/26/20 1456  INR 1.4*    CBC: Recent Labs  Lab 05/26/20 1456 05/26/20 1506 05/27/20 0054 05/28/20 0039 05/31/20 0818  WBC 6.6  --  8.2 9.5 7.9  NEUTROABS 3.0  --   --  5.8  --   HGB 12.4 13.6 13.6 13.1 14.0  HCT 39.1 40.0 42.7 39.9 43.7  MCV 106.3*  --  103.9* 103.4* 102.8*  PLT 156  --  147* 180 176   Cardiac Enzymes: No results for input(s): CKTOTAL, CKMB, CKMBINDEX, TROPONINI in the last 168 hours. BNP: Invalid input(s): POCBNP CBG: Recent Labs  Lab 05/31/20 1947 05/31/20 2343 06/01/20 0421 06/01/20 0834 06/01/20 1107   GLUCAP 121* 94 82 80 95   D-Dimer No results for input(s): DDIMER in the last 72 hours. Hgb A1c No results for input(s): HGBA1C in the last 72 hours. Lipid Profile No results for input(s): CHOL, HDL, LDLCALC, TRIG, CHOLHDL, LDLDIRECT in the last 72 hours. Thyroid function studies No results for input(s): TSH, T4TOTAL, T3FREE, THYROIDAB in the last 72 hours.  Invalid input(s): FREET3 Anemia work up No results for input(s): VITAMINB12, FOLATE, FERRITIN, TIBC, IRON, RETICCTPCT in the last 72 hours. Microbiology Recent Results (from the past 240 hour(s))  Blood culture (routine x 2)     Status: None   Collection Time: 05/26/20  3:56 PM   Specimen: BLOOD RIGHT ARM  Result Value Ref Range Status   Specimen Description BLOOD RIGHT ARM  Final   Special Requests   Final    BOTTLES DRAWN AEROBIC AND ANAEROBIC Blood Culture adequate volume   Culture   Final    NO GROWTH 5 DAYS Performed at [facility], [address] [place], [place] [address]    Report Status 05/31/2020 FINAL  Final  Resp Panel by RT-PCR (Flu A&B, Covid) Nasopharyngeal Swab     Status: None   Collection Time: 05/26/20  4:14 PM   Specimen: Nasopharyngeal Swab; Nasopharyngeal(NP) swabs in vial transport medium  Result Value Ref Range Status   SARS Coronavirus 2 by RT PCR NEGATIVE NEGATIVE Final    Comment: (NOTE) SARS-CoV-2 target nucleic acids are NOT DETECTED.  The SARS-CoV-2 RNA is generally detectable in upper respiratory specimens during the acute phase of infection. The lowest concentration of SARS-CoV-2 viral copies this assay can detect is 138 copies/mL. A negative result does not preclude SARS-Cov-2 infection and should not be used as the sole basis for treatment or other patient management decisions. A negative result may occur with  improper specimen collection/handling, submission of specimen other than nasopharyngeal swab, presence of viral mutation(s) within the areas targeted by this assay,  and inadequate number of viral copies(<138 copies/mL). A negative result must be combined with clinical observations, patient history, and epidemiological information. The expected result is Negative.  Fact Sheet for Patients:  EntrepreneurPulse.com.au  Fact Sheet for Healthcare Providers:  IncredibleEmployment.be  This test is no t yet approved or cleared by the [place] FDA and  has been authorized for detection and/or diagnosis of SARS-CoV-2 by FDA under an Emergency Use Authorization (EUA). This EUA will remain  in effect (meaning this test can be used) for the duration of the COVID-19 declaration under Section 564(b)(1) of the Act, 21 U.S.C.section 360bbb-3(b)(1), unless the authorization is terminated  or revoked sooner.       Influenza A by PCR NEGATIVE NEGATIVE Final   Influenza B by PCR NEGATIVE NEGATIVE Final    Comment: (NOTE) The Xpert Xpress SARS-CoV-2/FLU/RSV plus assay is intended as an aid in the diagnosis of influenza from Nasopharyngeal swab specimens and should not be used as a sole basis for treatment. Nasal washings and aspirates are unacceptable for Xpert Xpress SARS-CoV-2/FLU/RSV testing.  Fact Sheet for Patients: EntrepreneurPulse.com.au  Fact Sheet for Healthcare Providers: IncredibleEmployment.be  This test is not yet approved or cleared by the [place] and has been authorized  for detection and/or diagnosis of SARS-CoV-2 by FDA under an Emergency Use Authorization (EUA). This EUA will remain in effect (meaning this test can be used) for the duration of the COVID-19 declaration under Section 564(b)(1) of the Act, 21 U.S.C. section 360bbb-3(b)(1), unless the authorization is terminated or revoked.  Performed at [facility], [place] [address], [place], [place] [address]   MRSA PCR Screening     Status: None   Collection Time: 05/26/20 11:35 PM   Specimen:  Nasal Mucosa; Nasopharyngeal  Result Value Ref Range Status   MRSA by PCR NEGATIVE NEGATIVE Final    Comment:        The GeneXpert MRSA Assay (FDA approved for NASAL specimens only), is one component of a comprehensive MRSA colonization surveillance program. It is not intended to diagnose MRSA infection nor to guide or monitor treatment for MRSA infections. Performed at [facility], [place] [address], [place], [place] [address]   Culture, blood (Routine X 2) w Reflex to ID Panel     Status: None   Collection Time: 05/27/20 12:54 AM   Specimen: BLOOD  Result Value Ref Range Status   Specimen Description BLOOD RIGHT HAND  Final   Special Requests   Final    BOTTLES DRAWN AEROBIC ONLY Blood Culture adequate volume   Culture   Final    NO GROWTH 5 DAYS Performed at [facility], [address] [place], [place] [address]    Report Status 06/01/2020 FINAL  Final  Resp Panel by RT-PCR (Flu A&B, Covid)     Status: None   Collection Time: 05/28/20 11:10 AM   Specimen: Nasopharyngeal(NP) swabs in vial transport medium  Result Value Ref Range Status   SARS Coronavirus 2 by RT PCR NEGATIVE NEGATIVE Final    Comment: (NOTE) SARS-CoV-2 target nucleic acids are NOT DETECTED.  The SARS-CoV-2 RNA is generally detectable in upper respiratory specimens during the acute phase of infection. The lowest concentration of SARS-CoV-2 viral copies this assay can detect is 138 copies/mL. A negative result does not preclude SARS-Cov-2 infection and should not be used as the sole basis for treatment or other patient management decisions. A negative result may occur with  improper specimen collection/handling, submission of specimen other than nasopharyngeal swab, presence of viral mutation(s) within the areas targeted by this assay, and inadequate number of viral copies(<138 copies/mL). A negative result must be combined with clinical observations, patient history, and  epidemiological information. The expected result is Negative.  Fact Sheet for Patients:  EntrepreneurPulse.com.au  Fact Sheet for Healthcare Providers:  IncredibleEmployment.be  This test is no t yet approved or cleared by the [place] FDA and  has been authorized for detection and/or diagnosis of SARS-CoV-2 by FDA under an Emergency Use Authorization (EUA). This EUA will remain  in effect (meaning this test can be used) for the duration of the COVID-19 declaration under Section 564(b)(1) of the Act, 21 U.S.C.section 360bbb-3(b)(1), unless the authorization is terminated  or revoked sooner.       Influenza A by PCR NEGATIVE NEGATIVE Final   Influenza B by PCR NEGATIVE NEGATIVE Final    Comment: (NOTE) The Xpert Xpress SARS-CoV-2/FLU/RSV plus assay is intended as an aid in the diagnosis of influenza from Nasopharyngeal swab specimens and should not be used as a sole basis for treatment. Nasal washings and aspirates are unacceptable for Xpert Xpress SARS-CoV-2/FLU/RSV testing.  Fact Sheet for Patients: EntrepreneurPulse.com.au  Fact Sheet for Healthcare Providers: IncredibleEmployment.be  This test is not yet approved or cleared by the [place] and has been authorized for detection and/or diagnosis of SARS-CoV-2 by FDA under an Emergency Use Authorization (EUA). This EUA will remain in effect (meaning this test can be used) for the duration of the COVID-19 declaration under Section 564(b)(1) of the Act, 21 U.S.C. section 360bbb-3(b)(1), unless the authorization is terminated or revoked.  Performed at [facility], [place] [address] [place], [place] [address]      Discharge Instructions:   Discharge Instructions    Diet - low sodium heart healthy   Complete by: As directed    Discharge instructions   Complete by: As directed    1)Please take prescribed medications as  instructed 2)Follow up with your cardiologist in 2 weeks.   Increase activity slowly   Complete by: As directed      Allergies as of 06/01/2020   No Known Allergies     Medication List    TAKE these medications   acetaminophen 500 MG tablet Commonly known as: TYLENOL Take 500 mg by mouth every 8 (eight) hours.   apixaban 5 MG Tabs tablet Commonly known as: ELIQUIS Take 1 tablet (5 mg total) by mouth 2 (two) times daily.   atorvastatin 10 MG tablet Commonly known as: LIPITOR Take 1 tablet (10 mg total) by mouth daily.   diltiazem 60 MG tablet Commonly known as: CARDIZEM Take 1 tablet (60 mg total) by mouth 3 (three) times daily. What changed:   medication strength  how much to take  when to take this   furosemide 20 MG tablet Commonly known as: LASIX Take 1 tablet (20 mg total) by mouth daily. What changed:   how much to take  when to take this  additional instructions   hydrOXYzine 25 MG tablet Commonly known as: ATARAX/VISTARIL Take 25 mg by mouth at bedtime as needed for anxiety.   levothyroxine 100 MCG tablet Commonly known as: SYNTHROID Take 1 tablet by mouth daily.   metoprolol tartrate 100 MG tablet Commonly known as: LOPRESSOR Take 100 mg by mouth 2 (two) times daily.   multivitamin tablet Take 1 tablet by mouth every evening.   Myrbetriq 50 MG Tb24 tablet Generic drug: mirabegron ER Take 50 mg by mouth every evening.   nitroGLYCERIN 0.4 MG SL tablet Commonly known as: NITROSTAT Place 0.4 mg under the tongue every 5 (five) minutes as needed. For chest pain   trimethoprim 100 MG tablet Commonly known as: TRIMPEX Take 100 mg by mouth daily.   vitamin B-12 500 MCG tablet Commonly known as: CYANOCOBALAMIN Take 500 mcg by mouth daily. What changed: Another medication with the same name was removed. Continue taking this medication, and follow the directions you see here.       Follow-up Information    Holland Commons, FNP. Schedule  an appointment as soon as possible for a visit in 1 week(s).   Specialty: Internal Medicine Contact information: [address] [place] [address] [phone]        Lelon Perla, MD. Schedule an appointment as soon as possible for a visit in 2 week(s).   Specialty: Cardiology Contact information: [address] [place] [address] [phone]                Time coordinating discharge: 39 minutes  Signed:  Chauntay Paszkiewicz  [facility] 06/01/2020, 12:50 PM

## 2020-06-01 NOTE — NC FL2 (Addendum)
[place] LEVEL OF CARE SCREENING TOOL     IDENTIFICATION  Patient Name: [name] Birthdate: Jun 30, 1920 Sex: female Admission Date (Current Location): 05/26/2020  [facility] and [place] Number:  Herbalist and Address:  The [facility], [place] [address], [place], [place] [address]      Provider Number: [id number]  Attending Physician Name and Address:  Flora Lipps, MD  Relative Name and Phone Number:  [place], [place])   [phone] (Work Phone)    Current Level of Care: Hospital Recommended Level of Care: [place] Prior Approval Number:    Date Approved/Denied:   PASRR Number: [id number]  Discharge Plan: Other (Comment) ([place])    Current Diagnoses: Patient Active Problem List   Diagnosis Date Noted  . Rapid atrial fibrillation ([place]) 05/26/2020  . Acute pulmonary embolism ([place]) 02/10/2020  . Chronic diastolic CHF (congestive heart failure) ([place]) 02/10/2020  . New onset atrial fibrillation ([place]) 02/10/2020  . Lung nodule 02/10/2020  . History of echocardiogram   . GERD (gastroesophageal reflux disease)   . Diverticulosis   . Carotid bruit   . IBS (irritable bowel syndrome)   . Hypothyroidism   . HTN (hypertension)   . HLD (hyperlipidemia)   . CAD (coronary artery disease)   . Atypical chest pain 06/14/2012  . Chest pain 02/12/2012  . Fatigue 04/22/2011  . CAROTID BRUIT 03/19/2009  . HYPOTHYROIDISM 08/29/2008  . HEMORRHOIDS 08/29/2008  . GERD 08/29/2008  . DIVERTICULOSIS, COLON 08/29/2008  . HYPERLIPIDEMIA 07/04/2008  . HYPERTENSION, BENIGN 07/04/2008  . CAD, NATIVE VESSEL 07/04/2008  . IRRITABLE BOWEL SYNDROME 07/04/2008    Orientation RESPIRATION BLADDER Height & Weight     Self,Place  Normal Continent Weight: 125 lb 10.6 oz (57 kg) Height:     BEHAVIORAL SYMPTOMS/MOOD NEUROLOGICAL BOWEL NUTRITION STATUS      Continent  (Regular Diet - no  added salt)  AMBULATORY STATUS COMMUNICATION OF NEEDS Skin   Limited Assist Verbally Normal                       Personal Care Assistance Level of Assistance  Bathing,Feeding,Dressing Bathing Assistance: Limited assistance Feeding assistance: Independent Dressing Assistance: Limited assistance     Functional Limitations Info  Sight,Speech,Hearing Sight Info: Adequate Hearing Info: Adequate Speech Info: Adequate    SPECIAL CARE FACTORS FREQUENCY  OT (By licensed OT),PT (By licensed PT)     PT Frequency: 2-3/week OT Frequency: 2-3/week            Contractures Contractures Info: Not present    Additional Factors Info  Code Status,Allergies Code Status Info: DNR Allergies Info: no known allergies              Discharge Medications: Medication List    TAKE these medications   acetaminophen 500 MG tablet Commonly known as: TYLENOL Take 500 mg by mouth every 8 (eight) hours.   apixaban 5 MG Tabs tablet Commonly known as: ELIQUIS Take 1 tablet (5 mg total) by mouth 2 (two) times daily.   atorvastatin 10 MG tablet Commonly known as: LIPITOR Take 1 tablet (10 mg total) by mouth daily.   diltiazem 60 MG tablet Commonly known as: CARDIZEM Take 1 tablet (60 mg total) by mouth 3 (three) times daily. What changed:   medication strength  how much to take  when to take this   furosemide 20 MG tablet Commonly known as: LASIX Take 1 tablet (20  mg total) by mouth daily. What changed:   how much to take  when to take this  additional instructions   hydrOXYzine 25 MG tablet Commonly known as: ATARAX/VISTARIL Take 25 mg by mouth at bedtime as needed for anxiety.   levothyroxine 100 MCG tablet Commonly known as: SYNTHROID Take 1 tablet by mouth daily.   metoprolol tartrate 100 MG tablet Commonly known as: LOPRESSOR Take 100 mg by mouth 2 (two) times daily.   multivitamin tablet Take 1 tablet by mouth every evening.   Myrbetriq 50  MG Tb24 tablet Generic drug: mirabegron ER Take 50 mg by mouth every evening.   nitroGLYCERIN 0.4 MG SL tablet Commonly known as: NITROSTAT Place 0.4 mg under the tongue every 5 (five) minutes as needed. For chest pain   trimethoprim 100 MG tablet Commonly known as: TRIMPEX Take 100 mg by mouth daily.   vitamin B-12 500 MCG tablet Commonly known as: CYANOCOBALAMIN Take 500 mcg by mouth daily. What changed: Another medication with the same name was removed. Continue taking this medication, and follow the directions you see here.     Relevant Imaging Results:  Relevant Lab Results:   Additional Information SS[id number] [place], [place]

## 2020-06-01 NOTE — Progress Notes (Signed)
[facility] ([facility])  Hospital Liaison RN note         Notified by TOC manager of patient/family request for [facility] Palliative services at home after discharge.              [facility] Palliative team will follow up with patient after discharge.         Please call with any hospice or palliative related questions.         Thank you for the opportunity to participate in this patient's care.     Chrislyn King, BSN, RN [facility] Hospital Liaison (listed on AM[facility] under Hospice/[facility])    [phone] (24h on call)    

## 2020-06-01 NOTE — TOC Transition Note (Signed)
Transition of Care [facility]) - CM/SW Discharge Note   Patient Details  Name: [name] MRN: [id number] Date of Birth: Jun 14, 1920  Transition of Care [facility]) CM/SW Contact:  Bethann Berks[place], [place] Phone Number: 06/01/2020, 2:53 PM   Clinical Narrative:     Patient will DC to: [facility] Anticipated DC date: 06/01/20 Family notified: [name] Tomasita Crumble Transport by: Corey Harold   Per MD patient ready for DC to [facility]. RN, patient, patient's family, and facility notified of DC. Discharge Summary and FL2 sent to facility. RN to call report prior to discharge [phone]). DC packet on chart. Ambulance transport requested for patient.   CSW will sign off for now as social work intervention is no longer needed. Please consult [place] again if new needs arise.   Final next level of care: Assisted Living Barriers to Discharge: No Barriers Identified   Patient Goals and CMS Choice Patient states their goals for this hospitalization and ongoing recovery are:: Return to [facility].gov Compare Post Acute Care list provided to:: Patient Represenative (must comment) (Pt son [name]) Choice offered to / list presented to : Adult Children  Discharge Placement                Patient to be transferred to facility by: [place] Name of family member notified: [name] Garringer Patient and family notified of of transfer: 06/01/20  Discharge Plan and Services                                     Social Determinants of Health (SDOH) Interventions     Readmission Risk Interventions No flowsheet data found.

## 2020-06-01 NOTE — TOC Progression Note (Addendum)
Transition of Care [facility]) - Progression Note    Patient Details  Name: [name] MRN: [id number] Date of Birth: May 27, 1920  Transition of Care [facility]) CM/SW [place], [place] Phone Number: 06/01/2020, 12:30 PM  Clinical Narrative:     CSW called pt's son and explained plan to d/c today to [facility] ALF. Son is confused and thinking pt would receive more rehab before returning. CSW explained that SNF insurance was denied and that pt is medically stable for DC from [facility] had previously said they could have pt return if she was 1 person assist. They will then arrange [facility] PT/OT. Son states his mother would need non emergency ambulance transport. Insurance has approved PTAR transport.   CSW called [facility] who can have pt return today. FL2 and D/C summary needs to be faxed to [phone]  1330: CSW spoke with son again. He was under the impression that pt would be receiving more PT prior to d/c. CSW explained that pt is being cleared for discharge today. Pt considers appealing discharge though at this time does not want to pursue an appeal. CSW to contact pt son when transport is scheduled. CSW faxed DC summary, fl2, HH orders, and face to face. [facility] contracts palliative with [facility]. CSW called Chrislyn with [facility] and makes palliative referral.   Expected Discharge Plan: [place] Barriers to Discharge: Continued Medical Work up  Expected Discharge Plan and Services Expected Discharge Plan: [place] arrangements for the past 2 months: [place] Expected Discharge Date: 05/28/20                                     Social Determinants of Health (SDOH) Interventions    Readmission Risk Interventions No flowsheet data found.

## 2020-06-01 NOTE — Progress Notes (Signed)
Discharge instructions given to receiving RN at [facility].

## 2020-06-07 DIAGNOSIS — F4321 Adjustment disorder with depressed mood: Secondary | ICD-10-CM | POA: Diagnosis not present

## 2020-06-19 DIAGNOSIS — F4321 Adjustment disorder with depressed mood: Secondary | ICD-10-CM | POA: Diagnosis not present

## 2020-06-22 DIAGNOSIS — I4891 Unspecified atrial fibrillation: Secondary | ICD-10-CM | POA: Diagnosis not present

## 2020-06-22 DIAGNOSIS — W19XXXA Unspecified fall, initial encounter: Secondary | ICD-10-CM | POA: Diagnosis not present

## 2020-06-26 DIAGNOSIS — F4321 Adjustment disorder with depressed mood: Secondary | ICD-10-CM | POA: Diagnosis not present

## 2020-07-07 DIAGNOSIS — F4321 Adjustment disorder with depressed mood: Secondary | ICD-10-CM | POA: Diagnosis not present

## 2020-07-17 DIAGNOSIS — M6281 Muscle weakness (generalized): Secondary | ICD-10-CM | POA: Diagnosis not present

## 2020-07-17 DIAGNOSIS — F4321 Adjustment disorder with depressed mood: Secondary | ICD-10-CM | POA: Diagnosis not present

## 2020-07-17 DIAGNOSIS — I4891 Unspecified atrial fibrillation: Secondary | ICD-10-CM | POA: Diagnosis not present

## 2020-07-17 DIAGNOSIS — W19XXXD Unspecified fall, subsequent encounter: Secondary | ICD-10-CM | POA: Diagnosis not present

## 2020-07-30 DIAGNOSIS — F4321 Adjustment disorder with depressed mood: Secondary | ICD-10-CM | POA: Diagnosis not present

## 2020-08-01 DIAGNOSIS — R0902 Hypoxemia: Secondary | ICD-10-CM | POA: Diagnosis not present

## 2020-08-01 DIAGNOSIS — S0990XA Unspecified injury of head, initial encounter: Secondary | ICD-10-CM | POA: Diagnosis not present

## 2020-08-01 DIAGNOSIS — I4891 Unspecified atrial fibrillation: Secondary | ICD-10-CM | POA: Diagnosis not present

## 2020-08-01 DIAGNOSIS — F32A Depression, unspecified: Secondary | ICD-10-CM | POA: Diagnosis not present

## 2020-08-01 DIAGNOSIS — R42 Dizziness and giddiness: Secondary | ICD-10-CM | POA: Diagnosis not present

## 2020-08-03 ENCOUNTER — Emergency Department (HOSPITAL_COMMUNITY)
Admission: EM | Admit: 2020-08-03 | Discharge: 2020-08-03 | Disposition: A | Discharge status: Home / Self Care | Payer: PPO | Source: Home / Self Care | Attending: Emergency Medicine | Admitting: Emergency Medicine

## 2020-08-03 ENCOUNTER — Emergency Department (HOSPITAL_COMMUNITY): Payer: PPO

## 2020-08-03 ENCOUNTER — Encounter (HOSPITAL_COMMUNITY): Payer: Self-pay | Admitting: Emergency Medicine

## 2020-08-03 DIAGNOSIS — W1811XA Fall from or off toilet without subsequent striking against object, initial encounter: Secondary | ICD-10-CM

## 2020-08-03 DIAGNOSIS — M533 Sacrococcygeal disorders, not elsewhere classified: Secondary | ICD-10-CM | POA: Diagnosis not present

## 2020-08-03 DIAGNOSIS — J329 Chronic sinusitis, unspecified: Secondary | ICD-10-CM | POA: Diagnosis not present

## 2020-08-03 DIAGNOSIS — W1812XA Fall from or off toilet with subsequent striking against object, initial encounter: Secondary | ICD-10-CM | POA: Insufficient documentation

## 2020-08-03 DIAGNOSIS — M47812 Spondylosis without myelopathy or radiculopathy, cervical region: Secondary | ICD-10-CM | POA: Diagnosis not present

## 2020-08-03 DIAGNOSIS — Z7901 Long term (current) use of anticoagulants: Secondary | ICD-10-CM | POA: Insufficient documentation

## 2020-08-03 DIAGNOSIS — W19XXXA Unspecified fall, initial encounter: Secondary | ICD-10-CM | POA: Diagnosis not present

## 2020-08-03 DIAGNOSIS — S0181XA Laceration without foreign body of other part of head, initial encounter: Secondary | ICD-10-CM | POA: Insufficient documentation

## 2020-08-03 DIAGNOSIS — I4891 Unspecified atrial fibrillation: Secondary | ICD-10-CM | POA: Diagnosis not present

## 2020-08-03 DIAGNOSIS — Z043 Encounter for examination and observation following other accident: Secondary | ICD-10-CM | POA: Diagnosis not present

## 2020-08-03 DIAGNOSIS — J8410 Pulmonary fibrosis, unspecified: Secondary | ICD-10-CM | POA: Diagnosis not present

## 2020-08-03 DIAGNOSIS — D649 Anemia, unspecified: Secondary | ICD-10-CM | POA: Diagnosis not present

## 2020-08-03 DIAGNOSIS — H8110 Benign paroxysmal vertigo, unspecified ear: Secondary | ICD-10-CM | POA: Diagnosis not present

## 2020-08-03 DIAGNOSIS — N179 Acute kidney failure, unspecified: Secondary | ICD-10-CM | POA: Diagnosis not present

## 2020-08-03 DIAGNOSIS — R42 Dizziness and giddiness: Secondary | ICD-10-CM | POA: Diagnosis not present

## 2020-08-03 DIAGNOSIS — R55 Syncope and collapse: Secondary | ICD-10-CM | POA: Diagnosis not present

## 2020-08-03 LAB — BASIC METABOLIC PANEL
Anion gap: 12 (ref 5–15)
BUN: 18 mg/dL (ref 8–23)
CO2: 27 mmol/L (ref 22–32)
Calcium: 9.3 mg/dL (ref 8.9–10.3)
Chloride: 99 mmol/L (ref 98–111)
Creatinine, Ser: 1.25 mg/dL — ABNORMAL HIGH (ref 0.44–1.00)
GFR, Estimated: 39 mL/min — ABNORMAL LOW (ref >60)
Glucose, Bld: 100 mg/dL — ABNORMAL HIGH (ref 70–99)
Potassium: 4.8 mmol/L (ref 3.5–5.1)
Sodium: 138 mmol/L (ref 135–145)

## 2020-08-03 LAB — CBC WITH DIFFERENTIAL/PLATELET
Abs Immature Granulocytes: 0.07 10*3/uL (ref 0.00–0.07)
Basophils Absolute: 0 10*3/uL (ref 0.0–0.1)
Basophils Relative: 0 %
Eosinophils Absolute: 0.3 10*3/uL (ref 0.0–0.5)
Eosinophils Relative: 3 %
HCT: 48 % — ABNORMAL HIGH (ref 36.0–46.0)
Hemoglobin: 15.3 g/dL — ABNORMAL HIGH (ref 12.0–15.0)
Immature Granulocytes: 1 %
Lymphocytes Relative: 22 %
Lymphs Abs: 2.6 10*3/uL (ref 0.7–4.0)
MCH: 34.5 pg — ABNORMAL HIGH (ref 26.0–34.0)
MCHC: 31.9 g/dL (ref 30.0–36.0)
MCV: 108.1 fL — ABNORMAL HIGH (ref 80.0–100.0)
Monocytes Absolute: 0.8 10*3/uL (ref 0.1–1.0)
Monocytes Relative: 6 %
Neutro Abs: 8.1 10*3/uL — ABNORMAL HIGH (ref 1.7–7.7)
Neutrophils Relative %: 68 %
Platelets: 202 10*3/uL (ref 150–400)
RBC: 4.44 MIL/uL (ref 3.87–5.11)
RDW: 13.3 % (ref 11.5–15.5)
WBC: 11.9 10*3/uL — ABNORMAL HIGH (ref 4.0–10.5)
nRBC: 0 % (ref 0.0–0.2)

## 2020-08-03 MED ORDER — LIDOCAINE-EPINEPHRINE (PF) 2 %-1:200000 IJ SOLN
10.0000 mL | Freq: Once | INTRAMUSCULAR | Status: AC
  Administered 2020-08-03: 10 mL via INTRADERMAL
  Filled 2020-08-03: qty 20

## 2020-08-03 NOTE — ED Provider Notes (Signed)
..  Laceration Repair  Date/Time: 08/03/2020 3:13 PM Performed by: Jacqlyn Larsen, PA-C Authorized by: Jacqlyn Larsen, PA-C   Consent:    Consent obtained:  Verbal   Consent given by:  Patient   Risks, benefits, and alternatives were discussed: yes     Risks discussed:  Infection Universal protocol:    Procedure explained and questions answered to patient or proxy's satisfaction: yes     Patient identity confirmed:  Verbally with patient Anesthesia:    Anesthesia method:  Local infiltration   Local anesthetic:  Lidocaine 2% WITH epi Laceration details:    Location:  Face   Length (cm):  4   Depth (mm):  3 Pre-procedure details:    Preparation:  Patient was prepped and draped in usual sterile fashion Exploration:    Hemostasis achieved with:  Epinephrine and direct pressure   Wound exploration: entire depth of wound visualized     Wound extent: areolar tissue violated   Treatment:    Area cleansed with:  Shur-Clens   Amount of cleaning:  Standard Skin repair:    Repair method:  Sutures   Suture size:  6-0   Suture material:  Prolene   Suture technique:  Simple interrupted   Number of sutures:  5 Approximation:    Approximation:  Close Repair type:    Repair type:  Simple Post-procedure details:    Procedure completion:  Tolerated well, no immediate complications Comments:     Laceration repaired by PA student under my direct supervision      Jacqlyn Larsen, PA-C 08/03/20 [place], [place], DO 08/03/20 1517

## 2020-08-03 NOTE — ED Notes (Signed)
DC instructions reviewed with the pt and family.  Understanding was verbalized.  PT DC.

## 2020-08-03 NOTE — ED Triage Notes (Signed)
Pt here as a level 2 fall on thinners after falling off the toliet this am , no loc , pt  Has lac to the right side of her head, was here for fall recently

## 2020-08-03 NOTE — Discharge Instructions (Signed)
Return for redness drainage or if you get a fever.  The sutures that were used are should be removed between day 3 and day 5.  I.  The area can get wet but not fully immersed underwater.  No scrubbing.  If you really want to clean it you can apply a half-and-half hydrogen peroxide solution with water on a Q-tip.  You can apply an ointment a couple times a day this could be as simple as Vaseline but could also be an antibiotic ointment if you wish.

## 2020-08-03 NOTE — ED Provider Notes (Signed)
[facility] EMERGENCY DEPARTMENT Provider Note   CSN: [id number] Arrival date & time: 08/03/20  1202     History No chief complaint on file.   [name] is a 87 y.o. female.  87 yo F with a chief complaint of a fall.  Patient tells me she has been falling frequently.  She has been feeling dizzy as well ever since she fell.  She does not know when the last time she struck her head was but has felt dizzy since.  She was on the toilet today and felt dizzy and fell off.  Struck the right side of her head and she thinks landed on her tailbone as well.  She denies any chest pain abdominal pain back pain.  Denies extremity pain.  The history is provided by the patient and the EMS personnel.  Fall This is a recurrent problem. The current episode started less than 1 hour ago. The problem occurs rarely. The problem has been resolved. Associated symptoms include headaches (right sided). Pertinent negatives include no chest pain and no shortness of breath. Nothing aggravates the symptoms. Nothing relieves the symptoms. She has tried nothing for the symptoms. The treatment provided no relief.       History reviewed. No pertinent past medical history.  There are no problems to display for this patient.   History reviewed. No pertinent surgical history.   OB History   No obstetric history on file.     No family history on file.     Home Medications Prior to Admission medications   Medication Sig Start Date End Date Taking? Authorizing Provider  acetaminophen (TYLENOL) 500 MG tablet Take 1,000 mg by mouth every 8 (eight) hours as needed (for pain).   Yes [provider]  atorvastatin (LIPITOR) 10 MG tablet Take 10 mg by mouth at bedtime.   Yes [provider]  diltiazem (CARDIZEM) 60 MG tablet Take 60 mg by mouth 3 (three) times daily. 07/16/20  Yes [provider]  ELIQUIS 5 MG TABS tablet Take 5 mg by mouth in the morning and at bedtime.    Yes [provider]  furosemide (LASIX) 20 MG tablet Take 20 mg by mouth in the morning.   Yes [provider]  hydrOXYzine (VISTARIL) 25 MG capsule Take 25 mg by mouth daily as needed for anxiety.   Yes [provider]  levothyroxine (SYNTHROID) 100 MCG tablet Take 100 mcg by mouth daily before breakfast.   Yes [provider]  metoprolol tartrate (LOPRESSOR) 100 MG tablet Take 100 mg by mouth in the morning and at bedtime.   Yes [provider]  Multiple Vitamins-Minerals (ONE-A-DAY PROACTIVE 65+ PO) Take 1 tablet by mouth at bedtime.   Yes [provider]  MYRBETRIQ 50 MG TB24 tablet Take 50 mg by mouth at bedtime.   Yes [provider]  nitroGLYCERIN (NITROSTAT) 0.4 MG SL tablet Place 0.4 mg under the tongue every 5 (five) minutes as needed for chest pain.   Yes [provider]  trimethoprim (TRIMPEX) 100 MG tablet Take 100 mg by mouth in the morning.   Yes [provider]  vitamin B-12 (CYANOCOBALAMIN) 500 MCG tablet Take 500 mcg by mouth daily.   Yes [provider]    Allergies    Patient has no known allergies.  Review of Systems   Review of Systems  Constitutional: Negative for chills and fever.  HENT: Negative for congestion and rhinorrhea.   Eyes: Negative  for redness and visual disturbance.  Respiratory: Negative for shortness of breath and wheezing.   Cardiovascular: Negative for chest pain and palpitations.  Gastrointestinal: Negative for nausea and vomiting.  Genitourinary: Negative for dysuria and urgency.  Musculoskeletal: Negative for arthralgias and myalgias.       Tailbone pain   Skin: Positive for wound. Negative for pallor.  Neurological: Positive for headaches (right sided). Negative for dizziness.    Physical Exam Updated Vital Signs BP (!) 146/100   Pulse 96   Temp (!) 97.4 F (36.3 C) (Axillary)   Resp (!) 21   SpO2 99%   Physical Exam Vitals and nursing note  reviewed.  Constitutional:      General: She is not in acute distress.    Appearance: She is well-developed. She is not diaphoretic.  HENT:     Head: Normocephalic.     Comments: Laceration to the right temporal area.  No periorbital tenderness.  No ocular involvement.  No pain with movement of the jaw. Eyes:     Pupils: Pupils are equal, round, and reactive to light.  Cardiovascular:     Rate and Rhythm: Normal rate and regular rhythm.     Heart sounds: No murmur heard. No friction rub. No gallop.   Pulmonary:     Effort: Pulmonary effort is normal.     Breath sounds: No wheezing or rales.  Abdominal:     General: There is no distension.     Palpations: Abdomen is soft.     Tenderness: There is no abdominal tenderness.  Musculoskeletal:        General: No tenderness.     Cervical back: Normal range of motion and neck supple.     Comments: Tenderness about the tailbone.  No midline spinal tenderness otherwise.  Skin:    General: Skin is warm and dry.  Neurological:     Mental Status: She is alert and oriented to person, place, and time.  Psychiatric:        Behavior: Behavior normal.     ED Results / Procedures / Treatments   Labs (all labs ordered are listed, but only abnormal results are displayed) Labs Reviewed  CBC WITH DIFFERENTIAL/PLATELET - Abnormal; Notable for the following components:      Result Value   WBC 11.9 (*)    Hemoglobin 15.3 (*)    HCT 48.0 (*)    MCV 108.1 (*)    MCH 34.5 (*)    Neutro Abs 8.1 (*)    All other components within normal limits  BASIC METABOLIC PANEL - Abnormal; Notable for the following components:   Glucose, Bld 100 (*)    Creatinine, Ser 1.25 (*)    GFR, Estimated 39 (*)    All other components within normal limits    EKG EKG Interpretation  Date/Time:  Monday August 03 2020 12:09:12 EDT Ventricular Rate:  85 PR Interval:    QRS Duration: 72 QT Interval:  387 QTC Calculation: 461 R Axis:   86 Text  Interpretation: Atrial fibrillation Borderline right axis deviation Anteroseptal infarct, old Borderline repolarization abnormality No significant change since last tracing Confirmed by Deno Etienne [phone]) on 08/03/2020 1:15:50 PM   Radiology DG Sacrum/Coccyx  Result Date: 08/03/2020 CLINICAL DATA:  87 year old post fall. Fall off toilet this morning. EXAM: SACRUM AND COCCYX - 2+ VIEW COMPARISON:  Reformats from abdominopelvic CT 05/26/2020 FINDINGS: There is no evidence of fracture or other focal bone lesions. Cortical margins of the sacrum and coccyx appear  intact. Bones are diffusely under mineralized. Sacroiliac joints are congruent. No sacral alar disruption. IMPRESSION: No evidence of sacral or coccygeal fracture. Electronically Signed   By: Keith Rake M.D.   On: 08/03/2020 15:01   CT Head Wo Contrast  Result Date: 08/03/2020 CLINICAL DATA:  Status post fall.  Initial encounter. EXAM: CT HEAD WITHOUT CONTRAST CT CERVICAL SPINE WITHOUT CONTRAST TECHNIQUE: Multidetector CT imaging of the head and cervical spine was performed following the standard protocol without intravenous contrast. Multiplanar CT image reconstructions of the cervical spine were also generated. COMPARISON:  None. FINDINGS: CT HEAD FINDINGS Brain: No evidence of acute infarction, hemorrhage, hydrocephalus, extra-axial collection or mass lesion/mass effect. Atrophy and chronic microvascular ischemic change noted. Vascular: No hyperdense vessel or unexpected calcification. Skull: Intact.  No focal lesion. Sinuses/Orbits: Mild mucosal thickening right sphenoid sinus. Very small mucous retention cyst or polyp left maxillary sinus. Other: None. CT CERVICAL SPINE FINDINGS Alignment: Mild reversal of the normal cervical lordosis. Trace anterolisthesis C4 on C5 and C7 on T1 is due to facet degenerative disease. Skull base and vertebrae: No acute fracture. No primary bone lesion or focal pathologic process. Failure fusion of the posterior  arch of C1 incidentally noted. Soft tissues and spinal canal: Negative. Disc levels:  Marked multilevel loss of disc space height. Upper chest: Calcified granuloma left upper lobe noted. Other: None. IMPRESSION: No acute abnormality head or cervical spine. Atrophy and chronic microvascular ischemic change. Multilevel cervical spondylosis. Mild sinus disease. Electronically Signed   By: Inge Rise M.D.   On: 08/03/2020 14:13   CT Cervical Spine Wo Contrast  Result Date: 08/03/2020 CLINICAL DATA:  Status post fall.  Initial encounter. EXAM: CT HEAD WITHOUT CONTRAST CT CERVICAL SPINE WITHOUT CONTRAST TECHNIQUE: Multidetector CT imaging of the head and cervical spine was performed following the standard protocol without intravenous contrast. Multiplanar CT image reconstructions of the cervical spine were also generated. COMPARISON:  None. FINDINGS: CT HEAD FINDINGS Brain: No evidence of acute infarction, hemorrhage, hydrocephalus, extra-axial collection or mass lesion/mass effect. Atrophy and chronic microvascular ischemic change noted. Vascular: No hyperdense vessel or unexpected calcification. Skull: Intact.  No focal lesion. Sinuses/Orbits: Mild mucosal thickening right sphenoid sinus. Very small mucous retention cyst or polyp left maxillary sinus. Other: None. CT CERVICAL SPINE FINDINGS Alignment: Mild reversal of the normal cervical lordosis. Trace anterolisthesis C4 on C5 and C7 on T1 is due to facet degenerative disease. Skull base and vertebrae: No acute fracture. No primary bone lesion or focal pathologic process. Failure fusion of the posterior arch of C1 incidentally noted. Soft tissues and spinal canal: Negative. Disc levels:  Marked multilevel loss of disc space height. Upper chest: Calcified granuloma left upper lobe noted. Other: None. IMPRESSION: No acute abnormality head or cervical spine. Atrophy and chronic microvascular ischemic change. Multilevel cervical spondylosis. Mild sinus disease.  Electronically Signed   By: Inge Rise M.D.   On: 08/03/2020 14:13    Procedures Procedures   Medications Ordered in ED Medications  lidocaine-EPINEPHrine (XYLOCAINE W/EPI) 2 %-1:200000 (PF) injection 10 mL (10 mLs Intradermal Given by Other 08/03/20 1352)    ED Course  I have reviewed the triage vital signs and the nursing notes.  Pertinent labs & imaging results that were available during my care of the patient were reviewed by me and considered in my medical decision making (see chart for details).    MDM Rules/Calculators/A&P  87 yo F with a chief complaint of a fall.  Patient states that she has been falling frequently.  She denies any new medications as far she knows.  Feels that she has been dizzy since she fell and struck her head on her previous visit.  She not sure where she went.  We will obtain a CT of the head and C-spine.  Plain film of the coccyx.   CT negative, plain film negative.  D/c home.    Labwork without concerning abnormality.   3:15 PM:  I have discussed the diagnosis/risks/treatment options with the patient and believe the pt to be eligible for discharge home to follow-up with PCP. We also discussed returning to the ED immediately if new or worsening sx occur. We discussed the sx which are most concerning (e.g., sudden worsening pain, fever, inability to tolerate by mouth) that necessitate immediate return. Medications administered to the patient during their visit and any new prescriptions provided to the patient are listed below.  Medications given during this visit Medications  lidocaine-EPINEPHrine (XYLOCAINE W/EPI) 2 %-1:200000 (PF) injection 10 mL (10 mLs Intradermal Given by Other 08/03/20 1352)     The patient appears reasonably screen and/or stabilized for discharge and I doubt any other medical condition or other [facility] requiring further screening, evaluation, or treatment in the ED at this time prior to discharge.     Final Clinical Impression(s) / ED Diagnoses Final diagnoses:  Fall from toilet seat, initial encounter    Rx / DC Orders ED Discharge Orders    None       Deno Etienne, DO 08/03/20 1515

## 2020-08-04 ENCOUNTER — Inpatient Hospital Stay (HOSPITAL_COMMUNITY)
Admission: EM | Admit: 2020-08-04 | Discharge: 2020-08-11 | DRG: 149 | Disposition: A | Discharge status: Skilled Nursing Facility | Payer: PPO | Source: Skilled Nursing Facility | Attending: Student | Admitting: Student

## 2020-08-04 ENCOUNTER — Encounter (HOSPITAL_COMMUNITY): Payer: Self-pay

## 2020-08-04 ENCOUNTER — Encounter (HOSPITAL_COMMUNITY): Payer: Self-pay | Admitting: Emergency Medicine

## 2020-08-04 ENCOUNTER — Other Ambulatory Visit: Payer: Self-pay

## 2020-08-04 DIAGNOSIS — D649 Anemia, unspecified: Secondary | ICD-10-CM

## 2020-08-04 DIAGNOSIS — I959 Hypotension, unspecified: Secondary | ICD-10-CM | POA: Diagnosis not present

## 2020-08-04 DIAGNOSIS — H5509 Other forms of nystagmus: Secondary | ICD-10-CM | POA: Diagnosis present

## 2020-08-04 DIAGNOSIS — I4821 Permanent atrial fibrillation: Secondary | ICD-10-CM | POA: Diagnosis present

## 2020-08-04 DIAGNOSIS — M25531 Pain in right wrist: Secondary | ICD-10-CM | POA: Diagnosis present

## 2020-08-04 DIAGNOSIS — N179 Acute kidney failure, unspecified: Secondary | ICD-10-CM

## 2020-08-04 DIAGNOSIS — Z8249 Family history of ischemic heart disease and other diseases of the circulatory system: Secondary | ICD-10-CM

## 2020-08-04 DIAGNOSIS — E039 Hypothyroidism, unspecified: Secondary | ICD-10-CM | POA: Diagnosis present

## 2020-08-04 DIAGNOSIS — R42 Dizziness and giddiness: Secondary | ICD-10-CM

## 2020-08-04 DIAGNOSIS — I5032 Chronic diastolic (congestive) heart failure: Secondary | ICD-10-CM | POA: Diagnosis present

## 2020-08-04 DIAGNOSIS — Z681 Body mass index (BMI) 19 or less, adult: Secondary | ICD-10-CM

## 2020-08-04 DIAGNOSIS — I2510 Atherosclerotic heart disease of native coronary artery without angina pectoris: Secondary | ICD-10-CM | POA: Diagnosis present

## 2020-08-04 DIAGNOSIS — S0181XA Laceration without foreign body of other part of head, initial encounter: Secondary | ICD-10-CM | POA: Diagnosis present

## 2020-08-04 DIAGNOSIS — R52 Pain, unspecified: Secondary | ICD-10-CM

## 2020-08-04 DIAGNOSIS — R296 Repeated falls: Secondary | ICD-10-CM | POA: Diagnosis present

## 2020-08-04 DIAGNOSIS — I10 Essential (primary) hypertension: Secondary | ICD-10-CM | POA: Diagnosis present

## 2020-08-04 DIAGNOSIS — I130 Hypertensive heart and chronic kidney disease with heart failure and stage 1 through stage 4 chronic kidney disease, or unspecified chronic kidney disease: Secondary | ICD-10-CM | POA: Diagnosis present

## 2020-08-04 DIAGNOSIS — Z79890 Hormone replacement therapy (postmenopausal): Secondary | ICD-10-CM

## 2020-08-04 DIAGNOSIS — Z66 Do not resuscitate: Secondary | ICD-10-CM | POA: Diagnosis present

## 2020-08-04 DIAGNOSIS — W19XXXA Unspecified fall, initial encounter: Secondary | ICD-10-CM | POA: Diagnosis not present

## 2020-08-04 DIAGNOSIS — N1832 Chronic kidney disease, stage 3b: Secondary | ICD-10-CM | POA: Diagnosis present

## 2020-08-04 DIAGNOSIS — Z86711 Personal history of pulmonary embolism: Secondary | ICD-10-CM

## 2020-08-04 DIAGNOSIS — H8110 Benign paroxysmal vertigo, unspecified ear: Principal | ICD-10-CM | POA: Diagnosis present

## 2020-08-04 DIAGNOSIS — E43 Unspecified severe protein-calorie malnutrition: Secondary | ICD-10-CM | POA: Diagnosis present

## 2020-08-04 DIAGNOSIS — K589 Irritable bowel syndrome without diarrhea: Secondary | ICD-10-CM | POA: Diagnosis present

## 2020-08-04 DIAGNOSIS — E785 Hyperlipidemia, unspecified: Secondary | ICD-10-CM | POA: Diagnosis present

## 2020-08-04 DIAGNOSIS — Z79899 Other long term (current) drug therapy: Secondary | ICD-10-CM

## 2020-08-04 DIAGNOSIS — Z87440 Personal history of urinary (tract) infections: Secondary | ICD-10-CM

## 2020-08-04 DIAGNOSIS — R55 Syncope and collapse: Secondary | ICD-10-CM | POA: Diagnosis present

## 2020-08-04 DIAGNOSIS — Z7901 Long term (current) use of anticoagulants: Secondary | ICD-10-CM

## 2020-08-04 DIAGNOSIS — K219 Gastro-esophageal reflux disease without esophagitis: Secondary | ICD-10-CM | POA: Diagnosis present

## 2020-08-04 DIAGNOSIS — W1811XA Fall from or off toilet without subsequent striking against object, initial encounter: Secondary | ICD-10-CM | POA: Diagnosis present

## 2020-08-04 DIAGNOSIS — R32 Unspecified urinary incontinence: Secondary | ICD-10-CM | POA: Diagnosis present

## 2020-08-04 DIAGNOSIS — K649 Unspecified hemorrhoids: Secondary | ICD-10-CM | POA: Diagnosis present

## 2020-08-04 DIAGNOSIS — Z955 Presence of coronary angioplasty implant and graft: Secondary | ICD-10-CM

## 2020-08-04 DIAGNOSIS — Z20822 Contact with and (suspected) exposure to covid-19: Secondary | ICD-10-CM | POA: Diagnosis present

## 2020-08-04 LAB — CBC WITH DIFFERENTIAL/PLATELET
Abs Immature Granulocytes: 0.03 10*3/uL (ref 0.00–0.07)
Basophils Absolute: 0 10*3/uL (ref 0.0–0.1)
Basophils Relative: 0 %
Eosinophils Absolute: 0.3 10*3/uL (ref 0.0–0.5)
Eosinophils Relative: 4 %
HCT: 37.8 % (ref 36.0–46.0)
Hemoglobin: 11.9 g/dL — ABNORMAL LOW (ref 12.0–15.0)
Immature Granulocytes: 0 %
Lymphocytes Relative: 33 %
Lymphs Abs: 2.7 10*3/uL (ref 0.7–4.0)
MCH: 33.9 pg (ref 26.0–34.0)
MCHC: 31.5 g/dL (ref 30.0–36.0)
MCV: 107.7 fL — ABNORMAL HIGH (ref 80.0–100.0)
Monocytes Absolute: 0.8 10*3/uL (ref 0.1–1.0)
Monocytes Relative: 9 %
Neutro Abs: 4.4 10*3/uL (ref 1.7–7.7)
Neutrophils Relative %: 54 %
Platelets: 173 10*3/uL (ref 150–400)
RBC: 3.51 MIL/uL — ABNORMAL LOW (ref 3.87–5.11)
RDW: 13.6 % (ref 11.5–15.5)
WBC: 8.2 10*3/uL (ref 4.0–10.5)
nRBC: 0 % (ref 0.0–0.2)

## 2020-08-04 LAB — BASIC METABOLIC PANEL
Anion gap: 9 (ref 5–15)
BUN: 30 mg/dL — ABNORMAL HIGH (ref 8–23)
CO2: 29 mmol/L (ref 22–32)
Calcium: 8.4 mg/dL — ABNORMAL LOW (ref 8.9–10.3)
Chloride: 101 mmol/L (ref 98–111)
Creatinine, Ser: 1.41 mg/dL — ABNORMAL HIGH (ref 0.44–1.00)
GFR, Estimated: 34 mL/min — ABNORMAL LOW (ref >60)
Glucose, Bld: 107 mg/dL — ABNORMAL HIGH (ref 70–99)
Potassium: 3.5 mmol/L (ref 3.5–5.1)
Sodium: 139 mmol/L (ref 135–145)

## 2020-08-04 LAB — POC OCCULT BLOOD, ED: Fecal Occult Bld: NEGATIVE

## 2020-08-04 MED ORDER — SODIUM CHLORIDE 0.9 % IV BOLUS
500.0000 mL | Freq: Once | INTRAVENOUS | Status: AC
  Administered 2020-08-04: 500 mL via INTRAVENOUS

## 2020-08-04 NOTE — ED Triage Notes (Signed)
Pt BIB EMS from [facility] c/o dizziness. Per EMS, pt initial systolic 80 palpated. Last BP 130/80 after 150 cc NS. 22G left wrist. Pt due to see PCP tomorrow for complaint.

## 2020-08-04 NOTE — ED Provider Notes (Signed)
[place] EMERGENCY DEPARTMENT Provider Note  CSN: [id number] Arrival date & time: 08/04/20 2153    History Chief Complaint  Patient presents with  . Dizziness    HPI  [name] is a 87 y.o. female with history of atrial fib on Eliquis brought to the ED via EMS from ALF for re-evaluation of dizziness. The son at the bedside provides much of the history. She apparently had a fall about 4 days ago without significant injury and son declined transport at that time. She has been complaining of dizziness since that time, described both as room spinning and feeling like she's going to pass out, worse with sitting up, not associated with CP or SOB. Maybe some nausea. Yesterday she fell over while on the toilet, striking her head on the countertop and sustaining a laceration. She was seen in the ED, had negative workup, laceration was repaired and she was returned to SNF. She has continued to complain of intermittent dizziness. This afternoon, son reports her BP was 'a little low' and so he scheduled an appointment with PCP for tomorrow to discuss adjusting her meds. This evening, she was still complaining of dizziness and BP was lower and so EMS was called. They reported a BP of 80palp, improved with 150cc NS. No dizziness with lying down and states she wants to go back to bed.    Past Medical History:  Diagnosis Date  . CAD (coronary artery disease)    a. 07/2007 PCI/Promus DES to LAD;  b. 02/2010 PCI/Promus DES to dRCA.;  c. 12/2010 Lexi MV: EF 88%, no isch/infarct.;  d. 02/2012 Cath: LM nl, LAD 20p, patent mid stent, 60d, D1 sm 70ost, D2 sm, diff, LCX sm , OM1 diff 7m (no change), RCA 40ost, patent stent, PD/PL nl, EF 75% w/o rwma's.  . Carotid bruit   . Diverticulosis   . GERD (gastroesophageal reflux disease)   . Hemorrhoids   . History of echocardiogram    a. 06/2012 Echo: EF nl, mild LVH.  Marland Kitchen HLD (hyperlipidemia)   . HTN (hypertension)   . Hypothyroidism   . IBS (irritable bowel  syndrome)     Past Surgical History:  Procedure Laterality Date  . BACK SURGERY    . CORONARY ANGIOPLASTY WITH STENT PLACEMENT    . hysterectomy - unknown type    . LEFT HEART CATHETERIZATION WITH CORONARY ANGIOGRAM N/A 02/13/2012   Procedure: LEFT HEART CATHETERIZATION WITH CORONARY ANGIOGRAM;  Surgeon: Josue Hector, MD;  Location: [facility] CATH LAB;  Service: Cardiovascular;  Laterality: N/A;  . RECTOCELE REPAIR      Family History  Problem Relation Age of Onset  . Myasthenia gravis Mother   . Hip fracture Father   . Heart disease Other        several uncles with CAD    Social History   Tobacco Use  . Smoking status: Never Smoker  . Smokeless tobacco: Never Used  Vaping Use  . Vaping Use: Never used  Substance Use Topics  . Alcohol use: No  . Drug use: No     Home Medications Prior to Admission medications   Medication Sig Start Date End Date Taking? Authorizing Provider  acetaminophen (TYLENOL) 500 MG tablet Take 500 mg by mouth every 8 (eight) hours.    [provider]  acetaminophen (TYLENOL) 500 MG tablet Take 1,000 mg by mouth every 8 (eight) hours as needed (for pain).    [provider]  apixaban (ELIQUIS) 5 MG TABS tablet  Take 1 tablet (5 mg total) by mouth 2 (two) times daily. 02/18/20   Aline August, MD  atorvastatin (LIPITOR) 10 MG tablet Take 1 tablet (10 mg total) by mouth daily. 02/19/20   Aline August, MD  atorvastatin (LIPITOR) 10 MG tablet Take 10 mg by mouth at bedtime.    [provider]  diltiazem (CARDIZEM) 60 MG tablet Take 1 tablet (60 mg total) by mouth 3 (three) times daily. 05/28/20   Shelly Coss, MD  diltiazem (CARDIZEM) 60 MG tablet Take 60 mg by mouth 3 (three) times daily. 07/16/20   [provider]  ELIQUIS 5 MG TABS tablet Take 5 mg by mouth in the morning and at bedtime.    [provider]  furosemide (LASIX) 20 MG tablet Take 1 tablet (20 mg total) by mouth daily. 05/29/20   Shelly Coss, MD   furosemide (LASIX) 20 MG tablet Take 20 mg by mouth in the morning.    [provider]  hydrOXYzine (ATARAX/VISTARIL) 25 MG tablet Take 25 mg by mouth at bedtime as needed for anxiety.  02/03/20   [provider]  hydrOXYzine (VISTARIL) 25 MG capsule Take 25 mg by mouth daily as needed for anxiety.    [provider]  levothyroxine (SYNTHROID) 100 MCG tablet Take 100 mcg by mouth daily before breakfast.    [provider]  levothyroxine (SYNTHROID, LEVOTHROID) 100 MCG tablet Take 1 tablet by mouth daily. 12/13/15   [provider]  metoprolol tartrate (LOPRESSOR) 100 MG tablet Take 100 mg by mouth 2 (two) times daily.    [provider]  metoprolol tartrate (LOPRESSOR) 100 MG tablet Take 100 mg by mouth in the morning and at bedtime.    [provider]  Multiple Vitamin (MULTIVITAMIN) tablet Take 1 tablet by mouth every evening.     [provider]  Multiple Vitamins-Minerals (ONE-A-DAY PROACTIVE 65+ PO) Take 1 tablet by mouth at bedtime.    [provider]  MYRBETRIQ 50 MG TB24 tablet Take 50 mg by mouth every evening.  10/31/19   [provider]  MYRBETRIQ 50 MG TB24 tablet Take 50 mg by mouth at bedtime.    [provider]  nitroGLYCERIN (NITROSTAT) 0.4 MG SL tablet Place 0.4 mg under the tongue every 5 (five) minutes as needed. For chest pain    [provider]  nitroGLYCERIN (NITROSTAT) 0.4 MG SL tablet Place 0.4 mg under the tongue every 5 (five) minutes as needed for chest pain.    [provider]  trimethoprim (TRIMPEX) 100 MG tablet Take 100 mg by mouth daily. 01/21/20   [provider]  trimethoprim (TRIMPEX) 100 MG tablet Take 100 mg by mouth in the morning.    [provider]  vitamin B-12 (CYANOCOBALAMIN) 500 MCG tablet Take 500 mcg by mouth daily.    [provider]  vitamin B-12 (CYANOCOBALAMIN) 500 MCG tablet Take 500 mcg by mouth daily.     [provider]     Allergies    Patient has no known allergies.   Review of Systems   Review of Systems A comprehensive review of systems was completed and negative except as noted in HPI.    Physical Exam BP (!) 143/101   Pulse 79   Temp 97.9 F (36.6 C) (Oral)   Resp (!) 21   Ht 5\' 5"  (1.651 m)   Wt 49.9 kg   SpO2 96%   BMI 18.30 kg/m   Physical Exam Vitals and nursing  note reviewed.  Constitutional:      Appearance: Normal appearance.  HENT:     Head: Normocephalic.     Comments: Old bruising to R face and R wrist, laceration intact    Nose: Nose normal.     Mouth/Throat:     Mouth: Mucous membranes are moist.  Eyes:     Extraocular Movements: Extraocular movements intact.     Conjunctiva/sclera: Conjunctivae normal.  Cardiovascular:     Rate and Rhythm: Normal rate. Rhythm irregular.  Pulmonary:     Effort: Pulmonary effort is normal.     Breath sounds: Normal breath sounds.  Abdominal:     General: Abdomen is flat.     Palpations: Abdomen is soft.     Tenderness: There is no abdominal tenderness.  Musculoskeletal:        General: No swelling. Normal range of motion.     Cervical back: Neck supple.  Skin:    General: Skin is warm and dry.  Neurological:     General: No focal deficit present.     Mental Status: She is alert.     Cranial Nerves: No cranial nerve deficit.     Sensory: No sensory deficit.     Motor: No weakness.     Comments: No nystagmus with sitting up  Psychiatric:        Mood and Affect: Mood normal.      ED Results / Procedures / Treatments   Labs (all labs ordered are listed, but only abnormal results are displayed) Labs Reviewed  CBC WITH DIFFERENTIAL/PLATELET - Abnormal; Notable for the following components:      Result Value   RBC 3.51 (*)    Hemoglobin 11.9 (*)    MCV 107.7 (*)    All other components within normal limits  BASIC METABOLIC PANEL - Abnormal; Notable for the following components:   Glucose,  Bld 107 (*)    BUN 30 (*)    Creatinine, Ser 1.41 (*)    Calcium 8.4 (*)    GFR, Estimated 34 (*)    All other components within normal limits  SARS CORONAVIRUS 2 (TAT 6-24 HRS)  URINALYSIS, ROUTINE W REFLEX MICROSCOPIC  POC OCCULT BLOOD, ED    EKG EKG Interpretation  Date/Time:  Tuesday August 04 2020 22:07:15 EDT Ventricular Rate:  86 PR Interval:    QRS Duration: 69 QT Interval:  370 QTC Calculation: 443 R Axis:   78 Text Interpretation: Atrial fibrillation Anteroseptal infarct, old Borderline repolarization abnormality No significant change since last tracing Confirmed by Calvert Cantor [phone]) on 08/04/2020 11:01:25 PM   Radiology DG Sacrum/Coccyx  Result Date: 08/03/2020 CLINICAL DATA:  87 year old post fall. Fall off toilet this morning. EXAM: SACRUM AND COCCYX - 2+ VIEW COMPARISON:  Reformats from abdominopelvic CT 05/26/2020 FINDINGS: There is no evidence of fracture or other focal bone lesions. Cortical margins of the sacrum and coccyx appear intact. Bones are diffusely under mineralized. Sacroiliac joints are congruent. No sacral alar disruption. IMPRESSION: No evidence of sacral or coccygeal fracture. Electronically Signed   By: Keith Rake M.D.   On: 08/03/2020 15:01   CT Head Wo Contrast  Result Date: 08/05/2020 CLINICAL DATA:  Dizziness.  Suspected head trauma. EXAM: CT HEAD WITHOUT CONTRAST TECHNIQUE: Contiguous axial images were obtained from the base of the skull through the vertex without intravenous contrast. COMPARISON:  MRI brain 05/27/2020.  CT head 05/26/2020 FINDINGS: Brain: No evidence of acute infarction, hemorrhage, hydrocephalus, extra-axial collection or mass lesion/mass effect. Diffuse  cerebral atrophy. Ventricular dilatation likely due to central atrophy. Low-attenuation changes in the deep white matter likely due to small vessel ischemia. Vascular: Prominent intracranial arterial vascular calcifications. Skull: Calvarium appears intact.  Sinuses/Orbits: Paranasal sinuses and mastoid air cells are clear. Other: None. IMPRESSION: 1. No acute intracranial abnormalities. 2. Chronic atrophy and small vessel ischemic changes. Electronically Signed   By: Lucienne Capers M.D.   On: 08/05/2020 01:03   CT Head Wo Contrast  Result Date: 08/03/2020 CLINICAL DATA:  Status post fall.  Initial encounter. EXAM: CT HEAD WITHOUT CONTRAST CT CERVICAL SPINE WITHOUT CONTRAST TECHNIQUE: Multidetector CT imaging of the head and cervical spine was performed following the standard protocol without intravenous contrast. Multiplanar CT image reconstructions of the cervical spine were also generated. COMPARISON:  None. FINDINGS: CT HEAD FINDINGS Brain: No evidence of acute infarction, hemorrhage, hydrocephalus, extra-axial collection or mass lesion/mass effect. Atrophy and chronic microvascular ischemic change noted. Vascular: No hyperdense vessel or unexpected calcification. Skull: Intact.  No focal lesion. Sinuses/Orbits: Mild mucosal thickening right sphenoid sinus. Very small mucous retention cyst or polyp left maxillary sinus. Other: None. CT CERVICAL SPINE FINDINGS Alignment: Mild reversal of the normal cervical lordosis. Trace anterolisthesis C4 on C5 and C7 on T1 is due to facet degenerative disease. Skull base and vertebrae: No acute fracture. No primary bone lesion or focal pathologic process. Failure fusion of the posterior arch of C1 incidentally noted. Soft tissues and spinal canal: Negative. Disc levels:  Marked multilevel loss of disc space height. Upper chest: Calcified granuloma left upper lobe noted. Other: None. IMPRESSION: No acute abnormality head or cervical spine. Atrophy and chronic microvascular ischemic change. Multilevel cervical spondylosis. Mild sinus disease. Electronically Signed   By: Inge Rise M.D.   On: 08/03/2020 14:13   CT Cervical Spine Wo Contrast  Result Date: 08/03/2020 CLINICAL DATA:  Status post fall.  Initial  encounter. EXAM: CT HEAD WITHOUT CONTRAST CT CERVICAL SPINE WITHOUT CONTRAST TECHNIQUE: Multidetector CT imaging of the head and cervical spine was performed following the standard protocol without intravenous contrast. Multiplanar CT image reconstructions of the cervical spine were also generated. COMPARISON:  None. FINDINGS: CT HEAD FINDINGS Brain: No evidence of acute infarction, hemorrhage, hydrocephalus, extra-axial collection or mass lesion/mass effect. Atrophy and chronic microvascular ischemic change noted. Vascular: No hyperdense vessel or unexpected calcification. Skull: Intact.  No focal lesion. Sinuses/Orbits: Mild mucosal thickening right sphenoid sinus. Very small mucous retention cyst or polyp left maxillary sinus. Other: None. CT CERVICAL SPINE FINDINGS Alignment: Mild reversal of the normal cervical lordosis. Trace anterolisthesis C4 on C5 and C7 on T1 is due to facet degenerative disease. Skull base and vertebrae: No acute fracture. No primary bone lesion or focal pathologic process. Failure fusion of the posterior arch of C1 incidentally noted. Soft tissues and spinal canal: Negative. Disc levels:  Marked multilevel loss of disc space height. Upper chest: Calcified granuloma left upper lobe noted. Other: None. IMPRESSION: No acute abnormality head or cervical spine. Atrophy and chronic microvascular ischemic change. Multilevel cervical spondylosis. Mild sinus disease. Electronically Signed   By: Inge Rise M.D.   On: 08/03/2020 14:13    Procedures Procedures  Medications Ordered in the ED Medications  sodium chloride 0.9 % bolus 500 mL (0 mLs Intravenous Stopped 08/05/20 0056)     MDM Rules/Calculators/A&P MDM Patient with persistent positional dizziness after recent fall. Head CT yesterday was neg for intracranial bleed. Today labs show significant change in Hgb since yesterday from 15.3 to 11.9. Denies any  bloody or melanic stools. Will check hemoccult, awaiting urine and  chemistry.   ED Course  I have reviewed the triage vital signs and the nursing notes.  Pertinent labs & imaging results that were available during my care of the patient were reviewed by me and considered in my medical decision making (see chart for details).  Clinical Course as of 08/05/20 0112  Tue Aug 04, 2020  2335 Hemoccult is negative. BMP shows worsening renal function. Given continued dizziness, will recheck head CT for delayed bleed. Plan admission for further evaluation of drop in Hgb, worsening renal function and persistent dizziness.  [CS]  Wed Aug 05, 2020  0105 Repeat CT still neg. Will discuss admission with hospitalist for admission.  [CS]  0109 Spoke with Dr. Hal Hope, Hospitalist, who will evaluate for admission.  [CS]    Clinical Course User Index [CS] Truddie Hidden, MD    Final Clinical Impression(s) / ED Diagnoses Final diagnoses:  Dizziness  Anemia, unspecified type  AKI (acute kidney injury) [facility])    Rx / DC Orders ED Discharge Orders    None       Truddie Hidden, MD 08/05/20 [phone]

## 2020-08-05 ENCOUNTER — Other Ambulatory Visit: Payer: Self-pay

## 2020-08-05 ENCOUNTER — Encounter (HOSPITAL_COMMUNITY): Payer: Self-pay | Admitting: Internal Medicine

## 2020-08-05 ENCOUNTER — Emergency Department (HOSPITAL_COMMUNITY): Payer: PPO

## 2020-08-05 DIAGNOSIS — R55 Syncope and collapse: Secondary | ICD-10-CM | POA: Diagnosis not present

## 2020-08-05 DIAGNOSIS — N179 Acute kidney failure, unspecified: Secondary | ICD-10-CM | POA: Diagnosis not present

## 2020-08-05 DIAGNOSIS — D649 Anemia, unspecified: Secondary | ICD-10-CM | POA: Diagnosis not present

## 2020-08-05 DIAGNOSIS — R42 Dizziness and giddiness: Secondary | ICD-10-CM | POA: Diagnosis not present

## 2020-08-05 LAB — CBC
HCT: 44.8 % (ref 36.0–46.0)
Hemoglobin: 14.5 g/dL (ref 12.0–15.0)
MCH: 34.6 pg — ABNORMAL HIGH (ref 26.0–34.0)
MCHC: 32.4 g/dL (ref 30.0–36.0)
MCV: 106.9 fL — ABNORMAL HIGH (ref 80.0–100.0)
Platelets: 210 10*3/uL (ref 150–400)
RBC: 4.19 MIL/uL (ref 3.87–5.11)
RDW: 13.6 % (ref 11.5–15.5)
WBC: 8.4 10*3/uL (ref 4.0–10.5)
nRBC: 0 % (ref 0.0–0.2)

## 2020-08-05 LAB — BASIC METABOLIC PANEL
Anion gap: 11 (ref 5–15)
BUN: 28 mg/dL — ABNORMAL HIGH (ref 8–23)
CO2: 28 mmol/L (ref 22–32)
Calcium: 9.2 mg/dL (ref 8.9–10.3)
Chloride: 100 mmol/L (ref 98–111)
Creatinine, Ser: 1.33 mg/dL — ABNORMAL HIGH (ref 0.44–1.00)
GFR, Estimated: 36 mL/min — ABNORMAL LOW (ref >60)
Glucose, Bld: 103 mg/dL — ABNORMAL HIGH (ref 70–99)
Potassium: 4 mmol/L (ref 3.5–5.1)
Sodium: 139 mmol/L (ref 135–145)

## 2020-08-05 LAB — SARS CORONAVIRUS 2 (TAT 6-24 HRS): SARS Coronavirus 2: NEGATIVE

## 2020-08-05 LAB — TSH: TSH: 26.4 u[IU]/mL — ABNORMAL HIGH (ref 0.350–4.500)

## 2020-08-05 MED ORDER — CYANOCOBALAMIN 500 MCG PO TABS
500.0000 ug | ORAL_TABLET | Freq: Every day | ORAL | Status: DC
  Administered 2020-08-05 – 2020-08-11 (×7): 500 ug via ORAL
  Filled 2020-08-05 (×8): qty 1

## 2020-08-05 MED ORDER — HYDROXYZINE HCL 25 MG PO TABS: 25.0000 mg | ORAL_TABLET | Freq: Every evening | ORAL | Status: DC | PRN

## 2020-08-05 MED ORDER — METOPROLOL TARTRATE 50 MG PO TABS
100.0000 mg | ORAL_TABLET | Freq: Two times a day (BID) | ORAL | Status: DC
  Administered 2020-08-05 – 2020-08-11 (×13): 100 mg via ORAL
  Filled 2020-08-05 (×14): qty 2

## 2020-08-05 MED ORDER — TRIMETHOPRIM 100 MG PO TABS
100.0000 mg | ORAL_TABLET | Freq: Every day | ORAL | Status: DC
  Administered 2020-08-05 – 2020-08-11 (×7): 100 mg via ORAL
  Filled 2020-08-05 (×8): qty 1

## 2020-08-05 MED ORDER — APIXABAN 5 MG PO TABS
5.0000 mg | ORAL_TABLET | Freq: Two times a day (BID) | ORAL | Status: DC
  Administered 2020-08-05: 5 mg via ORAL
  Filled 2020-08-05 (×2): qty 1

## 2020-08-05 MED ORDER — DILTIAZEM HCL 60 MG PO TABS
60.0000 mg | ORAL_TABLET | Freq: Three times a day (TID) | ORAL | Status: DC
  Administered 2020-08-05 – 2020-08-11 (×20): 60 mg via ORAL
  Filled 2020-08-05 (×21): qty 1

## 2020-08-05 MED ORDER — ACETAMINOPHEN 325 MG PO TABS
650.0000 mg | ORAL_TABLET | Freq: Four times a day (QID) | ORAL | Status: DC | PRN
  Administered 2020-08-05 – 2020-08-11 (×10): 650 mg via ORAL
  Filled 2020-08-05 (×9): qty 2

## 2020-08-05 MED ORDER — LEVOTHYROXINE SODIUM 100 MCG PO TABS
100.0000 ug | ORAL_TABLET | Freq: Every day | ORAL | Status: DC
  Administered 2020-08-06 – 2020-08-11 (×6): 100 ug via ORAL
  Filled 2020-08-05 (×6): qty 1

## 2020-08-05 MED ORDER — ACETAMINOPHEN 650 MG RE SUPP: 650.0000 mg | Freq: Four times a day (QID) | RECTAL | Status: DC | PRN

## 2020-08-05 MED ORDER — APIXABAN 2.5 MG PO TABS
2.5000 mg | ORAL_TABLET | Freq: Two times a day (BID) | ORAL | Status: DC
  Administered 2020-08-05 – 2020-08-11 (×12): 2.5 mg via ORAL
  Filled 2020-08-05 (×12): qty 1

## 2020-08-05 MED ORDER — NITROGLYCERIN 0.4 MG SL SUBL: 0.4000 mg | SUBLINGUAL_TABLET | SUBLINGUAL | Status: DC | PRN

## 2020-08-05 MED ORDER — MIRABEGRON ER 25 MG PO TB24
50.0000 mg | ORAL_TABLET | Freq: Every day | ORAL | Status: DC
  Administered 2020-08-05 – 2020-08-10 (×6): 50 mg via ORAL
  Filled 2020-08-05 (×7): qty 2

## 2020-08-05 MED ORDER — ATORVASTATIN CALCIUM 10 MG PO TABS
10.0000 mg | ORAL_TABLET | Freq: Every day | ORAL | Status: DC
  Administered 2020-08-05 – 2020-08-10 (×6): 10 mg via ORAL
  Filled 2020-08-05 (×6): qty 1

## 2020-08-05 NOTE — Evaluation (Signed)
Occupational Therapy Evaluation Patient Details Name: [name] MRN: [id number] DOB: Feb 24, 1921 Today's Date: 08/05/2020    History of Present Illness 87 yo female admitted with syncope, fall, dizziness, R head laceration. Hx of A fib, CAD, CHF, pleural effusions   Clinical Impression   [name] is a 87 year old woman admitted to hospital with above medical history. On initial transfer into sitting patient immediately complaining of being dizzy. BP 109/68 in sitting and 93/72 with standing with patient complaining of increased dizziness. Patient exhibits overall functional ROM of upper extremities with left shoulder ROM decreased due to history of humeral fracture. Overall patient exhibits generalized weakness but able to use upper extremities functionally. No focal neurological deficits noted. Patient able to stand with min guard and ambulate to bathroom with RW without overt loss of balance. Patient able to perform toileting and donning socks. Patient seated in recliner and BP 100/59 after activity. Patient reports pain in bottom with sitting. With further report patient states she's been dizzy since her first fall. Patient returned to supine to assess if patient would become dizzy with transfer. On return to supine patient immediately complains of dizziness and nystagmus noted in left eye. Md and RN notified. Patient will benefit from skilled OT services while in hospital to improve safety, mobility and activity tolerance in order to return to ALF at discharge. Suspect patient should improve when vertigo symptoms resolve. Will need assistance of one for all standing and ambulation activities due to dizziness.     Follow Up Recommendations  No OT follow up    Equipment Recommendations  None recommended by OT    Recommendations for Other Services       Precautions / Restrictions Precautions Precautions: Fall Precaution Comments: dizziness Restrictions Weight Bearing  Restrictions: No      Mobility Bed Mobility Overal bed mobility: Needs Assistance Bed Mobility: Supine to Sit Rolling: Modified independent (Device/Increase time)   Supine to sit: Min assist Sit to supine: Supervision   General bed mobility comments: Patient min assist to transfer into sitting. Immediately begins to complain of dizziness halfway through sitting up. On return to bed patient supervision to return to bed.    Transfers Overall transfer level: Needs assistance Equipment used: Rolling walker (2 wheeled) Transfers: Sit to/from Stand Sit to Stand: Min guard         General transfer comment: Min guard to ambulate to bathroom with RW and return to recliner. No overt loss of balance with ambulation but complaining of dizziness.    Balance Overall balance assessment: Needs assistance;History of Falls Sitting-balance support: No upper extremity supported Sitting balance-Leahy Scale: Fair     Standing balance support: During functional activity Standing balance-Leahy Scale: Fair Standing balance comment: able to manage clothing with hands off of walker                           ADL either performed or assessed with clinical judgement   ADL Overall ADL's : Needs assistance/impaired Eating/Feeding: Set up;Sitting   Grooming: Set up;Sitting   Upper Body Bathing: Set up;Sitting   Lower Body Bathing: Min guard;Sit to/from stand   Upper Body Dressing : Set up;Sitting   Lower Body Dressing: Set up;Min guard Lower Body Dressing Details (indicate cue type and reason): able to don socks, manage clothing with sit to stand Toilet Transfer: Min guard;RW;Grab Corporate investment banker- Clothing Manipulation and Hygiene: Min guard;Sit to/from stand  Vision Patient Visual Report: No change from baseline       Perception     Praxis      Pertinent Vitals/Pain Pain Assessment: Faces Faces Pain Scale: Hurts a little bit Pain  Location: buttocks Pain Descriptors / Indicators: Discomfort;Grimacing;Sore;Guarding Pain Intervention(s): Monitored during session;Patient requesting pain meds-RN notified     Hand Dominance Right   Extremity/Trunk Assessment Upper Extremity Assessment Upper Extremity Assessment: Generalized weakness;RUE deficits/detail;LUE deficits/detail RUE Sensation: WNL RUE Coordination: WNL LUE Deficits / Details: Reports hx of left arm fracutre (exhibtis some decreased ROM in shoulder and weakness compared to right) LUE Sensation: WNL LUE Coordination: WNL   Lower Extremity Assessment Lower Extremity Assessment: Defer to PT evaluation   Cervical / Trunk Assessment Cervical / Trunk Assessment: Normal   Communication Communication Communication: No difficulties   Cognition Arousal/Alertness: Awake/alert Behavior During Therapy: WFL for tasks assessed/performed Overall Cognitive Status: Within Functional Limits for tasks assessed                                 General Comments: Alert to self, month, year, 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   General Comments       Exercises     Shoulder Instructions      Home Living Family/patient expects to be discharged to:: Other (Comment) (ALF)                             Home Equipment: Walker - 4 wheels   Additional Comments: Patient from ALF.      Prior Functioning/Environment Level of Independence: Needs assistance  Gait / Transfers Assistance Needed: Uses rollator for ambulation. Reports she is supposed to have assistance when she gets up but sometimes she gets up by herself. ADL's / Homemaking Assistance Needed: Has assistance for bathing. Is able to don clothing laid out for her. reports "they" help her to the bathroom. Reports she's not supposed to go by herself.            OT Problem List: Decreased activity tolerance;Impaired balance (sitting and/or standing);Decreased safety awareness       OT Treatment/Interventions: Self-care/ADL training;DME and/or AE instruction;Therapeutic activities;Balance training;Patient/family education    OT Goals(Current goals can be found in the care plan section) Acute Rehab OT Goals Patient Stated Goal: walk without getting dizzy OT Goal Formulation: With patient Time For Goal Achievement: 08/19/20 Potential to Achieve Goals: Fair  OT Frequency: Min 2X/week   Barriers to D/C:            Co-evaluation              AM-PAC OT "6 Clicks" Daily Activity     Outcome Measure Help from another person eating meals?: A Little Help from another person taking care of personal grooming?: A Little Help from another person toileting, which includes using toliet, bedpan, or urinal?: A Little Help from another person bathing (including washing, rinsing, drying)?: A Little Help from another person to put on and taking off regular upper body clothing?: A Little Help from another person to put on and taking off regular lower body clothing?: A Little 6 Click Score: 18   End of Session Equipment Utilized During Treatment: Rolling walker Nurse Communication: Mobility status  Activity Tolerance: Patient tolerated treatment well Patient left: in chair;with call bell/phone within reach;with chair alarm set  OT Visit Diagnosis: Unsteadiness on feet (R26.81);BPPV;Dizziness  and giddiness (R42) BPPV - Right/Left: Left                Time: 7741-2878 OT Time Calculation (min): 30 min Charges:  OT General Charges $OT Visit: 1 Visit OT Evaluation $OT Eval Moderate Complexity: 1 Mod OT Treatments $Self Care/Home Management : 8-22 mins  Female Iafrate, OTR/L [place]  Office [phone] Pager: [phone]   Lenward Chancellor 08/05/2020, 4:26 PM

## 2020-08-05 NOTE — Progress Notes (Signed)
PROGRESS NOTE  [name]:[id number] DOB: 08-31-1920   PCP: Holland Commons, FNP  Patient is from: ALF  DOA: 08/04/2020 LOS: 0  Chief complaints: Dizziness, fall  Brief Narrative / Interim history: 87 year old F with PMH of diastolic CHF, A. fib on Eliquis, CAD, PE and hypothyroidism return to ED with dizziness.  Patient was seen in ED due to forehead laceration after fall at ALF the day prior to admission and discharged back to ALF but continued to have dizziness and brought back to ED.  In ED, CTH without acute finding.  EKG with rate controlled A. Fib.  Hgb 11 (baseline~14).  Fecal Hemoccult negative.  She was admitted with working diagnosis of near syncope for observation.  Subjective: Seen and examined earlier this morning.  No major events overnight of this morning.  Reports dizziness that she describes as things are spinning with movement.  She also reports bifrontal headache that she attributes to fall.  Denies vision change, tinnitus, nausea, vomiting, focal numbness or weakness.  Objective: Vitals:   08/05/20 0931 08/05/20 0935 08/05/20 0939 08/05/20 1459  BP: 124/82 123/86 121/76 112/69  Pulse: 90 94 98 89  Resp:    18  Temp:    97.7 F (36.5 C)  TempSrc:    Oral  SpO2:    (!) 86%  Weight:      Height:        Intake/Output Summary (Last 24 hours) at 08/05/2020 1805 Last data filed at 08/05/2020 0056 Gross per 24 hour  Intake 500 ml  Output --  Net 500 ml   Filed Weights   08/04/20 2209  Weight: 49.9 kg    Examination:  GENERAL: No apparent distress.  Nontoxic. HEENT: MMM.  Laceration over right frontal head with 5 interrupted sutures.  Bruising about right eye NECK: Supple.  No apparent JVD.  RESP:  No IWOB.  Fair aeration bilaterally. CVS: Irregular rhythm.  Normal rate.  Heart sounds normal.  ABD/GI/GU: BS+. Abd soft, NTND.  MSK/EXT:  Moves extremities. No apparent deformity. No edema.  SKIN: no apparent skin lesion or wound NEURO: Awake,  alert and oriented appropriately.  PERRL.  EOMI. Right beating nystagmus.  Finger-to-nose intact.  No pronator drift.  Motor strength symmetric in all extremities.  Patellar reflex symmetric PSYCH: Calm. Normal affect.  Procedures:  None  Microbiology summarized: COVID-19 PCR nonreactive.  Assessment & Plan: Dizziness/vertigo-due to BPPV.  She has right beating nystagmus on exam but no other focal neurodeficit.  Orthostatic vitals negative.  No significant finding on EKG. -Appreciate help by vestibular PT-recommended SNF versus HH PT with 24-hour supervision  Fall at ALF Skin laceration over right forehead -Likely due to the above. -Fall precaution -PT/OT eval -Five interrupted sutures on 4/25 in ED. needs suture removal on 4/30  Permanent A. Fib: Rate controlled.  CHA2DS2-VASc score 6. -Continue home metoprolol and Cardizem -Low-dose Eliquis  History of PE-diagnosed in 02/2020.  Unclear if it is provoked or unprovoked. -Low-dose Eliquis as above  Chronic diastolic CHF: TTE in 44/0102 with LVEF of 60 to 65% and RVSP of 54.5 mmHg.  On p.o. Lasix 20 mg daily at home.  Appears euvolemic. -Continue holding Lasix -Monitor respiratory and fluid status  History of CAD s/p DES to LAD and dRCA no chest pain.  No acute ischemic finding on EKG. -Continue metoprolol tartrate and statin  Hypothyroidism -Continue home Synthroid  Urinary incontinence/recurrent UTI -Continue home trimethoprim and Myrbetriq  Underweight Body mass index is 18.3 kg/m.  DVT prophylaxis:  apixaban (ELIQUIS) tablet 2.5 mg Start: 08/05/20 2200 apixaban (ELIQUIS) tablet 2.5 mg  Code Status: DNR/DNI Family Communication: Attempted to call patient's son for update but no answer. Level of care: Telemetry Status is: Observation  The patient remains OBS appropriate and will d/c before 2 midnights.  Dispo: The patient is from: ALF              Anticipated d/c is to: ALF              Patient  currently is not medically stable to d/c.   Difficult to place patient No       Consultants:  None   Sch Meds:  Scheduled Meds: . apixaban  2.5 mg Oral BID  . atorvastatin  10 mg Oral QHS  . diltiazem  60 mg Oral TID  . levothyroxine  100 mcg Oral Daily  . metoprolol tartrate  100 mg Oral BID  . mirabegron ER  50 mg Oral QHS  . trimethoprim  100 mg Oral Daily  . vitamin B-12  500 mcg Oral Daily   Continuous Infusions: PRN Meds:.acetaminophen **OR** acetaminophen, hydrOXYzine, nitroGLYCERIN  Antimicrobials: Anti-infectives (From admission, onward)   Start     Dose/Rate Route Frequency Ordered Stop   08/05/20 1000  trimethoprim (TRIMPEX) tablet 100 mg        100 mg Oral Daily 08/05/20 0317         I have personally reviewed the following labs and images: CBC: Recent Labs  Lab 08/03/20 1215 08/04/20 2237 08/05/20 0625  WBC 11.9* 8.2 8.4  NEUTROABS 8.1* 4.4  --   HGB 15.3* 11.9* 14.5  HCT 48.0* 37.8 44.8  MCV 108.1* 107.7* 106.9*  PLT 202 173 210   BMP &GFR Recent Labs  Lab 08/03/20 1215 08/04/20 2237 08/05/20 0625  NA 138 139 139  K 4.8 3.5 4.0  CL 99 101 100  CO2 27 29 28   GLUCOSE 100* 107* 103*  BUN 18 30* 28*  CREATININE 1.25* 1.41* 1.33*  CALCIUM 9.3 8.4* 9.2   Estimated Creatinine Clearance: 19 mL/min (A) (by C-G formula based on SCr of 1.33 mg/dL (H)). Liver & Pancreas: No results for input(s): AST, ALT, ALKPHOS, BILITOT, PROT, ALBUMIN in the last 168 hours. No results for input(s): LIPASE, AMYLASE in the last 168 hours. No results for input(s): AMMONIA in the last 168 hours. Diabetic: No results for input(s): HGBA1C in the last 72 hours. No results for input(s): GLUCAP in the last 168 hours. Cardiac Enzymes: No results for input(s): CKTOTAL, CKMB, CKMBINDEX, TROPONINI in the last 168 hours. No results for input(s): PROBNP in the last 8760 hours. Coagulation Profile: No results for input(s): INR, PROTIME in the last 168 hours. Thyroid  Function Tests: Recent Labs    08/05/20 0625  TSH 26.400*   Lipid Profile: No results for input(s): CHOL, HDL, LDLCALC, TRIG, CHOLHDL, LDLDIRECT in the last 72 hours. Anemia Panel: No results for input(s): VITAMINB12, FOLATE, FERRITIN, TIBC, IRON, RETICCTPCT in the last 72 hours. Urine analysis:    Component Value Date/Time   COLORURINE AMBER (A) 05/26/2020 1710   APPEARANCEUR HAZY (A) 05/26/2020 1710   LABSPEC 1.027 05/26/2020 1710   PHURINE 5.0 05/26/2020 1710   GLUCOSEU NEGATIVE 05/26/2020 1710   HGBUR NEGATIVE 05/26/2020 1710   BILIRUBINUR NEGATIVE 05/26/2020 1710   BILIRUBINUR negative 02/10/2020 1608   KETONESUR 5 (A) 05/26/2020 1710   PROTEINUR 30 (A) 05/26/2020 1710   UROBILINOGEN 1.0 02/10/2020 1608   UROBILINOGEN 0.2  02/13/2012 0221   NITRITE NEGATIVE 05/26/2020 1710   LEUKOCYTESUR NEGATIVE 05/26/2020 1710   Sepsis Labs: Invalid input(s): PROCALCITONIN, [place]  Microbiology: Recent Results (from the past 240 hour(s))  SARS CORONAVIRUS 2 (TAT 6-24 HRS) Nasopharyngeal Nasopharyngeal Swab     Status: None   Collection Time: 08/05/20  1:11 AM   Specimen: Nasopharyngeal Swab  Result Value Ref Range Status   SARS Coronavirus 2 NEGATIVE NEGATIVE Final    Comment: (NOTE) SARS-CoV-2 target nucleic acids are NOT DETECTED.  The SARS-CoV-2 RNA is generally detectable in upper and lower respiratory specimens during the acute phase of infection. Negative results do not preclude SARS-CoV-2 infection, do not rule out co-infections with other pathogens, and should not be used as the sole basis for treatment or other patient management decisions. Negative results must be combined with clinical observations, patient history, and epidemiological information. The expected result is Negative.  Fact Sheet for Patients: SugarRoll.be  Fact Sheet for Healthcare Providers: https://www.woods-mathews.com/  This test is not yet approved  or cleared by the [place] FDA and  has been authorized for detection and/or diagnosis of SARS-CoV-2 by FDA under an Emergency Use Authorization (EUA). This EUA will remain  in effect (meaning this test can be used) for the duration of the COVID-19 declaration under Se ction 564(b)(1) of the Act, 21 U.S.C. section 360bbb-3(b)(1), unless the authorization is terminated or revoked sooner.  Performed at [facility], [place] [address], [place], [place] [address]     Radiology Studies: CT Head Wo Contrast  Result Date: 08/05/2020 CLINICAL DATA:  Dizziness.  Suspected head trauma. EXAM: CT HEAD WITHOUT CONTRAST TECHNIQUE: Contiguous axial images were obtained from the base of the skull through the vertex without intravenous contrast. COMPARISON:  MRI brain 05/27/2020.  CT head 05/26/2020 FINDINGS: Brain: No evidence of acute infarction, hemorrhage, hydrocephalus, extra-axial collection or mass lesion/mass effect. Diffuse cerebral atrophy. Ventricular dilatation likely due to central atrophy. Low-attenuation changes in the deep white matter likely due to small vessel ischemia. Vascular: Prominent intracranial arterial vascular calcifications. Skull: Calvarium appears intact. Sinuses/Orbits: Paranasal sinuses and mastoid air cells are clear. Other: None. IMPRESSION: 1. No acute intracranial abnormalities. 2. Chronic atrophy and small vessel ischemic changes. Electronically Signed   By: Lucienne Capers M.D.   On: 08/05/2020 01:03    No charge service.  Admited after midnight   Teresa Nicodemus T. Dazey  If 7PM-7AM, please contact night-coverage [email or web address] 08/05/2020, 6:05 PM

## 2020-08-05 NOTE — Evaluation (Signed)
Physical Therapy Evaluation Patient Details Name: [name] MRN: [id number] DOB: [birth year] Today's Date: 08/05/2020   History of Present Illness  87 yo female admitted with syncope, fall, dizziness, R head laceration. Hx of A fib, CAD, CHF, pleural effusions  Clinical Impression  On eval, pt required Min assist for mobility. She walked ~12 feet x 2 with a RW. Pt c/o dizziness throughout session. She also c/o pain in her buttocks. Pt presents with general weakness, decreased activity tolerance, and impaired gait and balance. Unsure of d/c plan at this time-will depend on family decision and assist available at ALF. Pt remains at high risk for falls at this time. Highly recommend 24/7 supervision/assist.  Will plan to continue vestibular assessment/tx on tomorrow as able.   Limited vestibular evaluation: Modified Hallpike Dix: Right Side- (+) dizziness, (+) nystagmus ~15-20 seconds                                    Left Side- (+) dizziness, (+) nystagmus ~8-10 seconds (+) dizziness with bed mobility: supine<>sit; no nystagmus observed Pt also c/o dizziness and tended to shut eyes with quick head movements.  Smooth pursuit (-). (-) dizziness, nystagmus with head shaking     Follow Up Recommendations SNF vs Home health PT;Supervision/Assistance - 24 hour at ALF (depending on pt and family decision)    Equipment Recommendations  None recommended by PT    Recommendations for Other Services       Precautions / Restrictions Precautions Precautions: Fall Precaution Comments: dizziness Restrictions Weight Bearing Restrictions: No      Mobility  Bed Mobility Overal bed mobility: Needs Assistance Bed Mobility: Rolling;Supine to Sit;Sit to Supine Rolling: Modified independent (Device/Increase time)   Supine to sit: Min assist;HOB elevated Sit to supine: Min assist;HOB elevated   General bed mobility comments: A to steady 2* dizziness.    Transfers Overall transfer level:  Needs assistance Equipment used: Rolling walker (2 wheeled) Transfers: Sit to/from Stand Sit to Stand: Min assist         General transfer comment: A to rise, steady, control descent. Cues for safety, slow movement. Pt c/o dizziness.  Ambulation/Gait Ambulation/Gait assistance: Min assist Gait Distance (Feet): 12 Feet (x2) Assistive device: Rolling walker (2 wheeled) Gait Pattern/deviations: Step-through pattern;Decreased stride length     General Gait Details: A to stabilize throughout distance. Pt c/o dizziness. Cues for safety.  Stairs            Wheelchair Mobility    Modified Rankin (Stroke Patients Only)       Balance Overall balance assessment: Needs assistance;History of Falls         Standing balance support: Bilateral upper extremity supported Standing balance-Leahy Scale: Poor                               Pertinent Vitals/Pain Pain Assessment: Faces Faces Pain Scale: Hurts even more Pain Location: buttocks Pain Descriptors / Indicators: Discomfort;Grimacing;Sore;Guarding Pain Intervention(s): Limited activity within patient's tolerance;Monitored during session;Repositioned    Home Living Family/patient expects to be discharged to:: Unsure               Home Equipment: Gilford Rile - 2 wheels      Prior Function Level of Independence: Needs assistance   Gait / Transfers Assistance Needed: uses RW for ambulation  Hand Dominance        Extremity/Trunk Assessment   Upper Extremity Assessment Upper Extremity Assessment: Defer to OT evaluation    Lower Extremity Assessment Lower Extremity Assessment: Generalized weakness    Cervical / Trunk Assessment Cervical / Trunk Assessment: Normal  Communication   Communication: No difficulties  Cognition Arousal/Alertness: Awake/alert Behavior During Therapy: WFL for tasks assessed/performed Overall Cognitive Status: Within Functional Limits for tasks assessed                                         General Comments      Exercises     Assessment/Plan    PT Assessment Patient needs continued PT services  PT Problem List Decreased strength;Decreased mobility;Decreased balance;Decreased activity tolerance;Decreased knowledge of use of DME;Pain       PT Treatment Interventions DME instruction;Gait training;Therapeutic exercise;Balance training;Functional mobility training;Therapeutic activities;Patient/family education    PT Goals (Current goals can be found in the Care Plan section)  Acute Rehab PT Goals Patient Stated Goal: to get better. less pain. PT Goal Formulation: With patient Time For Goal Achievement: 08/19/20 Potential to Achieve Goals: Good    Frequency Min 3X/week   Barriers to discharge        Co-evaluation               AM-PAC PT "6 Clicks" Mobility  Outcome Measure Help needed turning from your back to your side while in a flat bed without using bedrails?: A Little Help needed moving from lying on your back to sitting on the side of a flat bed without using bedrails?: A Little Help needed moving to and from a bed to a chair (including a wheelchair)?: A Little Help needed standing up from a chair using your arms (e.g., wheelchair or bedside chair)?: A Little Help needed to walk in hospital room?: A Little Help needed climbing 3-5 steps with a railing? : A Lot 6 Click Score: 17    End of Session Equipment Utilized During Treatment: Gait belt Activity Tolerance: Patient limited by fatigue (limited by dizziness) Patient left: in bed;with call bell/phone within reach;with bed alarm set   PT Visit Diagnosis: History of falling (Z91.81);Repeated falls (R29.6);Muscle weakness (generalized) (M62.81);Difficulty in walking, not elsewhere classified (R26.2)    Time: 7846-9629 PT Time Calculation (min) (ACUTE ONLY): 23 min   Charges:   PT Evaluation $PT Eval Moderate Complexity: 1 Mod PT  Treatments $Gait Training: 8-22 mins           Doreatha Massed, PT Acute Rehabilitation  Office: [phone] Pager: [phone]

## 2020-08-05 NOTE — Care Management Obs Status (Signed)
Grandfather NOTIFICATION   Patient Details  Name: MAURISA TESMER MRN: [id number] Date of Birth: 09/18/1920   Medicare Observation Status Notification Given:  Yes    MahabirJuliann Pulse, RN 08/05/2020, 10:27 AM

## 2020-08-05 NOTE — H&P (Signed)
History and Physical    [name] ASN:[id number] DOB: June 14, 1920 DOA: 08/04/2020  PCP: Holland Commons, FNP  Patient coming from: Skilled nursing facility.  Chief Complaint: Fall.  HPI: [name] is a 87 y.o. female with history of A. fib, CHF, hypothyroidism, CAD was brought to the ER after patient had another fall and feeling dizzy.  As per the report patient has been having frequent falls recently.  Patient states she has been feeling dizzy whenever she tries to ambulate.  She does not feel dizzy at rest or lying down.  Denies any weakness of the extremities.  She had come to the ER a day ago after a fall and at that time had sustained laceration to the right side of the forehead which had to be sutured in the ER.  Patient was discharged was discharged back to the facility.  Since patient has continued to feel dizzy and falls patient was brought to the ER again.  ED Course: CT head in the ER does not show anything acute.  EKG shows A. fib rate controlled.  Patient's labs show mildly worsening creatinine and at least 4 g drop in hemoglobin.  Stool for occult blood was negative.  COVID test is pending.  Patient admitted for further observation.  Review of Systems: As per HPI, rest all negative.   Past Medical History:  Diagnosis Date  . CAD (coronary artery disease)    a. 07/2007 PCI/Promus DES to LAD;  b. 02/2010 PCI/Promus DES to dRCA.;  c. 12/2010 Lexi MV: EF 88%, no isch/infarct.;  d. 02/2012 Cath: LM nl, LAD 20p, patent mid stent, 60d, D1 sm 70ost, D2 sm, diff, LCX sm , OM1 diff 36m (no change), RCA 40ost, patent stent, PD/PL nl, EF 75% w/o rwma's.  . Carotid bruit   . Diverticulosis   . GERD (gastroesophageal reflux disease)   . Hemorrhoids   . History of echocardiogram    a. 06/2012 Echo: EF nl, mild LVH.  Marland Kitchen HLD (hyperlipidemia)   . HTN (hypertension)   . Hypothyroidism   . IBS (irritable bowel syndrome)     Past Surgical History:  Procedure Laterality Date  .  BACK SURGERY    . CORONARY ANGIOPLASTY WITH STENT PLACEMENT    . hysterectomy - unknown type    . LEFT HEART CATHETERIZATION WITH CORONARY ANGIOGRAM N/A 02/13/2012   Procedure: LEFT HEART CATHETERIZATION WITH CORONARY ANGIOGRAM;  Surgeon: Josue Hector, MD;  Location: [facility] CATH LAB;  Service: Cardiovascular;  Laterality: N/A;  . RECTOCELE REPAIR       reports that she has never smoked. She has never used smokeless tobacco. She reports that she does not drink alcohol and does not use drugs.  No Known Allergies  Family History  Problem Relation Age of Onset  . Myasthenia gravis Mother   . Hip fracture Father   . Heart disease Other        several uncles with CAD    Prior to Admission medications   Medication Sig Start Date End Date Taking? Authorizing Provider  acetaminophen (TYLENOL) 500 MG tablet Take 1,000 mg by mouth every 8 (eight) hours as needed (for pain).   Yes [provider]  apixaban (ELIQUIS) 5 MG TABS tablet Take 1 tablet (5 mg total) by mouth 2 (two) times daily. 02/18/20  Yes Aline August, MD  atorvastatin (LIPITOR) 10 MG tablet Take 1 tablet (10 mg total) by mouth daily. 02/19/20  Yes Aline August, MD  diltiazem [place])  60 MG tablet Take 1 tablet (60 mg total) by mouth 3 (three) times daily. 05/28/20  Yes Shelly Coss, MD  furosemide (LASIX) 20 MG tablet Take 1 tablet (20 mg total) by mouth daily. 05/29/20  Yes Shelly Coss, MD  hydrOXYzine (ATARAX/VISTARIL) 25 MG tablet Take 25 mg by mouth at bedtime as needed for anxiety.  02/03/20  Yes [provider]  levothyroxine (SYNTHROID, LEVOTHROID) 100 MCG tablet Take 1 tablet by mouth daily. 12/13/15  Yes [provider]  metoprolol tartrate (LOPRESSOR) 100 MG tablet Take 100 mg by mouth 2 (two) times daily.   Yes [provider]  Multiple Vitamins-Minerals (ONE-A-DAY PROACTIVE 65+ PO) Take 1 tablet by mouth at bedtime.   Yes [provider]  MYRBETRIQ 50 MG TB24 tablet Take  50 mg by mouth every evening.  10/31/19  Yes [provider]  nitroGLYCERIN (NITROSTAT) 0.4 MG SL tablet Place 0.4 mg under the tongue every 5 (five) minutes as needed. For chest pain   Yes [provider]  trimethoprim (TRIMPEX) 100 MG tablet Take 100 mg by mouth in the morning.   Yes [provider]  vitamin B-12 (CYANOCOBALAMIN) 500 MCG tablet Take 500 mcg by mouth daily.   Yes [provider]    Physical Exam: Constitutional: Moderately built and nourished. Vitals:   08/05/20 0130 08/05/20 0145 08/05/20 0200 08/05/20 0215  BP: 126/76 133/79 140/82 136/87  Pulse: 61 (!) 45 (!) 55 61  Resp: 19 15 20 16   Temp:      TempSrc:      SpO2: 95% 96% 95% 95%  Weight:      Height:       Eyes: Anicteric no pallor. ENMT: Right-sided ecchymotic area. Neck: No mass felt.  No neck rigidity. Respiratory: No rhonchi or crepitations. Cardiovascular: S1-S2 heard. Abdomen: Soft nontender bowel sounds present. Musculoskeletal: No edema. Skin: Ecchymotic areas. Neurologic: Alert awake oriented to time place and person.  Moves all extremities. Psychiatric: Appears normal.  Normal affect.   Labs on Admission: I have personally reviewed following labs and imaging studies  CBC: Recent Labs  Lab 08/03/20 1215 08/04/20 2237  WBC 11.9* 8.2  NEUTROABS 8.1* 4.4  HGB 15.3* 11.9*  HCT 48.0* 37.8  MCV 108.1* 107.7*  PLT 202 [id number]   Basic Metabolic Panel: Recent Labs  Lab 08/03/20 1215 08/04/20 2237  NA 138 139  K 4.8 3.5  CL 99 101  CO2 27 29  GLUCOSE 100* 107*  BUN 18 30*  CREATININE 1.25* 1.41*  CALCIUM 9.3 8.4*   GFR: Estimated Creatinine Clearance: 18 mL/min (A) (by C-G formula based on SCr of 1.41 mg/dL (H)). Liver Function Tests: No results for input(s): AST, ALT, ALKPHOS, BILITOT, PROT, ALBUMIN in the last 168 hours. No results for input(s): LIPASE, AMYLASE in the last 168 hours. No results for input(s): AMMONIA in the last 168  hours. Coagulation Profile: No results for input(s): INR, PROTIME in the last 168 hours. Cardiac Enzymes: No results for input(s): CKTOTAL, CKMB, CKMBINDEX, TROPONINI in the last 168 hours. BNP (last 3 results) No results for input(s): PROBNP in the last 8760 hours. HbA1C: No results for input(s): HGBA1C in the last 72 hours. CBG: No results for input(s): GLUCAP in the last 168 hours. Lipid Profile: No results for input(s): CHOL, HDL, LDLCALC, TRIG, CHOLHDL, LDLDIRECT in the last 72 hours. Thyroid Function Tests: No results for input(s): TSH, T4TOTAL, FREET4, T3FREE, THYROIDAB in the last 72 hours. Anemia Panel: No results for input(s): VITAMINB12,  FOLATE, FERRITIN, TIBC, IRON, RETICCTPCT in the last 72 hours. Urine analysis:    Component Value Date/Time   COLORURINE AMBER (A) 05/26/2020 1710   APPEARANCEUR HAZY (A) 05/26/2020 1710   LABSPEC 1.027 05/26/2020 1710   PHURINE 5.0 05/26/2020 1710   GLUCOSEU NEGATIVE 05/26/2020 1710   HGBUR NEGATIVE 05/26/2020 1710   BILIRUBINUR NEGATIVE 05/26/2020 1710   BILIRUBINUR negative 02/10/2020 1608   KETONESUR 5 (A) 05/26/2020 1710   PROTEINUR 30 (A) 05/26/2020 1710   UROBILINOGEN 1.0 02/10/2020 1608   UROBILINOGEN 0.2 02/13/2012 0221   NITRITE NEGATIVE 05/26/2020 1710   LEUKOCYTESUR NEGATIVE 05/26/2020 1710   Sepsis Labs: @LABRCNTIP (procalcitonin:4,lacticidven:4) )No results found for this or any previous visit (from the past 240 hour(s)).   Radiological Exams on Admission: DG Sacrum/Coccyx  Result Date: 08/03/2020 CLINICAL DATA:  87 year old post fall. Fall off toilet this morning. EXAM: SACRUM AND COCCYX - 2+ VIEW COMPARISON:  Reformats from abdominopelvic CT 05/26/2020 FINDINGS: There is no evidence of fracture or other focal bone lesions. Cortical margins of the sacrum and coccyx appear intact. Bones are diffusely under mineralized. Sacroiliac joints are congruent. No sacral alar disruption. IMPRESSION: No evidence of sacral or  coccygeal fracture. Electronically Signed   By: Keith Rake M.D.   On: 08/03/2020 15:01   CT Head Wo Contrast  Result Date: 08/05/2020 CLINICAL DATA:  Dizziness.  Suspected head trauma. EXAM: CT HEAD WITHOUT CONTRAST TECHNIQUE: Contiguous axial images were obtained from the base of the skull through the vertex without intravenous contrast. COMPARISON:  MRI brain 05/27/2020.  CT head 05/26/2020 FINDINGS: Brain: No evidence of acute infarction, hemorrhage, hydrocephalus, extra-axial collection or mass lesion/mass effect. Diffuse cerebral atrophy. Ventricular dilatation likely due to central atrophy. Low-attenuation changes in the deep white matter likely due to small vessel ischemia. Vascular: Prominent intracranial arterial vascular calcifications. Skull: Calvarium appears intact. Sinuses/Orbits: Paranasal sinuses and mastoid air cells are clear. Other: None. IMPRESSION: 1. No acute intracranial abnormalities. 2. Chronic atrophy and small vessel ischemic changes. Electronically Signed   By: Lucienne Capers M.D.   On: 08/05/2020 01:03   CT Head Wo Contrast  Result Date: 08/03/2020 CLINICAL DATA:  Status post fall.  Initial encounter. EXAM: CT HEAD WITHOUT CONTRAST CT CERVICAL SPINE WITHOUT CONTRAST TECHNIQUE: Multidetector CT imaging of the head and cervical spine was performed following the standard protocol without intravenous contrast. Multiplanar CT image reconstructions of the cervical spine were also generated. COMPARISON:  None. FINDINGS: CT HEAD FINDINGS Brain: No evidence of acute infarction, hemorrhage, hydrocephalus, extra-axial collection or mass lesion/mass effect. Atrophy and chronic microvascular ischemic change noted. Vascular: No hyperdense vessel or unexpected calcification. Skull: Intact.  No focal lesion. Sinuses/Orbits: Mild mucosal thickening right sphenoid sinus. Very small mucous retention cyst or polyp left maxillary sinus. Other: None. CT CERVICAL SPINE FINDINGS Alignment: Mild  reversal of the normal cervical lordosis. Trace anterolisthesis C4 on C5 and C7 on T1 is due to facet degenerative disease. Skull base and vertebrae: No acute fracture. No primary bone lesion or focal pathologic process. Failure fusion of the posterior arch of C1 incidentally noted. Soft tissues and spinal canal: Negative. Disc levels:  Marked multilevel loss of disc space height. Upper chest: Calcified granuloma left upper lobe noted. Other: None. IMPRESSION: No acute abnormality head or cervical spine. Atrophy and chronic microvascular ischemic change. Multilevel cervical spondylosis. Mild sinus disease. Electronically Signed   By: Inge Rise M.D.   On: 08/03/2020 14:13   CT Cervical Spine Wo Contrast  Result Date: 08/03/2020 CLINICAL  DATA:  Status post fall.  Initial encounter. EXAM: CT HEAD WITHOUT CONTRAST CT CERVICAL SPINE WITHOUT CONTRAST TECHNIQUE: Multidetector CT imaging of the head and cervical spine was performed following the standard protocol without intravenous contrast. Multiplanar CT image reconstructions of the cervical spine were also generated. COMPARISON:  None. FINDINGS: CT HEAD FINDINGS Brain: No evidence of acute infarction, hemorrhage, hydrocephalus, extra-axial collection or mass lesion/mass effect. Atrophy and chronic microvascular ischemic change noted. Vascular: No hyperdense vessel or unexpected calcification. Skull: Intact.  No focal lesion. Sinuses/Orbits: Mild mucosal thickening right sphenoid sinus. Very small mucous retention cyst or polyp left maxillary sinus. Other: None. CT CERVICAL SPINE FINDINGS Alignment: Mild reversal of the normal cervical lordosis. Trace anterolisthesis C4 on C5 and C7 on T1 is due to facet degenerative disease. Skull base and vertebrae: No acute fracture. No primary bone lesion or focal pathologic process. Failure fusion of the posterior arch of C1 incidentally noted. Soft tissues and spinal canal: Negative. Disc levels:  Marked multilevel loss  of disc space height. Upper chest: Calcified granuloma left upper lobe noted. Other: None. IMPRESSION: No acute abnormality head or cervical spine. Atrophy and chronic microvascular ischemic change. Multilevel cervical spondylosis. Mild sinus disease. Electronically Signed   By: Inge Rise M.D.   On: 08/03/2020 14:13    EKG: Independently reviewed.  A. fib rate controlled.  Assessment/Plan Principal Problem:   Near syncope Active Problems:   HYPERTENSION, BENIGN   CAD, NATIVE VESSEL   HTN (hypertension)    1. Near syncope/dizziness -we will hold patient's Lasix for now.  Follow CBC since there is decline in hemoglobin could be from blood loss from recent fall and laceration.  Check orthostatics.  Physical therapy consult monitor in telemetry. 2. Anemia with at least 4 g on the hemoglobin drop.  Could be due to blood loss from recent falls.  We will check CBC closely. 3. A. fib rate controlled.  On beta-blockers and Cardizem.  Patient is on apixaban. 4. History of PE on apixaban. 5. History of diastolic CHF -holding Lasix for now.  Check orthostatics. 6. History of CAD denies any chest pain. 7. Laceration to the right side of face which was sutured on August 03, 2020 in the ER.  Sutures has to be removed by 5 days, August 08, 2020.  COVID test is pending.   DVT prophylaxis: Apixaban. Code Status: DNR. Family Communication: We will need to discuss with family. Disposition Plan: Back to facility when stable. Consults called: Physical therapy. Admission status: Observation.   Rise Patience MD [facility] Pager [phone].  If 7PM-7AM, please contact night-coverage [email or web address] Password [facility]  08/05/2020, 3:18 AM

## 2020-08-05 NOTE — TOC Initial Note (Signed)
Transition of Care [facility]) - Initial/Assessment Note    Patient Details  Name: [name] MRN: [id number] Date of Birth: 12-05-1920  Transition of Care [facility]) CM/SW Contact:    Dessa Phi, RN Phone Number: 08/05/2020, 10:37 AM  Clinical Narrative: Spoke to son [name] about d/c plans-return back to [facility];also spoke to [facility] @ [facility] confirmed level of care-can return with [facility] if needed([facility] agency preferred). Await PT recc.                  Expected Discharge Plan: [place] Barriers to Discharge: Continued Medical Work up   Patient Goals and CMS Choice Patient states their goals for this hospitalization and ongoing recovery are:: Return to [facility] CMS Medicare.gov Compare Post Acute Care list provided to:: Patient Represenative (must comment) (Budd son [phone]) Choice offered to / list presented to : Adult Children  Expected Discharge Plan and Services Expected Discharge Plan: [place]   Discharge Planning Services: CM Consult Post Acute Care Choice: [place] arrangements for the past 2 months: [place]                                      Prior Living Arrangements/Services Living arrangements for the past 2 months: [place] Lives with:: Facility Resident Patient language and need for interpreter reviewed:: Yes Do you feel safe going back to the place where you live?: Yes        Care giver support system in place?: Yes (comment) Current home services: DME (rw)    Activities of Daily Living Home Assistive Devices/Equipment: Grab bars around toilet,Grab bars in shower,Hand-held shower hose,Blood pressure cuff,Scales,Walker (specify type) (front wheeled walker) ADL Screening (condition at time of admission) Patient's cognitive ability adequate to safely complete daily activities?: Yes Is the patient deaf or have difficulty hearing?: Yes ([place]) Does the  patient have difficulty seeing, even when wearing glasses/contacts?: No Does the patient have difficulty concentrating, remembering, or making decisions?: Yes Patient able to express need for assistance with ADLs?: Yes Does the patient have difficulty dressing or bathing?: Yes Independently performs ADLs?: No Communication: Independent Dressing (OT): Needs assistance Is this a change from baseline?: Pre-admission baseline Grooming: Needs assistance Is this a change from baseline?: Pre-admission baseline Feeding: Needs assistance Is this a change from baseline?: Pre-admission baseline Bathing: Needs assistance Is this a change from baseline?: Pre-admission baseline Toileting: Needs assistance Is this a change from baseline?: Pre-admission baseline In/Out Bed: Needs assistance Is this a change from baseline?: Pre-admission baseline Walks in Home: Needs assistance Is this a change from baseline?: Pre-admission baseline Does the patient have difficulty walking or climbing stairs?: Yes (secondary to weakness and dizziness) Weakness of Legs: Both Weakness of Arms/Hands: None  Permission Sought/Granted Permission sought to share information with : Case Manager Permission granted to share information with : Yes, Verbal Permission Granted  Share Information with NAME: Case Manager     Permission granted to share info w Relationship: Budd son [phone]     Emotional Assessment Appearance:: Appears stated age Attitude/Demeanor/Rapport: Gracious Affect (typically observed): Accepting Orientation: : Oriented to Self,Oriented to Place,Oriented to Situation Alcohol / Substance Use: Not Applicable Psych Involvement: No (comment)  Admission diagnosis:  Dizziness [R42] AKI (acute kidney injury) ([place]) [N17.9] Near syncope [R55] Anemia, unspecified type [D64.9] Patient Active Problem List  Diagnosis Date Noted  . Near syncope 08/05/2020  . Rapid atrial fibrillation ([place]) 05/26/2020  .  Acute pulmonary embolism ([place]) 02/10/2020  . Chronic diastolic CHF (congestive heart failure) ([place]) 02/10/2020  . New onset atrial fibrillation ([place]) 02/10/2020  . Lung nodule 02/10/2020  . History of echocardiogram   . GERD (gastroesophageal reflux disease)   . Diverticulosis   . Carotid bruit   . IBS (irritable bowel syndrome)   . Hypothyroidism   . HTN (hypertension)   . HLD (hyperlipidemia)   . CAD (coronary artery disease)   . Atypical chest pain 06/14/2012  . Chest pain 02/12/2012  . Fatigue 04/22/2011  . CAROTID BRUIT 03/19/2009  . HYPOTHYROIDISM 08/29/2008  . HEMORRHOIDS 08/29/2008  . GERD 08/29/2008  . DIVERTICULOSIS, COLON 08/29/2008  . HYPERLIPIDEMIA 07/04/2008  . HYPERTENSION, BENIGN 07/04/2008  . CAD, NATIVE VESSEL 07/04/2008  . IRRITABLE BOWEL SYNDROME 07/04/2008   PCP:  Holland Commons, FNP Pharmacy:   [id number] [place], [address] [place] [address] Phone: [phone] Fax: [phone]  [place], [address] [place] [address] Phone: [phone] Fax: [phone]     Social Determinants of Health (SDOH) Interventions    Readmission Risk Interventions No flowsheet data found.

## 2020-08-06 ENCOUNTER — Observation Stay (HOSPITAL_BASED_OUTPATIENT_CLINIC_OR_DEPARTMENT_OTHER): Payer: PPO

## 2020-08-06 ENCOUNTER — Observation Stay (HOSPITAL_COMMUNITY): Payer: PPO

## 2020-08-06 DIAGNOSIS — H8110 Benign paroxysmal vertigo, unspecified ear: Secondary | ICD-10-CM | POA: Diagnosis not present

## 2020-08-06 DIAGNOSIS — I5022 Chronic systolic (congestive) heart failure: Secondary | ICD-10-CM | POA: Diagnosis not present

## 2020-08-06 DIAGNOSIS — K219 Gastro-esophageal reflux disease without esophagitis: Secondary | ICD-10-CM | POA: Diagnosis not present

## 2020-08-06 DIAGNOSIS — Z7901 Long term (current) use of anticoagulants: Secondary | ICD-10-CM | POA: Diagnosis not present

## 2020-08-06 DIAGNOSIS — M2550 Pain in unspecified joint: Secondary | ICD-10-CM | POA: Diagnosis not present

## 2020-08-06 DIAGNOSIS — Z955 Presence of coronary angioplasty implant and graft: Secondary | ICD-10-CM | POA: Diagnosis not present

## 2020-08-06 DIAGNOSIS — I10 Essential (primary) hypertension: Secondary | ICD-10-CM | POA: Diagnosis not present

## 2020-08-06 DIAGNOSIS — R1312 Dysphagia, oropharyngeal phase: Secondary | ICD-10-CM | POA: Diagnosis not present

## 2020-08-06 DIAGNOSIS — R32 Unspecified urinary incontinence: Secondary | ICD-10-CM | POA: Diagnosis not present

## 2020-08-06 DIAGNOSIS — K589 Irritable bowel syndrome without diarrhea: Secondary | ICD-10-CM | POA: Diagnosis not present

## 2020-08-06 DIAGNOSIS — M25531 Pain in right wrist: Secondary | ICD-10-CM | POA: Diagnosis not present

## 2020-08-06 DIAGNOSIS — Z79890 Hormone replacement therapy (postmenopausal): Secondary | ICD-10-CM | POA: Diagnosis not present

## 2020-08-06 DIAGNOSIS — R531 Weakness: Secondary | ICD-10-CM

## 2020-08-06 DIAGNOSIS — E43 Unspecified severe protein-calorie malnutrition: Secondary | ICD-10-CM | POA: Diagnosis not present

## 2020-08-06 DIAGNOSIS — W19XXXA Unspecified fall, initial encounter: Secondary | ICD-10-CM | POA: Diagnosis not present

## 2020-08-06 DIAGNOSIS — S0181XA Laceration without foreign body of other part of head, initial encounter: Secondary | ICD-10-CM | POA: Diagnosis not present

## 2020-08-06 DIAGNOSIS — Z8249 Family history of ischemic heart disease and other diseases of the circulatory system: Secondary | ICD-10-CM | POA: Diagnosis not present

## 2020-08-06 DIAGNOSIS — I2510 Atherosclerotic heart disease of native coronary artery without angina pectoris: Secondary | ICD-10-CM | POA: Diagnosis not present

## 2020-08-06 DIAGNOSIS — E785 Hyperlipidemia, unspecified: Secondary | ICD-10-CM | POA: Diagnosis not present

## 2020-08-06 DIAGNOSIS — N179 Acute kidney failure, unspecified: Secondary | ICD-10-CM

## 2020-08-06 DIAGNOSIS — R2689 Other abnormalities of gait and mobility: Secondary | ICD-10-CM | POA: Diagnosis not present

## 2020-08-06 DIAGNOSIS — R42 Dizziness and giddiness: Secondary | ICD-10-CM

## 2020-08-06 DIAGNOSIS — R41841 Cognitive communication deficit: Secondary | ICD-10-CM | POA: Diagnosis not present

## 2020-08-06 DIAGNOSIS — R2681 Unsteadiness on feet: Secondary | ICD-10-CM | POA: Diagnosis not present

## 2020-08-06 DIAGNOSIS — Z66 Do not resuscitate: Secondary | ICD-10-CM | POA: Diagnosis not present

## 2020-08-06 DIAGNOSIS — Y92129 Unspecified place in nursing home as the place of occurrence of the external cause: Secondary | ICD-10-CM | POA: Diagnosis not present

## 2020-08-06 DIAGNOSIS — R55 Syncope and collapse: Secondary | ICD-10-CM

## 2020-08-06 DIAGNOSIS — I130 Hypertensive heart and chronic kidney disease with heart failure and stage 1 through stage 4 chronic kidney disease, or unspecified chronic kidney disease: Secondary | ICD-10-CM | POA: Diagnosis not present

## 2020-08-06 DIAGNOSIS — N1832 Chronic kidney disease, stage 3b: Secondary | ICD-10-CM | POA: Diagnosis not present

## 2020-08-06 DIAGNOSIS — W1811XA Fall from or off toilet without subsequent striking against object, initial encounter: Secondary | ICD-10-CM | POA: Diagnosis present

## 2020-08-06 DIAGNOSIS — Z86711 Personal history of pulmonary embolism: Secondary | ICD-10-CM | POA: Diagnosis not present

## 2020-08-06 DIAGNOSIS — H5509 Other forms of nystagmus: Secondary | ICD-10-CM | POA: Diagnosis not present

## 2020-08-06 DIAGNOSIS — E039 Hypothyroidism, unspecified: Secondary | ICD-10-CM

## 2020-08-06 DIAGNOSIS — R498 Other voice and resonance disorders: Secondary | ICD-10-CM | POA: Diagnosis not present

## 2020-08-06 DIAGNOSIS — I4821 Permanent atrial fibrillation: Secondary | ICD-10-CM | POA: Diagnosis not present

## 2020-08-06 DIAGNOSIS — M6281 Muscle weakness (generalized): Secondary | ICD-10-CM | POA: Diagnosis not present

## 2020-08-06 DIAGNOSIS — R638 Other symptoms and signs concerning food and fluid intake: Secondary | ICD-10-CM | POA: Diagnosis not present

## 2020-08-06 DIAGNOSIS — R0902 Hypoxemia: Secondary | ICD-10-CM | POA: Diagnosis not present

## 2020-08-06 DIAGNOSIS — Z7401 Bed confinement status: Secondary | ICD-10-CM | POA: Diagnosis not present

## 2020-08-06 DIAGNOSIS — I5032 Chronic diastolic (congestive) heart failure: Secondary | ICD-10-CM | POA: Diagnosis not present

## 2020-08-06 DIAGNOSIS — I959 Hypotension, unspecified: Secondary | ICD-10-CM | POA: Diagnosis not present

## 2020-08-06 DIAGNOSIS — Z681 Body mass index (BMI) 19 or less, adult: Secondary | ICD-10-CM | POA: Diagnosis not present

## 2020-08-06 DIAGNOSIS — R7989 Other specified abnormal findings of blood chemistry: Secondary | ICD-10-CM | POA: Diagnosis not present

## 2020-08-06 DIAGNOSIS — Z20822 Contact with and (suspected) exposure to covid-19: Secondary | ICD-10-CM | POA: Diagnosis not present

## 2020-08-06 LAB — RETICULOCYTES
Immature Retic Fract: 21.3 % — ABNORMAL HIGH (ref 2.3–15.9)
RBC.: 3.98 MIL/uL (ref 3.87–5.11)
Retic Count, Absolute: 66.5 10*3/uL (ref 19.0–186.0)
Retic Ct Pct: 1.7 % (ref 0.4–3.1)

## 2020-08-06 LAB — MAGNESIUM: Magnesium: 2 mg/dL (ref 1.7–2.4)

## 2020-08-06 LAB — CBC
HCT: 44.1 % (ref 36.0–46.0)
Hemoglobin: 13.8 g/dL (ref 12.0–15.0)
MCH: 33.7 pg (ref 26.0–34.0)
MCHC: 31.3 g/dL (ref 30.0–36.0)
MCV: 107.8 fL — ABNORMAL HIGH (ref 80.0–100.0)
Platelets: 214 10*3/uL (ref 150–400)
RBC: 4.09 MIL/uL (ref 3.87–5.11)
RDW: 13.7 % (ref 11.5–15.5)
WBC: 8 10*3/uL (ref 4.0–10.5)
nRBC: 0 % (ref 0.0–0.2)

## 2020-08-06 LAB — RENAL FUNCTION PANEL
Albumin: 3.1 g/dL — ABNORMAL LOW (ref 3.5–5.0)
Anion gap: 9 (ref 5–15)
BUN: 21 mg/dL (ref 8–23)
CO2: 27 mmol/L (ref 22–32)
Calcium: 9.3 mg/dL (ref 8.9–10.3)
Chloride: 104 mmol/L (ref 98–111)
Creatinine, Ser: 1.02 mg/dL — ABNORMAL HIGH (ref 0.44–1.00)
GFR, Estimated: 50 mL/min — ABNORMAL LOW (ref >60)
Glucose, Bld: 84 mg/dL (ref 70–99)
Phosphorus: 3.2 mg/dL (ref 2.5–4.6)
Potassium: 4.8 mmol/L (ref 3.5–5.1)
Sodium: 140 mmol/L (ref 135–145)

## 2020-08-06 LAB — FOLATE: Folate: 12.1 ng/mL (ref >5.9)

## 2020-08-06 LAB — FERRITIN: Ferritin: 45 ng/mL (ref 11–307)

## 2020-08-06 LAB — VITAMIN B12: Vitamin B-12: 1054 pg/mL — ABNORMAL HIGH (ref 180–914)

## 2020-08-06 LAB — IRON AND TIBC
Iron: 61 ug/dL (ref 28–170)
Saturation Ratios: 18 % (ref 10.4–31.8)
TIBC: 338 ug/dL (ref 250–450)
UIBC: 277 ug/dL

## 2020-08-06 LAB — TSH: TSH: 27.156 u[IU]/mL — ABNORMAL HIGH (ref 0.350–4.500)

## 2020-08-06 LAB — MRSA PCR SCREENING: MRSA by PCR: NEGATIVE

## 2020-08-06 MED ORDER — MECLIZINE HCL 25 MG PO TABS
12.5000 mg | ORAL_TABLET | Freq: Two times a day (BID) | ORAL | Status: DC
  Administered 2020-08-06 – 2020-08-07 (×3): 12.5 mg via ORAL
  Filled 2020-08-06 (×3): qty 1

## 2020-08-06 NOTE — Progress Notes (Signed)
Carotid artery duplex has been completed. Preliminary results can be found in CV Proc through chart review.   08/06/20 10:36 AM [name] RVT

## 2020-08-06 NOTE — TOC Progression Note (Signed)
Transition of Care [facility]) - Progression Note    Patient Details  Name: AINSLEE SOU MRN: [id number] Date of Birth: 02/27/1921  Transition of Care [facility]) CM/SW Contact  Akhila Mahnken, Juliann Pulse, RN Phone Number: 08/06/2020, 2:45 PM  Clinical Narrative:Spoke to son [name]-d/c plan return back to ALF @ [facility] w/HHPT/OT.Informed Budd of PT recc-SNF/HPT/24hr asst-[name] says d/c plan return back to [facility]- CM has left vm w/[facility] rep-Kaylee for call back-fl2 snet via e-fax-fl2 (pended for ALF) await call back from [facility] if can accept.       Expected Discharge Plan: [place] Barriers to Discharge: Continued Medical Work up  Expected Discharge Plan and Services Expected Discharge Plan: [place]   Discharge Planning Services: CM Consult Post Acute Care Choice: [place] arrangements for the past 2 months: [place] Expected Discharge Date: 08/06/20                                     Social Determinants of Health (SDOH) Interventions    Readmission Risk Interventions No flowsheet data found.

## 2020-08-06 NOTE — Progress Notes (Signed)
PROGRESS NOTE  [name] HDQ:[id number] DOB: 04/15/1920   PCP: Fatima Sanger, FNP  Patient is from: ALF  DOA: 08/04/2020 LOS: 0  Chief complaints: Dizziness, fall  Brief Narrative / Interim history: 87 year old F with PMH of diastolic CHF, A. fib on Eliquis, CAD, PE and hypothyroidism return to ED with dizziness.  Patient was seen in ED due to forehead laceration after fall at ALF the day prior to admission and discharged back to ALF but continued to have dizziness and brought back to ED.  In ED, CTH without acute finding.  EKG with rate controlled A. Fib.  Hgb 11 (baseline~14).  Fecal Hemoccult negative.  She was admitted with working diagnosis of near syncope for observation.  Patient continues to have significant vertigo with minimal movement of the head concerning for BPPV and possibly concussion.  Carotid Dopplers without significant finding.  MRI brain ordered.  Subjective: Seen and examined earlier this morning.  She reports headache and dizziness.  She is afraid of opening her eyes due to dizziness.  She describes the dizziness as spinning.  She denies chest pain, dyspnea, GI or UTI symptoms.  Objective: Vitals:   08/05/20 0939 08/05/20 1459 08/05/20 2046 08/06/20 0500  BP: 121/76 112/69 127/77 115/76  Pulse: 98 89 83   Resp:  18    Temp:  97.7 F (36.5 C) 97.9 F (36.6 C) 98 F (36.7 C)  TempSrc:  Oral Oral Oral  SpO2:  (!) 86% 99% 96%  Weight:      Height:        Intake/Output Summary (Last 24 hours) at 08/06/2020 1149 Last data filed at 08/06/2020 1194 Gross per 24 hour  Intake 720 ml  Output --  Net 720 ml   Filed Weights   08/04/20 2209  Weight: 49.9 kg    Examination:  GENERAL: Frail looking elderly female. HEENT: MMM.  Laceration over right frontal head with sutures.  Bruising/ecchymosis about right eyes/face. NECK: Supple.  No apparent JVD.  RESP: On RA.  No IWOB.  Fair aeration bilaterally. CVS:  RRR. Heart sounds normal.  ABD/GI/GU:  BS+. Abd soft, NTND.  MSK/EXT:  Moves extremities. No apparent deformity. No edema.  SKIN: no apparent skin lesion or wound NEURO: Awake, alert and oriented appropriately.  No focal neuro symptoms but vertigo with position change. PSYCH: Calm. Normal affect.  Procedures:  None  Microbiology summarized: COVID-19 PCR nonreactive.  Assessment & Plan: Dizziness/vertigo-likely due to BPPV and possible concussion from fall.  She has right beating nystagmus on exam but no other focal neurodeficit.  Orthostatic vitals negative.  No significant finding on EKG. CT head without contrast, CT cervical spine and carotid Doppler without significant finding. -Appreciate help by vestibular PT-recommended SNF versus HH PT with 24-hour supervision -Trial of low-dose meclizine -Check MRI brain without contrast  Fall at ALF likely due to vertigo. Skin laceration over right forehead -Fall precaution -Continue vestibular PT -Five interrupted sutures on 4/25 in ED. needs suture removal on 4/30  AKI on CKD-3B/azotemia: Resolving. Recent Labs    05/26/20 1456 05/26/20 1506 05/27/20 0054 05/28/20 0039 05/30/20 0041 05/31/20 0818 08/03/20 1215 08/04/20 2237 08/05/20 0625 08/06/20 0529  BUN 11 13 9  7* 10 8 18  30* 28* 21  CREATININE 0.94 0.90 0.76 0.89 1.08* 1.06* 1.25* 1.41* 1.33* 1.02*  -Continue holding nephrotoxic meds  Permanent A. Fib: Rate controlled.  CHA2DS2-VASc score 6. -Continue home metoprolol and Cardizem -Low-dose Eliquis  History of PE-diagnosed in 02/2020.  Unclear if it  is provoked or unprovoked. -Low-dose Eliquis as above  Chronic diastolic CHF: TTE in [id number] with LVEF of 60 to 65% and RVSP of 54.5 mmHg.  On p.o. Lasix 20 mg daily at home.  Appears euvolemic. -Continue holding Lasix -Monitor respiratory and fluid status  History of CAD s/p DES to LAD and dRCA no chest pain.  No acute ischemic finding on EKG. -Continue metoprolol tartrate and statin  Hypothyroidism: TSH  elevated to 26.4 (from 0.5 about 2 months ago).  Maybe not getting her Synthroid -Recheck TSH -Continue home Synthroid  Urinary incontinence/recurrent UTI -Continue home trimethoprim and Myrbetriq  Underweight Body mass index is 18.3 kg/m.  -Consult dietitian       DVT prophylaxis:  apixaban (ELIQUIS) tablet 2.5 mg Start: 08/05/20 2200 apixaban (ELIQUIS) tablet 2.5 mg  Code Status: DNR/DNI Family Communication: Attempted to call patient's son over the phone. Level of care: Telemetry Status is: Observation  The patient will require care spanning > 2 midnights and should be moved to inpatient because: Ongoing diagnostic testing needed not appropriate for outpatient work up and Unsafe d/c plan  Dispo: The patient is from: ALF              Anticipated d/c is to: To be determined based on clinical progress              Patient currently is not medically stable to d/c.   Difficult to place patient No       Consultants:  None   Sch Meds:  Scheduled Meds: . apixaban  2.5 mg Oral BID  . atorvastatin  10 mg Oral QHS  . diltiazem  60 mg Oral TID  . levothyroxine  100 mcg Oral Daily  . metoprolol tartrate  100 mg Oral BID  . mirabegron ER  50 mg Oral QHS  . trimethoprim  100 mg Oral Daily  . vitamin B-12  500 mcg Oral Daily   Continuous Infusions: PRN Meds:.acetaminophen **OR** acetaminophen, hydrOXYzine, nitroGLYCERIN  Antimicrobials: Anti-infectives (From admission, onward)   Start     Dose/Rate Route Frequency Ordered Stop   08/05/20 1000  trimethoprim (TRIMPEX) tablet 100 mg        100 mg Oral Daily 08/05/20 0317         I have personally reviewed the following labs and images: CBC: Recent Labs  Lab 08/03/20 1215 08/04/20 2237 08/05/20 0625 08/06/20 0529  WBC 11.9* 8.2 8.4 8.0  NEUTROABS 8.1* 4.4  --   --   HGB 15.3* 11.9* 14.5 13.8  HCT 48.0* 37.8 44.8 44.1  MCV 108.1* 107.7* 106.9* 107.8*  PLT 202 173 210 214   BMP &GFR Recent Labs  Lab  08/03/20 1215 08/04/20 2237 08/05/20 0625 08/06/20 0529  NA 138 139 139 140  K 4.8 3.5 4.0 4.8  CL 99 101 100 104  CO2 27 29 28 27   GLUCOSE 100* 107* 103* 84  BUN 18 30* 28* 21  CREATININE 1.25* 1.41* 1.33* 1.02*  CALCIUM 9.3 8.4* 9.2 9.3  MG  --   --   --  2.0  PHOS  --   --   --  3.2   Estimated Creatinine Clearance: 24.8 mL/min (A) (by C-G formula based on SCr of 1.02 mg/dL (H)). Liver & Pancreas: Recent Labs  Lab 08/06/20 0529  ALBUMIN 3.1*   No results for input(s): LIPASE, AMYLASE in the last 168 hours. No results for input(s): AMMONIA in the last 168 hours. Diabetic: No results for input(s): HGBA1C in  the last 72 hours. No results for input(s): GLUCAP in the last 168 hours. Cardiac Enzymes: No results for input(s): CKTOTAL, CKMB, CKMBINDEX, TROPONINI in the last 168 hours. No results for input(s): PROBNP in the last 8760 hours. Coagulation Profile: No results for input(s): INR, PROTIME in the last 168 hours. Thyroid Function Tests: Recent Labs    08/05/20 0625  TSH 26.400*   Lipid Profile: No results for input(s): CHOL, HDL, LDLCALC, TRIG, CHOLHDL, LDLDIRECT in the last 72 hours. Anemia Panel: Recent Labs    08/06/20 0529  VITAMINB12 1,054*  FOLATE 12.1  FERRITIN 45  TIBC 338  IRON 61  RETICCTPCT 1.7   Urine analysis:    Component Value Date/Time   COLORURINE AMBER (A) 05/26/2020 1710   APPEARANCEUR HAZY (A) 05/26/2020 1710   LABSPEC 1.027 05/26/2020 1710   PHURINE 5.0 05/26/2020 1710   GLUCOSEU NEGATIVE 05/26/2020 1710   HGBUR NEGATIVE 05/26/2020 1710   BILIRUBINUR NEGATIVE 05/26/2020 1710   BILIRUBINUR negative 02/10/2020 1608   KETONESUR 5 (A) 05/26/2020 1710   PROTEINUR 30 (A) 05/26/2020 1710   UROBILINOGEN 1.0 02/10/2020 1608   UROBILINOGEN 0.2 02/13/2012 0221   NITRITE NEGATIVE 05/26/2020 1710   LEUKOCYTESUR NEGATIVE 05/26/2020 1710   Sepsis Labs: Invalid input(s): PROCALCITONIN, [place]  Microbiology: Recent Results (from the  past 240 hour(s))  SARS CORONAVIRUS 2 (TAT 6-24 HRS) Nasopharyngeal Nasopharyngeal Swab     Status: None   Collection Time: 08/05/20  1:11 AM   Specimen: Nasopharyngeal Swab  Result Value Ref Range Status   SARS Coronavirus 2 NEGATIVE NEGATIVE Final    Comment: (NOTE) SARS-CoV-2 target nucleic acids are NOT DETECTED.  The SARS-CoV-2 RNA is generally detectable in upper and lower respiratory specimens during the acute phase of infection. Negative results do not preclude SARS-CoV-2 infection, do not rule out co-infections with other pathogens, and should not be used as the sole basis for treatment or other patient management decisions. Negative results must be combined with clinical observations, patient history, and epidemiological information. The expected result is Negative.  Fact Sheet for Patients: SugarRoll.be  Fact Sheet for Healthcare Providers: https://www.woods-mathews.com/  This test is not yet approved or cleared by the [place] FDA and  has been authorized for detection and/or diagnosis of SARS-CoV-2 by FDA under an Emergency Use Authorization (EUA). This EUA will remain  in effect (meaning this test can be used) for the duration of the COVID-19 declaration under Se ction 564(b)(1) of the Act, 21 U.S.C. section 360bbb-3(b)(1), unless the authorization is terminated or revoked sooner.  Performed at [facility], [place] [address] [place], [place] [address]     Radiology Studies: VAS US CAROTID  Result Date: 08/06/2020 Carotid Arterial Duplex Study Patient Name:  [name] [facility]  Date of Exam:   08/06/2020 Medical Rec #: [id number]       Accession #:    [id number] Date of Birth: 1921/03/16       Patient Gender: F Patient Age:   89Y Exam Location:  [facility] Procedure:      VAS US CAROTID Referring Phys: [id number] Charlesetta Ivory Rateel Beldin --------------------------------------------------------------------------------   Indications:       Syncope. Risk Factors:      Hypertension, hyperlipidemia. Comparison Study:  No prior studies. Performing Technologist: Oliver Hum RVT  Examination Guidelines: A complete evaluation includes B-mode imaging, spectral Doppler, color Doppler, and power Doppler as needed of all accessible portions of each vessel. Bilateral testing is considered an integral part of a complete  examination. Limited examinations for reoccurring indications may be performed as noted.  Right Carotid Findings: +----------+--------+--------+--------+-----------------------+--------+           PSV cm/sEDV cm/sStenosisPlaque Description     Comments +----------+--------+--------+--------+-----------------------+--------+ CCA Prox  50      12              smooth and heterogenoustortuous +----------+--------+--------+--------+-----------------------+--------+ CCA Distal62      15              smooth and heterogenous         +----------+--------+--------+--------+-----------------------+--------+ ICA Prox  71      17              calcific                        +----------+--------+--------+--------+-----------------------+--------+ ICA Distal72      27                                     tortuous +----------+--------+--------+--------+-----------------------+--------+ ECA       67      6                                               +----------+--------+--------+--------+-----------------------+--------+ +----------+--------+-------+--------+-------------------+           PSV cm/sEDV cmsDescribeArm Pressure (mmHG) +----------+--------+-------+--------+-------------------+ [id number]                                         +----------+--------+-------+--------+-------------------+ +---------+--------+--+--------+-+---------+ VertebralPSV cm/s36EDV cm/s8Antegrade +---------+--------+--+--------+-+---------+  Left Carotid Findings:  +----------+--------+--------+--------+-----------------------+--------+           PSV cm/sEDV cm/sStenosisPlaque Description     Comments +----------+--------+--------+--------+-----------------------+--------+ CCA Prox  54      10              smooth and heterogenous         +----------+--------+--------+--------+-----------------------+--------+ CCA Distal49      6               smooth and heterogenous         +----------+--------+--------+--------+-----------------------+--------+ ICA Prox  60      13              calcific                        +----------+--------+--------+--------+-----------------------+--------+ ICA Distal102     22                                     tortuous +----------+--------+--------+--------+-----------------------+--------+ ECA       79      7                                               +----------+--------+--------+--------+-----------------------+--------+ +----------+--------+--------+--------+-------------------+           PSV cm/sEDV cm/sDescribeArm Pressure (mmHG) +----------+--------+--------+--------+-------------------+ Subclavian70                                          +----------+--------+--------+--------+-------------------+ +---------+--------+--+--------+-+---------+  VertebralPSV cm/s22EDV cm/s5Antegrade +---------+--------+--+--------+-+---------+   Summary: Right Carotid: Velocities in the right ICA are consistent with a 1-39% stenosis. Left Carotid: Velocities in the left ICA are consistent with a 1-39% stenosis. Vertebrals: Bilateral vertebral arteries demonstrate antegrade flow. *See table(s) above for measurements and observations.     Preliminary      Clytie Shetley T. Deering  If 7PM-7AM, please contact night-coverage [email or web address] 08/06/2020, 11:49 AM

## 2020-08-06 NOTE — NC FL2 (Incomplete)
[place] LEVEL OF CARE SCREENING TOOL     IDENTIFICATION  Patient Name: [name] Birthdate: 05-04-1920 Sex: female Admission Date (Current Location): 08/04/2020  [facility] and [place] Number:  Herbalist and Address:  [facility],  [place] [address] [place]      Provider Number: [id number]  Attending Physician Name and Address:  Mercy Riding, MD  Relative Name and Phone Number:  Jenise Iannelli son [phone]    Current Level of Care: Hospital Recommended Level of Care: ALF Prior Approval Number:    Date Approved/Denied:   PASRR Number:    Discharge Plan: SNF    Current Diagnoses: Patient Active Problem List   Diagnosis Date Noted  . Near syncope 08/05/2020  . Rapid atrial fibrillation ([place]) 05/26/2020  . Acute pulmonary embolism ([place]) 02/10/2020  . Chronic diastolic CHF (congestive heart failure) ([place]) 02/10/2020  . New onset atrial fibrillation ([place]) 02/10/2020  . Lung nodule 02/10/2020  . History of echocardiogram   . GERD (gastroesophageal reflux disease)   . Diverticulosis   . Carotid bruit   . IBS (irritable bowel syndrome)   . Hypothyroidism   . HTN (hypertension)   . HLD (hyperlipidemia)   . CAD (coronary artery disease)   . Atypical chest pain 06/14/2012  . Chest pain 02/12/2012  . Fatigue 04/22/2011  . CAROTID BRUIT 03/19/2009  . HYPOTHYROIDISM 08/29/2008  . HEMORRHOIDS 08/29/2008  . GERD 08/29/2008  . DIVERTICULOSIS, COLON 08/29/2008  . HYPERLIPIDEMIA 07/04/2008  . HYPERTENSION, BENIGN 07/04/2008  . CAD, NATIVE VESSEL 07/04/2008  . IRRITABLE BOWEL SYNDROME 07/04/2008    Orientation RESPIRATION BLADDER Height & Weight     Self,Time,Situation,Place  Normal Continent Weight: 49.9 kg Height:  5\' 5"  (165.1 cm)  BEHAVIORAL SYMPTOMS/MOOD NEUROLOGICAL BOWEL NUTRITION STATUS      Continent Diet (Heart Healthy)  AMBULATORY STATUS COMMUNICATION OF NEEDS Skin   Limited Assist Verbally Normal                        Personal Care Assistance Level of Assistance  Bathing,Feeding,Dressing Bathing Assistance: Limited assistance Feeding assistance: Limited assistance Dressing Assistance: Limited assistance     Functional Limitations Info  Sight,Speech,Hearing Sight Info: Adequate Hearing Info: Adequate Speech Info: Impaired (dentures-uppers/lowers)    SPECIAL CARE FACTORS FREQUENCY  PT (By licensed PT),OT (By licensed OT)     PT Frequency:  (5x week) OT Frequency:  (5x week)            Contractures Contractures Info: Not present    Additional Factors Info  Code Status,Allergies Code Status Info:  (DNR) Allergies Info:  (NKA)           Current Medications (08/06/2020):  This is the current hospital active medication list Current Facility-Administered Medications  Medication Dose Route Frequency Provider Last Rate Last Admin  . acetaminophen (TYLENOL) tablet 650 mg  650 mg Oral Q6H PRN Rise Patience, MD   650 mg at 08/06/20 0350   Or  . acetaminophen (TYLENOL) suppository 650 mg  650 mg Rectal Q6H PRN Rise Patience, MD      . apixaban Arne Cleveland) tablet 2.5 mg  2.5 mg Oral BID Wendee Beavers T, MD   2.5 mg at 08/06/20 0937  . atorvastatin (LIPITOR) tablet 10 mg  10 mg Oral QHS Rise Patience, MD   10 mg at 08/05/20 2100  . diltiazem (CARDIZEM) tablet 60 mg  60 mg Oral TID  Rise Patience, MD   60 mg at 08/06/20 9678  . hydrOXYzine (ATARAX/VISTARIL) tablet 25 mg  25 mg Oral QHS PRN Rise Patience, MD      . levothyroxine (SYNTHROID) tablet 100 mcg  100 mcg Oral Daily Rise Patience, MD   100 mcg at 08/06/20 [phone]  . meclizine (ANTIVERT) tablet 12.5 mg  12.5 mg Oral BID Wendee Beavers T, MD   12.5 mg at 08/06/20 1209  . metoprolol tartrate (LOPRESSOR) tablet 100 mg  100 mg Oral BID Rise Patience, MD   100 mg at 08/06/20 0175  . mirabegron ER (MYRBETRIQ) tablet 50 mg  50 mg Oral QHS Rise Patience, MD   50 mg at 08/05/20 2100  .  nitroGLYCERIN (NITROSTAT) SL tablet 0.4 mg  0.4 mg Sublingual Q5 min PRN Rise Patience, MD      . trimethoprim (TRIMPEX) tablet 100 mg  100 mg Oral Daily Rise Patience, MD   100 mg at 08/06/20 1025  . vitamin B-12 (CYANOCOBALAMIN) tablet 500 mcg  500 mcg Oral Daily Rise Patience, MD   500 mcg at 08/06/20 0945     Discharge Medications: Please see discharge summary for a list of discharge medications.  Relevant Imaging Results:  Relevant Lab Results:   Additional Information SS#[id number] [phone]  [place]eg[place]ann [place], Juliann Pulse, RN

## 2020-08-07 ENCOUNTER — Inpatient Hospital Stay (HOSPITAL_COMMUNITY): Payer: PPO

## 2020-08-07 LAB — URINALYSIS, ROUTINE W REFLEX MICROSCOPIC
Bilirubin Urine: NEGATIVE
Glucose, UA: NEGATIVE mg/dL
Hgb urine dipstick: NEGATIVE
Ketones, ur: NEGATIVE mg/dL
Leukocytes,Ua: NEGATIVE
Nitrite: NEGATIVE
Protein, ur: NEGATIVE mg/dL
Specific Gravity, Urine: 1.02 (ref 1.005–1.030)
pH: 5 (ref 5.0–8.0)

## 2020-08-07 NOTE — Progress Notes (Addendum)
Occupational Therapy Treatment Patient Details Name: [name] MRN: [id number] DOB: 09/07/1920 Today's Date: 08/07/2020    History of present illness 87 yo female admitted with syncope, fall, dizziness, R head laceration. Hx of A fib, CAD, CHF, pleural effusions   OT comments  Patient reports increased weakness, continued pain in sacral area and new complaints of right wrist pain. Patient exhibiting difficulty using right hand for transfers or holding items. Patient dizzy with all movement but no nystagmus noted. Patient overall min assist today with transfers and ADL task. Due to patient's continued decline recommend short term rehab at discharge as she is unable to independently able to perform ADLs and ambulation.   Follow Up Recommendations  SNF    Equipment Recommendations  None recommended by OT    Recommendations for Other Services      Precautions / Restrictions Precautions Precautions: Fall Precaution Comments: dizziness Restrictions Weight Bearing Restrictions: No       Mobility Bed Mobility Overal bed mobility: Needs Assistance Bed Mobility: Rolling;Sidelying to Sit;Sit to Sidelying Rolling: Modified independent (Device/Increase time) Sidelying to sit: Min assist Supine to sit: Min assist;HOB elevated Sit to supine: Min assist   General bed mobility comments: assist for trunk due to pain in right wrist (has brace at this time) and buttocks    Transfers Overall transfer level: Needs assistance Equipment used: Rolling walker (2 wheeled) Transfers: Sit to/from Stand Sit to Stand: Min assist         General transfer comment: pt declined mobility after positional testing for BPPV    Balance Overall balance assessment: Needs assistance Sitting-balance support: No upper extremity supported Sitting balance-Leahy Scale: Fair Sitting balance - Comments: close min guard due to dizziness   Standing balance support: During functional activity;Bilateral  upper extremity supported Standing balance-Leahy Scale: Poor Standing balance comment: limited by persistent dizziness                           ADL either performed or assessed with clinical judgement   ADL Overall ADL's : Needs assistance/impaired     Grooming: Set up;Bed level Grooming Details (indicate cue type and reason): wipes eyes in bed. Right eye swollen from bruising                 Toilet Transfer: Minimal assistance;Regular Toilet;Grab bars;RW   Toileting- Clothing Manipulation and Hygiene: Minimal assistance;Sit to/from stand Toileting - Clothing Manipulation Details (indicate cue type and reason): min assist to help with managing clothing - patient reports pain in right wrist and difficulty using right hand             Vision Patient Visual Report: No change from baseline Additional Comments: R eye swoleen   Perception     Praxis      Cognition Arousal/Alertness: Awake/alert Behavior During Therapy: WFL for tasks assessed/performed Overall Cognitive Status: Within Functional Limits for tasks assessed                                          Exercises     Shoulder Instructions       General Comments      Pertinent Vitals/ Pain       Pain Assessment: Faces Faces Pain Scale: Hurts even more Pain Location: buttocks, right wrist Pain Descriptors / Indicators: Discomfort;Grimacing;Sore;Guarding Pain Intervention(s): Repositioned;Monitored during session  Home Living  Prior Functioning/Environment              Frequency  Min 2X/week        Progress Toward Goals  OT Goals(current goals can now be found in the care plan section)  Progress towards OT goals: Not progressing toward goals - comment (increase in weakness, persistent dizziness)  Acute Rehab OT Goals Patient Stated Goal: move without getting dizzy OT Goal Formulation: With  patient Time For Goal Achievement: 08/19/20 Potential to Achieve Goals: [place] Discharge plan needs to be updated    Co-evaluation                 AM-PAC OT "6 Clicks" Daily Activity     Outcome Measure   Help from another person eating meals?: A Little Help from another person taking care of personal grooming?: A Little Help from another person toileting, which includes using toliet, bedpan, or urinal?: A Little Help from another person bathing (including washing, rinsing, drying)?: A Little Help from another person to put on and taking off regular upper body clothing?: A Little Help from another person to put on and taking off regular lower body clothing?: A Little 6 Click Score: 18    End of Session Equipment Utilized During Treatment: Gait belt;Rolling walker  OT Visit Diagnosis: Unsteadiness on feet (R26.81);BPPV;Dizziness and giddiness (R42);Muscle weakness (generalized) (M62.81);Pain   Activity Tolerance Patient limited by fatigue   Patient Left in chair;with call bell/phone within reach;with chair alarm set   Nurse Communication Mobility status (R wrist pain. MD in room and also informed)        Time: 8270-7867 OT Time Calculation (min): 25 min  Charges: OT General Charges $OT Visit: 1 Visit OT Treatments $Self Care/Home Management : 23-37 mins  Glyn Gerads, OTR/L [place]  Office [phone] Pager: [place] 08/07/2020, 1:00 PM

## 2020-08-07 NOTE — TOC Progression Note (Signed)
Transition of Care [facility]) - Progression Note    Patient Details  Name: [name] MRN: [id number] Date of Birth: 30-Jul-1920  [facility] CM/SW Contact  [name], Marjie Skiff, RN Phone Number: 08/07/2020, 1:03 PM  Clinical Narrative:    Per MD disposition has now been changed to SNF. Per PT note it has been noted that pt is not back to mobility baseline from when she was in ALF. FL2 faxed out to area facilities and SNF auth started with [facility]. Auth started with [facility] for PTAR transport to SNF as well.   Social Determinants of Health (SDOH) Interventions    Readmission Risk Interventions No flowsheet data found.

## 2020-08-07 NOTE — Progress Notes (Signed)
Orthopedic Tech Progress Note Patient Details:  [name] 1921/01/18 [id number]  Ortho Devices Type of Ortho Device: Velcro wrist splint Ortho Device/Splint Location: Right Wrist Ortho Device/Splint Interventions: Application   Post Interventions Patient Tolerated: Well   Linus Salmons Corrinna Karapetyan 08/07/2020, 10:29 AM

## 2020-08-07 NOTE — Progress Notes (Signed)
PROGRESS NOTE  [name] DJS:[id number] DOB: 1920/05/03   PCP: Holland Commons, FNP  Patient is from: ALF  DOA: 08/04/2020 LOS: 1  Chief complaints: Dizziness, fall  Brief Narrative / Interim history: 87 year old F with PMH of diastolic CHF, A. fib on Eliquis, CAD, PE and hypothyroidism return to ED with dizziness.  Patient was seen in ED due to forehead laceration after fall at ALF the day prior to admission and discharged back to ALF but continued to have dizziness and brought back to ED.  In ED, CTH without acute finding.  EKG with rate controlled A. Fib.  Hgb 11 (baseline~14).  Fecal Hemoccult negative.  She was admitted with working diagnosis of near syncope for observation.  Patient continues to have significant vertigo with minimal movement of the head concerning for BPPV and possibly concussion.  Carotid Dopplers without significant finding.  MRI brain without acute finding.  Patient had positive right Dix-Hallpike with torsional nystagmus lasting about 10 to 15 seconds suggesting BPPV.  Therapy recommended SNF.  TOC consulted for SNF placement.  Subjective: Seen and examined earlier this morning.  She reports headache that she describes as pressure over the back of her head on the right side.  She also reports dizziness that she describes as spinning mainly with movement.  She complains right wrist pain.  Pain is worse with movement.  Denies numbness or tingling.  Denies chest pain or dyspnea.  Objective: Vitals:   08/06/20 1736 08/06/20 2114 08/07/20 0548 08/07/20 0932  BP:  109/65 (!) 127/93 125/88  Pulse: 99 75 64 62  Resp:   20   Temp:  97.7 F (36.5 C) 98.2 F (36.8 C)   TempSrc:  Oral Oral   SpO2:  100% 98%   Weight:      Height:        Intake/Output Summary (Last 24 hours) at 08/07/2020 1226 Last data filed at 08/07/2020 1000 Gross per 24 hour  Intake 720 ml  Output --  Net 720 ml   Filed Weights   08/04/20 2209  Weight: 49.9 kg     Examination:  GENERAL: No apparent distress.  Nontoxic. HEENT: MMM.  Laceration over right frontal head with sutures.  Bruising and ecchymosis above right eye NECK: Supple.  No apparent JVD.  RESP:  No IWOB.  Fair aeration bilaterally. CVS:  RRR. Heart sounds normal.  ABD/GI/GU: BS+. Abd soft, NTND.  MSK/EXT:  Moves extremities. No apparent deformity. No edema.  SKIN: no apparent skin lesion or wound NEURO: Awake, alert and oriented appropriately.  PERRL.  Strength symmetric.  Finger-to-nose intact.  Reflexes symmetric.  She was not able to follow my finger on EOM exam PSYCH: Calm. Normal affect.  Procedures:  None  Microbiology summarized: COVID-19 PCR nonreactive.  Assessment & Plan: Dizziness/vertigo-likely due to BPPV and possible concussion from fall.  She has right beating nystagmus on exam but no other focal neurodeficit.  Right Dix-Hallpike positive with torsional nystagmus lasting 10 to 15 seconds with a spinning sensation.  Orthostatic vitals negative.  No significant finding on EKG. CT head without contrast, CT cervical spine, carotid Doppler and MRI brain without acute or significant finding.  TSH elevated to 27.  -Continue vestibular PT -Therapy recommended SNF. -Fall precautions.  Fall at ALF likely due to vertigo. Skin laceration over right forehead -Fall precautions -Continue vestibular PT -Five interrupted sutures on 4/25 in ED. needs suture removal on 4/30  AKI on CKD-3B/azotemia: Resolving. Recent Labs    05/26/20 1456  05/26/20 1506 05/27/20 0054 05/28/20 0039 05/30/20 0041 05/31/20 0818 08/03/20 1215 08/04/20 2237 08/05/20 0625 08/06/20 0529  BUN 11 13 9  7* 10 8 18  30* 28* 21  CREATININE 0.94 0.90 0.76 0.89 1.08* 1.06* 1.25* 1.41* 1.33* 1.02*  -Continue holding nephrotoxic meds  Permanent A. Fib: Rate controlled.  CHA2DS2-VASc score 6. -Continue home metoprolol and Cardizem -Low-dose Eliquis  History of PE-diagnosed in 02/2020.  Unclear if  it is provoked or unprovoked. -Low-dose Eliquis-  Chronic diastolic CHF: TTE in 96/2952 with LVEF of 60 to 65% and RVSP of 54.5 mmHg.  On p.o. Lasix 20 mg daily at home.  Appears euvolemic. -Continue holding Lasix -Monitor respiratory and fluid status  History of CAD s/p DES to LAD and dRCA no chest pain.  No acute ischemic finding on EKG. -Continue metoprolol tartrate and statin  Hypothyroidism: TSH elevated to 26.4.  27 on recheck.  (0.5 about 2 months ago).  Maybe not be getting her Synthroid at ALF. -Continue home Synthroid -Recheck TSH in 3 to 4 weeks  Urinary incontinence/recurrent UTI -Continue home trimethoprim and Myrbetriq  Underweight Body mass index is 18.3 kg/m.  -Consult dietitian       DVT prophylaxis:  apixaban (ELIQUIS) tablet 2.5 mg Start: 08/05/20 2200 apixaban (ELIQUIS) tablet 2.5 mg  Code Status: DNR/DNI Family Communication: Updated patient's son at bedside. Level of care: Telemetry Status is: Inpatient  Remains inpatient appropriate because:Unsafe d/c plan   Dispo: The patient is from: ALF              Anticipated d/c is to: SNF              Patient currently is medically stable to d/c.   Difficult to place patient No            Consultants:  None   Sch Meds:  Scheduled Meds: . apixaban  2.5 mg Oral BID  . atorvastatin  10 mg Oral QHS  . diltiazem  60 mg Oral TID  . levothyroxine  100 mcg Oral Daily  . metoprolol tartrate  100 mg Oral BID  . mirabegron ER  50 mg Oral QHS  . trimethoprim  100 mg Oral Daily  . vitamin B-12  500 mcg Oral Daily   Continuous Infusions: PRN Meds:.acetaminophen **OR** acetaminophen, hydrOXYzine, nitroGLYCERIN  Antimicrobials: Anti-infectives (From admission, onward)   Start     Dose/Rate Route Frequency Ordered Stop   08/05/20 1000  trimethoprim (TRIMPEX) tablet 100 mg        100 mg Oral Daily 08/05/20 0317         I have personally reviewed the following labs and images: CBC: Recent Labs   Lab 08/03/20 1215 08/04/20 2237 08/05/20 0625 08/06/20 0529  WBC 11.9* 8.2 8.4 8.0  NEUTROABS 8.1* 4.4  --   --   HGB 15.3* 11.9* 14.5 13.8  HCT 48.0* 37.8 44.8 44.1  MCV 108.1* 107.7* 106.9* 107.8*  PLT 202 173 210 214   BMP &GFR Recent Labs  Lab 08/03/20 1215 08/04/20 2237 08/05/20 0625 08/06/20 0529  NA 138 139 139 140  K 4.8 3.5 4.0 4.8  CL 99 101 100 104  CO2 27 29 28 27   GLUCOSE 100* 107* 103* 84  BUN 18 30* 28* 21  CREATININE 1.25* 1.41* 1.33* 1.02*  CALCIUM 9.3 8.4* 9.2 9.3  MG  --   --   --  2.0  PHOS  --   --   --  3.2   Estimated Creatinine  Clearance: 24.8 mL/min (A) (by C-G formula based on SCr of 1.02 mg/dL (H)). Liver & Pancreas: Recent Labs  Lab 08/06/20 0529  ALBUMIN 3.1*   No results for input(s): LIPASE, AMYLASE in the last 168 hours. No results for input(s): AMMONIA in the last 168 hours. Diabetic: No results for input(s): HGBA1C in the last 72 hours. No results for input(s): GLUCAP in the last 168 hours. Cardiac Enzymes: No results for input(s): CKTOTAL, CKMB, CKMBINDEX, TROPONINI in the last 168 hours. No results for input(s): PROBNP in the last 8760 hours. Coagulation Profile: No results for input(s): INR, PROTIME in the last 168 hours. Thyroid Function Tests: Recent Labs    08/06/20 0529  TSH 27.156*   Lipid Profile: No results for input(s): CHOL, HDL, LDLCALC, TRIG, CHOLHDL, LDLDIRECT in the last 72 hours. Anemia Panel: Recent Labs    08/06/20 0529  VITAMINB12 1,054*  FOLATE 12.1  FERRITIN 45  TIBC 338  IRON 61  RETICCTPCT 1.7   Urine analysis:    Component Value Date/Time   COLORURINE YELLOW 08/07/2020 0614   APPEARANCEUR CLEAR 08/07/2020 0614   LABSPEC 1.020 08/07/2020 0614   PHURINE 5.0 08/07/2020 0614   GLUCOSEU NEGATIVE 08/07/2020 0614   HGBUR NEGATIVE 08/07/2020 0614   BILIRUBINUR NEGATIVE 08/07/2020 0614   BILIRUBINUR negative 02/10/2020 [place] 08/07/2020 0614   PROTEINUR NEGATIVE  08/07/2020 0614   UROBILINOGEN 1.0 02/10/2020 1608   UROBILINOGEN 0.2 02/13/2012 0221   NITRITE NEGATIVE 08/07/2020 0614   LEUKOCYTESUR NEGATIVE 08/07/2020 0614   Sepsis Labs: Invalid input(s): PROCALCITONIN, [place]  Microbiology: Recent Results (from the past 240 hour(s))  SARS CORONAVIRUS 2 (TAT 6-24 HRS) Nasopharyngeal Nasopharyngeal Swab     Status: None   Collection Time: 08/05/20  1:11 AM   Specimen: Nasopharyngeal Swab  Result Value Ref Range Status   SARS Coronavirus 2 NEGATIVE NEGATIVE Final    Comment: (NOTE) SARS-CoV-2 target nucleic acids are NOT DETECTED.  The SARS-CoV-2 RNA is generally detectable in upper and lower respiratory specimens during the acute phase of infection. Negative results do not preclude SARS-CoV-2 infection, do not rule out co-infections with other pathogens, and should not be used as the sole basis for treatment or other patient management decisions. Negative results must be combined with clinical observations, patient history, and epidemiological information. The expected result is Negative.  Fact Sheet for Patients: SugarRoll.be  Fact Sheet for Healthcare Providers: https://www.woods-mathews.com/  This test is not yet approved or cleared by the [place] FDA and  has been authorized for detection and/or diagnosis of SARS-CoV-2 by FDA under an Emergency Use Authorization (EUA). This EUA will remain  in effect (meaning this test can be used) for the duration of the COVID-19 declaration under Se ction 564(b)(1) of the Act, 21 U.S.C. section 360bbb-3(b)(1), unless the authorization is terminated or revoked sooner.  Performed at [facility], [place] [address] [place], [place] [address]   MRSA PCR Screening     Status: None   Collection Time: 08/06/20  9:18 PM   Specimen: Nasal Mucosa; Nasopharyngeal  Result Value Ref Range Status   MRSA by PCR NEGATIVE NEGATIVE Final    Comment:         The GeneXpert MRSA Assay (FDA approved for NASAL specimens only), is one component of a comprehensive MRSA colonization surveillance program. It is not intended to diagnose MRSA infection nor to guide or monitor treatment for MRSA infections. Performed at [facility], [place] [address] [place],  [place] [address]     Radiology Studies: DG Wrist Complete Right  Result Date: 08/07/2020 CLINICAL DATA:  Fall at home, pain EXAM: RIGHT WRIST - COMPLETE 3+ VIEW COMPARISON:  None. FINDINGS: Alignment is anatomic. Osseous demineralization. No acute fracture. Mild degenerative changes are present at the wrist. More pronounced degenerative changes are present at the visualized interphalangeal joints. Vascular calcifications. IMPRESSION: No acute fracture. Electronically Signed   By: Macy Mis M.D.   On: 08/07/2020 11:28   MR BRAIN WO CONTRAST  Result Date: 08/06/2020 CLINICAL DATA:  Near syncope EXAM: MRI HEAD WITHOUT CONTRAST TECHNIQUE: Multiplanar, multiecho pulse sequences of the brain and surrounding structures were obtained without intravenous contrast. COMPARISON:  05/27/2020 FINDINGS: Brain: There is no acute infarction or intracranial hemorrhage. There is no intracranial mass, mass effect, or edema. There is no hydrocephalus or extra-axial fluid collection. Prominence of the ventricles and sulci reflects generalized parenchymal volume loss. Patchy and confluent areas of T2 hyperintensity in the supratentorial white matter are nonspecific but probably reflect moderate to advanced chronic microvascular ischemic changes. There are chronic small vessel infarcts of the bilateral basal ganglia bilateral thalami, left thalamus, and bilateral cerebellar hemispheres. Punctate focus of susceptibility hypointensity in the left frontal subcortical white matter is most compatible with a chronic microhemorrhage. Vascular: Major vessel flow voids at the skull base are preserved. Skull and  upper cervical spine: Normal marrow signal is preserved. Sinuses/Orbits: Paranasal sinuses are aerated. Orbits are unremarkable. Other: Sella is unremarkable.  Mastoid air cells are clear. IMPRESSION: No evidence of recent infarction, hemorrhage, or mass. No significant change since 05/27/2020 with nonemergent findings detailed above. Electronically Signed   By: Macy Mis M.D.   On: 08/06/2020 14:29     Elizjah Noblet T. [place]  If 7PM-7AM, please contact night-coverage [email or web address] 08/07/2020, 12:26 PM

## 2020-08-07 NOTE — Progress Notes (Signed)
Physical Therapy Treatment Patient Details Name: [name] MRN: [id number] DOB: 1921/01/23 Today's Date: 08/07/2020    History of Present Illness 87 yo female admitted with syncope, fall, dizziness, R head laceration. Hx of A fib, CAD, CHF, pleural effusions    PT Comments    Pt back in bed upon arrival to room however agreeable for positional testing for BPPV.  Pt denies symptoms and no nystagmus observed with horizontal canal testing.  Pt reports mild spinning but mostly "pressure" with left [employer] and no nystagmus observed however pt with more difficulty keeping eyes open with [employer].  With right Marye Round, pt was (+) for torsional nystagmus which lasted 10-15 seconds with spinning sensation which also resolved.  With testing, pt appears to have right posterior canal canalithiasis, so performed canal repositioning technique.  Testing and repositioning technique required increased time due to pt moving slowly and therapist explanations.  Pt also with forehead laceration and ecchymosis to right orbital area.  Pt declined mobilizing after and requested return to bed.  Pt would benefit from SNF for rehab upon d/c as she does not appear at her mobility baseline and at high risk for falling again.    Follow Up Recommendations  SNF;Supervision/Assistance - 24 hour     Equipment Recommendations  None recommended by PT    Recommendations for Other Services       Precautions / Restrictions Precautions Precautions: Fall Precaution Comments: dizziness Restrictions Weight Bearing Restrictions: No    Mobility  Bed Mobility Overal bed mobility: Needs Assistance Bed Mobility: Rolling;Sidelying to Sit;Sit to Sidelying Rolling: Modified independent (Device/Increase time) Sidelying to sit: Min assist Supine to sit: Min assist;HOB elevated Sit to supine: Min assist   General bed mobility comments: assist for trunk due to pain in right wrist (has brace at this time) and  buttocks    Transfers Overall transfer level: Needs assistance Equipment used: Rolling walker (2 wheeled) Transfers: Sit to/from Stand Sit to Stand: Min assist         General transfer comment: pt declined mobility after positional testing for BPPV  Ambulation/Gait                 Stairs             Wheelchair Mobility    Modified Rankin (Stroke Patients Only)       Balance Overall balance assessment: Needs assistance Sitting-balance support: No upper extremity supported Sitting balance-Leahy Scale: Fair Sitting balance - Comments: close min guard due to dizziness   Standing balance support: During functional activity;Bilateral upper extremity supported Standing balance-Leahy Scale: Poor Standing balance comment: limited by persistent dizziness                            Cognition Arousal/Alertness: Awake/alert Behavior During Therapy: WFL for tasks assessed/performed Overall Cognitive Status: Within Functional Limits for tasks assessed                                        Exercises      General Comments        Pertinent Vitals/Pain Pain Assessment: Faces Faces Pain Scale: Hurts even more Pain Location: buttocks, right wrist Pain Descriptors / Indicators: Discomfort;Grimacing;Sore;Guarding Pain Intervention(s): Repositioned;Monitored during session    Home Living  Prior Function            PT Goals (current goals can now be found in the care plan section) Acute Rehab PT Goals Patient Stated Goal: move without getting dizzy Progress towards PT goals: Progressing toward goals    Frequency    Min 3X/week      PT Plan Current plan remains appropriate    Co-evaluation              AM-PAC PT "6 Clicks" Mobility   Outcome Measure  Help needed turning from your back to your side while in a flat bed without using bedrails?: A Little Help needed moving from lying on  your back to sitting on the side of a flat bed without using bedrails?: A Little Help needed moving to and from a bed to a chair (including a wheelchair)?: A Little Help needed standing up from a chair using your arms (e.g., wheelchair or bedside chair)?: A Lot Help needed to walk in hospital room?: A Lot Help needed climbing 3-5 steps with a railing? : A Lot 6 Click Score: 15    End of Session   Activity Tolerance: Patient limited by fatigue Patient left: in bed;with call bell/phone within reach;with bed alarm set Nurse Communication: Mobility status PT Visit Diagnosis: History of falling (Z91.81);Repeated falls (R29.6);Muscle weakness (generalized) (M62.81);Difficulty in walking, not elsewhere classified (R26.2);BPPV BPPV - Right/Left : Right     Time: 9470-9628 PT Time Calculation (min) (ACUTE ONLY): 26 min  Charges:  $Canalith Rep Proc: 8-22 mins $Physical Performance Test: 8-22 mins                    Arlyce Dice, DPT Acute Rehabilitation Services Pager: [phone] Office: [phone]  York Ram E 08/07/2020, 11:55 AM

## 2020-08-07 NOTE — NC FL2 (Signed)
[place] LEVEL OF CARE SCREENING TOOL     IDENTIFICATION  Patient Name: [name] Birthdate: 1920/12/20 Sex: female Admission Date (Current Location): 08/04/2020  [facility] [place] and [place] Number:  Herbalist and Address:  [facility],  [place] [address] [place]      Provider Number: [id number]  Attending Physician Name and Address:  Mercy Riding, MD  Relative Name and Phone Number:  Zamaria Brazzle son [phone]    Current Level of Care: Hospital Recommended Level of Care: [place] Prior Approval Number:    Date Approved/Denied:   PASRR Number: [id number]  Discharge Plan: SNF    Current Diagnoses: Patient Active Problem List   Diagnosis Date Noted  . Near syncope 08/05/2020  . Rapid atrial fibrillation ([place]) 05/26/2020  . Acute pulmonary embolism ([place]) 02/10/2020  . Chronic diastolic CHF (congestive heart failure) ([place]) 02/10/2020  . New onset atrial fibrillation ([place]) 02/10/2020  . Lung nodule 02/10/2020  . History of echocardiogram   . GERD (gastroesophageal reflux disease)   . Diverticulosis   . Carotid bruit   . IBS (irritable bowel syndrome)   . Hypothyroidism   . HTN (hypertension)   . HLD (hyperlipidemia)   . CAD (coronary artery disease)   . Atypical chest pain 06/14/2012  . Chest pain 02/12/2012  . Fatigue 04/22/2011  . CAROTID BRUIT 03/19/2009  . HYPOTHYROIDISM 08/29/2008  . HEMORRHOIDS 08/29/2008  . GERD 08/29/2008  . DIVERTICULOSIS, COLON 08/29/2008  . HYPERLIPIDEMIA 07/04/2008  . HYPERTENSION, BENIGN 07/04/2008  . CAD, NATIVE VESSEL 07/04/2008  . IRRITABLE BOWEL SYNDROME 07/04/2008    Orientation RESPIRATION BLADDER Height & Weight     Self,Time,Situation,Place  Normal Continent Weight: 49.9 kg Height:  5\' 5"  (165.1 cm)  BEHAVIORAL SYMPTOMS/MOOD NEUROLOGICAL BOWEL NUTRITION STATUS      Continent Diet (Heart Healthy)  AMBULATORY STATUS COMMUNICATION OF NEEDS Skin   Limited  Assist Verbally Normal                       Personal Care Assistance Level of Assistance  Bathing,Feeding,Dressing Bathing Assistance: Limited assistance Feeding assistance: Limited assistance Dressing Assistance: Limited assistance     Functional Limitations Info  Sight,Speech,Hearing Sight Info: Adequate Hearing Info: Adequate Speech Info: Impaired    SPECIAL CARE FACTORS FREQUENCY  PT (By licensed PT),OT (By licensed OT)     PT Frequency: 5 x weekly OT Frequency: 5 x weekly            Contractures Contractures Info: Not present    Additional Factors Info  Code Status,Allergies Code Status Info: DNR Allergies Info: None Known           Current Medications (08/07/2020):  This is the current hospital active medication list Current Facility-Administered Medications  Medication Dose Route Frequency Provider Last Rate Last Admin  . acetaminophen (TYLENOL) tablet 650 mg  650 mg Oral Q6H PRN Rise Patience, MD   650 mg at 08/06/20 2034   Or  . acetaminophen (TYLENOL) suppository 650 mg  650 mg Rectal Q6H PRN Rise Patience, MD      . apixaban Arne Cleveland) tablet 2.5 mg  2.5 mg Oral BID Wendee Beavers T, MD   2.5 mg at 08/07/20 0934  . atorvastatin (LIPITOR) tablet 10 mg  10 mg Oral QHS Rise Patience, MD   10 mg at 08/06/20 2100  . diltiazem (CARDIZEM) tablet 60 mg  60 mg Oral TID Hal Hope,  Doreatha Lew, MD   60 mg at 08/07/20 0932  . hydrOXYzine (ATARAX/VISTARIL) tablet 25 mg  25 mg Oral QHS PRN Rise Patience, MD      . levothyroxine (SYNTHROID) tablet 100 mcg  100 mcg Oral Daily Rise Patience, MD   100 mcg at 08/07/20 0500  . metoprolol tartrate (LOPRESSOR) tablet 100 mg  100 mg Oral BID Rise Patience, MD   100 mg at 08/07/20 0932  . mirabegron ER (MYRBETRIQ) tablet 50 mg  50 mg Oral QHS Rise Patience, MD   50 mg at 08/06/20 2100  . nitroGLYCERIN (NITROSTAT) SL tablet 0.4 mg  0.4 mg Sublingual Q5 min PRN Rise Patience,  MD      . trimethoprim (TRIMPEX) tablet 100 mg  100 mg Oral Daily Rise Patience, MD   100 mg at 08/07/20 0932  . vitamin B-12 (CYANOCOBALAMIN) tablet 500 mcg  500 mcg Oral Daily Rise Patience, MD   500 mcg at 08/07/20 5916     Discharge Medications: Please see discharge summary for a list of discharge medications.  Relevant Imaging Results:  Relevant Lab Results:   Additional Information SS#[phone]  Kilian Schwartz, Marjie Skiff, RN

## 2020-08-08 LAB — RENAL FUNCTION PANEL
Albumin: 2.9 g/dL — ABNORMAL LOW (ref 3.5–5.0)
Anion gap: 8 (ref 5–15)
BUN: 23 mg/dL (ref 8–23)
CO2: 27 mmol/L (ref 22–32)
Calcium: 9.4 mg/dL (ref 8.9–10.3)
Chloride: 107 mmol/L (ref 98–111)
Creatinine, Ser: 1.05 mg/dL — ABNORMAL HIGH (ref 0.44–1.00)
GFR, Estimated: 48 mL/min — ABNORMAL LOW (ref >60)
Glucose, Bld: 92 mg/dL (ref 70–99)
Phosphorus: 3.9 mg/dL (ref 2.5–4.6)
Potassium: 4.5 mmol/L (ref 3.5–5.1)
Sodium: 142 mmol/L (ref 135–145)

## 2020-08-08 LAB — MAGNESIUM: Magnesium: 2.2 mg/dL (ref 1.7–2.4)

## 2020-08-08 NOTE — Plan of Care (Signed)
  Problem: Coping: Goal: Level of anxiety will decrease Outcome: Progressing   Problem: Elimination: Goal: Will not experience complications related to urinary retention Outcome: Progressing   Problem: Pain Managment: Goal: General experience of comfort will improve Outcome: Progressing   

## 2020-08-08 NOTE — Progress Notes (Signed)
PROGRESS NOTE  [name] [id number]:[id number] DOB: [birth year]   PCP: Fatima Sanger, FNP  Patient is from: ALF  DOA: 08/04/2020 LOS: 2  Chief complaints: Dizziness, fall  Brief Narrative / Interim history: 87 year old F with PMH of diastolic CHF, A. fib on Eliquis, CAD, PE and hypothyroidism return to ED with dizziness.  Patient was seen in ED due to forehead laceration after fall at ALF the day prior to admission and discharged back to ALF but continued to have dizziness and brought back to ED.  In ED, CTH without acute finding.  EKG with rate controlled A. Fib.  Hgb 11 (baseline~14).  Fecal Hemoccult negative.  She was admitted with working diagnosis of near syncope for observation.  Patient continues to have significant vertigo with minimal movement of the head concerning for BPPV and possibly concussion.  Carotid Dopplers without significant finding.  MRI brain without acute finding.  Patient had positive right Dix-Hallpike with torsional nystagmus lasting about 10 to 15 seconds suggesting BPPV.  Therapy recommended SNF.  TOC consulted for SNF placement.  Subjective: Seen and examined earlier this morning.  No major events overnight of this morning.  Headache continues to feel heavy.  Continues to have dizziness with movement.  Right wrist pain improved.  Denies chest pain or dyspnea.  Objective: Vitals:   08/07/20 1227 08/07/20 1523 08/07/20 2021 08/08/20 0606  BP: (!) 95/49 110/69 (!) 107/57 137/73  Pulse: (!) 58 61 60 99  Resp: 18 18 18 18   Temp: 97.8 F (36.6 C) 97.6 F (36.4 C) 97.7 F (36.5 C) (!) 97.5 F (36.4 C)  TempSrc: Oral Oral Oral Oral  SpO2: 97% 98% 92% 96%  Weight:      Height:        Intake/Output Summary (Last 24 hours) at 08/08/2020 1314 Last data filed at 08/08/2020 0900 Gross per 24 hour  Intake 180 ml  Output 375 ml  Net -195 ml   Filed Weights   08/04/20 2209  Weight: 49.9 kg    Examination:  GENERAL: No apparent distress.   Nontoxic. HEENT: MMM.  Laceration over right frontal head with sutures.  Bruising and ecchymosis about the right eye. NECK: Supple.  No apparent JVD.  RESP: On RA.  No IWOB.  Fair aeration bilaterally. CVS:  RRR. Heart sounds normal.  ABD/GI/GU: BS+. Abd soft, NTND.  MSK/EXT:  Moves extremities. No apparent deformity. No edema.  SKIN: no apparent skin lesion or wound NEURO: Sleepy but wakes to voice.  Oriented appropriately.  No apparent focal neuro deficit but limited exam PSYCH: Calm. Normal affect.  Procedures:  None  Microbiology summarized: COVID-19 PCR nonreactive.  Assessment & Plan: Dizziness/vertigo-likely due to BPPV and possible concussion from fall.  She has right beating nystagmus on exam but no other focal neurodeficit.  Right Dix-Hallpike positive with torsional nystagmus lasting 10 to 15 seconds with a spinning sensation.  Orthostatic vitals negative.  No significant finding on EKG. CT head without contrast, CT cervical spine, carotid Doppler and MRI brain without acute or significant finding.  TSH elevated to 27.  -Continue vestibular PT -Therapy recommended SNF. -Fall precautions.  Fall at ALF likely due to vertigo. Skin laceration over right forehead -Fall precautions -Continue vestibular PT -Five interrupted sutures on 4/25 in ED.  Suture removal today.  Discussed with RN.  AKI on CKD-3B/azotemia: Resolving. Recent Labs    05/26/20 1506 05/27/20 0054 05/28/20 7026 05/30/20 0041 05/31/20 0818 08/03/20 1215 08/04/20 2237 08/05/20 3785 08/06/20 8850 08/08/20 2774  BUN 13 9 7* 10 8 18  30* 28* 21 23  CREATININE 0.90 0.76 0.89 1.08* 1.06* 1.25* 1.41* 1.33* 1.02* 1.05*  -Continue holding nephrotoxic meds  Permanent A. Fib: Rate controlled.  CHA2DS2-VASc score 6. -Continue home metoprolol and Cardizem -Low-dose Eliquis  History of PE-diagnosed in 02/2020.  Unclear if it is provoked or unprovoked. -Low-dose Eliquis-  Chronic diastolic CHF: TTE in  25/0539 with LVEF of 60 to 65% and RVSP of 54.5 mmHg.  On p.o. Lasix 20 mg daily at home.  Appears euvolemic. -Continue holding Lasix -Monitor respiratory and fluid status  History of CAD s/p DES to LAD and dRCA no chest pain.  No acute ischemic finding on EKG. -Continue metoprolol tartrate and statin  Hypothyroidism: TSH elevated to 26.4.  27 on recheck.  (0.5 about 2 months ago).  Maybe not be getting her Synthroid at ALF. -Continue home Synthroid -Recheck TSH in 3 to 4 weeks  Urinary incontinence/recurrent UTI -Continue home trimethoprim and Myrbetriq  Underweight Body mass index is 18.3 kg/m.  -Consult dietitian       DVT prophylaxis:  apixaban (ELIQUIS) tablet 2.5 mg Start: 08/05/20 2200 apixaban (ELIQUIS) tablet 2.5 mg  Code Status: DNR/DNI Family Communication: Updated patient's son at bedside on 4/29. Level of care: Telemetry Status is: Inpatient  Remains inpatient appropriate because:Unsafe d/c plan   Dispo: The patient is from: ALF              Anticipated d/c is to: SNF              Patient currently is medically stable to d/c.   Difficult to place patient No            Consultants:  None   Sch Meds:  Scheduled Meds: . apixaban  2.5 mg Oral BID  . atorvastatin  10 mg Oral QHS  . diltiazem  60 mg Oral TID  . levothyroxine  100 mcg Oral Daily  . metoprolol tartrate  100 mg Oral BID  . mirabegron ER  50 mg Oral QHS  . trimethoprim  100 mg Oral Daily  . vitamin B-12  500 mcg Oral Daily   Continuous Infusions: PRN Meds:.acetaminophen **OR** acetaminophen, hydrOXYzine, nitroGLYCERIN  Antimicrobials: Anti-infectives (From admission, onward)   Start     Dose/Rate Route Frequency Ordered Stop   08/05/20 1000  trimethoprim (TRIMPEX) tablet 100 mg        100 mg Oral Daily 08/05/20 0317         I have personally reviewed the following labs and images: CBC: Recent Labs  Lab 08/03/20 1215 08/04/20 2237 08/05/20 0625 08/06/20 0529  WBC  11.9* 8.2 8.4 8.0  NEUTROABS 8.1* 4.4  --   --   HGB 15.3* 11.9* 14.5 13.8  HCT 48.0* 37.8 44.8 44.1  MCV 108.1* 107.7* 106.9* 107.8*  PLT 202 173 210 214   BMP &GFR Recent Labs  Lab 08/03/20 1215 08/04/20 2237 08/05/20 0625 08/06/20 0529 08/08/20 0415  NA 138 139 139 140 142  K 4.8 3.5 4.0 4.8 4.5  CL 99 101 100 104 107  CO2 27 29 28 27 27   GLUCOSE 100* 107* 103* 84 92  BUN 18 30* 28* 21 23  CREATININE 1.25* 1.41* 1.33* 1.02* 1.05*  CALCIUM 9.3 8.4* 9.2 9.3 9.4  MG  --   --   --  2.0 2.2  PHOS  --   --   --  3.2 3.9   Estimated Creatinine Clearance: 24.1 mL/min (A) (by  C-G formula based on SCr of 1.05 mg/dL (H)). Liver & Pancreas: Recent Labs  Lab 08/06/20 0529 08/08/20 0415  ALBUMIN 3.1* 2.9*   No results for input(s): LIPASE, AMYLASE in the last 168 hours. No results for input(s): AMMONIA in the last 168 hours. Diabetic: No results for input(s): HGBA1C in the last 72 hours. No results for input(s): GLUCAP in the last 168 hours. Cardiac Enzymes: No results for input(s): CKTOTAL, CKMB, CKMBINDEX, TROPONINI in the last 168 hours. No results for input(s): PROBNP in the last 8760 hours. Coagulation Profile: No results for input(s): INR, PROTIME in the last 168 hours. Thyroid Function Tests: Recent Labs    08/06/20 0529  TSH 27.156*   Lipid Profile: No results for input(s): CHOL, HDL, LDLCALC, TRIG, CHOLHDL, LDLDIRECT in the last 72 hours. Anemia Panel: Recent Labs    08/06/20 0529  VITAMINB12 1,054*  FOLATE 12.1  FERRITIN 45  TIBC 338  IRON 61  RETICCTPCT 1.7   Urine analysis:    Component Value Date/Time   COLORURINE YELLOW 08/07/2020 0614   APPEARANCEUR CLEAR 08/07/2020 0614   LABSPEC 1.020 08/07/2020 0614   PHURINE 5.0 08/07/2020 0614   GLUCOSEU NEGATIVE 08/07/2020 0614   HGBUR NEGATIVE 08/07/2020 0614   BILIRUBINUR NEGATIVE 08/07/2020 0614   BILIRUBINUR negative 02/10/2020 [place] 08/07/2020 0614   PROTEINUR NEGATIVE  08/07/2020 0614   UROBILINOGEN 1.0 02/10/2020 1608   UROBILINOGEN 0.2 02/13/2012 0221   NITRITE NEGATIVE 08/07/2020 0614   LEUKOCYTESUR NEGATIVE 08/07/2020 0614   Sepsis Labs: Invalid input(s): PROCALCITONIN, [place]  Microbiology: Recent Results (from the past 240 hour(s))  SARS CORONAVIRUS 2 (TAT 6-24 HRS) Nasopharyngeal Nasopharyngeal Swab     Status: None   Collection Time: 08/05/20  1:11 AM   Specimen: Nasopharyngeal Swab  Result Value Ref Range Status   SARS Coronavirus 2 NEGATIVE NEGATIVE Final    Comment: (NOTE) SARS-CoV-2 target nucleic acids are NOT DETECTED.  The SARS-CoV-2 RNA is generally detectable in upper and lower respiratory specimens during the acute phase of infection. Negative results do not preclude SARS-CoV-2 infection, do not rule out co-infections with other pathogens, and should not be used as the sole basis for treatment or other patient management decisions. Negative results must be combined with clinical observations, patient history, and epidemiological information. The expected result is Negative.  Fact Sheet for Patients: SugarRoll.be  Fact Sheet for Healthcare Providers: https://www.woods-mathews.com/  This test is not yet approved or cleared by the [place] FDA and  has been authorized for detection and/or diagnosis of SARS-CoV-2 by FDA under an Emergency Use Authorization (EUA). This EUA will remain  in effect (meaning this test can be used) for the duration of the COVID-19 declaration under Se ction 564(b)(1) of the Act, 21 U.S.C. section 360bbb-3(b)(1), unless the authorization is terminated or revoked sooner.  Performed at [facility], [place] [address], [place], [place] [address]   MRSA PCR Screening     Status: None   Collection Time: 08/06/20  9:18 PM   Specimen: Nasal Mucosa; Nasopharyngeal  Result Value Ref Range Status   MRSA by PCR NEGATIVE NEGATIVE Final    Comment:         The GeneXpert MRSA Assay (FDA approved for NASAL specimens only), is one component of a comprehensive MRSA colonization surveillance program. It is not intended to diagnose MRSA infection nor to guide or monitor treatment for MRSA infections. Performed at [facility], [place] [address] [place], [place] [address]  Radiology Studies: No results found.   Catalina Salasar T. [place]  If 7PM-7AM, please contact night-coverage [email or web address] 08/08/2020, 1:14 PM

## 2020-08-08 NOTE — Progress Notes (Signed)
Physical Therapy Treatment Patient Details Name: [name] MRN: [id number] DOB: 1920/11/19 Today's Date: 08/08/2020    History of Present Illness 87 yo female admitted with syncope, fall, dizziness, R head laceration. Hx of A fib, CAD, CHF, pleural effusions    PT Comments    Patient resting in bed on right side. Patient reports nausea  And dizziness to sit up from side. Patient transferred to [facility] and back to bed. Patient reports urge to urinate but unable to do so. Sat on BSC for ~ 5 minutes without urination. Assisted back to bed. Patient unable to ambulate at this time. Patient reports that she feels terrible, dizziness persisting and some onset of nausea. Patient does not specify If she feels  Any spinning or room, just "dizziness".  Continue PT for  vestibular treatment and mobility.   Follow Up Recommendations  SNF;Supervision/Assistance - 24 hour     Equipment Recommendations  None recommended by PT    Recommendations for Other Services       Precautions / Restrictions Precautions Precautions: Fall Precaution Comments: dizziness Required Braces or Orthoses: Splint/Cast Splint/Cast: on right wrist, ? comfort, , negative fx    Mobility  Bed Mobility Overal bed mobility: Needs Assistance Bed Mobility: Sidelying to Sit;Sit to Sidelying   Sidelying to sit: Min assist     Sit to sidelying: Min assist General bed mobility comments: cues to keep head turned to the right when rising from sidelying.    Transfers Overall transfer level: Needs assistance   Transfers: Sit to/from Stand;Stand Pivot Transfers Sit to Stand: Min assist Stand pivot transfers: Min assist       General transfer comment: patient assisted  to transfer to [facility] then back to bed. Reports dizziness and HA when moving. also nausea  Ambulation/Gait             General Gait Details: unable due to feeling nausea   Stairs             Wheelchair Mobility    Modified Rankin  (Stroke Patients Only)       Balance Overall balance assessment: Needs assistance;History of Falls Sitting-balance support: Feet supported;No upper extremity supported Sitting balance-Leahy Scale: Fair Sitting balance - Comments: close min guard due to dizziness   Standing balance support: During functional activity;Single extremity supported   Standing balance comment: to transfer to [place]: Awake/alert Behavior During Therapy: WFL for tasks assessed/performed Overall Cognitive Status: Within Functional Limits for tasks assessed                                        Exercises      General Comments        Pertinent Vitals/Pain Faces Pain Scale: Hurts even more Pain Location: head Pain Descriptors / Indicators: Discomfort;Grimacing;Sore;Guarding Pain Intervention(s): Limited activity within patient's tolerance;Monitored during session    Home Living                      Prior Function            PT Goals (current goals can now be found in the care plan section) Progress towards PT goals: Not progressing toward goals - comment (dizziness persisitne, weakness)  Frequency    Min 2X/week      PT Plan Current plan remains appropriate;Frequency needs to be updated    Co-evaluation              AM-PAC PT "6 Clicks" Mobility   Outcome Measure  Help needed turning from your back to your side while in a flat bed without using bedrails?: A Little Help needed moving from lying on your back to sitting on the side of a flat bed without using bedrails?: A Little Help needed moving to and from a bed to a chair (including a wheelchair)?: A Little Help needed standing up from a chair using your arms (e.g., wheelchair or bedside chair)?: A Lot Help needed to walk in hospital room?: A Lot Help needed climbing 3-5 steps with a railing? : A Lot 6 Click Score: 15    End of  Session   Activity Tolerance: Patient limited by fatigue Patient left: in bed;with call bell/phone within reach;with bed alarm set Nurse Communication: Mobility status PT Visit Diagnosis: History of falling (Z91.81);Repeated falls (R29.6);Muscle weakness (generalized) (M62.81);Difficulty in walking, not elsewhere classified (R26.2);BPPV;Dizziness and giddiness (R42) BPPV - Right/Left : Right     Time: 3976-7341 PT Time Calculation (min) (ACUTE ONLY): 23 min  Charges:  $Therapeutic Activity: 23-37 mins                     Tresa Endo PT Acute Rehabilitation Services Pager [phone] Office [phone]    Claretha Cooper 08/08/2020, 3:35 PM

## 2020-08-09 NOTE — Progress Notes (Signed)
PROGRESS NOTE  [name] EHU:[id number] DOB: 18-May-1920   PCP: Holland Commons, FNP  Patient is from: ALF  DOA: 08/04/2020 LOS: 3  Chief complaints: Dizziness, fall  Brief Narrative / Interim history: 87 year old F with PMH of diastolic CHF, A. fib on Eliquis, CAD, PE and hypothyroidism return to ED with dizziness.  Patient was seen in ED due to forehead laceration after fall at ALF the day prior to admission and discharged back to ALF but continued to have dizziness and brought back to ED.  In ED, CTH without acute finding.  EKG with rate controlled A. Fib.  Hgb 11 (baseline~14).  Fecal Hemoccult negative.  She was admitted with working diagnosis of near syncope for observation.  Carotid Dopplers without significant finding.  MRI brain without acute finding.  Patient had positive right Dix-Hallpike with torsional nystagmus lasting about 10 to 15 seconds suggesting BPPV.  Therapy recommended SNF.  TOC consulted for SNF placement.  Subjective: Seen and examined earlier this morning.  No major events overnight of this morning.  No complaints other than dizziness when she moves her head around.  She denies headache today.  Right wrist pain resolved.  She denies chest pain, dyspnea, GI or UTI symptoms.  Objective: Vitals:   08/08/20 1406 08/08/20 1801 08/08/20 2053 08/09/20 0457  BP: (!) 91/40 135/84 115/66 (!) 141/80  Pulse: (!) 51  70 73  Resp: 20  14 16   Temp: 98 F (36.7 C)  98 F (36.7 C) 97.8 F (36.6 C)  TempSrc: Oral  Oral Oral  SpO2: 96%  97% 97%  Weight:      Height:        Intake/Output Summary (Last 24 hours) at 08/09/2020 1142 Last data filed at 08/08/2020 1335 Gross per 24 hour  Intake 120 ml  Output 100 ml  Net 20 ml   Filed Weights   08/04/20 2209  Weight: 49.9 kg    Examination:  GENERAL: No apparent distress.  Nontoxic. HEENT: MMM.  Bruising about right eye/right face improved.  States removed. NECK: Supple.  No apparent JVD.  RESP:  No IWOB.   Fair aeration bilaterally. CVS:  RRR. Heart sounds normal.  ABD/GI/GU: BS+. Abd soft, NTND.  MSK/EXT:  Moves extremities. No apparent deformity. No edema.  SKIN: Bruising and facial laceration as above NEURO: Awake, alert and oriented appropriately.  No apparent focal neuro deficit. PSYCH: Calm. Normal affect.   Procedures:  None  Microbiology summarized: COVID-19 PCR nonreactive.  Assessment & Plan: Dizziness/vertigo-likely due to BPPV and possible concussion from fall.  Right Dix-Hallpike positive with torsional nystagmus lasting 10 to 15 seconds with a spinning sensation.  Orthostatic vitals negative.  No significant finding on EKG. CT head without contrast, CT cervical spine, carotid Doppler and MRI brain without acute or significant finding.  TSH elevated to 27.  Dizziness improving. -Continue vestibular PT -Therapy recommended SNF. -Fall precautions.  Fall at ALF likely due to vertigo. Skin laceration over right forehead -Fall precautions -Continue vestibular PT - stitches removed on 4/30.  AKI on CKD-3B/azotemia: Resolving. Recent Labs    05/26/20 1506 05/27/20 0054 05/28/20 0039 05/30/20 0041 05/31/20 0818 08/03/20 1215 08/04/20 2237 08/05/20 0625 08/06/20 0529 08/08/20 0415  BUN 13 9 7* 10 8 18  30* 28* 21 23  CREATININE 0.90 0.76 0.89 1.08* 1.06* 1.25* 1.41* 1.33* 1.02* 1.05*  -Continue holding nephrotoxic meds  Permanent A. Fib: Rate controlled.  CHA2DS2-VASc score 6. -Continue home metoprolol and Cardizem -Low-dose Eliquis  History of  PE-diagnosed in 02/2020.  Unclear if it is provoked or unprovoked. -Low-dose Eliquis-  Chronic diastolic CHF: TTE in 29/5284 with LVEF of 60 to 65% and RVSP of 54.5 mmHg.  On p.o. Lasix 20 mg daily at home.  Appears euvolemic. -Continue holding Lasix -Monitor respiratory and fluid status  History of CAD s/p DES to LAD and dRCA no chest pain.  No acute ischemic finding on EKG. -Continue metoprolol tartrate and  statin  Hypothyroidism: TSH elevated to 26.4.  27 on recheck.  (0.5 about 2 months ago).  Maybe not be getting her Synthroid at ALF. -Continue home Synthroid -Recheck TSH in 3 to 4 weeks  Urinary incontinence/recurrent UTI -Continue home trimethoprim and Myrbetriq  Underweight Body mass index is 18.3 kg/m.  -Consult dietitian       DVT prophylaxis:  apixaban (ELIQUIS) tablet 2.5 mg Start: 08/05/20 2200 apixaban (ELIQUIS) tablet 2.5 mg  Code Status: DNR/DNI Family Communication: Updated patient's son at bedside on 4/29. Level of care: Telemetry Status is: Inpatient  Remains inpatient appropriate because:Unsafe d/c plan   Dispo: The patient is from: ALF              Anticipated d/c is to: SNF              Patient currently is medically stable to d/c.   Difficult to place patient No            Consultants:  None   Sch Meds:  Scheduled Meds: . apixaban  2.5 mg Oral BID  . atorvastatin  10 mg Oral QHS  . diltiazem  60 mg Oral TID  . levothyroxine  100 mcg Oral Daily  . metoprolol tartrate  100 mg Oral BID  . mirabegron ER  50 mg Oral QHS  . trimethoprim  100 mg Oral Daily  . vitamin B-12  500 mcg Oral Daily   Continuous Infusions: PRN Meds:.acetaminophen **OR** acetaminophen, hydrOXYzine, nitroGLYCERIN  Antimicrobials: Anti-infectives (From admission, onward)   Start     Dose/Rate Route Frequency Ordered Stop   08/05/20 1000  trimethoprim (TRIMPEX) tablet 100 mg        100 mg Oral Daily 08/05/20 0317         I have personally reviewed the following labs and images: CBC: Recent Labs  Lab 08/03/20 1215 08/04/20 2237 08/05/20 0625 08/06/20 0529  WBC 11.9* 8.2 8.4 8.0  NEUTROABS 8.1* 4.4  --   --   HGB 15.3* 11.9* 14.5 13.8  HCT 48.0* 37.8 44.8 44.1  MCV 108.1* 107.7* 106.9* 107.8*  PLT 202 173 210 214   BMP &GFR Recent Labs  Lab 08/03/20 1215 08/04/20 2237 08/05/20 0625 08/06/20 0529 08/08/20 0415  NA 138 139 139 140 142  K 4.8 3.5  4.0 4.8 4.5  CL 99 101 100 104 107  CO2 27 29 28 27 27   GLUCOSE 100* 107* 103* 84 92  BUN 18 30* 28* 21 23  CREATININE 1.25* 1.41* 1.33* 1.02* 1.05*  CALCIUM 9.3 8.4* 9.2 9.3 9.4  MG  --   --   --  2.0 2.2  PHOS  --   --   --  3.2 3.9   Estimated Creatinine Clearance: 24.1 mL/min (A) (by C-G formula based on SCr of 1.05 mg/dL (H)). Liver & Pancreas: Recent Labs  Lab 08/06/20 0529 08/08/20 0415  ALBUMIN 3.1* 2.9*   No results for input(s): LIPASE, AMYLASE in the last 168 hours. No results for input(s): AMMONIA in the last 168 hours. Diabetic:  No results for input(s): HGBA1C in the last 72 hours. No results for input(s): GLUCAP in the last 168 hours. Cardiac Enzymes: No results for input(s): CKTOTAL, CKMB, CKMBINDEX, TROPONINI in the last 168 hours. No results for input(s): PROBNP in the last 8760 hours. Coagulation Profile: No results for input(s): INR, PROTIME in the last 168 hours. Thyroid Function Tests: No results for input(s): TSH, T4TOTAL, FREET4, T3FREE, THYROIDAB in the last 72 hours. Lipid Profile: No results for input(s): CHOL, HDL, LDLCALC, TRIG, CHOLHDL, LDLDIRECT in the last 72 hours. Anemia Panel: No results for input(s): VITAMINB12, FOLATE, FERRITIN, TIBC, IRON, RETICCTPCT in the last 72 hours. Urine analysis:    Component Value Date/Time   COLORURINE YELLOW 08/07/2020 [place] 08/07/2020 0614   LABSPEC 1.020 08/07/2020 0614   PHURINE 5.0 08/07/2020 0614   GLUCOSEU NEGATIVE 08/07/2020 0614   HGBUR NEGATIVE 08/07/2020 0614   BILIRUBINUR NEGATIVE 08/07/2020 0614   BILIRUBINUR negative 02/10/2020 [place] 08/07/2020 0614   PROTEINUR NEGATIVE 08/07/2020 0614   UROBILINOGEN 1.0 02/10/2020 1608   UROBILINOGEN 0.2 02/13/2012 0221   NITRITE NEGATIVE 08/07/2020 0614   LEUKOCYTESUR NEGATIVE 08/07/2020 0614   Sepsis Labs: Invalid input(s): PROCALCITONIN, [place]  Microbiology: Recent Results (from the past 240 hour(s))   SARS CORONAVIRUS 2 (TAT 6-24 HRS) Nasopharyngeal Nasopharyngeal Swab     Status: None   Collection Time: 08/05/20  1:11 AM   Specimen: Nasopharyngeal Swab  Result Value Ref Range Status   SARS Coronavirus 2 NEGATIVE NEGATIVE Final    Comment: (NOTE) SARS-CoV-2 target nucleic acids are NOT DETECTED.  The SARS-CoV-2 RNA is generally detectable in upper and lower respiratory specimens during the acute phase of infection. Negative results do not preclude SARS-CoV-2 infection, do not rule out co-infections with other pathogens, and should not be used as the sole basis for treatment or other patient management decisions. Negative results must be combined with clinical observations, patient history, and epidemiological information. The expected result is Negative.  Fact Sheet for Patients: SugarRoll.be  Fact Sheet for Healthcare Providers: https://www.woods-mathews.com/  This test is not yet approved or cleared by the [place] FDA and  has been authorized for detection and/or diagnosis of SARS-CoV-2 by FDA under an Emergency Use Authorization (EUA). This EUA will remain  in effect (meaning this test can be used) for the duration of the COVID-19 declaration under Se ction 564(b)(1) of the Act, 21 U.S.C. section 360bbb-3(b)(1), unless the authorization is terminated or revoked sooner.  Performed at [facility], [place] [address] [place], [place] [address]   MRSA PCR Screening     Status: None   Collection Time: 08/06/20  9:18 PM   Specimen: Nasal Mucosa; Nasopharyngeal  Result Value Ref Range Status   MRSA by PCR NEGATIVE NEGATIVE Final    Comment:        The GeneXpert MRSA Assay (FDA approved for NASAL specimens only), is one component of a comprehensive MRSA colonization surveillance program. It is not intended to diagnose MRSA infection nor to guide or monitor treatment for MRSA infections. Performed at [facility], [place] [address], [place], [place] [address]     Radiology Studies: No results found.   Anaaya Fuster T. [place]  If 7PM-7AM, please contact night-coverage [email or web address] 08/09/2020, 11:42 AM

## 2020-08-09 NOTE — Progress Notes (Signed)
Occupational Therapy Treatment Patient Details Name: [name] MRN: [id number] DOB: 1920-08-30 Today's Date: 08/09/2020    History of present illness 87 yo female admitted with syncope, fall, dizziness, R head laceration. Hx of A fib, CAD, CHF, pleural effusions   OT comments  Pt remains limited in progress towards acute OT goals d/t persistent dizziness which worsens in upright position. Pt declined coming to EOB position. RN reports pt was up to bathroom earlier with staff. Worked on building tolerance of upright position by utilizing [employer]. Pt able to tolerate 37* quickly indicated intolerance for 40*. D/c plan remains appropriate.    Follow Up Recommendations  SNF    Equipment Recommendations  None recommended by OT    Recommendations for Other Services      Precautions / Restrictions Precautions Precautions: Fall Precaution Comments: dizziness Required Braces or Orthoses: Splint/Cast Splint/Cast: on right wrist, ? comfort, , negative fx Restrictions Weight Bearing Restrictions: No       Mobility Bed Mobility Overal bed mobility: Needs Assistance Bed Mobility: Rolling Rolling: Modified independent (Device/Increase time)         General bed mobility comments: rolled to right side. extra time and effort. no physical assist    Transfers                 General transfer comment: Pt declined EOB/OOB transfers 2/2 dizziness and headache.    Balance Overall balance assessment: History of Falls                                         ADL either performed or assessed with clinical judgement   ADL Overall ADL's : Needs assistance/impaired   Eating/Feeding Details (indicate cue type and reason): Pt in right sidelying position with r/o dizziness and RUE pain. Cup with straw brought to pt's mouth.                                   General ADL Comments: Pt declined EOB/OOB activity. Worked on increasing Duncan height to  work on improving tolerance for upright position as precursor to ADLs.     Vision       Perception     Praxis      Cognition Arousal/Alertness: Awake/alert Behavior During Therapy: Anxious;WFL for tasks assessed/performed Overall Cognitive Status: No family/caregiver present to determine baseline cognitive functioning                                 General Comments: Answering questions appropriately. Some verbal perseveration noted and internal inconsistencies in her understanding of d/c plan.        Exercises     Shoulder Instructions       General Comments      Pertinent Vitals/ Pain       Pain Assessment: Faces Faces Pain Scale: Hurts even more Pain Location: head, R forearm/wrist Pain Descriptors / Indicators: Discomfort;Grimacing;Sore;Guarding Pain Intervention(s): Monitored during session;Limited activity within patient's tolerance;Repositioned;Patient requesting pain meds-RN notified;Other (comment) (elevated R forearm on pillow)  Home Living  Prior Functioning/Environment              Frequency  Min 2X/week        Progress Toward Goals  OT Goals(current goals can now be found in the care plan section)  Progress towards OT goals: Not progressing toward goals - comment (persistent dizziness, worse in upright position, continues to limit progress with acute OT goals.)  Acute Rehab OT Goals Patient Stated Goal: move without getting dizzy OT Goal Formulation: With patient Time For Goal Achievement: 08/19/20 Potential to Achieve Goals: Fair ADL Goals Pt Will Perform Lower Body Dressing: with supervision;sit to/from stand Pt Will Transfer to Toilet: with supervision;ambulating;regular height toilet;grab bars Pt Will Perform Toileting - Clothing Manipulation and hygiene: with supervision;sit to/from stand Additional ADL Goal #1: Patient will stand at sink to perform grooming task  as evidence of improving activity tolerance  Plan Discharge plan remains appropriate    Co-evaluation                 AM-PAC OT "6 Clicks" Daily Activity     Outcome Measure   Help from another person eating meals?: A Little Help from another person taking care of personal grooming?: A Little Help from another person toileting, which includes using toliet, bedpan, or urinal?: A Little Help from another person bathing (including washing, rinsing, drying)?: A Little Help from another person to put on and taking off regular upper body clothing?: A Little Help from another person to put on and taking off regular lower body clothing?: A Little 6 Click Score: 18    End of Session    OT Visit Diagnosis: Unsteadiness on feet (R26.81);BPPV;Dizziness and giddiness (R42);Muscle weakness (generalized) (M62.81);Pain   Activity Tolerance Other (comment);Patient limited by pain;Patient limited by fatigue (dizziness)   Patient Left in bed;with call bell/phone within reach;with bed alarm set   Nurse Communication Patient requests pain meds        Time: 1115-1130 OT Time Calculation (min): 15 min  Charges: OT General Charges $OT Visit: 1 Visit OT Treatments $Self Care/Home Management : 8-22 mins  Tyrone Schimke, OT Acute Rehabilitation Services Pager: [phone] Office: [phone]    [name] 08/09/2020, 2:39 PM

## 2020-08-10 LAB — RESP PANEL BY RT-PCR (FLU A&B, COVID) ARPGX2
Influenza A by PCR: NEGATIVE
Influenza B by PCR: NEGATIVE
SARS Coronavirus 2 by RT PCR: NEGATIVE

## 2020-08-10 MED ORDER — MAGNESIUM CITRATE PO SOLN: 1.0000 | Freq: Every day | ORAL | Status: AC | PRN

## 2020-08-10 MED ORDER — MAGNESIUM CITRATE PO SOLN
1.0000 | Freq: Every day | ORAL | Status: DC | PRN
Start: 2020-08-10 — End: 2020-08-12

## 2020-08-10 MED ORDER — POLYETHYLENE GLYCOL 3350 17 G PO PACK
17.0000 g | PACK | Freq: Two times a day (BID) | ORAL | Status: DC | PRN
  Administered 2020-08-10: 17 g via ORAL
  Filled 2020-08-10: qty 1

## 2020-08-10 MED ORDER — POLYETHYLENE GLYCOL 3350 17 G PO PACK: 17.0000 g | PACK | Freq: Two times a day (BID) | ORAL | 0 refills | Status: AC | PRN

## 2020-08-10 MED ORDER — APIXABAN 2.5 MG PO TABS: 2.5000 mg | ORAL_TABLET | Freq: Two times a day (BID) | ORAL | 1 refills | Status: AC

## 2020-08-10 MED ORDER — SENNOSIDES-DOCUSATE SODIUM 8.6-50 MG PO TABS: 1.0000 | ORAL_TABLET | Freq: Two times a day (BID) | ORAL | Status: DC | PRN

## 2020-08-10 MED ORDER — SENNOSIDES-DOCUSATE SODIUM 8.6-50 MG PO TABS: 1.0000 | ORAL_TABLET | Freq: Two times a day (BID) | ORAL | Status: AC | PRN

## 2020-08-10 NOTE — TOC Progression Note (Signed)
Transition of Care [facility]) - Progression Note    Patient Details  Name: [name] MRN: [id number] Date of Birth: [birth year]  Transition of [facility]) CM/SW Contact  Lennart Pall, LCSW Phone Number: 08/10/2020, 4:10 PM  Clinical Narrative:    Have presented SNF bed offers to pt/ sons and they have accepted SNF bed at [facility].  Insurance has authorized both SNF bed Josem Kaufmann # [id number]) and ambulance transport Josem Kaufmann # [phone]).  Plan to transfer via [place] tomorrow to facility.   Expected Discharge Plan: [place] Barriers to Discharge: Continued Medical Work up  Expected Discharge Plan and Services Expected Discharge Plan: [place]   Discharge Planning Services: CM Consult Post Acute Care Choice: [place] arrangements for the past 2 months: [place] Expected Discharge Date: 08/10/20                                     Social Determinants of Health (SDOH) Interventions    Readmission Risk Interventions No flowsheet data found.

## 2020-08-10 NOTE — Progress Notes (Signed)
Physical Therapy Treatment Patient Details Name: [name] MRN: [id number] DOB: 09/15/1920 Today's Date: 08/10/2020    History of Present Illness 87 yo female admitted with syncope, fall, dizziness, R head laceration. Hx of A fib, CAD, CHF, pleural effusions    PT Comments    Pt reports constant dizziness however improved today enough to tolerate mobility.  Pt assisted to [facility] and then recliner.  Pt reports generalized weakness and pain.  Continue to recommend SNF upon d/c.   Follow Up Recommendations  SNF     Equipment Recommendations  None recommended by PT    Recommendations for Other Services       Precautions / Restrictions Precautions Precautions: Fall Precaution Comments: dizziness Required Braces or Orthoses: Splint/Cast Splint/Cast: on right wrist, ? comfort, , negative fx    Mobility  Bed Mobility Overal bed mobility: Needs Assistance       Supine to sit: HOB elevated;Min guard     General bed mobility comments: exited left side of bed; extra time and effort. no physical assist    Transfers Overall transfer level: Needs assistance Equipment used: Rolling walker (2 wheeled) Transfers: Sit to/from [facility] Sit to Stand: Min assist Stand pivot transfers: Min assist       General transfer comment: assist for weakness, cues for technique and safety, pivot to [facility] and then recliner - pt reports constant dizziness today  Ambulation/Gait                 Stairs             Wheelchair Mobility    Modified Rankin (Stroke Patients Only)       Balance           Standing balance support: During functional activity;Bilateral upper extremity supported Standing balance-Leahy Scale: Poor Standing balance comment: reliant on UE support and min external assist                            Cognition Arousal/Alertness: Awake/alert Behavior During Therapy: WFL for tasks assessed/performed Overall Cognitive  Status: Impaired/Different from baseline Area of Impairment: Orientation                 Orientation Level: Disoriented to;Time;Place                    Exercises      General Comments        Pertinent Vitals/Pain Pain Assessment: Faces Faces Pain Scale: Hurts even more Pain Location: rectal area Pain Descriptors / Indicators: Discomfort;Grimacing;Sore;Guarding Pain Intervention(s): Repositioned;Monitored during session;Patient requesting pain meds-RN notified    Home Living                      Prior Function            PT Goals (current goals can now be found in the care plan section) Progress towards PT goals: Progressing toward goals    Frequency    Min 2X/week      PT Plan Current plan remains appropriate    Co-evaluation              AM-PAC PT "6 Clicks" Mobility   Outcome Measure  Help needed turning from your back to your side while in a flat bed without using bedrails?: A Little Help needed moving from lying on your back to sitting on the side of a flat bed without using bedrails?: A  Little Help needed moving to and from a bed to a chair (including a wheelchair)?: A Lot Help needed standing up from a chair using your arms (e.g., wheelchair or bedside chair)?: A Lot Help needed to walk in hospital room?: A Lot Help needed climbing 3-5 steps with a railing? : Total 6 Click Score: 13    End of Session Equipment Utilized During Treatment: Gait belt Activity Tolerance: Patient limited by fatigue Patient left: in chair;with call bell/phone within reach;with chair alarm set (repositioned with 2 pillows under pt to assist with pain in rectal area) Nurse Communication: Mobility status;Patient requests pain meds PT Visit Diagnosis: Repeated falls (R29.6);Muscle weakness (generalized) (M62.81);Difficulty in walking, not elsewhere classified (R26.2)     Time: 6433-2951 PT Time Calculation (min) (ACUTE ONLY): 25 min  Charges:   $Therapeutic Activity: 23-37 mins                    Jannette Spanner PT, DPT Acute Rehabilitation Services Pager: [phone] Office: [phone]  [place] Blatchley,KATHrine E 08/10/2020, 1:09 PM

## 2020-08-10 NOTE — Care Management Important Message (Signed)
Important Message  Patient Details IM Letter given to the Patient. Name: [name] MRN: [id number] Date of Birth: Aug 05, 1920   Medicare Important Message Given:  Yes     Kerin Salen 08/10/2020, 11:24 AM

## 2020-08-10 NOTE — Progress Notes (Signed)
PROGRESS NOTE  [name] JSE:[id number] DOB: Jul 16, 1920   PCP: Holland Commons, FNP  Patient is from: ALF  DOA: 08/04/2020 LOS: 4  Chief complaints: Dizziness, fall  Brief Narrative / Interim history: 87 year old F with PMH of diastolic CHF, A. fib on Eliquis, CAD, PE and hypothyroidism return to ED with dizziness.  Patient was seen in ED due to forehead laceration after fall at ALF the day prior to admission and discharged back to ALF but continued to have dizziness and brought back to ED.  In ED, CTH without acute finding.  EKG with rate controlled A. Fib.  Hgb 11 (baseline~14).  Fecal Hemoccult negative.  She was admitted with working diagnosis of near syncope for observation.  Carotid Dopplers without significant finding.  MRI brain without acute finding.  Patient had positive right Dix-Hallpike with torsional nystagmus lasting about 10 to 15 seconds suggesting BPPV.  Therapy recommended SNF.  Plan for transfer to SNF on 5/3.  Subjective: Seen and examined earlier this morning.  No major events overnight of this morning.  Continues to endorse dizziness with head movement.  Denies headache, chest pain, dyspnea, GI or UTI symptoms.  Objective: Vitals:   08/09/20 2140 08/10/20 0436 08/10/20 0909 08/10/20 1356  BP: 106/73 110/64 102/65 105/62  Pulse: 64 76 74 (!) 54  Resp:  16 18 18   Temp:  98.3 F (36.8 C) 98.4 F (36.9 C) 97.8 F (36.6 C)  TempSrc:   Oral Oral  SpO2:  94% 96% 98%  Weight:      Height:        Intake/Output Summary (Last 24 hours) at 08/10/2020 1405 Last data filed at 08/10/2020 1000 Gross per 24 hour  Intake 420 ml  Output 325 ml  Net 95 ml   Filed Weights   08/04/20 2209  Weight: 49.9 kg    Examination: GENERAL: No apparent distress.  Nontoxic. HEENT: MMM.  Vision and hearing grossly intact.  Residual bruise below her right eye. NECK: Supple.  No apparent JVD.  RESP:  No IWOB.  Fair aeration bilaterally. CVS:  RRR. Heart sounds normal.   ABD/GI/GU: BS+. Abd soft, NTND.  MSK/EXT:  Moves extremities. No apparent deformity. No edema.  SKIN: Residual skin bruise below her right eye. NEURO: Awake, alert and oriented appropriately.  No apparent focal neuro deficit. PSYCH: Calm. Normal affect.  Procedures:  None  Microbiology summarized: COVID-19 PCR nonreactive.  Assessment & Plan: Dizziness/vertigo-likely due to BPPV and possible concussion from fall.  Right Dix-Hallpike positive with torsional nystagmus lasting 10 to 15 seconds with a spinning sensation.  Orthostatic vitals negative.  No significant finding on EKG. CT head without contrast, CT cervical spine, carotid Doppler and MRI brain without acute or significant finding.  TSH elevated to 27.  Dizziness improving. -Continue vestibular PT -Fall precautions. -Transfer to SNF likely on 5/3  Fall at ALF likely due to vertigo. Skin laceration over right forehead -Fall precautions -Continue vestibular PT -Stitches removed on 4/30.  AKI on CKD-3B/azotemia: Resolving. Recent Labs    05/26/20 1506 05/27/20 0054 05/28/20 0039 05/30/20 0041 05/31/20 0818 08/03/20 1215 08/04/20 2237 08/05/20 0625 08/06/20 0529 08/08/20 0415  BUN 13 9 7* 10 8 18  30* 28* 21 23  CREATININE 0.90 0.76 0.89 1.08* 1.06* 1.25* 1.41* 1.33* 1.02* 1.05*  -Continue holding nephrotoxic meds  Permanent A. Fib: Rate controlled.  CHA2DS2-VASc score 6. -Continue home metoprolol and Cardizem -Low-dose Eliquis  History of PE-diagnosed in 02/2020.  Unclear if it is provoked or  unprovoked. -Low-dose Eliquis-  Chronic diastolic CHF: TTE in [id number] with LVEF of 60 to 65% and RVSP of 54.5 mmHg.  On p.o. Lasix 20 mg daily at home.  Appears euvolemic. -Continue holding Lasix -Monitor respiratory and fluid status  History of CAD s/p DES to LAD and dRCA no chest pain.  No acute ischemic finding on EKG. -Continue metoprolol tartrate and statin  Hypothyroidism: TSH elevated to 26.4.  27 on recheck.   (0.5 about 2 months ago).  Maybe not be getting her Synthroid at ALF. -Continue home Synthroid -Recheck TSH in 3 to 4 weeks  Urinary incontinence/recurrent UTI -Continue home trimethoprim and Myrbetriq  Underweight Body mass index is 18.3 kg/m.  -Consulted dietitian       DVT prophylaxis:  apixaban (ELIQUIS) tablet 2.5 mg Start: 08/05/20 2200 apixaban (ELIQUIS) tablet 2.5 mg  Code Status: DNR/DNI Family Communication: Updated patient's son, [name] over the phone Level of care: Telemetry Status is: Inpatient  Remains inpatient appropriate because:Unsafe d/c plan   Dispo: The patient is from: ALF              Anticipated d/c is to: SNF              Patient currently is medically stable to d/c.   Difficult to place patient No            Consultants:  None   Sch Meds:  Scheduled Meds: . apixaban  2.5 mg Oral BID  . atorvastatin  10 mg Oral QHS  . diltiazem  60 mg Oral TID  . levothyroxine  100 mcg Oral Daily  . metoprolol tartrate  100 mg Oral BID  . mirabegron ER  50 mg Oral QHS  . trimethoprim  100 mg Oral Daily  . vitamin B-12  500 mcg Oral Daily   Continuous Infusions: PRN Meds:.acetaminophen **OR** acetaminophen, hydrOXYzine, magnesium citrate, nitroGLYCERIN, polyethylene glycol, senna-docusate  Antimicrobials: Anti-infectives (From admission, onward)   Start     Dose/Rate Route Frequency Ordered Stop   08/05/20 1000  trimethoprim (TRIMPEX) tablet 100 mg        100 mg Oral Daily 08/05/20 0317         I have personally reviewed the following labs and images: CBC: Recent Labs  Lab 08/04/20 2237 08/05/20 0625 08/06/20 0529  WBC 8.2 8.4 8.0  NEUTROABS 4.4  --   --   HGB 11.9* 14.5 13.8  HCT 37.8 44.8 44.1  MCV 107.7* 106.9* 107.8*  PLT 173 210 214   BMP &GFR Recent Labs  Lab 08/04/20 2237 08/05/20 0625 08/06/20 0529 08/08/20 0415  NA 139 139 140 142  K 3.5 4.0 4.8 4.5  CL 101 100 104 107  CO2 29 28 27 27   GLUCOSE 107* 103* 84 92   BUN 30* 28* 21 23  CREATININE 1.41* 1.33* 1.02* 1.05*  CALCIUM 8.4* 9.2 9.3 9.4  MG  --   --  2.0 2.2  PHOS  --   --  3.2 3.9   Estimated Creatinine Clearance: 24.1 mL/min (A) (by C-G formula based on SCr of 1.05 mg/dL (H)). Liver & Pancreas: Recent Labs  Lab 08/06/20 0529 08/08/20 0415  ALBUMIN 3.1* 2.9*   No results for input(s): LIPASE, AMYLASE in the last 168 hours. No results for input(s): AMMONIA in the last 168 hours. Diabetic: No results for input(s): HGBA1C in the last 72 hours. No results for input(s): GLUCAP in the last 168 hours. Cardiac Enzymes: No results for input(s): CKTOTAL, CKMB, CKMBINDEX,  TROPONINI in the last 168 hours. No results for input(s): PROBNP in the last 8760 hours. Coagulation Profile: No results for input(s): INR, PROTIME in the last 168 hours. Thyroid Function Tests: No results for input(s): TSH, T4TOTAL, FREET4, T3FREE, THYROIDAB in the last 72 hours. Lipid Profile: No results for input(s): CHOL, HDL, LDLCALC, TRIG, CHOLHDL, LDLDIRECT in the last 72 hours. Anemia Panel: No results for input(s): VITAMINB12, FOLATE, FERRITIN, TIBC, IRON, RETICCTPCT in the last 72 hours. Urine analysis:    Component Value Date/Time   COLORURINE YELLOW 08/07/2020 [place] 08/07/2020 0614   LABSPEC 1.020 08/07/2020 0614   PHURINE 5.0 08/07/2020 0614   GLUCOSEU NEGATIVE 08/07/2020 0614   HGBUR NEGATIVE 08/07/2020 0614   BILIRUBINUR NEGATIVE 08/07/2020 0614   BILIRUBINUR negative 02/10/2020 [place] 08/07/2020 0614   PROTEINUR NEGATIVE 08/07/2020 0614   UROBILINOGEN 1.0 02/10/2020 1608   UROBILINOGEN 0.2 02/13/2012 0221   NITRITE NEGATIVE 08/07/2020 0614   LEUKOCYTESUR NEGATIVE 08/07/2020 0614   Sepsis Labs: Invalid input(s): PROCALCITONIN, [place]  Microbiology: Recent Results (from the past 240 hour(s))  SARS CORONAVIRUS 2 (TAT 6-24 HRS) Nasopharyngeal Nasopharyngeal Swab     Status: None   Collection Time:  08/05/20  1:11 AM   Specimen: Nasopharyngeal Swab  Result Value Ref Range Status   SARS Coronavirus 2 NEGATIVE NEGATIVE Final    Comment: (NOTE) SARS-CoV-2 target nucleic acids are NOT DETECTED.  The SARS-CoV-2 RNA is generally detectable in upper and lower respiratory specimens during the acute phase of infection. Negative results do not preclude SARS-CoV-2 infection, do not rule out co-infections with other pathogens, and should not be used as the sole basis for treatment or other patient management decisions. Negative results must be combined with clinical observations, patient history, and epidemiological information. The expected result is Negative.  Fact Sheet for Patients: SugarRoll.be  Fact Sheet for Healthcare Providers: https://www.woods-mathews.com/  This test is not yet approved or cleared by the [place] FDA and  has been authorized for detection and/or diagnosis of SARS-CoV-2 by FDA under an Emergency Use Authorization (EUA). This EUA will remain  in effect (meaning this test can be used) for the duration of the COVID-19 declaration under Se ction 564(b)(1) of the Act, 21 U.S.C. section 360bbb-3(b)(1), unless the authorization is terminated or revoked sooner.  Performed at [facility], [place] [address], [place], [place] [address]   MRSA PCR Screening     Status: None   Collection Time: 08/06/20  9:18 PM   Specimen: Nasal Mucosa; Nasopharyngeal  Result Value Ref Range Status   MRSA by PCR NEGATIVE NEGATIVE Final    Comment:        The GeneXpert MRSA Assay (FDA approved for NASAL specimens only), is one component of a comprehensive MRSA colonization surveillance program. It is not intended to diagnose MRSA infection nor to guide or monitor treatment for MRSA infections. Performed at [facility], [place] [address] [place], [place] [address]   Resp Panel by RT-PCR (Flu A&B, Covid)  Nasopharyngeal Swab     Status: None   Collection Time: 08/10/20 12:12 PM   Specimen: Nasopharyngeal Swab; Nasopharyngeal(NP) swabs in vial transport medium  Result Value Ref Range Status   SARS Coronavirus 2 by RT PCR NEGATIVE NEGATIVE Final    Comment: (NOTE) SARS-CoV-2 target nucleic acids are NOT DETECTED.  The SARS-CoV-2 RNA is generally detectable in upper respiratory specimens during the acute phase of infection. The lowest concentration of SARS-CoV-2 viral  copies this assay can detect is 138 copies/mL. A negative result does not preclude SARS-Cov-2 infection and should not be used as the sole basis for treatment or other patient management decisions. A negative result may occur with  improper specimen collection/handling, submission of specimen other than nasopharyngeal swab, presence of viral mutation(s) within the areas targeted by this assay, and inadequate number of viral copies(<138 copies/mL). A negative result must be combined with clinical observations, patient history, and epidemiological information. The expected result is Negative.  Fact Sheet for Patients:  EntrepreneurPulse.com.au  Fact Sheet for Healthcare Providers:  IncredibleEmployment.be  This test is no t yet approved or cleared by the [place] FDA and  has been authorized for detection and/or diagnosis of SARS-CoV-2 by FDA under an Emergency Use Authorization (EUA). This EUA will remain  in effect (meaning this test can be used) for the duration of the COVID-19 declaration under Section 564(b)(1) of the Act, 21 U.S.C.section 360bbb-3(b)(1), unless the authorization is terminated  or revoked sooner.       Influenza A by PCR NEGATIVE NEGATIVE Final   Influenza B by PCR NEGATIVE NEGATIVE Final    Comment: (NOTE) The Xpert Xpress SARS-CoV-2/FLU/RSV plus assay is intended as an aid in the diagnosis of influenza from Nasopharyngeal swab specimens and should not be  used as a sole basis for treatment. Nasal washings and aspirates are unacceptable for Xpert Xpress SARS-CoV-2/FLU/RSV testing.  Fact Sheet for Patients: EntrepreneurPulse.com.au  Fact Sheet for Healthcare Providers: IncredibleEmployment.be  This test is not yet approved or cleared by the [place] FDA and has been authorized for detection and/or diagnosis of SARS-CoV-2 by FDA under an Emergency Use Authorization (EUA). This EUA will remain in effect (meaning this test can be used) for the duration of the COVID-19 declaration under Section 564(b)(1) of the Act, 21 U.S.C. section 360bbb-3(b)(1), unless the authorization is terminated or revoked.  Performed at [facility], [place] [address] [place], [place] [address]     Radiology Studies: No results found.   Brittni Hult T. [place]  If 7PM-7AM, please contact night-coverage [email or web address] 08/10/2020, 2:05 PM

## 2020-08-11 DIAGNOSIS — I5033 Acute on chronic diastolic (congestive) heart failure: Secondary | ICD-10-CM | POA: Diagnosis not present

## 2020-08-11 DIAGNOSIS — M2550 Pain in unspecified joint: Secondary | ICD-10-CM | POA: Diagnosis not present

## 2020-08-11 DIAGNOSIS — R531 Weakness: Secondary | ICD-10-CM | POA: Diagnosis not present

## 2020-08-11 DIAGNOSIS — N179 Acute kidney failure, unspecified: Secondary | ICD-10-CM | POA: Diagnosis not present

## 2020-08-11 DIAGNOSIS — R2681 Unsteadiness on feet: Secondary | ICD-10-CM | POA: Diagnosis not present

## 2020-08-11 DIAGNOSIS — W19XXXA Unspecified fall, initial encounter: Secondary | ICD-10-CM

## 2020-08-11 DIAGNOSIS — M6281 Muscle weakness (generalized): Secondary | ICD-10-CM | POA: Diagnosis not present

## 2020-08-11 DIAGNOSIS — R2689 Other abnormalities of gait and mobility: Secondary | ICD-10-CM | POA: Diagnosis not present

## 2020-08-11 DIAGNOSIS — I5022 Chronic systolic (congestive) heart failure: Secondary | ICD-10-CM | POA: Diagnosis not present

## 2020-08-11 DIAGNOSIS — I2510 Atherosclerotic heart disease of native coronary artery without angina pectoris: Secondary | ICD-10-CM

## 2020-08-11 DIAGNOSIS — R0902 Hypoxemia: Secondary | ICD-10-CM | POA: Diagnosis not present

## 2020-08-11 DIAGNOSIS — R42 Dizziness and giddiness: Secondary | ICD-10-CM | POA: Diagnosis not present

## 2020-08-11 DIAGNOSIS — I2699 Other pulmonary embolism without acute cor pulmonale: Secondary | ICD-10-CM | POA: Diagnosis not present

## 2020-08-11 DIAGNOSIS — F4321 Adjustment disorder with depressed mood: Secondary | ICD-10-CM | POA: Diagnosis not present

## 2020-08-11 DIAGNOSIS — I480 Paroxysmal atrial fibrillation: Secondary | ICD-10-CM | POA: Diagnosis not present

## 2020-08-11 DIAGNOSIS — I959 Hypotension, unspecified: Secondary | ICD-10-CM | POA: Diagnosis not present

## 2020-08-11 DIAGNOSIS — R638 Other symptoms and signs concerning food and fluid intake: Secondary | ICD-10-CM | POA: Diagnosis not present

## 2020-08-11 DIAGNOSIS — R1312 Dysphagia, oropharyngeal phase: Secondary | ICD-10-CM | POA: Diagnosis not present

## 2020-08-11 DIAGNOSIS — R262 Difficulty in walking, not elsewhere classified: Secondary | ICD-10-CM | POA: Diagnosis not present

## 2020-08-11 DIAGNOSIS — H8113 Benign paroxysmal vertigo, bilateral: Secondary | ICD-10-CM | POA: Diagnosis not present

## 2020-08-11 DIAGNOSIS — E039 Hypothyroidism, unspecified: Secondary | ICD-10-CM | POA: Diagnosis not present

## 2020-08-11 DIAGNOSIS — J984 Other disorders of lung: Secondary | ICD-10-CM | POA: Diagnosis not present

## 2020-08-11 DIAGNOSIS — I4891 Unspecified atrial fibrillation: Secondary | ICD-10-CM | POA: Diagnosis not present

## 2020-08-11 DIAGNOSIS — N183 Chronic kidney disease, stage 3 unspecified: Secondary | ICD-10-CM | POA: Diagnosis not present

## 2020-08-11 DIAGNOSIS — R55 Syncope and collapse: Secondary | ICD-10-CM | POA: Diagnosis not present

## 2020-08-11 DIAGNOSIS — R41841 Cognitive communication deficit: Secondary | ICD-10-CM | POA: Diagnosis not present

## 2020-08-11 DIAGNOSIS — E43 Unspecified severe protein-calorie malnutrition: Secondary | ICD-10-CM | POA: Insufficient documentation

## 2020-08-11 DIAGNOSIS — Y92129 Unspecified place in nursing home as the place of occurrence of the external cause: Secondary | ICD-10-CM

## 2020-08-11 DIAGNOSIS — R4189 Other symptoms and signs involving cognitive functions and awareness: Secondary | ICD-10-CM | POA: Diagnosis not present

## 2020-08-11 DIAGNOSIS — H8110 Benign paroxysmal vertigo, unspecified ear: Secondary | ICD-10-CM | POA: Diagnosis not present

## 2020-08-11 DIAGNOSIS — R498 Other voice and resonance disorders: Secondary | ICD-10-CM | POA: Diagnosis not present

## 2020-08-11 DIAGNOSIS — R031 Nonspecific low blood-pressure reading: Secondary | ICD-10-CM | POA: Diagnosis not present

## 2020-08-11 DIAGNOSIS — R7989 Other specified abnormal findings of blood chemistry: Secondary | ICD-10-CM | POA: Diagnosis not present

## 2020-08-11 DIAGNOSIS — E441 Mild protein-calorie malnutrition: Secondary | ICD-10-CM | POA: Diagnosis not present

## 2020-08-11 DIAGNOSIS — I10 Essential (primary) hypertension: Secondary | ICD-10-CM | POA: Diagnosis not present

## 2020-08-11 DIAGNOSIS — I5032 Chronic diastolic (congestive) heart failure: Secondary | ICD-10-CM | POA: Diagnosis not present

## 2020-08-11 DIAGNOSIS — Z7401 Bed confinement status: Secondary | ICD-10-CM | POA: Diagnosis not present

## 2020-08-11 DIAGNOSIS — R296 Repeated falls: Secondary | ICD-10-CM | POA: Diagnosis not present

## 2020-08-11 MED ORDER — ENSURE ENLIVE PO LIQD: 237.0000 mL | Freq: Three times a day (TID) | ORAL | 12 refills | Status: AC

## 2020-08-11 MED ORDER — ADULT MULTIVITAMIN W/MINERALS CH
1.0000 | ORAL_TABLET | Freq: Every day | ORAL | Status: DC
  Administered 2020-08-11: 1 via ORAL
  Filled 2020-08-11: qty 1

## 2020-08-11 MED ORDER — ENSURE ENLIVE PO LIQD
237.0000 mL | Freq: Three times a day (TID) | ORAL | Status: DC
  Administered 2020-08-11 (×2): 237 mL via ORAL

## 2020-08-11 MED ORDER — FUROSEMIDE 20 MG PO TABS: 20.0000 mg | ORAL_TABLET | ORAL | Status: AC

## 2020-08-11 NOTE — Plan of Care (Signed)
  Problem: Clinical Measurements: ?Goal: Ability to maintain clinical measurements within normal limits will improve ?Outcome: Adequate for Discharge ?Goal: Will remain free from infection ?Outcome: Adequate for Discharge ?Goal: Diagnostic test results will improve ?Outcome: Adequate for Discharge ?Goal: Respiratory complications will improve ?Outcome: Adequate for Discharge ?Goal: Cardiovascular complication will be avoided ?Outcome: Adequate for Discharge ?  ?Problem: Activity: ?Goal: Risk for activity intolerance will decrease ?Outcome: Adequate for Discharge ?  ?Problem: Nutrition: ?Goal: Adequate nutrition will be maintained ?Outcome: Adequate for Discharge ?  ?

## 2020-08-11 NOTE — TOC Transition Note (Addendum)
Transition of Care [facility]) - CM/SW Discharge Note   Patient Details  Name: [name] MRN: [id number] Date of Birth: May 16, 1920  Transition of Care [facility]) CM/SW Contact:  Ross Ludwig, LCSW Phone Number: 08/11/2020, 11:18 AM   Clinical Narrative:     Patient to be d/c'ed today to [facility] SN[facility] room 208P.  Patient and family agreeable to plans will transport via ems RN to call report to [phone].  CSW updated patient's daughter in law Debroah Baller, that patient will be discharging today.  Bedside nurse please call daughter in law Debroah Baller once EMS arrives.     Final next level of care: Skilled Nursing Facility Barriers to Discharge: Barriers Resolved   Patient Goals and CMS Choice Patient states their goals for this hospitalization and ongoing recovery are:: To go to SNF for short term rehab, then return back home. CMS Medicare.gov Compare Post Acute Care list provided to:: Patient Represenative (must comment) Choice offered to / list presented to : Adult Children  Discharge Placement PASRR number recieved: 08/07/20            Patient chooses bed at: [facility] Patient to be transferred to facility by: [facility] Name of family member notified: Debroah Baller [phone] Patient and family notified of of transfer: 08/11/20  Discharge Plan and Services   Discharge Planning Services: CM Consult Post Acute Care Choice: Home Health                               Social Determinants of Health (SDOH) Interventions     Readmission Risk Interventions No flowsheet data found.

## 2020-08-11 NOTE — Discharge Summary (Addendum)
Physician Discharge Summary  [name] ENI:[id number] DOB: 1921-03-12 DOA: 08/04/2020  PCP: Holland Commons, FNP  Admit date: 08/04/2020 Discharge date: 08/11/2020  Admitted From: ALF Disposition: SNF  Recommendations for Outpatient Follow-up:  1. Follow ups as below. 2. Please obtain CBC/BMP/Mag at follow up 3. Repeat TSH in 3 weeks and adjust Synthroid as appropriate 4. Continue vestibular therapy 5. Please follow up on the following pending results: None   Discharge Condition: Stable CODE STATUS: DNR/DNI   Contact information for after-discharge care    Destination    [facility] .   Service: Skilled Nursing Contact information: [place] [facility] Course: 87 year old F with PMH of diastolic CHF, A. fib on Eliquis, CAD, PE and hypothyroidism return to ED with dizziness.  Patient was seen in ED due to forehead laceration after fall at ALF the day prior to admission and discharged back to ALF but continued to have dizziness and brought back to ED.  In ED, CTH without acute finding.  EKG with rate controlled A. Fib.  Hgb 11 (baseline~14).  Fecal Hemoccult negative.  She was admitted with working diagnosis of near syncope for observation.  Carotid Dopplers without significant finding.  MRI brain without acute finding.  Patient had positive right Dix-Hallpike with torsional nystagmus lasting about 10 to 15 seconds suggesting BPPV.    Patient has been transferred to SNF per therapy recommendation.   Discharge Diagnoses:  Dizziness/vertigo-likely due to BPPV and possible concussion from fall.  Right Dix-Hallpike positive with torsional nystagmus lasting 10 to 15 seconds with a spinning sensation.  Orthostatic vitals negative.  No significant finding on EKG. CT head without contrast, CT cervical spine, carotid Doppler and MRI brain without acute or significant finding.  TSH  elevated to 27.  Dizziness improving. -Discontinued hydroxyzine -Continue vestibular PT at SNF -Fall precautions.  Fall at ALF likely due to vertigo. Skin laceration over right forehead -Fall precautions -Continue vestibular PT -Stitches removed on 4/30.  AKI on CKD-3B/azotemia: Resolving. Recent Labs (within last 365 days)              Recent Labs    05/26/20 1506 05/27/20 0054 05/28/20 0039 05/30/20 0041 05/31/20 0818 08/03/20 1215 08/04/20 2237 08/05/20 0625 08/06/20 0529 08/08/20 0415  BUN 13 9 7* 10 8 18  30* 28* 21 23  CREATININE 0.90 0.76 0.89 1.08* 1.06* 1.25* 1.41* 1.33* 1.02* 1.05*    -Recheck renal function in 1 week  Permanent A. Fib: Rate controlled.  CHA2DS2-VASc score 6. -Continue home metoprolol and Cardizem -Continue low-dose Eliquis  History of PE-diagnosed in 02/2020.  Unclear if it is provoked or unprovoked. -Low-dose Eliquis as above  Chronic diastolic CHF: TTE in 61/4431 with LVEF of 60 to 65% and RVSP of 54.5 mmHg.  On p.o. Lasix 20 mg daily at home.  Appears euvolemic without Lasix here.  No respiratory issues. -P.o. Lasix 20 mg Monday, Wednesday and Friday -Monitor respiratory and fluid status  History of CAD s/p DES to LAD and dRCA no chest pain.  No acute ischemic finding on EKG. -Continue metoprolol tartrate and statin  Hypothyroidism: TSH elevated to 26.4.  27 on recheck.  (0.5 about 2 months ago).  Maybe not be getting her Synthroid at ALF. -Continue home Synthroid -Recheck TSH in 3 weeks and adjust Synthroid as appropriate  Urinary incontinence/recurrent UTI -Continue home  trimethoprim and Myrbetriq  Underweight Body mass index is 18.3 kg/m. Nutrition Problem: Severe Malnutrition Etiology: chronic illness-Continue multivitamin and nutritional supplements Signs/Symptoms: percent weight loss,severe fat depletion,moderate muscle depletion,severe muscle depletion Percent weight loss: 12.5 % Interventions: Ensure Enlive  (each supplement provides 350kcal and 20 grams of protein),MVI,Liberalize Diet      Discharge Exam: Vitals:   08/11/20 0535 08/11/20 0940  BP: 110/75 123/76  Pulse: 69 97  Resp: 18   Temp: 98.4 F (36.9 C)   SpO2: 97% 97%    GENERAL: No apparent distress.  Nontoxic. HEENT: MMM.  Vision and hearing grossly intact.  Bruising below right eye clearing. NECK: Supple.  No apparent JVD.  RESP:  No IWOB.  Fair aeration bilaterally. CVS:  RRR. Heart sounds normal.  ABD/GI/GU: Bowel sounds present. Soft. Non tender.  MSK/EXT:  Moves extremities. No apparent deformity. No edema.  SKIN: Bruising below right eye clearing. NEURO: Awake, alert and oriented appropriately.  No apparent focal neuro deficit but generalized weakness. PSYCH: Calm. Normal affect.  Discharge Instructions  Discharge Instructions    Call MD for:  persistant dizziness or light-headedness   Complete by: As directed    Call MD for:  severe uncontrolled pain   Complete by: As directed    Diet regular   Complete by: As directed    Discharge instructions   Complete by: As directed    It has been a pleasure taking care of you!  You were hospitalized due to dizziness and fall.  After the studies we have done, we believe your symptoms are from BPPV and possibly concussion from fall.  The treatment for BPPV is vestibular therapy.  We also noted that your thyroid level is very low.  Please make sure that you are getting your thyroid medication daily.    Take care,   Increase activity slowly   Complete by: As directed      Allergies as of 08/11/2020   No Known Allergies     Medication List    STOP taking these medications   hydrOXYzine 25 MG tablet Commonly known as: ATARAX/VISTARIL     TAKE these medications   acetaminophen 500 MG tablet Commonly known as: TYLENOL Take 1,000 mg by mouth every 8 (eight) hours as needed (for pain).   apixaban 2.5 MG Tabs tablet Commonly known as: ELIQUIS Take 1 tablet (2.5  mg total) by mouth 2 (two) times daily. What changed:   medication strength  how much to take   atorvastatin 10 MG tablet Commonly known as: LIPITOR Take 1 tablet (10 mg total) by mouth daily.   diltiazem 60 MG tablet Commonly known as: CARDIZEM Take 1 tablet (60 mg total) by mouth 3 (three) times daily.   feeding supplement Liqd Take 237 mLs by mouth 3 (three) times daily between meals.   furosemide 20 MG tablet Commonly known as: LASIX Take 1 tablet (20 mg total) by mouth every Monday, Wednesday, and Friday. Start taking on: Aug 12, 2020 What changed: when to take this   levothyroxine 100 MCG tablet Commonly known as: SYNTHROID Take 1 tablet by mouth daily.   magnesium citrate Soln Take 296 mLs (1 Bottle total) by mouth daily as needed for severe constipation.   metoprolol tartrate 100 MG tablet Commonly known as: LOPRESSOR Take 100 mg by mouth 2 (two) times daily.   Myrbetriq 50 MG Tb24 tablet Generic drug: mirabegron ER Take 50 mg by mouth every evening.   nitroGLYCERIN 0.4 MG SL tablet Commonly known  as: NITROSTAT Place 0.4 mg under the tongue every 5 (five) minutes as needed. For chest pain   ONE-A-DAY PROACTIVE 65+ PO Take 1 tablet by mouth at bedtime.   polyethylene glycol 17 g packet Commonly known as: MIRALAX / GLYCOLAX Take 17 g by mouth 2 (two) times daily as needed for mild constipation.   senna-docusate 8.6-50 MG tablet Commonly known as: Senokot-S Take 1 tablet by mouth 2 (two) times daily as needed for moderate constipation.   trimethoprim 100 MG tablet Commonly known as: TRIMPEX Take 100 mg by mouth in the morning.   vitamin B-12 500 MCG tablet Commonly known as: CYANOCOBALAMIN Take 500 mcg by mouth daily.       Consultations:  None  Procedures/Studies:   DG Sacrum/Coccyx  Result Date: 08/03/2020 CLINICAL DATA:  87 year old post fall. Fall off toilet this morning. EXAM: SACRUM AND COCCYX - 2+ VIEW COMPARISON:  Reformats from  abdominopelvic CT 05/26/2020 FINDINGS: There is no evidence of fracture or other focal bone lesions. Cortical margins of the sacrum and coccyx appear intact. Bones are diffusely under mineralized. Sacroiliac joints are congruent. No sacral alar disruption. IMPRESSION: No evidence of sacral or coccygeal fracture. Electronically Signed   By: Keith Rake M.D.   On: 08/03/2020 15:01   DG Wrist Complete Right  Result Date: 08/07/2020 CLINICAL DATA:  Fall at home, pain EXAM: RIGHT WRIST - COMPLETE 3+ VIEW COMPARISON:  None. FINDINGS: Alignment is anatomic. Osseous demineralization. No acute fracture. Mild degenerative changes are present at the wrist. More pronounced degenerative changes are present at the visualized interphalangeal joints. Vascular calcifications. IMPRESSION: No acute fracture. Electronically Signed   By: Macy Mis M.D.   On: 08/07/2020 11:28   CT Head Wo Contrast  Result Date: 08/05/2020 CLINICAL DATA:  Dizziness.  Suspected head trauma. EXAM: CT HEAD WITHOUT CONTRAST TECHNIQUE: Contiguous axial images were obtained from the base of the skull through the vertex without intravenous contrast. COMPARISON:  MRI brain 05/27/2020.  CT head 05/26/2020 FINDINGS: Brain: No evidence of acute infarction, hemorrhage, hydrocephalus, extra-axial collection or mass lesion/mass effect. Diffuse cerebral atrophy. Ventricular dilatation likely due to central atrophy. Low-attenuation changes in the deep white matter likely due to small vessel ischemia. Vascular: Prominent intracranial arterial vascular calcifications. Skull: Calvarium appears intact. Sinuses/Orbits: Paranasal sinuses and mastoid air cells are clear. Other: None. IMPRESSION: 1. No acute intracranial abnormalities. 2. Chronic atrophy and small vessel ischemic changes. Electronically Signed   By: Lucienne Capers M.D.   On: 08/05/2020 01:03   CT Head Wo Contrast  Result Date: 08/03/2020 CLINICAL DATA:  Status post fall.  Initial  encounter. EXAM: CT HEAD WITHOUT CONTRAST CT CERVICAL SPINE WITHOUT CONTRAST TECHNIQUE: Multidetector CT imaging of the head and cervical spine was performed following the standard protocol without intravenous contrast. Multiplanar CT image reconstructions of the cervical spine were also generated. COMPARISON:  None. FINDINGS: CT HEAD FINDINGS Brain: No evidence of acute infarction, hemorrhage, hydrocephalus, extra-axial collection or mass lesion/mass effect. Atrophy and chronic microvascular ischemic change noted. Vascular: No hyperdense vessel or unexpected calcification. Skull: Intact.  No focal lesion. Sinuses/Orbits: Mild mucosal thickening right sphenoid sinus. Very small mucous retention cyst or polyp left maxillary sinus. Other: None. CT CERVICAL SPINE FINDINGS Alignment: Mild reversal of the normal cervical lordosis. Trace anterolisthesis C4 on C5 and C7 on T1 is due to facet degenerative disease. Skull base and vertebrae: No acute fracture. No primary bone lesion or focal pathologic process. Failure fusion of the posterior arch of C1 incidentally  noted. Soft tissues and spinal canal: Negative. Disc levels:  Marked multilevel loss of disc space height. Upper chest: Calcified granuloma left upper lobe noted. Other: None. IMPRESSION: No acute abnormality head or cervical spine. Atrophy and chronic microvascular ischemic change. Multilevel cervical spondylosis. Mild sinus disease. Electronically Signed   By: Inge Rise M.D.   On: 08/03/2020 14:13   CT Cervical Spine Wo Contrast  Result Date: 08/03/2020 CLINICAL DATA:  Status post fall.  Initial encounter. EXAM: CT HEAD WITHOUT CONTRAST CT CERVICAL SPINE WITHOUT CONTRAST TECHNIQUE: Multidetector CT imaging of the head and cervical spine was performed following the standard protocol without intravenous contrast. Multiplanar CT image reconstructions of the cervical spine were also generated. COMPARISON:  None. FINDINGS: CT HEAD FINDINGS Brain: No  evidence of acute infarction, hemorrhage, hydrocephalus, extra-axial collection or mass lesion/mass effect. Atrophy and chronic microvascular ischemic change noted. Vascular: No hyperdense vessel or unexpected calcification. Skull: Intact.  No focal lesion. Sinuses/Orbits: Mild mucosal thickening right sphenoid sinus. Very small mucous retention cyst or polyp left maxillary sinus. Other: None. CT CERVICAL SPINE FINDINGS Alignment: Mild reversal of the normal cervical lordosis. Trace anterolisthesis C4 on C5 and C7 on T1 is due to facet degenerative disease. Skull base and vertebrae: No acute fracture. No primary bone lesion or focal pathologic process. Failure fusion of the posterior arch of C1 incidentally noted. Soft tissues and spinal canal: Negative. Disc levels:  Marked multilevel loss of disc space height. Upper chest: Calcified granuloma left upper lobe noted. Other: None. IMPRESSION: No acute abnormality head or cervical spine. Atrophy and chronic microvascular ischemic change. Multilevel cervical spondylosis. Mild sinus disease. Electronically Signed   By: Inge Rise M.D.   On: 08/03/2020 14:13   MR BRAIN WO CONTRAST  Result Date: 08/06/2020 CLINICAL DATA:  Near syncope EXAM: MRI HEAD WITHOUT CONTRAST TECHNIQUE: Multiplanar, multiecho pulse sequences of the brain and surrounding structures were obtained without intravenous contrast. COMPARISON:  05/27/2020 FINDINGS: Brain: There is no acute infarction or intracranial hemorrhage. There is no intracranial mass, mass effect, or edema. There is no hydrocephalus or extra-axial fluid collection. Prominence of the ventricles and sulci reflects generalized parenchymal volume loss. Patchy and confluent areas of T2 hyperintensity in the supratentorial white matter are nonspecific but probably reflect moderate to advanced chronic microvascular ischemic changes. There are chronic small vessel infarcts of the bilateral basal ganglia bilateral thalami, left  thalamus, and bilateral cerebellar hemispheres. Punctate focus of susceptibility hypointensity in the left frontal subcortical white matter is most compatible with a chronic microhemorrhage. Vascular: Major vessel flow voids at the skull base are preserved. Skull and upper cervical spine: Normal marrow signal is preserved. Sinuses/Orbits: Paranasal sinuses are aerated. Orbits are unremarkable. Other: Sella is unremarkable.  Mastoid air cells are clear. IMPRESSION: No evidence of recent infarction, hemorrhage, or mass. No significant change since 05/27/2020 with nonemergent findings detailed above. Electronically Signed   By: Macy Mis M.D.   On: 08/06/2020 14:29   VAS US CAROTID  Result Date: 08/06/2020 Carotid Arterial Duplex Study Patient Name:  FAHMIDA MERGEL [facility]  Date of Exam:   08/06/2020 Medical Rec #: [id number]       Accession #:    [id number] Date of Birth: Dec 11, 1920       Patient Gender: F Patient Age:   89Y Exam Location:  [facility] Procedure:      VAS US CAROTID Referring Phys: [id number] Charlesetta Ivory Trigo Winter[place] --------------------------------------------------------------------------------  Indications:       Syncope. Risk Factors:  Hypertension, hyperlipidemia. Comparison Study:  No prior studies. Performing Technologist: Oliver Hum RVT  Examination Guidelines: A complete evaluation includes B-mode imaging, spectral Doppler, color Doppler, and power Doppler as needed of all accessible portions of each vessel. Bilateral testing is considered an integral part of a complete examination. Limited examinations for reoccurring indications may be performed as noted.  Right Carotid Findings: +----------+--------+--------+--------+-----------------------+--------+           PSV cm/sEDV cm/sStenosisPlaque Description     Comments +----------+--------+--------+--------+-----------------------+--------+ CCA Prox  50      12              smooth and heterogenoustortuous  +----------+--------+--------+--------+-----------------------+--------+ CCA Distal62      15              smooth and heterogenous         +----------+--------+--------+--------+-----------------------+--------+ ICA Prox  71      17              calcific                        +----------+--------+--------+--------+-----------------------+--------+ ICA Distal72      27                                     tortuous +----------+--------+--------+--------+-----------------------+--------+ ECA       67      6                                               +----------+--------+--------+--------+-----------------------+--------+ +----------+--------+-------+--------+-------------------+           PSV cm/sEDV cmsDescribeArm Pressure (mmHG) +----------+--------+-------+--------+-------------------+ [id number]                                         +----------+--------+-------+--------+-------------------+ +---------+--------+--+--------+-+---------+ VertebralPSV cm/s36EDV cm/s8Antegrade +---------+--------+--+--------+-+---------+  Left Carotid Findings: +----------+--------+--------+--------+-----------------------+--------+           PSV cm/sEDV cm/sStenosisPlaque Description     Comments +----------+--------+--------+--------+-----------------------+--------+ CCA Prox  54      10              smooth and heterogenous         +----------+--------+--------+--------+-----------------------+--------+ CCA Distal49      6               smooth and heterogenous         +----------+--------+--------+--------+-----------------------+--------+ ICA Prox  60      13              calcific                        +----------+--------+--------+--------+-----------------------+--------+ ICA Distal102     22                                     tortuous +----------+--------+--------+--------+-----------------------+--------+ ECA       79      7                                                +----------+--------+--------+--------+-----------------------+--------+ +----------+--------+--------+--------+-------------------+  PSV cm/sEDV cm/sDescribeArm Pressure (mmHG) +----------+--------+--------+--------+-------------------+ Subclavian70                                          +----------+--------+--------+--------+-------------------+ +---------+--------+--+--------+-+---------+ VertebralPSV cm/s22EDV cm/s5Antegrade +---------+--------+--+--------+-+---------+   Summary: Right Carotid: Velocities in the right ICA are consistent with a 1-39% stenosis. Left Carotid: Velocities in the left ICA are consistent with a 1-39% stenosis. Vertebrals: Bilateral vertebral arteries demonstrate antegrade flow. *See table(s) above for measurements and observations.  Electronically signed by Jamelle Haring on 08/06/2020 at 3:41:21 PM.    Final        The results of significant diagnostics from this hospitalization (including imaging, microbiology, ancillary and laboratory) are listed below for reference.     Microbiology: Recent Results (from the past 240 hour(s))  SARS CORONAVIRUS 2 (TAT 6-24 HRS) Nasopharyngeal Nasopharyngeal Swab     Status: None   Collection Time: 08/05/20  1:11 AM   Specimen: Nasopharyngeal Swab  Result Value Ref Range Status   SARS Coronavirus 2 NEGATIVE NEGATIVE Final    Comment: (NOTE) SARS-CoV-2 target nucleic acids are NOT DETECTED.  The SARS-CoV-2 RNA is generally detectable in upper and lower respiratory specimens during the acute phase of infection. Negative results do not preclude SARS-CoV-2 infection, do not rule out co-infections with other pathogens, and should not be used as the sole basis for treatment or other patient management decisions. Negative results must be combined with clinical observations, patient history, and epidemiological information. The expected result is Negative.  Fact  Sheet for Patients: SugarRoll.be  Fact Sheet for Healthcare Providers: https://www.woods-mathews.com/  This test is not yet approved or cleared by the [place] FDA and  has been authorized for detection and/or diagnosis of SARS-CoV-2 by FDA under an Emergency Use Authorization (EUA). This EUA will remain  in effect (meaning this test can be used) for the duration of the COVID-19 declaration under Se ction 564(b)(1) of the Act, 21 U.S.C. section 360bbb-3(b)(1), unless the authorization is terminated or revoked sooner.  Performed at [facility], [place] [address] [place], [place] [address]   MRSA PCR Screening     Status: None   Collection Time: 08/06/20  9:18 PM   Specimen: Nasal Mucosa; Nasopharyngeal  Result Value Ref Range Status   MRSA by PCR NEGATIVE NEGATIVE Final    Comment:        The GeneXpert MRSA Assay (FDA approved for NASAL specimens only), is one component of a comprehensive MRSA colonization surveillance program. It is not intended to diagnose MRSA infection nor to guide or monitor treatment for MRSA infections. Performed at [facility], [place] [address] [place], [place] [address]   Resp Panel by RT-PCR (Flu A&B, Covid) Nasopharyngeal Swab     Status: None   Collection Time: 08/10/20 12:12 PM   Specimen: Nasopharyngeal Swab; Nasopharyngeal(NP) swabs in vial transport medium  Result Value Ref Range Status   SARS Coronavirus 2 by RT PCR NEGATIVE NEGATIVE Final    Comment: (NOTE) SARS-CoV-2 target nucleic acids are NOT DETECTED.  The SARS-CoV-2 RNA is generally detectable in upper respiratory specimens during the acute phase of infection. The lowest concentration of SARS-CoV-2 viral copies this assay can detect is 138 copies/mL. A negative result does not preclude SARS-Cov-2 infection and should not be used as the sole basis for treatment or other patient management decisions. A negative  result may occur with  improper specimen collection/handling, submission of specimen other than nasopharyngeal swab, presence of viral mutation(s) within the areas targeted by this assay, and inadequate number of viral copies(<138 copies/mL). A negative result must be combined with clinical observations, patient history, and epidemiological information. The expected result is Negative.  Fact Sheet for Patients:  EntrepreneurPulse.com.au  Fact Sheet for Healthcare Providers:  IncredibleEmployment.be  This test is no t yet approved or cleared by the [place] FDA and  has been authorized for detection and/or diagnosis of SARS-CoV-2 by FDA under an Emergency Use Authorization (EUA). This EUA will remain  in effect (meaning this test can be used) for the duration of the COVID-19 declaration under Section 564(b)(1) of the Act, 21 U.S.C.section 360bbb-3(b)(1), unless the authorization is terminated  or revoked sooner.       Influenza A by PCR NEGATIVE NEGATIVE Final   Influenza B by PCR NEGATIVE NEGATIVE Final    Comment: (NOTE) The Xpert Xpress SARS-CoV-2/FLU/RSV plus assay is intended as an aid in the diagnosis of influenza from Nasopharyngeal swab specimens and should not be used as a sole basis for treatment. Nasal washings and aspirates are unacceptable for Xpert Xpress SARS-CoV-2/FLU/RSV testing.  Fact Sheet for Patients: EntrepreneurPulse.com.au  Fact Sheet for Healthcare Providers: IncredibleEmployment.be  This test is not yet approved or cleared by the [place] FDA and has been authorized for detection and/or diagnosis of SARS-CoV-2 by FDA under an Emergency Use Authorization (EUA). This EUA will remain in effect (meaning this test can be used) for the duration of the COVID-19 declaration under Section 564(b)(1) of the Act, 21 U.S.C. section 360bbb-3(b)(1), unless the authorization is  terminated or revoked.  Performed at [facility], [place] [address], [place], [place] [address]      Labs:  CBC: Recent Labs  Lab 08/04/20 2237 08/05/20 0625 08/06/20 0529  WBC 8.2 8.4 8.0  NEUTROABS 4.4  --   --   HGB 11.9* 14.5 13.8  HCT 37.8 44.8 44.1  MCV 107.7* 106.9* 107.8*  PLT 173 210 214   BMP &GFR Recent Labs  Lab 08/04/20 2237 08/05/20 0625 08/06/20 0529 08/08/20 0415  NA 139 139 140 142  K 3.5 4.0 4.8 4.5  CL 101 100 104 107  CO2 29 28 27 27   GLUCOSE 107* 103* 84 92  BUN 30* 28* 21 23  CREATININE 1.41* 1.33* 1.02* 1.05*  CALCIUM 8.4* 9.2 9.3 9.4  MG  --   --  2.0 2.2  PHOS  --   --  3.2 3.9   Estimated Creatinine Clearance: 24.1 mL/min (A) (by C-G formula based on SCr of 1.05 mg/dL (H)). Liver & Pancreas: Recent Labs  Lab 08/06/20 0529 08/08/20 0415  ALBUMIN 3.1* 2.9*   No results for input(s): LIPASE, AMYLASE in the last 168 hours. No results for input(s): AMMONIA in the last 168 hours. Diabetic: No results for input(s): HGBA1C in the last 72 hours. No results for input(s): GLUCAP in the last 168 hours. Cardiac Enzymes: No results for input(s): CKTOTAL, CKMB, CKMBINDEX, TROPONINI in the last 168 hours. No results for input(s): PROBNP in the last 8760 hours. Coagulation Profile: No results for input(s): INR, PROTIME in the last 168 hours. Thyroid Function Tests: No results for input(s): TSH, T4TOTAL, FREET4, T3FREE, THYROIDAB in the last 72 hours. Lipid Profile: No results for input(s): CHOL, HDL, LDLCALC, TRIG, CHOLHDL, LDLDIRECT in the last 72 hours. Anemia Panel: No results for input(s): VITAMINB12, FOLATE, FERRITIN, TIBC, IRON, RETICCTPCT in the last 72  hours. Urine analysis:    Component Value Date/Time   COLORURINE YELLOW 08/07/2020 [place] 08/07/2020 0614   LABSPEC 1.020 08/07/2020 0614   PHURINE 5.0 08/07/2020 0614   GLUCOSEU NEGATIVE 08/07/2020 0614   HGBUR NEGATIVE 08/07/2020 0614    BILIRUBINUR NEGATIVE 08/07/2020 0614   BILIRUBINUR negative 02/10/2020 [place] 08/07/2020 0614   PROTEINUR NEGATIVE 08/07/2020 0614   UROBILINOGEN 1.0 02/10/2020 1608   UROBILINOGEN 0.2 02/13/2012 0221   NITRITE NEGATIVE 08/07/2020 0614   LEUKOCYTESUR NEGATIVE 08/07/2020 0614   Sepsis Labs: Invalid input(s): PROCALCITONIN, LACTICIDVEN   Time coordinating discharge: 40 minutes  SIGNED:  Mercy Riding, MD  [facility] 08/11/2020, 11:24 AM  If 7PM-7AM, please contact night-coverage [email or web address]

## 2020-08-11 NOTE — Progress Notes (Signed)
Initial Nutrition Assessment  DOCUMENTATION CODES:  Severe malnutrition in context of chronic illness  INTERVENTION:  Recommend liberalizing diet to regular.  Add Ensure Enlive po TID, each supplement provides 350 kcal and 20 grams of protein.  Add MVI with minerals daily.  NUTRITION DIAGNOSIS:  Severe Malnutrition related to chronic illness as evidenced by percent weight loss,severe fat depletion,moderate muscle depletion,severe muscle depletion.  GOAL:  Patient will meet greater than or equal to 90% of their needs  MONITOR:  PO intake,Supplement acceptance,Labs,Weight trends,I & O's  REASON FOR ASSESSMENT:  Consult Other (Comment) (Underweight)  ASSESSMENT:  87 yo female with a PMH of HTN, hypothyroidism, diverticulosis, IBS, HLD, and GERD who presents with near syncope.   Pt likely discharging to SNF today.  Spoke with pt at bedside. Pt reports that she thinks she eats well at home, but she was unable to recall what she eats regularly. She reports no changes in her appetite recently.  She does report weight loss. She states she used to weigh 130 lbs, but does not remember when that was. She reports being 110 lbs now, which is what is noted in Epic. Per Epic, pt has lost about 16 lbs (12.5% of her BW) in approximately 2.5 months, which is significant and severe.  On exam, pt has severe loss in both muscle and fat stores. Pt is severely malnourished.  Recommend Ensure TID to promote repletion in both fat and muscle stores. Also recommend MVI with minerals daily and liberalizing diet to regular given pt's age - secure chatted MD regarding this.  Medications: Synthroid, Vitamin B12 500 mcg Labs: reviewed  NUTRITION - FOCUSED PHYSICAL EXAM: Flowsheet Row Most Recent Value  Orbital Region Severe depletion  Upper Arm Region Severe depletion  Thoracic and Lumbar Region Severe depletion  Buccal Region Severe depletion  Temple Region Severe depletion  Clavicle Bone Region  Severe depletion  Clavicle and Acromion Bone Region Severe depletion  Scapular Bone Region Severe depletion  Dorsal Hand Severe depletion  Patellar Region Moderate depletion  Anterior Thigh Region Moderate depletion  Posterior Calf Region Moderate depletion  Edema (RD Assessment) None  Hair Reviewed  Eyes Reviewed  Mouth Reviewed  Skin Reviewed  Nails Reviewed     Diet Order:   Diet Order            Diet regular           Diet Heart Room service appropriate? Yes; Fluid consistency: Thin  Diet effective now                EDUCATION NEEDS:  Education needs have been addressed  Skin:  Skin Assessment: Reviewed RN Assessment  Last BM:  08/09/20 - Type 2, large amount  Height:  Ht Readings from Last 1 Encounters:  08/04/20 5\' 5"  (1.651 m)   Weight:  Wt Readings from Last 1 Encounters:  08/04/20 49.9 kg   Ideal Body Weight:  56.8 kg  BMI:  Body mass index is 18.3 kg/m.  Estimated Nutritional Needs:  Kcal:  1400-1600 Protein:  60-75 grams Fluid:  >1.4 L  Derrel Nip, RD, LDN Registered Dietitian After Hours/Weekend Pager # in [place]

## 2020-08-11 NOTE — Progress Notes (Signed)
[facility] and gave report to Nurse Fatmata.

## 2020-08-11 NOTE — TOC Progression Note (Signed)
Transition of Care [facility]) - Progression Note    Patient Details  Name: [name] MRN: [id number] Date of Birth: 02-17-1921  Transition of Care [facility] CM/SW Contact  Ross Ludwig,  Phone Number: 08/11/2020, 10:07 AM  Clinical Narrative:     CSW spoke to [facility], they can accept patient after 12pm today.  CSW notified attending physician.   Expected Discharge Plan: [place] Barriers to Discharge: Continued Medical Work up  Expected Discharge Plan and Services Expected Discharge Plan: [place]   Discharge Planning Services: CM Consult Post Acute Care Choice: [place] arrangements for the past 2 months: [place] Expected Discharge Date: 08/11/20                                     Social Determinants of Health (SDOH) Interventions    Readmission Risk Interventions No flowsheet data found.

## 2020-08-11 NOTE — Discharge Instructions (Signed)
Nutrition Post Hospital Stay Proper nutrition can help your body recover from illness and injury.   Foods and beverages high in protein, vitamins, and minerals help rebuild muscle loss, promote healing, & reduce fall risk.   .In addition to eating healthy foods, a nutrition shake is an easy, delicious way to get the nutrition you need during and after your hospital stay  It is recommended that you continue to drink 2-3 bottles per day of:       Ensure for at least 1 month (30 days) after your hospital stay   Tips for adding a nutrition shake into your routine: As allowed, drink one with vitamins or medications instead of water or juice Enjoy one as a tasty mid-morning or afternoon snack Drink cold or make a milkshake out of it Drink one instead of milk with cereal or snacks Use as a coffee creamer   Available at the following grocery stores and pharmacies:           * [employer] * [employer] * [employer]co  * Rite Aid          * Walmart * [employer]  * Walgreens      * Target  * BJ's   * CVS  * [employer]   * [facility] [phone]            For COUPONS visit: [email or web address]/join or [email or web address]   Suggested Substitutions Ensure Plus = Boost Plus = Carnation Breakfast Essentials = Boost Compact Ensure Active Clear = Boost Breeze Glucerna Shake = Boost Glucose Control = Carnation Breakfast Essentials SUGAR FREE     

## 2020-08-13 DIAGNOSIS — M6281 Muscle weakness (generalized): Secondary | ICD-10-CM | POA: Diagnosis not present

## 2020-08-13 DIAGNOSIS — H8113 Benign paroxysmal vertigo, bilateral: Secondary | ICD-10-CM | POA: Diagnosis not present

## 2020-08-13 DIAGNOSIS — H8110 Benign paroxysmal vertigo, unspecified ear: Secondary | ICD-10-CM | POA: Diagnosis not present

## 2020-08-13 DIAGNOSIS — R2681 Unsteadiness on feet: Secondary | ICD-10-CM | POA: Diagnosis not present

## 2020-08-13 DIAGNOSIS — I2699 Other pulmonary embolism without acute cor pulmonale: Secondary | ICD-10-CM | POA: Diagnosis not present

## 2020-08-13 DIAGNOSIS — I4891 Unspecified atrial fibrillation: Secondary | ICD-10-CM | POA: Diagnosis not present

## 2020-08-13 DIAGNOSIS — R296 Repeated falls: Secondary | ICD-10-CM | POA: Diagnosis not present

## 2020-08-13 DIAGNOSIS — I10 Essential (primary) hypertension: Secondary | ICD-10-CM | POA: Diagnosis not present

## 2020-08-13 DIAGNOSIS — N179 Acute kidney failure, unspecified: Secondary | ICD-10-CM | POA: Diagnosis not present

## 2020-08-13 DIAGNOSIS — N183 Chronic kidney disease, stage 3 unspecified: Secondary | ICD-10-CM | POA: Diagnosis not present

## 2020-08-13 DIAGNOSIS — R262 Difficulty in walking, not elsewhere classified: Secondary | ICD-10-CM | POA: Diagnosis not present

## 2020-08-13 DIAGNOSIS — R55 Syncope and collapse: Secondary | ICD-10-CM | POA: Diagnosis not present

## 2020-08-13 DIAGNOSIS — I5032 Chronic diastolic (congestive) heart failure: Secondary | ICD-10-CM | POA: Diagnosis not present

## 2020-08-13 DIAGNOSIS — I2510 Atherosclerotic heart disease of native coronary artery without angina pectoris: Secondary | ICD-10-CM | POA: Diagnosis not present

## 2020-08-13 DIAGNOSIS — J984 Other disorders of lung: Secondary | ICD-10-CM | POA: Diagnosis not present

## 2020-08-13 DIAGNOSIS — E039 Hypothyroidism, unspecified: Secondary | ICD-10-CM | POA: Diagnosis not present

## 2020-08-19 DIAGNOSIS — E441 Mild protein-calorie malnutrition: Secondary | ICD-10-CM | POA: Diagnosis not present

## 2020-08-19 DIAGNOSIS — H8110 Benign paroxysmal vertigo, unspecified ear: Secondary | ICD-10-CM | POA: Diagnosis not present

## 2020-08-19 DIAGNOSIS — I480 Paroxysmal atrial fibrillation: Secondary | ICD-10-CM | POA: Diagnosis not present

## 2020-08-19 DIAGNOSIS — R031 Nonspecific low blood-pressure reading: Secondary | ICD-10-CM | POA: Diagnosis not present

## 2020-08-19 DIAGNOSIS — I5033 Acute on chronic diastolic (congestive) heart failure: Secondary | ICD-10-CM | POA: Diagnosis not present

## 2020-08-21 DIAGNOSIS — I4891 Unspecified atrial fibrillation: Secondary | ICD-10-CM | POA: Diagnosis not present

## 2020-08-21 DIAGNOSIS — H8110 Benign paroxysmal vertigo, unspecified ear: Secondary | ICD-10-CM | POA: Diagnosis not present

## 2020-08-21 DIAGNOSIS — I5033 Acute on chronic diastolic (congestive) heart failure: Secondary | ICD-10-CM | POA: Diagnosis not present

## 2020-08-21 DIAGNOSIS — R0902 Hypoxemia: Secondary | ICD-10-CM | POA: Diagnosis not present

## 2020-08-24 DIAGNOSIS — I4891 Unspecified atrial fibrillation: Secondary | ICD-10-CM | POA: Diagnosis not present

## 2020-08-24 DIAGNOSIS — I5032 Chronic diastolic (congestive) heart failure: Secondary | ICD-10-CM | POA: Diagnosis not present

## 2020-08-24 DIAGNOSIS — I2699 Other pulmonary embolism without acute cor pulmonale: Secondary | ICD-10-CM | POA: Diagnosis not present

## 2020-08-24 DIAGNOSIS — I10 Essential (primary) hypertension: Secondary | ICD-10-CM | POA: Diagnosis not present

## 2020-08-24 DIAGNOSIS — R2681 Unsteadiness on feet: Secondary | ICD-10-CM | POA: Diagnosis not present

## 2020-08-24 DIAGNOSIS — I2510 Atherosclerotic heart disease of native coronary artery without angina pectoris: Secondary | ICD-10-CM | POA: Diagnosis not present

## 2020-08-24 DIAGNOSIS — E039 Hypothyroidism, unspecified: Secondary | ICD-10-CM | POA: Diagnosis not present

## 2020-08-24 DIAGNOSIS — R296 Repeated falls: Secondary | ICD-10-CM | POA: Diagnosis not present

## 2020-08-24 DIAGNOSIS — J984 Other disorders of lung: Secondary | ICD-10-CM | POA: Diagnosis not present

## 2020-08-24 DIAGNOSIS — H8113 Benign paroxysmal vertigo, bilateral: Secondary | ICD-10-CM | POA: Diagnosis not present

## 2020-08-24 DIAGNOSIS — N183 Chronic kidney disease, stage 3 unspecified: Secondary | ICD-10-CM | POA: Diagnosis not present

## 2020-08-24 DIAGNOSIS — M6281 Muscle weakness (generalized): Secondary | ICD-10-CM | POA: Diagnosis not present

## 2020-08-31 DIAGNOSIS — R2681 Unsteadiness on feet: Secondary | ICD-10-CM | POA: Diagnosis not present

## 2020-08-31 DIAGNOSIS — J984 Other disorders of lung: Secondary | ICD-10-CM | POA: Diagnosis not present

## 2020-08-31 DIAGNOSIS — H8113 Benign paroxysmal vertigo, bilateral: Secondary | ICD-10-CM | POA: Diagnosis not present

## 2020-08-31 DIAGNOSIS — I10 Essential (primary) hypertension: Secondary | ICD-10-CM | POA: Diagnosis not present

## 2020-08-31 DIAGNOSIS — R262 Difficulty in walking, not elsewhere classified: Secondary | ICD-10-CM | POA: Diagnosis not present

## 2020-08-31 DIAGNOSIS — E039 Hypothyroidism, unspecified: Secondary | ICD-10-CM | POA: Diagnosis not present

## 2020-08-31 DIAGNOSIS — M6281 Muscle weakness (generalized): Secondary | ICD-10-CM | POA: Diagnosis not present

## 2020-08-31 DIAGNOSIS — I2510 Atherosclerotic heart disease of native coronary artery without angina pectoris: Secondary | ICD-10-CM | POA: Diagnosis not present

## 2020-08-31 DIAGNOSIS — I5032 Chronic diastolic (congestive) heart failure: Secondary | ICD-10-CM | POA: Diagnosis not present

## 2020-08-31 DIAGNOSIS — I4891 Unspecified atrial fibrillation: Secondary | ICD-10-CM | POA: Diagnosis not present

## 2020-08-31 DIAGNOSIS — I2699 Other pulmonary embolism without acute cor pulmonale: Secondary | ICD-10-CM | POA: Diagnosis not present

## 2020-08-31 DIAGNOSIS — E441 Mild protein-calorie malnutrition: Secondary | ICD-10-CM | POA: Diagnosis not present

## 2020-08-31 DIAGNOSIS — R296 Repeated falls: Secondary | ICD-10-CM | POA: Diagnosis not present

## 2020-09-02 DIAGNOSIS — N183 Chronic kidney disease, stage 3 unspecified: Secondary | ICD-10-CM | POA: Diagnosis not present

## 2020-09-02 DIAGNOSIS — K5790 Diverticulosis of intestine, part unspecified, without perforation or abscess without bleeding: Secondary | ICD-10-CM | POA: Diagnosis not present

## 2020-09-02 DIAGNOSIS — R32 Unspecified urinary incontinence: Secondary | ICD-10-CM | POA: Diagnosis not present

## 2020-09-02 DIAGNOSIS — Z7901 Long term (current) use of anticoagulants: Secondary | ICD-10-CM | POA: Diagnosis not present

## 2020-09-02 DIAGNOSIS — E785 Hyperlipidemia, unspecified: Secondary | ICD-10-CM | POA: Diagnosis not present

## 2020-09-02 DIAGNOSIS — I130 Hypertensive heart and chronic kidney disease with heart failure and stage 1 through stage 4 chronic kidney disease, or unspecified chronic kidney disease: Secondary | ICD-10-CM | POA: Diagnosis not present

## 2020-09-02 DIAGNOSIS — D631 Anemia in chronic kidney disease: Secondary | ICD-10-CM | POA: Diagnosis not present

## 2020-09-02 DIAGNOSIS — I2510 Atherosclerotic heart disease of native coronary artery without angina pectoris: Secondary | ICD-10-CM | POA: Diagnosis not present

## 2020-09-02 DIAGNOSIS — H8111 Benign paroxysmal vertigo, right ear: Secondary | ICD-10-CM | POA: Diagnosis not present

## 2020-09-02 DIAGNOSIS — K219 Gastro-esophageal reflux disease without esophagitis: Secondary | ICD-10-CM | POA: Diagnosis not present

## 2020-09-02 DIAGNOSIS — I480 Paroxysmal atrial fibrillation: Secondary | ICD-10-CM | POA: Diagnosis not present

## 2020-09-02 DIAGNOSIS — E43 Unspecified severe protein-calorie malnutrition: Secondary | ICD-10-CM | POA: Diagnosis not present

## 2020-09-02 DIAGNOSIS — I2699 Other pulmonary embolism without acute cor pulmonale: Secondary | ICD-10-CM | POA: Diagnosis not present

## 2020-09-02 DIAGNOSIS — Z9181 History of falling: Secondary | ICD-10-CM | POA: Diagnosis not present

## 2020-09-02 DIAGNOSIS — Z87440 Personal history of urinary (tract) infections: Secondary | ICD-10-CM | POA: Diagnosis not present

## 2020-09-02 DIAGNOSIS — E039 Hypothyroidism, unspecified: Secondary | ICD-10-CM | POA: Diagnosis not present

## 2020-09-02 DIAGNOSIS — K589 Irritable bowel syndrome without diarrhea: Secondary | ICD-10-CM | POA: Diagnosis not present

## 2020-09-02 DIAGNOSIS — I5032 Chronic diastolic (congestive) heart failure: Secondary | ICD-10-CM | POA: Diagnosis not present

## 2020-09-08 DIAGNOSIS — I130 Hypertensive heart and chronic kidney disease with heart failure and stage 1 through stage 4 chronic kidney disease, or unspecified chronic kidney disease: Secondary | ICD-10-CM | POA: Diagnosis not present

## 2020-09-08 DIAGNOSIS — Z03818 Encounter for observation for suspected exposure to other biological agents ruled out: Secondary | ICD-10-CM | POA: Diagnosis not present

## 2020-09-08 DIAGNOSIS — E785 Hyperlipidemia, unspecified: Secondary | ICD-10-CM | POA: Diagnosis not present

## 2020-09-08 DIAGNOSIS — D631 Anemia in chronic kidney disease: Secondary | ICD-10-CM | POA: Diagnosis not present

## 2020-09-08 DIAGNOSIS — N183 Chronic kidney disease, stage 3 unspecified: Secondary | ICD-10-CM | POA: Diagnosis not present

## 2020-09-08 DIAGNOSIS — K219 Gastro-esophageal reflux disease without esophagitis: Secondary | ICD-10-CM | POA: Diagnosis not present

## 2020-09-08 DIAGNOSIS — K589 Irritable bowel syndrome without diarrhea: Secondary | ICD-10-CM | POA: Diagnosis not present

## 2020-09-08 DIAGNOSIS — R32 Unspecified urinary incontinence: Secondary | ICD-10-CM | POA: Diagnosis not present

## 2020-09-08 DIAGNOSIS — Z87440 Personal history of urinary (tract) infections: Secondary | ICD-10-CM | POA: Diagnosis not present

## 2020-09-08 DIAGNOSIS — Z9181 History of falling: Secondary | ICD-10-CM | POA: Diagnosis not present

## 2020-09-08 DIAGNOSIS — I2699 Other pulmonary embolism without acute cor pulmonale: Secondary | ICD-10-CM | POA: Diagnosis not present

## 2020-09-08 DIAGNOSIS — I2510 Atherosclerotic heart disease of native coronary artery without angina pectoris: Secondary | ICD-10-CM | POA: Diagnosis not present

## 2020-09-08 DIAGNOSIS — E43 Unspecified severe protein-calorie malnutrition: Secondary | ICD-10-CM | POA: Diagnosis not present

## 2020-09-08 DIAGNOSIS — I480 Paroxysmal atrial fibrillation: Secondary | ICD-10-CM | POA: Diagnosis not present

## 2020-09-08 DIAGNOSIS — K5790 Diverticulosis of intestine, part unspecified, without perforation or abscess without bleeding: Secondary | ICD-10-CM | POA: Diagnosis not present

## 2020-09-08 DIAGNOSIS — H8111 Benign paroxysmal vertigo, right ear: Secondary | ICD-10-CM | POA: Diagnosis not present

## 2020-09-08 DIAGNOSIS — I5032 Chronic diastolic (congestive) heart failure: Secondary | ICD-10-CM | POA: Diagnosis not present

## 2020-09-08 DIAGNOSIS — E039 Hypothyroidism, unspecified: Secondary | ICD-10-CM | POA: Diagnosis not present

## 2020-09-08 DIAGNOSIS — Z7901 Long term (current) use of anticoagulants: Secondary | ICD-10-CM | POA: Diagnosis not present

## 2020-09-13 DIAGNOSIS — Z03818 Encounter for observation for suspected exposure to other biological agents ruled out: Secondary | ICD-10-CM | POA: Diagnosis not present

## 2020-09-14 DIAGNOSIS — I5032 Chronic diastolic (congestive) heart failure: Secondary | ICD-10-CM | POA: Diagnosis not present

## 2020-09-14 DIAGNOSIS — H8111 Benign paroxysmal vertigo, right ear: Secondary | ICD-10-CM | POA: Diagnosis not present

## 2020-09-14 DIAGNOSIS — I130 Hypertensive heart and chronic kidney disease with heart failure and stage 1 through stage 4 chronic kidney disease, or unspecified chronic kidney disease: Secondary | ICD-10-CM | POA: Diagnosis not present

## 2020-09-21 DIAGNOSIS — E782 Mixed hyperlipidemia: Secondary | ICD-10-CM | POA: Diagnosis not present

## 2020-09-21 DIAGNOSIS — N3281 Overactive bladder: Secondary | ICD-10-CM | POA: Diagnosis not present

## 2020-09-21 DIAGNOSIS — I4891 Unspecified atrial fibrillation: Secondary | ICD-10-CM | POA: Diagnosis not present

## 2020-09-21 DIAGNOSIS — E039 Hypothyroidism, unspecified: Secondary | ICD-10-CM | POA: Diagnosis not present

## 2020-09-24 DIAGNOSIS — I480 Paroxysmal atrial fibrillation: Secondary | ICD-10-CM | POA: Diagnosis not present

## 2020-09-24 DIAGNOSIS — F4321 Adjustment disorder with depressed mood: Secondary | ICD-10-CM | POA: Diagnosis not present

## 2020-09-24 DIAGNOSIS — E039 Hypothyroidism, unspecified: Secondary | ICD-10-CM | POA: Diagnosis not present

## 2020-09-24 DIAGNOSIS — I2510 Atherosclerotic heart disease of native coronary artery without angina pectoris: Secondary | ICD-10-CM | POA: Diagnosis not present

## 2020-09-24 DIAGNOSIS — Z7901 Long term (current) use of anticoagulants: Secondary | ICD-10-CM | POA: Diagnosis not present

## 2020-09-24 DIAGNOSIS — H8111 Benign paroxysmal vertigo, right ear: Secondary | ICD-10-CM | POA: Diagnosis not present

## 2020-09-24 DIAGNOSIS — K5790 Diverticulosis of intestine, part unspecified, without perforation or abscess without bleeding: Secondary | ICD-10-CM | POA: Diagnosis not present

## 2020-09-24 DIAGNOSIS — K219 Gastro-esophageal reflux disease without esophagitis: Secondary | ICD-10-CM | POA: Diagnosis not present

## 2020-09-24 DIAGNOSIS — E785 Hyperlipidemia, unspecified: Secondary | ICD-10-CM | POA: Diagnosis not present

## 2020-09-24 DIAGNOSIS — E43 Unspecified severe protein-calorie malnutrition: Secondary | ICD-10-CM | POA: Diagnosis not present

## 2020-09-24 DIAGNOSIS — I2699 Other pulmonary embolism without acute cor pulmonale: Secondary | ICD-10-CM | POA: Diagnosis not present

## 2020-09-24 DIAGNOSIS — R32 Unspecified urinary incontinence: Secondary | ICD-10-CM | POA: Diagnosis not present

## 2020-09-24 DIAGNOSIS — I5032 Chronic diastolic (congestive) heart failure: Secondary | ICD-10-CM | POA: Diagnosis not present

## 2020-09-24 DIAGNOSIS — Z87440 Personal history of urinary (tract) infections: Secondary | ICD-10-CM | POA: Diagnosis not present

## 2020-09-24 DIAGNOSIS — K589 Irritable bowel syndrome without diarrhea: Secondary | ICD-10-CM | POA: Diagnosis not present

## 2020-09-24 DIAGNOSIS — N183 Chronic kidney disease, stage 3 unspecified: Secondary | ICD-10-CM | POA: Diagnosis not present

## 2020-09-24 DIAGNOSIS — D631 Anemia in chronic kidney disease: Secondary | ICD-10-CM | POA: Diagnosis not present

## 2020-09-24 DIAGNOSIS — I130 Hypertensive heart and chronic kidney disease with heart failure and stage 1 through stage 4 chronic kidney disease, or unspecified chronic kidney disease: Secondary | ICD-10-CM | POA: Diagnosis not present

## 2020-09-24 DIAGNOSIS — Z9181 History of falling: Secondary | ICD-10-CM | POA: Diagnosis not present

## 2020-10-14 DIAGNOSIS — F4321 Adjustment disorder with depressed mood: Secondary | ICD-10-CM | POA: Diagnosis not present

## 2020-10-14 DIAGNOSIS — Z03818 Encounter for observation for suspected exposure to other biological agents ruled out: Secondary | ICD-10-CM | POA: Diagnosis not present

## 2020-10-15 DIAGNOSIS — K5790 Diverticulosis of intestine, part unspecified, without perforation or abscess without bleeding: Secondary | ICD-10-CM | POA: Diagnosis not present

## 2020-10-15 DIAGNOSIS — E039 Hypothyroidism, unspecified: Secondary | ICD-10-CM | POA: Diagnosis not present

## 2020-10-15 DIAGNOSIS — Z7901 Long term (current) use of anticoagulants: Secondary | ICD-10-CM | POA: Diagnosis not present

## 2020-10-15 DIAGNOSIS — K219 Gastro-esophageal reflux disease without esophagitis: Secondary | ICD-10-CM | POA: Diagnosis not present

## 2020-10-15 DIAGNOSIS — Z9181 History of falling: Secondary | ICD-10-CM | POA: Diagnosis not present

## 2020-10-15 DIAGNOSIS — I480 Paroxysmal atrial fibrillation: Secondary | ICD-10-CM | POA: Diagnosis not present

## 2020-10-15 DIAGNOSIS — I130 Hypertensive heart and chronic kidney disease with heart failure and stage 1 through stage 4 chronic kidney disease, or unspecified chronic kidney disease: Secondary | ICD-10-CM | POA: Diagnosis not present

## 2020-10-15 DIAGNOSIS — H8111 Benign paroxysmal vertigo, right ear: Secondary | ICD-10-CM | POA: Diagnosis not present

## 2020-10-15 DIAGNOSIS — I2510 Atherosclerotic heart disease of native coronary artery without angina pectoris: Secondary | ICD-10-CM | POA: Diagnosis not present

## 2020-10-15 DIAGNOSIS — I5032 Chronic diastolic (congestive) heart failure: Secondary | ICD-10-CM | POA: Diagnosis not present

## 2020-10-15 DIAGNOSIS — K589 Irritable bowel syndrome without diarrhea: Secondary | ICD-10-CM | POA: Diagnosis not present

## 2020-10-15 DIAGNOSIS — I2699 Other pulmonary embolism without acute cor pulmonale: Secondary | ICD-10-CM | POA: Diagnosis not present

## 2020-10-15 DIAGNOSIS — R32 Unspecified urinary incontinence: Secondary | ICD-10-CM | POA: Diagnosis not present

## 2020-10-15 DIAGNOSIS — N183 Chronic kidney disease, stage 3 unspecified: Secondary | ICD-10-CM | POA: Diagnosis not present

## 2020-10-15 DIAGNOSIS — E43 Unspecified severe protein-calorie malnutrition: Secondary | ICD-10-CM | POA: Diagnosis not present

## 2020-10-15 DIAGNOSIS — Z87440 Personal history of urinary (tract) infections: Secondary | ICD-10-CM | POA: Diagnosis not present

## 2020-10-15 DIAGNOSIS — E785 Hyperlipidemia, unspecified: Secondary | ICD-10-CM | POA: Diagnosis not present

## 2020-10-15 DIAGNOSIS — D631 Anemia in chronic kidney disease: Secondary | ICD-10-CM | POA: Diagnosis not present

## 2020-10-19 DIAGNOSIS — E782 Mixed hyperlipidemia: Secondary | ICD-10-CM | POA: Diagnosis not present

## 2020-10-19 DIAGNOSIS — I4891 Unspecified atrial fibrillation: Secondary | ICD-10-CM | POA: Diagnosis not present

## 2020-10-19 DIAGNOSIS — E039 Hypothyroidism, unspecified: Secondary | ICD-10-CM | POA: Diagnosis not present

## 2020-10-19 DIAGNOSIS — H1033 Unspecified acute conjunctivitis, bilateral: Secondary | ICD-10-CM | POA: Diagnosis not present

## 2020-10-19 DIAGNOSIS — N3281 Overactive bladder: Secondary | ICD-10-CM | POA: Diagnosis not present

## 2020-10-20 DIAGNOSIS — Z03818 Encounter for observation for suspected exposure to other biological agents ruled out: Secondary | ICD-10-CM | POA: Diagnosis not present

## 2020-10-28 DIAGNOSIS — Z03818 Encounter for observation for suspected exposure to other biological agents ruled out: Secondary | ICD-10-CM | POA: Diagnosis not present

## 2020-10-30 DIAGNOSIS — H8111 Benign paroxysmal vertigo, right ear: Secondary | ICD-10-CM | POA: Diagnosis not present

## 2020-10-30 DIAGNOSIS — I2699 Other pulmonary embolism without acute cor pulmonale: Secondary | ICD-10-CM | POA: Diagnosis not present

## 2020-10-30 DIAGNOSIS — I130 Hypertensive heart and chronic kidney disease with heart failure and stage 1 through stage 4 chronic kidney disease, or unspecified chronic kidney disease: Secondary | ICD-10-CM | POA: Diagnosis not present

## 2020-10-30 DIAGNOSIS — I2510 Atherosclerotic heart disease of native coronary artery without angina pectoris: Secondary | ICD-10-CM | POA: Diagnosis not present

## 2020-10-30 DIAGNOSIS — E43 Unspecified severe protein-calorie malnutrition: Secondary | ICD-10-CM | POA: Diagnosis not present

## 2020-10-30 DIAGNOSIS — N183 Chronic kidney disease, stage 3 unspecified: Secondary | ICD-10-CM | POA: Diagnosis not present

## 2020-10-30 DIAGNOSIS — Z87440 Personal history of urinary (tract) infections: Secondary | ICD-10-CM | POA: Diagnosis not present

## 2020-10-30 DIAGNOSIS — R32 Unspecified urinary incontinence: Secondary | ICD-10-CM | POA: Diagnosis not present

## 2020-10-30 DIAGNOSIS — K589 Irritable bowel syndrome without diarrhea: Secondary | ICD-10-CM | POA: Diagnosis not present

## 2020-10-30 DIAGNOSIS — K219 Gastro-esophageal reflux disease without esophagitis: Secondary | ICD-10-CM | POA: Diagnosis not present

## 2020-10-30 DIAGNOSIS — E039 Hypothyroidism, unspecified: Secondary | ICD-10-CM | POA: Diagnosis not present

## 2020-10-30 DIAGNOSIS — Z7901 Long term (current) use of anticoagulants: Secondary | ICD-10-CM | POA: Diagnosis not present

## 2020-10-30 DIAGNOSIS — I480 Paroxysmal atrial fibrillation: Secondary | ICD-10-CM | POA: Diagnosis not present

## 2020-10-30 DIAGNOSIS — E785 Hyperlipidemia, unspecified: Secondary | ICD-10-CM | POA: Diagnosis not present

## 2020-10-30 DIAGNOSIS — K5790 Diverticulosis of intestine, part unspecified, without perforation or abscess without bleeding: Secondary | ICD-10-CM | POA: Diagnosis not present

## 2020-10-30 DIAGNOSIS — I5032 Chronic diastolic (congestive) heart failure: Secondary | ICD-10-CM | POA: Diagnosis not present

## 2020-10-30 DIAGNOSIS — D631 Anemia in chronic kidney disease: Secondary | ICD-10-CM | POA: Diagnosis not present

## 2020-10-30 DIAGNOSIS — Z9181 History of falling: Secondary | ICD-10-CM | POA: Diagnosis not present

## 2020-11-05 DIAGNOSIS — H8111 Benign paroxysmal vertigo, right ear: Secondary | ICD-10-CM | POA: Diagnosis not present

## 2020-11-05 DIAGNOSIS — I5032 Chronic diastolic (congestive) heart failure: Secondary | ICD-10-CM | POA: Diagnosis not present

## 2020-11-05 DIAGNOSIS — I130 Hypertensive heart and chronic kidney disease with heart failure and stage 1 through stage 4 chronic kidney disease, or unspecified chronic kidney disease: Secondary | ICD-10-CM | POA: Diagnosis not present

## 2020-11-16 DIAGNOSIS — F4321 Adjustment disorder with depressed mood: Secondary | ICD-10-CM | POA: Diagnosis not present

## 2020-11-26 ENCOUNTER — Other Ambulatory Visit (HOSPITAL_COMMUNITY): Payer: Self-pay | Admitting: Surgery

## 2020-11-26 ENCOUNTER — Other Ambulatory Visit: Payer: Self-pay

## 2020-11-26 ENCOUNTER — Encounter (HOSPITAL_COMMUNITY): Payer: Self-pay

## 2020-11-26 ENCOUNTER — Emergency Department (HOSPITAL_COMMUNITY): Payer: PPO

## 2020-11-26 ENCOUNTER — Inpatient Hospital Stay (HOSPITAL_COMMUNITY): Payer: PPO

## 2020-11-26 ENCOUNTER — Inpatient Hospital Stay (HOSPITAL_COMMUNITY)
Admission: EM | Admit: 2020-11-26 | Discharge: 2020-12-03 | DRG: 445 | Disposition: A | Discharge status: Skilled Nursing Facility | Payer: PPO | Source: Skilled Nursing Facility | Attending: Internal Medicine | Admitting: Internal Medicine

## 2020-11-26 DIAGNOSIS — I2699 Other pulmonary embolism without acute cor pulmonale: Secondary | ICD-10-CM | POA: Diagnosis not present

## 2020-11-26 DIAGNOSIS — Z79890 Hormone replacement therapy (postmenopausal): Secondary | ICD-10-CM | POA: Diagnosis not present

## 2020-11-26 DIAGNOSIS — K7689 Other specified diseases of liver: Secondary | ICD-10-CM | POA: Diagnosis not present

## 2020-11-26 DIAGNOSIS — H8111 Benign paroxysmal vertigo, right ear: Secondary | ICD-10-CM | POA: Diagnosis not present

## 2020-11-26 DIAGNOSIS — R1312 Dysphagia, oropharyngeal phase: Secondary | ICD-10-CM | POA: Diagnosis not present

## 2020-11-26 DIAGNOSIS — E039 Hypothyroidism, unspecified: Secondary | ICD-10-CM | POA: Diagnosis present

## 2020-11-26 DIAGNOSIS — R41841 Cognitive communication deficit: Secondary | ICD-10-CM | POA: Diagnosis not present

## 2020-11-26 DIAGNOSIS — K5750 Diverticulosis of both small and large intestine without perforation or abscess without bleeding: Secondary | ICD-10-CM | POA: Diagnosis not present

## 2020-11-26 DIAGNOSIS — H1089 Other conjunctivitis: Secondary | ICD-10-CM | POA: Diagnosis present

## 2020-11-26 DIAGNOSIS — Z955 Presence of coronary angioplasty implant and graft: Secondary | ICD-10-CM | POA: Diagnosis not present

## 2020-11-26 DIAGNOSIS — I130 Hypertensive heart and chronic kidney disease with heart failure and stage 1 through stage 4 chronic kidney disease, or unspecified chronic kidney disease: Secondary | ICD-10-CM | POA: Diagnosis not present

## 2020-11-26 DIAGNOSIS — K632 Fistula of intestine: Secondary | ICD-10-CM | POA: Diagnosis not present

## 2020-11-26 DIAGNOSIS — K5790 Diverticulosis of intestine, part unspecified, without perforation or abscess without bleeding: Secondary | ICD-10-CM | POA: Diagnosis not present

## 2020-11-26 DIAGNOSIS — Z66 Do not resuscitate: Secondary | ICD-10-CM | POA: Diagnosis present

## 2020-11-26 DIAGNOSIS — I5032 Chronic diastolic (congestive) heart failure: Secondary | ICD-10-CM | POA: Diagnosis not present

## 2020-11-26 DIAGNOSIS — M6258 Muscle wasting and atrophy, not elsewhere classified, other site: Secondary | ICD-10-CM | POA: Diagnosis not present

## 2020-11-26 DIAGNOSIS — R109 Unspecified abdominal pain: Secondary | ICD-10-CM | POA: Diagnosis present

## 2020-11-26 DIAGNOSIS — K551 Chronic vascular disorders of intestine: Secondary | ICD-10-CM | POA: Diagnosis not present

## 2020-11-26 DIAGNOSIS — Z515 Encounter for palliative care: Secondary | ICD-10-CM | POA: Diagnosis not present

## 2020-11-26 DIAGNOSIS — Z79899 Other long term (current) drug therapy: Secondary | ICD-10-CM | POA: Diagnosis not present

## 2020-11-26 DIAGNOSIS — I4891 Unspecified atrial fibrillation: Secondary | ICD-10-CM | POA: Diagnosis not present

## 2020-11-26 DIAGNOSIS — K5730 Diverticulosis of large intestine without perforation or abscess without bleeding: Secondary | ICD-10-CM | POA: Diagnosis not present

## 2020-11-26 DIAGNOSIS — R55 Syncope and collapse: Secondary | ICD-10-CM | POA: Diagnosis not present

## 2020-11-26 DIAGNOSIS — I10 Essential (primary) hypertension: Secondary | ICD-10-CM | POA: Diagnosis present

## 2020-11-26 DIAGNOSIS — K589 Irritable bowel syndrome without diarrhea: Secondary | ICD-10-CM | POA: Diagnosis not present

## 2020-11-26 DIAGNOSIS — K55059 Acute (reversible) ischemia of intestine, part and extent unspecified: Secondary | ICD-10-CM | POA: Diagnosis not present

## 2020-11-26 DIAGNOSIS — Z9181 History of falling: Secondary | ICD-10-CM | POA: Diagnosis not present

## 2020-11-26 DIAGNOSIS — N179 Acute kidney failure, unspecified: Secondary | ICD-10-CM | POA: Diagnosis present

## 2020-11-26 DIAGNOSIS — K819 Cholecystitis, unspecified: Secondary | ICD-10-CM

## 2020-11-26 DIAGNOSIS — I959 Hypotension, unspecified: Secondary | ICD-10-CM | POA: Diagnosis not present

## 2020-11-26 DIAGNOSIS — I739 Peripheral vascular disease, unspecified: Secondary | ICD-10-CM | POA: Diagnosis present

## 2020-11-26 DIAGNOSIS — I480 Paroxysmal atrial fibrillation: Secondary | ICD-10-CM | POA: Diagnosis present

## 2020-11-26 DIAGNOSIS — R498 Other voice and resonance disorders: Secondary | ICD-10-CM | POA: Diagnosis not present

## 2020-11-26 DIAGNOSIS — Z87440 Personal history of urinary (tract) infections: Secondary | ICD-10-CM | POA: Diagnosis not present

## 2020-11-26 DIAGNOSIS — K823 Fistula of gallbladder: Secondary | ICD-10-CM | POA: Diagnosis not present

## 2020-11-26 DIAGNOSIS — Z7901 Long term (current) use of anticoagulants: Secondary | ICD-10-CM | POA: Diagnosis not present

## 2020-11-26 DIAGNOSIS — N183 Chronic kidney disease, stage 3 unspecified: Secondary | ICD-10-CM | POA: Diagnosis not present

## 2020-11-26 DIAGNOSIS — R531 Weakness: Secondary | ICD-10-CM | POA: Diagnosis not present

## 2020-11-26 DIAGNOSIS — K838 Other specified diseases of biliary tract: Secondary | ICD-10-CM | POA: Diagnosis not present

## 2020-11-26 DIAGNOSIS — Z20822 Contact with and (suspected) exposure to covid-19: Secondary | ICD-10-CM | POA: Diagnosis not present

## 2020-11-26 DIAGNOSIS — R5381 Other malaise: Secondary | ICD-10-CM | POA: Diagnosis not present

## 2020-11-26 DIAGNOSIS — R32 Unspecified urinary incontinence: Secondary | ICD-10-CM | POA: Diagnosis not present

## 2020-11-26 DIAGNOSIS — Z8249 Family history of ischemic heart disease and other diseases of the circulatory system: Secondary | ICD-10-CM | POA: Diagnosis not present

## 2020-11-26 DIAGNOSIS — I2510 Atherosclerotic heart disease of native coronary artery without angina pectoris: Secondary | ICD-10-CM | POA: Diagnosis not present

## 2020-11-26 DIAGNOSIS — Z23 Encounter for immunization: Secondary | ICD-10-CM

## 2020-11-26 DIAGNOSIS — K659 Peritonitis, unspecified: Secondary | ICD-10-CM | POA: Diagnosis not present

## 2020-11-26 DIAGNOSIS — H8110 Benign paroxysmal vertigo, unspecified ear: Secondary | ICD-10-CM | POA: Diagnosis not present

## 2020-11-26 DIAGNOSIS — K810 Acute cholecystitis: Secondary | ICD-10-CM | POA: Diagnosis not present

## 2020-11-26 DIAGNOSIS — Z7189 Other specified counseling: Secondary | ICD-10-CM

## 2020-11-26 DIAGNOSIS — R2681 Unsteadiness on feet: Secondary | ICD-10-CM | POA: Diagnosis not present

## 2020-11-26 DIAGNOSIS — E785 Hyperlipidemia, unspecified: Secondary | ICD-10-CM | POA: Diagnosis present

## 2020-11-26 DIAGNOSIS — R0902 Hypoxemia: Secondary | ICD-10-CM | POA: Diagnosis not present

## 2020-11-26 DIAGNOSIS — N1831 Chronic kidney disease, stage 3a: Secondary | ICD-10-CM | POA: Diagnosis not present

## 2020-11-26 DIAGNOSIS — M6281 Muscle weakness (generalized): Secondary | ICD-10-CM | POA: Diagnosis not present

## 2020-11-26 DIAGNOSIS — K828 Other specified diseases of gallbladder: Secondary | ICD-10-CM | POA: Diagnosis not present

## 2020-11-26 DIAGNOSIS — R1084 Generalized abdominal pain: Secondary | ICD-10-CM | POA: Diagnosis not present

## 2020-11-26 DIAGNOSIS — D631 Anemia in chronic kidney disease: Secondary | ICD-10-CM | POA: Diagnosis not present

## 2020-11-26 DIAGNOSIS — K219 Gastro-esophageal reflux disease without esophagitis: Secondary | ICD-10-CM | POA: Diagnosis present

## 2020-11-26 DIAGNOSIS — E43 Unspecified severe protein-calorie malnutrition: Secondary | ICD-10-CM | POA: Diagnosis not present

## 2020-11-26 DIAGNOSIS — R0602 Shortness of breath: Secondary | ICD-10-CM | POA: Diagnosis not present

## 2020-11-26 DIAGNOSIS — L988 Other specified disorders of the skin and subcutaneous tissue: Secondary | ICD-10-CM

## 2020-11-26 DIAGNOSIS — R000 Tachycardia, unspecified: Secondary | ICD-10-CM | POA: Diagnosis not present

## 2020-11-26 DIAGNOSIS — R2689 Other abnormalities of gait and mobility: Secondary | ICD-10-CM | POA: Diagnosis not present

## 2020-11-26 LAB — HEPARIN LEVEL (UNFRACTIONATED): Heparin Unfractionated: >1.1 IU/mL — ABNORMAL HIGH (ref 0.30–0.70)

## 2020-11-26 LAB — COMPREHENSIVE METABOLIC PANEL
ALT: 16 U/L (ref 0–44)
AST: 26 U/L (ref 15–41)
Albumin: 3.4 g/dL — ABNORMAL LOW (ref 3.5–5.0)
Alkaline Phosphatase: 71 U/L (ref 38–126)
Anion gap: 8 (ref 5–15)
BUN: 33 mg/dL — ABNORMAL HIGH (ref 8–23)
CO2: 29 mmol/L (ref 22–32)
Calcium: 9 mg/dL (ref 8.9–10.3)
Chloride: 96 mmol/L — ABNORMAL LOW (ref 98–111)
Creatinine, Ser: 1.47 mg/dL — ABNORMAL HIGH (ref 0.44–1.00)
GFR, Estimated: 32 mL/min — ABNORMAL LOW (ref >60)
Glucose, Bld: 128 mg/dL — ABNORMAL HIGH (ref 70–99)
Potassium: 4.2 mmol/L (ref 3.5–5.1)
Sodium: 133 mmol/L — ABNORMAL LOW (ref 135–145)
Total Bilirubin: 0.9 mg/dL (ref 0.3–1.2)
Total Protein: 7.3 g/dL (ref 6.5–8.1)

## 2020-11-26 LAB — CBG MONITORING, ED: Glucose-Capillary: 139 mg/dL — ABNORMAL HIGH (ref 70–99)

## 2020-11-26 LAB — RESP PANEL BY RT-PCR (FLU A&B, COVID) ARPGX2
Influenza A by PCR: NEGATIVE
Influenza B by PCR: NEGATIVE
SARS Coronavirus 2 by RT PCR: NEGATIVE

## 2020-11-26 LAB — CBC WITH DIFFERENTIAL/PLATELET
Abs Immature Granulocytes: 0.06 10*3/uL (ref 0.00–0.07)
Basophils Absolute: 0 10*3/uL (ref 0.0–0.1)
Basophils Relative: 0 %
Eosinophils Absolute: 0.1 10*3/uL (ref 0.0–0.5)
Eosinophils Relative: 1 %
HCT: 44.8 % (ref 36.0–46.0)
Hemoglobin: 14.7 g/dL (ref 12.0–15.0)
Immature Granulocytes: 0 %
Lymphocytes Relative: 11 %
Lymphs Abs: 1.9 10*3/uL (ref 0.7–4.0)
MCH: 32.2 pg (ref 26.0–34.0)
MCHC: 32.8 g/dL (ref 30.0–36.0)
MCV: 98.2 fL (ref 80.0–100.0)
Monocytes Absolute: 0.8 10*3/uL (ref 0.1–1.0)
Monocytes Relative: 5 %
Neutro Abs: 14 10*3/uL — ABNORMAL HIGH (ref 1.7–7.7)
Neutrophils Relative %: 83 %
Platelets: 165 10*3/uL (ref 150–400)
RBC: 4.56 MIL/uL (ref 3.87–5.11)
RDW: 13.2 % (ref 11.5–15.5)
WBC: 16.9 10*3/uL — ABNORMAL HIGH (ref 4.0–10.5)
nRBC: 0 % (ref 0.0–0.2)

## 2020-11-26 LAB — TROPONIN I (HIGH SENSITIVITY)
Troponin I (High Sensitivity): 9 ng/L (ref <18)
Troponin I (High Sensitivity): 8 ng/L (ref <18)

## 2020-11-26 LAB — LACTIC ACID, PLASMA: Lactic Acid, Venous: 1.5 mmol/L (ref 0.5–1.9)

## 2020-11-26 LAB — APTT: aPTT: 42 seconds — ABNORMAL HIGH (ref 24–36)

## 2020-11-26 MED ORDER — IOHEXOL 350 MG/ML SOLN
65.0000 mL | Freq: Once | INTRAVENOUS | Status: AC | PRN
  Administered 2020-11-26: 65 mL via INTRAVENOUS

## 2020-11-26 MED ORDER — DILTIAZEM HCL 30 MG PO TABS
60.0000 mg | ORAL_TABLET | Freq: Three times a day (TID) | ORAL | Status: DC
  Administered 2020-11-26 – 2020-11-27 (×2): 60 mg via ORAL
  Filled 2020-11-26 (×2): qty 2

## 2020-11-26 MED ORDER — IOHEXOL 350 MG/ML SOLN: 100.0000 mL | Freq: Once | INTRAVENOUS | Status: DC | PRN

## 2020-11-26 MED ORDER — PIPERACILLIN-TAZOBACTAM IN DEX 2-0.25 GM/50ML IV SOLN
2.2500 g | Freq: Three times a day (TID) | INTRAVENOUS | Status: DC
  Administered 2020-11-26 – 2020-12-01 (×14): 2.25 g via INTRAVENOUS
  Filled 2020-11-26 (×16): qty 50

## 2020-11-26 MED ORDER — ONDANSETRON HCL 4 MG/2ML IJ SOLN
4.0000 mg | Freq: Four times a day (QID) | INTRAMUSCULAR | Status: DC | PRN
  Administered 2020-11-29: 4 mg via INTRAVENOUS
  Filled 2020-11-26: qty 2

## 2020-11-26 MED ORDER — MIRABEGRON ER 25 MG PO TB24
50.0000 mg | ORAL_TABLET | Freq: Every day | ORAL | Status: DC
  Administered 2020-11-26 – 2020-12-02 (×6): 50 mg via ORAL
  Filled 2020-11-26 (×8): qty 2

## 2020-11-26 MED ORDER — GADOBUTROL 1 MMOL/ML IV SOLN
5.0000 mL | Freq: Once | INTRAVENOUS | Status: AC | PRN
  Administered 2020-11-26: 5 mL via INTRAVENOUS

## 2020-11-26 MED ORDER — HYDROCODONE-ACETAMINOPHEN 5-325 MG PO TABS
1.0000 | ORAL_TABLET | ORAL | Status: DC | PRN
  Administered 2020-11-26: 1 via ORAL
  Filled 2020-11-26: qty 1

## 2020-11-26 MED ORDER — HEPARIN (PORCINE) 25000 UT/250ML-% IV SOLN
650.0000 [IU]/h | INTRAVENOUS | Status: DC
  Administered 2020-11-26: 700 [IU]/h via INTRAVENOUS
  Administered 2020-11-28: 650 [IU]/h via INTRAVENOUS
  Filled 2020-11-26 (×3): qty 250

## 2020-11-26 MED ORDER — VITAMIN B-12 1000 MCG PO TABS
1000.0000 ug | ORAL_TABLET | Freq: Every day | ORAL | Status: DC
  Administered 2020-11-27 – 2020-12-03 (×7): 1000 ug via ORAL
  Filled 2020-11-26 (×7): qty 1

## 2020-11-26 MED ORDER — ATORVASTATIN CALCIUM 10 MG PO TABS
10.0000 mg | ORAL_TABLET | Freq: Every day | ORAL | Status: DC
  Administered 2020-11-26 – 2020-12-02 (×7): 10 mg via ORAL
  Filled 2020-11-26 (×7): qty 1

## 2020-11-26 MED ORDER — LEVOTHYROXINE SODIUM 100 MCG PO TABS
100.0000 ug | ORAL_TABLET | Freq: Every day | ORAL | Status: DC
  Administered 2020-11-27 – 2020-12-03 (×7): 100 ug via ORAL
  Filled 2020-11-26 (×7): qty 1

## 2020-11-26 MED ORDER — PIPERACILLIN-TAZOBACTAM 3.375 G IVPB 30 MIN
3.3750 g | Freq: Once | INTRAVENOUS | Status: AC
  Administered 2020-11-26: 3.375 g via INTRAVENOUS
  Filled 2020-11-26: qty 50

## 2020-11-26 MED ORDER — METOPROLOL TARTRATE 50 MG PO TABS
100.0000 mg | ORAL_TABLET | Freq: Two times a day (BID) | ORAL | Status: DC
  Administered 2020-11-26 – 2020-12-03 (×14): 100 mg via ORAL
  Filled 2020-11-26 (×3): qty 2
  Filled 2020-11-26: qty 4
  Filled 2020-11-26 (×2): qty 2
  Filled 2020-11-26: qty 4
  Filled 2020-11-26: qty 2
  Filled 2020-11-26: qty 4
  Filled 2020-11-26 (×5): qty 2

## 2020-11-26 MED ORDER — ACETAMINOPHEN 325 MG PO TABS
650.0000 mg | ORAL_TABLET | Freq: Four times a day (QID) | ORAL | Status: DC | PRN
  Administered 2020-12-01 – 2020-12-03 (×3): 650 mg via ORAL
  Filled 2020-11-26 (×4): qty 2

## 2020-11-26 MED ORDER — ACETAMINOPHEN 650 MG RE SUPP: 650.0000 mg | Freq: Four times a day (QID) | RECTAL | Status: DC | PRN

## 2020-11-26 MED ORDER — SODIUM CHLORIDE 0.9 % IV SOLN: INTRAVENOUS | Status: DC

## 2020-11-26 MED ORDER — ONDANSETRON HCL 4 MG PO TABS: 4.0000 mg | ORAL_TABLET | Freq: Four times a day (QID) | ORAL | Status: DC | PRN

## 2020-11-26 NOTE — Progress Notes (Signed)
Pharmacy Antibiotic Note  [name] is a 87 y.o. 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  Pharmacy has been consulted for zosyn dosing.  Plan: Zosyn 3.375 gm IV x 1 followed by Zosyn 2.25 gm IV q8h  Height: '5\' 5"'$  (165.1 cm) Weight: 49.9 kg (110 lb) IBW/kg (Calculated) : 57  Temp (24hrs), Avg:98.1 F (36.7 C), Min:98.1 F (36.7 C), Max:98.1 F (36.7 C)  Recent Labs  Lab 11/26/20 1223 11/26/20 1708  WBC 16.9*  --   CREATININE 1.47*  --   LATICACIDVEN  --  1.5    Estimated Creatinine Clearance: 16.8 mL/min (A) (by C-G formula based on SCr of 1.47 mg/dL (H)).    No Known Allergies  Thank you for allowing pharmacy to be a part of this patient's care.  Eudelia Bunch, Pharm.D 11/26/2020 6:34 PM

## 2020-11-26 NOTE — Consult Note (Signed)
[facility] Emergency Dept Surgery Consult Note  [name] 1920-12-08  [id number]:[id number].    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  HPI:  Patient is a 87 year old female who lives in skilled nursing facility who was brought to the ED after a syncopal episode.  Patient does not remember any of it.  You cannot get a real history from her, she says she feels bad, and is tender wherever you touch her on her abdomen and lower extremities.  Her son says this has all occurred since Sunday.  She gets around the SNF with a walker, and pain does not appear to be associated with PO intake.  She says this is like the pain she had last year, but she cannot even remember breakfast.  Work-up shows she is afebrile, blood pressure is mildly elevated 127/97.  Labs show sodium of 133, potassium 4.2, chloride 96, CO2 29, glucose 128, BUN 33, creatinine 1.47, LFTs are normal, GFR is 32.  WBC 16.9, H/H14.7/44.8, platelets 165,000. A CT angio of the of the abdomen pelvis with contrast showed a proximal SMA stenosis, patent celiac axis and IMA.  Left SFA origin stenosis of possible hemodynamic significance.  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  There is also descending and sigmoid diverticulosis without regional inflammatory changes.  ROS: Review of Systems  Unable to perform ROS: Mental acuity   Family History  Problem Relation Age of Onset   Myasthenia gravis Mother    Hip fracture Father    Heart disease Other        several uncles with CAD    Past Medical History:  Diagnosis Date   CAD (coronary artery disease)    a. 07/2007 PCI/Promus DES to LAD;  b. 02/2010 PCI/Promus DES to dRCA.;  c. 12/2010 Lexi MV: EF 88%, no isch/infarct.;  d.  02/2012 Cath: LM nl, LAD 20p, patent mid stent, 60d, D1 sm 70ost, D2 sm, diff, LCX sm , OM1 diff 24m(no change), RCA 40ost, patent stent, PD/PL nl, EF 75% w/o rwma's.   Carotid bruit    Diverticulosis    GERD (gastroesophageal reflux disease)    Hemorrhoids    History of echocardiogram    a. 06/2012 Echo: EF nl, mild LVH.   HLD (hyperlipidemia)    HTN (hypertension)    Hypothyroidism    IBS (irritable bowel syndrome)     Past Surgical History:  Procedure Laterality Date   BACK SURGERY     CORONARY ANGIOPLASTY WITH STENT PLACEMENT     hysterectomy - unknown type     LEFT HEART CATHETERIZATION WITH CORONARY ANGIOGRAM N/A 02/13/2012   Procedure: LEFT HEART CATHETERIZATION WITH CORONARY ANGIOGRAM;  Surgeon: PJosue Hector MD;  [facility];  Service: Cardiovascular;  Laterality: N/A;   RECTOCELE REPAIR      Social History:  reports that she has never smoked. She has never used smokeless tobacco. She reports that she does not drink alcohol and does not use drugs.  Allergies: No Known Allergies  Prior to Admission medications   Medication Sig Start Date End Date Taking? Authorizing Provider  acetaminophen (TYLENOL) 500 MG tablet Take 1,000 mg by mouth every 8 (eight) hours as needed (for pain).   Yes [provider]  apixaban (ELIQUIS) 2.5 MG TABS tablet Take 1 tablet (2.5 mg total) by mouth  2 (two) times daily. 08/10/20  Yes Mercy Riding, MD  atorvastatin (LIPITOR) 10 MG tablet Take 1 tablet (10 mg total) by mouth daily. 02/19/20  Yes Aline August, MD  diltiazem (CARDIZEM) 60 MG tablet Take 1 tablet (60 mg total) by mouth 3 (three) times daily. 05/28/20  Yes Shelly Coss, MD  furosemide (LASIX) 20 MG tablet Take 1 tablet (20 mg total) by mouth every Monday, Wednesday, and Friday. Patient taking differently: Take 10 mg by mouth See admin instructions. '10mg'$  oral daily on Tuesday, Thursday, Saturday, Sunday 08/12/20  Yes Gonfa, Charlesetta Ivory, MD  levothyroxine (SYNTHROID,  LEVOTHROID) 100 MCG tablet Take 100 mcg by mouth daily. 12/13/15  Yes [provider]  magnesium citrate SOLN Take 296 mLs (1 Bottle total) by mouth daily as needed for severe constipation. 08/10/20  Yes Mercy Riding, MD  metoprolol tartrate (LOPRESSOR) 100 MG tablet Take 100 mg by mouth 2 (two) times daily.   Yes [provider]  Multiple Vitamins-Minerals (ONE-A-DAY PROACTIVE 65+ PO) Take 1 tablet by mouth at bedtime.   Yes [provider]  MYRBETRIQ 50 MG TB24 tablet Take 50 mg by mouth every evening.  10/31/19  Yes [provider]  nitroGLYCERIN (NITROSTAT) 0.4 MG SL tablet Place 0.4 mg under the tongue every 5 (five) minutes as needed. For chest pain   Yes [provider]  OVER THE COUNTER MEDICATION 3 (three) times daily. MedPass 2.0   Yes [provider]  polyethylene glycol (MIRALAX / GLYCOLAX) 17 g packet Take 17 g by mouth 2 (two) times daily as needed for mild constipation. 08/10/20  Yes Mercy Riding, MD  senna-docusate (SENOKOT-S) 8.6-50 MG tablet Take 1 tablet by mouth 2 (two) times daily as needed for moderate constipation. 08/10/20  Yes Mercy Riding, MD  trimethoprim (TRIMPEX) 100 MG tablet Take 100 mg by mouth in the morning.   Yes [provider]  vitamin B-12 (CYANOCOBALAMIN) 1000 MCG tablet Take 1,000 mcg by mouth daily.   Yes [provider]  feeding supplement (ENSURE ENLIVE / ENSURE PLUS) LIQD Take 237 mLs by mouth 3 (three) times daily between meals. Patient not taking: No sig reported 08/11/20   Mercy Riding, MD     Blood pressure (!) 127/97, pulse 96, temperature 98.1 F (36.7 C), temperature source Oral, resp. rate 16, height '5\' 5"'$  (1.651 m), weight 49.9 kg, SpO2 94 %. Physical Exam:  General: pleasant,frail, deconditioned elderly white female who is laying in bed in NAD, but clearly does not feel well. HEENT: head is normocephalic, atraumatic.  Sclera are noninjected.  Pupils are equal.  Ears and nose  without any masses or lesions.  Mouth is pink and moist Heart: regular, rate, and rhythm.  Normal s1,s2. No obvious murmurs, gallops, or rubs noted.   pulses are diminished in all extremities bilaterally, but no clubbing, ischemia, or cyanosis. Lungs: CTAB, no wheezes, rhonchi, or rales noted.  Respiratory effort nonlabored. Sats 95% on R/A Abd: soft, she is tender wherever you touch her,  ND, BS hypoactive, no masses, hernias, or organomegaly.  Lower midline incision well healed. MS: all 4 extremities are symmetrical with no cyanosis, clubbing, or edema. Skin: warm and dry with no masses, lesions, or rashes; multiple senile keratosis  Neuro: Cranial nerves 2-12 grossly intact, sensation is normal throughout Psych: A&Ox3 with an appropriate affect.  Her memory is very poor, clock is wrong in the room and she ask me 3 times if that was the right  time.  Results for orders placed or performed during the hospital encounter of 11/26/20 (from the past 48 hour(s))  CBG monitoring, ED     Status: Abnormal   Collection Time: 11/26/20 11:59 AM  Result Value Ref Range   Glucose-Capillary 139 (H) 70 - 99 mg/dL    Comment: Glucose reference range applies only to samples taken after fasting for at least 8 hours.  Comprehensive metabolic panel     Status: Abnormal   Collection Time: 11/26/20 12:23 PM  Result Value Ref Range   Sodium 133 (L) 135 - 145 mmol/L   Potassium 4.2 3.5 - 5.1 mmol/L   Chloride 96 (L) 98 - 111 mmol/L   CO2 29 22 - 32 mmol/L   Glucose, Bld 128 (H) 70 - 99 mg/dL    Comment: Glucose reference range applies only to samples taken after fasting for at least 8 hours.   BUN 33 (H) 8 - 23 mg/dL   Creatinine, Ser 1.47 (H) 0.44 - 1.00 mg/dL   Calcium 9.0 8.9 - 10.3 mg/dL   Total Protein 7.3 6.5 - 8.1 g/dL   Albumin 3.4 (L) 3.5 - 5.0 g/dL   AST 26 15 - 41 U/L   ALT 16 0 - 44 U/L   Alkaline Phosphatase 71 38 - 126 U/L   Total Bilirubin 0.9 0.3 - 1.2 mg/dL   GFR, Estimated 32 (L) >60  mL/min    Comment: (NOTE) Calculated using the CKD-EPI Creatinine Equation (2021)    Anion gap 8 5 - 15    Comment: Performed at [facility], [place] [address], [place], [place] [address]  Troponin I (High Sensitivity)     Status: None   Collection Time: 11/26/20 12:23 PM  Result Value Ref Range   Troponin I (High Sensitivity) 9 <18 ng/L    Comment: (NOTE) Elevated high sensitivity troponin I (hsTnI) values and significant  changes across serial measurements may suggest ACS but many other  chronic and acute conditions are known to elevate hsTnI results.  Refer to the "Links" section for chest pain algorithms and additional  guidance. Performed at [facility], [place] [address] [place], [place] [address]   CBC with Differential     Status: Abnormal   Collection Time: 11/26/20 12:23 PM  Result Value Ref Range   WBC 16.9 (H) 4.0 - 10.5 K/uL   RBC 4.56 3.87 - 5.11 MIL/uL   Hemoglobin 14.7 12.0 - 15.0 g/dL   HCT 44.8 36.0 - 46.0 %   MCV 98.2 80.0 - 100.0 fL   MCH 32.2 26.0 - 34.0 pg   MCHC 32.8 30.0 - 36.0 g/dL   RDW 13.2 11.5 - 15.5 %   Platelets 165 150 - 400 K/uL   nRBC 0.0 0.0 - 0.2 %   Neutrophils Relative % 83 %   Neutro Abs 14.0 (H) 1.7 - 7.7 K/uL   Lymphocytes Relative 11 %   Lymphs Abs 1.9 0.7 - 4.0 K/uL   Monocytes Relative 5 %   Monocytes Absolute 0.8 0.1 - 1.0 K/uL   Eosinophils Relative 1 %   Eosinophils Absolute 0.1 0.0 - 0.5 K/uL   Basophils Relative 0 %   Basophils Absolute 0.0 0.0 - 0.1 K/uL   Immature Granulocytes 0 %   Abs Immature Granulocytes 0.06 0.00 - 0.07 K/uL    Comment: Performed at [facility], [place] [address] [place], [place] [address]   CT Angio Abd/Pel W and/or Wo Contrast  Result  Date: 11/26/2020 CLINICAL DATA:  Near syncope, acute mesenteric ischemia EXAM: CTA ABDOMEN AND PELVIS WITH CONTRAST TECHNIQUE: Multidetector CT imaging of the abdomen and pelvis was performed using the standard  protocol during bolus administration of intravenous contrast. Multiplanar reconstructed images and MIPs were obtained and reviewed to evaluate the vascular anatomy. CONTRAST:  67m OMNIPAQUE IOHEXOL 350 MG/ML SOLN COMPARISON:  05/26/2020 FINDINGS: VASCULAR Coronary calcifications. At least mild cardiomegaly with right atrial enlargement. Aorta: Extensive calcified atheromatous plaque. No aneurysm, dissection, or stenosis. Celiac: Calcified ostial plaque resulting in short segment mild stenosis, patent distally. SMA: Partially calcified plaque extending from the origin approximately 1.7 cm resulting in segmental stenosis of at least moderate severity, the distal vessel mildly atheromatous but patent with classic branch anatomy. Renals: Single left, with calcified ostial plaque over length of approximately 1 cm resulting in at least mild stenosis, patent distally. Single right, with calcified ostial plaque resulting in mild short-segment stenosis, patent distally. IMA: Patent, with short-segment origin stenosis related to aortic wall plaque, and atheromatous irregularity of indeterminate hemodynamic significance approximately 2 cm distally, with patent distal branch anatomy. Inflow: Moderate calcified plaque without high-grade stenosis. Mild tortuosity. Proximal Outflow: Calcified plaque at the left SFA origin resulting in stenosis of at least mild severity, atheromatous but patent in the visualized distal portion. Veins: Patent hepatic veins, portal vein, SVC, splenic vein, bilateral renal veins, iliac confluence and IVC. Review of the MIP images confirms the above findings. NON-VASCULAR Lower chest: No pleural or pericardial effusion. Hepatobiliary: Transient hepatic attenuation difference in arterial phase scanning, with no discrete lesion evident on venous phase. Calcified subcapsular lesion in segment 7 stable since 02/14/2012. There is a tubular subhepatic process which appears confluent with the hepatic flexure  of the colon and has been consistently present on prior studies, containing some gas and fluid , suggestive of gallbladder with cholecystocolonic fistula. There is new marked thickening of the wall of the presumed gallbladder, and mucosal enhancement. No calcified gallstones. Pancreas: Unremarkable. No pancreatic ductal dilatation or surrounding inflammatory changes. Spleen: Normal in size without focal abnormality. Adrenals/Urinary Tract: Adrenal glands unremarkable. 2.2 cm probable cyst, upper pole left kidney, stable since 02/10/2020. No hydronephrosis. Urinary bladder is physiologically distended. Stomach/Bowel: Stomach is incompletely distended. Duodenal diverticulum. The small bowel is decompressed. Appendix not identified. 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 Apparent patulous segment of colon anteriorly at the level of the aortic bifurcation which is stable since 05/26/2020. Innumerable distal descending and sigmoid diverticula without regional inflammatory change. Lymphatic: No abdominal or pelvic adenopathy. Reproductive: Status post hysterectomy. No adnexal masses. Other: Bilateral pelvic phleboliths.  No ascites.  No   free air. Musculoskeletal: Multilevel spondylitic changes in the lumbar spine. Osteitis pubis. No acute fracture or worrisome bone lesion. IMPRESSION: 1. 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 Patent celiac axis and inferior mesenteric artery may provide adequate collateral circulation to the mesentery. 2. Left SFA origin stenosis of possible hemodynamic significance. Correlate with clinical symptomatology and ABIs. 3. 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 4. Descending and sigmoid diverticulosis. Electronically Signed   By: DLucrezia EuropeM.D.   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 Johney Maine has already done a note and discussed with Dr. Zenia Resides.  From Dr. Clyda Greener note: "87 year old woman.  No definite prior history of cholecystectomy.  Abdominal pain.  Multiple medical problems.  Had ultrasound 2008 with no gallbladder, CT scan February 2022 with no gallbladder.  Now however looks like there is a thickened cystic mass in the right upper quadrant that seems classic for gallbladder with a connection to the hepatic flexure.    It would be very atypical to get a cholecysto-colonic fistula.  Usually would prefer to go to the duodenum.  Another possibility could be a duodenal diverticulum.  I reviewed the films with Dr. Jarvis Newcomer with radiology.  Duodenum normal.  No definite duodenal diverticulum.  Reviewed with Dr. Zenia Resides.  We wonder if perhaps it could be just a fold the transverse colon.  Dr. 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 Discussed with my hepatobiliary pancreatic surgical partner,  Dr. Zenia Resides.  We both agree MRCP would be helpful to better delineate anatomy. If she does have a fistula, standard of care is cholecystectomy with partial colonic resection.  However, in a 87 year old fragile patient, I would be hesitant to proceed with surgery unless there are hard indication such as perforation, obstruction, and palliated cholangitis, hemorrhage.  I do not sense any strong indications.  Plan:  antibiotics, and MRCP ordered. We will follow with you.  Earnstine Regal [facility] Surgery 11/26/2020, 4:03 PM Please see  Amion for pager number during day hours 7:00am-4:30pm

## 2020-11-26 NOTE — Progress Notes (Signed)
ANTICOAGULATION CONSULT NOTE - Initial Consult  Pharmacy Consult for heparin  Indication: atrial fibrillation - bridge therapy while apixaban on hold  No Known Allergies  Patient Measurements: Height: '5\' 5"'$  (165.1 cm) Weight: 49.9 kg (110 lb) IBW/kg (Calculated) : 57 Heparin Dosing Weight: 49.9 kg   Vital Signs: Temp: 98.1 F (36.7 C) (08/18 1158) Temp Source: Oral (08/18 1158) BP: 127/97 (08/18 1601) Pulse Rate: 96 (08/18 1601)  Labs: Recent Labs    11/26/20 1223 11/26/20 1437  HGB 14.7  --   HCT 44.8  --   PLT 165  --   CREATININE 1.47*  --   TROPONINIHS 9 8    Estimated Creatinine Clearance: 16.8 mL/min (A) (by C-G formula based on SCr of 1.47 mg/dL (H)).   Medical History: Past Medical History:  Diagnosis Date   CAD (coronary artery disease)    a. 07/2007 PCI/Promus DES to LAD;  b. 02/2010 PCI/Promus DES to dRCA.;  c. 12/2010 Lexi MV: EF 88%, no isch/infarct.;  d. 02/2012 Cath: LM nl, LAD 20p, patent mid stent, 60d, D1 sm 70ost, D2 sm, diff, LCX sm , OM1 diff 47m(no change), RCA 40ost, patent stent, PD/PL nl, EF 75% w/o rwma's.   Carotid bruit    Diverticulosis    GERD (gastroesophageal reflux disease)    Hemorrhoids    History of echocardiogram    a. 06/2012 Echo: EF nl, mild LVH.   HLD (hyperlipidemia)    HTN (hypertension)    Hypothyroidism    IBS (irritable bowel syndrome)     Medications:  Apixaban 2.5 mg po BID, last dose 8/18 at 08 am  Assessment: 87yo F on apixaban 2.5 mg po BID PTA for Afib.  Pharmacy consulted to dose heparin for bridge therapy while apixaban on hold during work up of possible fistula between gallbladder and colon.  Apixa 2.5 po bid last dose 8/18 at 08 am CBC WNL, SCr 1.47  Goal of Therapy:  Heparin level 0.3-0.7 units/ml aPTT 66 - 102 seconds Monitor platelets by anticoagulation protocol: Yes   Plan:  Draw baseline aPTT & heparin level Start heparin at 8 pm with no bolus at 700 units/hr and check 8 hr aPTT & heparin  level Daily CBC, aPTT & heparin level Will adjust heparin using aPTT until apixaban has cleared.  MEudelia Bunch Pharm.D 11/26/2020 6:31 PM

## 2020-11-26 NOTE — ED Provider Notes (Signed)
  Physical Exam  BP (!) 127/97 (BP Location: Right Arm)   Pulse 96   Temp 98.1 F (36.7 C) (Oral)   Resp 16   Ht '5\' 5"'$  (1.651 m)   Wt 49.9 kg   SpO2 94%   BMI 18.30 kg/m   Physical Exam Constitutional:      Appearance: Normal appearance.  HENT:     Head: Normocephalic and atraumatic.     Right Ear: External ear normal.     Left Ear: External ear normal.     Nose: Nose normal.  Eyes:     General: No scleral icterus. Cardiovascular:     Rate and Rhythm: Tachycardia present. Rhythm irregular.     Pulses: Normal pulses.     Heart sounds: Normal heart sounds.  Pulmonary:     Effort: Pulmonary effort is normal. No respiratory distress.     Breath sounds: Normal breath sounds.  Abdominal:     General: Abdomen is flat.     Tenderness: There is abdominal tenderness.     Comments: RLQ tenderness, not peritoneal  Musculoskeletal:        General: Normal range of motion.     Cervical back: Normal range of motion. No rigidity.  Skin:    General: Skin is warm and dry.     Capillary Refill: Capillary refill takes less than 2 seconds.  Neurological:     Mental Status: She is alert. Mental status is at baseline.     GCS: GCS eye subscore is 4. GCS verbal subscore is 5. GCS motor subscore is 6.  Psychiatric:        Mood and Affect: Mood normal.        Behavior: Behavior normal.    ED Course/Procedures     Procedures  MDM   87 yo female received at hand off, presented to ED 2/2 near syncope and abdominal pain. From SNF. Poor historian. Afib on Eliquis. See ED note from prior physician for full note.  In ED leukocytosis, LFT's WNL. 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 Started on Zosyn. Hemodynamically stable. Trop/ECG stable, no STEMI, afib on ECG; known hx of this.  D/w general surgery regarding treatment, they will come to evaluate the patient.   Admit patient for above. D/w [facility] and they accept patient for admission. COVID testing  pending.             Jeanell Sparrow, DO 11/26/20 1637

## 2020-11-26 NOTE — ED Provider Notes (Signed)
[facility] DEPT Provider Note   CSN: [id number] Arrival date & time: 11/26/20  1150     History Chief Complaint  Patient presents with   Near Syncope    [name] is a 87 y.o. female.   Near Syncope Associated symptoms include abdominal pain. Pertinent negatives include no shortness of breath. Patient with near syncopal episode.  Does not really know what happens.  States her abdomen hurts.  Lives at [facility].  Is on Eliquis for atrial fibrillation.  No nausea or vomiting.  Denies chest pain.  Cannot really tell me what happened     Past Medical History:  Diagnosis Date   CAD (coronary artery disease)    a. 07/2007 PCI/Promus DES to LAD;  b. 02/2010 PCI/Promus DES to dRCA.;  c. 12/2010 Lexi MV: EF 88%, no isch/infarct.;  d. 02/2012 Cath: LM nl, LAD 20p, patent mid stent, 60d, D1 sm 70ost, D2 sm, diff, LCX sm , OM1 diff 51m(no change), RCA 40ost, patent stent, PD/PL nl, EF 75% w/o rwma's.   Carotid bruit    Diverticulosis    GERD (gastroesophageal reflux disease)    Hemorrhoids    History of echocardiogram    a. 06/2012 Echo: EF nl, mild LVH.   HLD (hyperlipidemia)    HTN (hypertension)    Hypothyroidism    IBS (irritable bowel syndrome)     Patient Active Problem List   Diagnosis Date Noted   Protein-calorie malnutrition, severe 08/11/2020   Near syncope 08/05/2020   Rapid atrial fibrillation ([facility] 05/26/2020   Acute pulmonary embolism ([facility] 02/10/2020   Chronic diastolic CHF (congestive heart failure) ([facility] 02/10/2020   New onset atrial fibrillation ([facility] 02/10/2020   Lung nodule 02/10/2020   History of echocardiogram    GERD (gastroesophageal reflux disease)    Diverticulosis    Carotid bruit    IBS (irritable bowel syndrome)    Hypothyroidism    HTN (hypertension)    HLD (hyperlipidemia)    CAD (coronary artery disease)    Atypical chest pain 06/14/2012   Chest pain 02/12/2012   Fatigue 04/22/2011    CAROTID BRUIT 03/19/2009   HYPOTHYROIDISM 08/29/2008   HEMORRHOIDS 08/29/2008   GERD 08/29/2008   DIVERTICULOSIS, COLON 08/29/2008   HYPERLIPIDEMIA 07/04/2008   HYPERTENSION, BENIGN 07/04/2008   CAD, NATIVE VESSEL 07/04/2008   IRRITABLE BOWEL SYNDROME 07/04/2008    Past Surgical History:  Procedure Laterality Date   BACK SURGERY     CORONARY ANGIOPLASTY WITH STENT PLACEMENT     hysterectomy - unknown type     LEFT HEART CATHETERIZATION WITH CORONARY ANGIOGRAM N/A 02/13/2012   Procedure: LEFT HEART CATHETERIZATION WITH CORONARY ANGIOGRAM;  Surgeon: PJosue Hector MD;  [facility];  Service: Cardiovascular;  Laterality: N/A;   RECTOCELE REPAIR       OB History   No obstetric history on file.     Family History  Problem Relation Age of Onset   Myasthenia gravis Mother    Hip fracture Father    Heart disease Other        several uncles with CAD    Social History   Tobacco Use   Smoking status: Never   Smokeless tobacco: Never  Vaping Use   Vaping Use: Never used  Substance Use Topics   Alcohol use: No   Drug use: No    Home Medications Prior to Admission medications   Medication Sig Start Date End Date Taking? Authorizing Provider  acetaminophen (  TYLENOL) 500 MG tablet Take 1,000 mg by mouth every 8 (eight) hours as needed (for pain).   Yes [provider]  apixaban (ELIQUIS) 2.5 MG TABS tablet Take 1 tablet (2.5 mg total) by mouth 2 (two) times daily. 08/10/20  Yes Mercy Riding, MD  atorvastatin (LIPITOR) 10 MG tablet Take 1 tablet (10 mg total) by mouth daily. 02/19/20  Yes Aline August, MD  diltiazem (CARDIZEM) 60 MG tablet Take 1 tablet (60 mg total) by mouth 3 (three) times daily. 05/28/20  Yes Shelly Coss, MD  furosemide (LASIX) 20 MG tablet Take 1 tablet (20 mg total) by mouth every Monday, Wednesday, and Friday. Patient taking differently: Take 10 mg by mouth See admin instructions. '10mg'$  oral daily on Tuesday, Thursday, Saturday, Sunday  08/12/20  Yes Gonfa, Charlesetta Ivory, MD  levothyroxine (SYNTHROID, LEVOTHROID) 100 MCG tablet Take 100 mcg by mouth daily. 12/13/15  Yes [provider]  magnesium citrate SOLN Take 296 mLs (1 Bottle total) by mouth daily as needed for severe constipation. 08/10/20  Yes Mercy Riding, MD  metoprolol tartrate (LOPRESSOR) 100 MG tablet Take 100 mg by mouth 2 (two) times daily.   Yes [provider]  Multiple Vitamins-Minerals (ONE-A-DAY PROACTIVE 65+ PO) Take 1 tablet by mouth at bedtime.   Yes [provider]  MYRBETRIQ 50 MG TB24 tablet Take 50 mg by mouth every evening.  10/31/19  Yes [provider]  nitroGLYCERIN (NITROSTAT) 0.4 MG SL tablet Place 0.4 mg under the tongue every 5 (five) minutes as needed. For chest pain   Yes [provider]  OVER THE COUNTER MEDICATION 3 (three) times daily. MedPass 2.0   Yes [provider]  polyethylene glycol (MIRALAX / GLYCOLAX) 17 g packet Take 17 g by mouth 2 (two) times daily as needed for mild constipation. 08/10/20  Yes Mercy Riding, MD  senna-docusate (SENOKOT-S) 8.6-50 MG tablet Take 1 tablet by mouth 2 (two) times daily as needed for moderate constipation. 08/10/20  Yes Mercy Riding, MD  trimethoprim (TRIMPEX) 100 MG tablet Take 100 mg by mouth in the morning.   Yes [provider]  vitamin B-12 (CYANOCOBALAMIN) 1000 MCG tablet Take 1,000 mcg by mouth daily.   Yes [provider]  feeding supplement (ENSURE ENLIVE / ENSURE PLUS) LIQD Take 237 mLs by mouth 3 (three) times daily between meals. Patient not taking: No sig reported 08/11/20   Mercy Riding, MD    Allergies    Patient has no known allergies.  Review of Systems   Review of Systems  Constitutional:  Negative for appetite change.  Respiratory:  Negative for shortness of breath.   Cardiovascular:  Positive for near-syncope.  Gastrointestinal:  Positive for abdominal pain.  Musculoskeletal:  Negative for back pain.  Neurological:   Positive for light-headedness.  Psychiatric/Behavioral:  Negative for confusion.    Physical Exam Updated Vital Signs BP (!) 133/97   Pulse (!) 122   Temp 98.1 F (36.7 C) (Oral)   Resp (!) 22   Ht '5\' 5"'$  (1.651 m)   Wt 49.9 kg   SpO2 99%   BMI 18.30 kg/m   Physical Exam Vitals and nursing note reviewed.  HENT:     Head: Normocephalic.  Eyes:     Pupils: Pupils are equal, round, and reactive to light.  Cardiovascular:     Rate and Rhythm: Normal rate.  Abdominal:     Tenderness: There is abdominal tenderness.     Comments: Moderate  right-sided tenderness.  No hernia palpated.  Musculoskeletal:        General: No tenderness.     Cervical back: Neck supple.  Skin:    General: Skin is warm.     Capillary Refill: Capillary refill takes less than 2 seconds.  Neurological:     Mental Status: She is alert.     Comments: Awake and pleasant, but may have some mild confusion    ED Results / Procedures / Treatments   Labs (all labs ordered are listed, but only abnormal results are displayed) Labs Reviewed  COMPREHENSIVE METABOLIC PANEL - Abnormal; Notable for the following components:      Result Value   Sodium 133 (*)    Chloride 96 (*)    Glucose, Bld 128 (*)    BUN 33 (*)    Creatinine, Ser 1.47 (*)    Albumin 3.4 (*)    GFR, Estimated 32 (*)    All other components within normal limits  CBC WITH DIFFERENTIAL/PLATELET - Abnormal; Notable for the following components:   WBC 16.9 (*)    Neutro Abs 14.0 (*)    All other components within normal limits  CBG MONITORING, ED - Abnormal; Notable for the following components:   Glucose-Capillary 139 (*)    All other components within normal limits  URINALYSIS, ROUTINE W REFLEX MICROSCOPIC  TROPONIN I (HIGH SENSITIVITY)  TROPONIN I (HIGH SENSITIVITY)    EKG None  Radiology CT Angio Abd/Pel W and/or Wo Contrast  Result Date: 11/26/2020 CLINICAL DATA:  Near syncope, acute mesenteric ischemia EXAM: CTA ABDOMEN AND  PELVIS WITH CONTRAST TECHNIQUE: Multidetector CT imaging of the abdomen and pelvis was performed using the standard protocol during bolus administration of intravenous contrast. Multiplanar reconstructed images and MIPs were obtained and reviewed to evaluate the vascular anatomy. CONTRAST:  81m OMNIPAQUE IOHEXOL 350 MG/ML SOLN COMPARISON:  05/26/2020 FINDINGS: VASCULAR Coronary calcifications. At least mild cardiomegaly with right atrial enlargement. Aorta: Extensive calcified atheromatous plaque. No aneurysm, dissection, or stenosis. Celiac: Calcified ostial plaque resulting in short segment mild stenosis, patent distally. SMA: Partially calcified plaque extending from the origin approximately 1.7 cm resulting in segmental stenosis of at least moderate severity, the distal vessel mildly atheromatous but patent with classic branch anatomy. Renals: Single left, with calcified ostial plaque over length of approximately 1 cm resulting in at least mild stenosis, patent distally. Single right, with calcified ostial plaque resulting in mild short-segment stenosis, patent distally. IMA: Patent, with short-segment origin stenosis related to aortic wall plaque, and atheromatous irregularity of indeterminate hemodynamic significance approximately 2 cm distally, with patent distal branch anatomy. Inflow: Moderate calcified plaque without high-grade stenosis. Mild tortuosity. Proximal Outflow: Calcified plaque at the left SFA origin resulting in stenosis of at least mild severity, atheromatous but patent in the visualized distal portion. Veins: Patent hepatic veins, portal vein, SVC, splenic vein, bilateral renal veins, iliac confluence and IVC. Review of the MIP images confirms the above findings. NON-VASCULAR Lower chest: No pleural or pericardial effusion. Hepatobiliary: Transient hepatic attenuation difference in arterial phase scanning, with no discrete lesion evident on venous phase. Calcified subcapsular lesion in  segment 7 stable since 02/14/2012. There is a tubular subhepatic process which appears confluent with the hepatic flexure of the colon and has been consistently present on prior studies, containing some gas and fluid , suggestive of gallbladder with cholecystocolonic fistula. There is new marked thickening of the wall of the presumed gallbladder, and mucosal enhancement. No calcified gallstones. Pancreas: Unremarkable. No pancreatic  ductal dilatation or surrounding inflammatory changes. Spleen: Normal in size without focal abnormality. Adrenals/Urinary Tract: Adrenal glands unremarkable. 2.2 cm probable cyst, upper pole left kidney, stable since 02/10/2020. No hydronephrosis. Urinary bladder is physiologically distended. Stomach/Bowel: Stomach is incompletely distended. Duodenal diverticulum. The small bowel is decompressed. Appendix not identified. 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 Apparent patulous segment of colon anteriorly at the level of the aortic bifurcation which is stable since 05/26/2020. Innumerable distal descending and sigmoid diverticula without regional inflammatory change. Lymphatic: No abdominal or pelvic adenopathy. Reproductive: Status post hysterectomy. No adnexal masses. Other: Bilateral pelvic phleboliths.  No ascites.  No   free air. Musculoskeletal: Multilevel spondylitic changes in the lumbar spine. Osteitis pubis. No acute fracture or worrisome bone lesion. IMPRESSION: 1. 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 Patent celiac axis and inferior mesenteric artery may provide adequate collateral circulation to the mesentery. 2. Left SFA origin stenosis of possible hemodynamic significance. Correlate with clinical symptomatology and ABIs. 3. 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 4. Descending and sigmoid diverticulosis. Electronically Signed    By: Lucrezia Europe M.D.   On: 11/26/2020 15:13    Procedures Procedures   Medications Ordered in ED Medications  piperacillin-tazobactam (ZOSYN) IVPB 3.375 g (has no administration in time range)  iohexol (OMNIPAQUE) 350 MG/ML injection 65 mL (65 mLs Intravenous Contrast Given 11/26/20 1423)    ED Course  I have reviewed the triage vital signs and the nursing notes.  Pertinent labs & imaging results that were available during my care of the patient were reviewed by me and considered in my medical decision making (see chart for details).    MDM Rules/Calculators/A&P                           Patient presented after near syncopal episode.  Patient without too many complaints but found to have right-sided tenderness on her abdomen.  With atrial fibrillation and anticoagulation CT angiography done due to possible mesenteric ischemia.  CT scan shows some possible stenosis but also worrisome is a suspected choledocho colonic fistula with some gallbladder thickening.  LFTs are reassuring.  White count is 16.9 but LFTs are not abnormal.  Will discuss with general surgery but will need likely medicine admission. Final Clinical Impression(s) / ED Diagnoses Final diagnoses:  Near syncope  Fistula    Rx / DC Orders ED Discharge Orders     None        Davonna Belling, MD 11/26/20 1530

## 2020-11-26 NOTE — Progress Notes (Addendum)
[name]  24-Jun-1920 WI:[id number]  Patient Care Team: Holland Commons, FNP as PCP - General (Internal Medicine) Stanford Breed Denice Bors, MD as PCP - Cardiology (Cardiology) Adria Dill Leonia Reader, [place] (Internal Medicine)    Called by emergency department for concern of possible fistula between gallbladder and colon.  87 year old woman.  No definite prior history of cholecystectomy.  Abdominal pain.  Multiple medical problems.  Had ultrasound 2008 with no gallbladder, CT scan February 2022 with no gallbladder.  Now however looks like there is a thickened cystic mass in the right upper quadrant that seems classic for gallbladder with a connection to the hepatic flexure.    It would be very atypical to get a cholecysto-colonic fistula.  Usually would prefer to go to the duodenum.  Another possibility could be a duodenal diverticulum.  I reviewed the films with Dr. Jarvis Newcomer with radiology.  Duodenum normal.  No definite duodenal diverticulum.  Reviewed with Dr. Zenia Resides.  We wonder if perhaps it could be just a fold the transverse colon.  Dr. 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  Discussed with my hepatobiliary pancreatic surgical partner,  Dr. Zenia Resides.  We both agree MRCP would be helpful to better delineate anatomy.  If she does have a fistula, standard of care is cholecystectomy with partial colonic resection.  However, in a 87 year old fragile patient, I would be hesitant to proceed with surgery unless there are hard indication such as perforation, obstruction, and palliated cholangitis, hemorrhage.  I do not sense any strong indications.  Formal surgery consult to follow  Patient Active Problem List   Diagnosis Date Noted   Abdominal pain 11/26/2020   Protein-calorie malnutrition, severe 08/11/2020   Near syncope 08/05/2020   Rapid atrial fibrillation ([place]) 05/26/2020   Acute pulmonary embolism ([place]) 02/10/2020   Chronic diastolic CHF  (congestive heart failure) ([place]) 02/10/2020   New onset atrial fibrillation ([place]) 02/10/2020   Lung nodule 02/10/2020   History of echocardiogram    GERD (gastroesophageal reflux disease)    Diverticulosis    Carotid bruit    IBS (irritable bowel syndrome)    Hypothyroidism    HTN (hypertension)    HLD (hyperlipidemia)    CAD (coronary artery disease)    Atypical chest pain 06/14/2012   Chest pain 02/12/2012   Fatigue 04/22/2011   CAROTID BRUIT 03/19/2009   HYPOTHYROIDISM 08/29/2008   HEMORRHOIDS 08/29/2008   GERD 08/29/2008   DIVERTICULOSIS, COLON 08/29/2008   HYPERLIPIDEMIA 07/04/2008   HYPERTENSION, BENIGN 07/04/2008   CAD, NATIVE VESSEL 07/04/2008   IRRITABLE BOWEL SYNDROME 07/04/2008    Past Medical History:  Diagnosis Date   CAD (coronary artery disease)    a. 07/2007 PCI/Promus DES to LAD;  b. 02/2010 PCI/Promus DES to dRCA.;  c. 12/2010 [facility] MV: EF 88%, no isch/infarct.;  d. 02/2012 Cath: LM nl, LAD 20p, patent mid stent, 60d, D1 sm 70ost, D2 sm, diff, LCX sm , OM1 diff 29m(no change), RCA 40ost, patent stent, PD/PL nl, EF 75% w/o rwma's.   Carotid bruit    Diverticulosis    GERD (gastroesophageal reflux disease)    Hemorrhoids    History of echocardiogram    a. 06/2012 Echo: EF nl, mild LVH.   HLD (hyperlipidemia)    HTN (hypertension)    Hypothyroidism    IBS (irritable bowel syndrome)     Past Surgical History:  Procedure Laterality Date   BACK SURGERY     CORONARY ANGIOPLASTY WITH STENT  PLACEMENT     hysterectomy - unknown type     LEFT HEART CATHETERIZATION WITH CORONARY ANGIOGRAM N/A 02/13/2012   Procedure: LEFT HEART CATHETERIZATION WITH CORONARY ANGIOGRAM;  Surgeon: Josue Hector, MD;  Location: [facility] CATH LAB;  Service: Cardiovascular;  Laterality: N/A;   RECTOCELE REPAIR      Social History   Socioeconomic History   Marital status: Widowed    Spouse name: Not on file   Number of children: 4   Years of education: Not on file   Highest education  level: Not on file  Occupational History   Occupation: retired  Tobacco Use   Smoking status: Never   Smokeless tobacco: Never  Vaping Use   Vaping Use: Never used  Substance and Sexual Activity   Alcohol use: No   Drug use: No   Sexual activity: Not on file  Other Topics Concern   Not on file  Social History Narrative   ** Merged History Encounter **       Lives in [place].   Social Determinants of Health   Financial Resource Strain: Not on file  Food Insecurity: Not on file  Transportation Needs: Not on file  Physical Activity: Not on file  Stress: Not on file  Social Connections: Not on file  Intimate Partner Violence: Not on file    Family History  Problem Relation Age of Onset   Myasthenia gravis Mother    Hip fracture Father    Heart disease Other        several uncles with CAD    No current facility-administered medications for this encounter.   Current Outpatient Medications  Medication Sig Dispense Refill   acetaminophen (TYLENOL) 500 MG tablet Take 1,000 mg by mouth every 8 (eight) hours as needed (for pain).     apixaban (ELIQUIS) 2.5 MG TABS tablet Take 1 tablet (2.5 mg total) by mouth 2 (two) times daily. 180 tablet 1   atorvastatin (LIPITOR) 10 MG tablet Take 1 tablet (10 mg total) by mouth daily. 30 tablet 0   diltiazem (CARDIZEM) 60 MG tablet Take 1 tablet (60 mg total) by mouth 3 (three) times daily.     furosemide (LASIX) 20 MG tablet Take 1 tablet (20 mg total) by mouth every Monday, Wednesday, and Friday. (Patient taking differently: Take 10 mg by mouth See admin instructions. '10mg'$  oral daily on Tuesday, Thursday, Saturday, Sunday) 30 tablet    levothyroxine (SYNTHROID, LEVOTHROID) 100 MCG tablet Take 100 mcg by mouth daily.     magnesium citrate SOLN Take 296 mLs (1 Bottle total) by mouth daily as needed for severe constipation. 195 mL    metoprolol tartrate (LOPRESSOR) 100 MG tablet Take 100 mg by mouth 2 (two) times daily.     Multiple  Vitamins-Minerals (ONE-A-DAY PROACTIVE 65+ PO) Take 1 tablet by mouth at bedtime.     MYRBETRIQ 50 MG TB24 tablet Take 50 mg by mouth every evening.      nitroGLYCERIN (NITROSTAT) 0.4 MG SL tablet Place 0.4 mg under the tongue every 5 (five) minutes as needed. For chest pain     OVER THE COUNTER MEDICATION 3 (three) times daily. MedPass 2.0     polyethylene glycol (MIRALAX / GLYCOLAX) 17 g packet Take 17 g by mouth 2 (two) times daily as needed for mild constipation. 14 each 0   senna-docusate (SENOKOT-S) 8.6-50 MG tablet Take 1 tablet by mouth 2 (two) times daily as needed for moderate constipation.     trimethoprim (TRIMPEX) 100  MG tablet Take 100 mg by mouth in the morning.     vitamin B-12 (CYANOCOBALAMIN) 1000 MCG tablet Take 1,000 mcg by mouth daily.     feeding supplement (ENSURE ENLIVE / ENSURE PLUS) LIQD Take 237 mLs by mouth 3 (three) times daily between meals. (Patient not taking: No sig reported) 237 mL 12     No Known Allergies  BP (!) 127/97 (BP Location: Right Arm)   Pulse 96   Temp 98.1 F (36.7 C) (Oral)   Resp 16   Ht '5\' 5"'$  (1.651 m)   Wt 49.9 kg   SpO2 94%   BMI 18.30 kg/m   CT Angio Abd/Pel W and/or Wo Contrast  Result Date: 11/26/2020 CLINICAL DATA:  Near syncope, acute mesenteric ischemia EXAM: CTA ABDOMEN AND PELVIS WITH CONTRAST TECHNIQUE: Multidetector CT imaging of the abdomen and pelvis was performed using the standard protocol during bolus administration of intravenous contrast. Multiplanar reconstructed images and MIPs were obtained and reviewed to evaluate the vascular anatomy. CONTRAST:  64m OMNIPAQUE IOHEXOL 350 MG/ML SOLN COMPARISON:  05/26/2020 FINDINGS: VASCULAR Coronary calcifications. At least mild cardiomegaly with right atrial enlargement. Aorta: Extensive calcified atheromatous plaque. No aneurysm, dissection, or stenosis. Celiac: Calcified ostial plaque resulting in short segment mild stenosis, patent distally. SMA: Partially calcified plaque  extending from the origin approximately 1.7 cm resulting in segmental stenosis of at least moderate severity, the distal vessel mildly atheromatous but patent with classic branch anatomy. Renals: Single left, with calcified ostial plaque over length of approximately 1 cm resulting in at least mild stenosis, patent distally. Single right, with calcified ostial plaque resulting in mild short-segment stenosis, patent distally. IMA: Patent, with short-segment origin stenosis related to aortic wall plaque, and atheromatous irregularity of indeterminate hemodynamic significance approximately 2 cm distally, with patent distal branch anatomy. Inflow: Moderate calcified plaque without high-grade stenosis. Mild tortuosity. Proximal Outflow: Calcified plaque at the left SFA origin resulting in stenosis of at least mild severity, atheromatous but patent in the visualized distal portion. Veins: Patent hepatic veins, portal vein, SVC, splenic vein, bilateral renal veins, iliac confluence and IVC. Review of the MIP images confirms the above findings. NON-VASCULAR Lower chest: No pleural or pericardial effusion. Hepatobiliary: Transient hepatic attenuation difference in arterial phase scanning, with no discrete lesion evident on venous phase. Calcified subcapsular lesion in segment 7 stable since 02/14/2012. There is a tubular subhepatic process which appears confluent with the hepatic flexure of the colon and has been consistently present on prior studies, containing some gas and fluid , suggestive of gallbladder with cholecystocolonic fistula. There is new marked thickening of the wall of the presumed gallbladder, and mucosal enhancement. No calcified gallstones. Pancreas: Unremarkable. No pancreatic ductal dilatation or surrounding inflammatory changes. Spleen: Normal in size without focal abnormality. Adrenals/Urinary Tract: Adrenal glands unremarkable. 2.2 cm probable cyst, upper pole left kidney, stable since 02/10/2020. No  hydronephrosis. Urinary bladder is physiologically distended. Stomach/Bowel: Stomach is incompletely distended. Duodenal diverticulum. The small bowel is decompressed. Appendix not identified. 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 Apparent patulous segment of colon anteriorly at the level of the aortic bifurcation which is stable since 05/26/2020. Innumerable distal descending and sigmoid diverticula without regional inflammatory change. Lymphatic: No abdominal or pelvic adenopathy. Reproductive: Status post hysterectomy. No adnexal masses. Other: Bilateral pelvic phleboliths.  No ascites.  No   free air. Musculoskeletal: Multilevel spondylitic changes in the lumbar spine. Osteitis pubis. No acute fracture or worrisome bone lesion. IMPRESSION: 1. 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  Patent celiac axis and inferior mesenteric artery may provide adequate collateral circulation to the mesentery. 2. Left SFA origin stenosis of possible hemodynamic significance. Correlate with clinical symptomatology and ABIs. 3. 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 4. Descending and sigmoid diverticulosis. Electronically Signed   By: Lucrezia Europe M.D.   On: 11/26/2020 15:13    Note:  This dictation was prepared with Dragon/digital dictation along with [employer]. Any transcriptional errors that result from this process are unintentional.   .Adin Hector, M.D., F.A.C.S. Gastrointestinal and Minimally Invasive Surgery [facility] [address], [place], [place] [address] [phone] Main / Paging  11/26/2020 4:51 PM

## 2020-11-26 NOTE — H&P (Signed)
History and Physical    [name] [id number] DOB: January 09, 1921 DOA: 11/26/2020  PCP: Holland Commons, FNP  Patient coming from: [place]  Chief Complaint: stomach pain  HPI: [name] is a 87 y.o. female with medical history significant of a fib, CKD3a,  HFpEF, CAD. Presenting with near syncope. Patient is a poor historian. History primarily from chart review. The patient has had intermittent, nondescript abdominal pain over the last couple of month. It seems to come at any time per her report. It simply hurts. This morning she apparently was having an episode of abdominal pain and felt weak. She did not loss consciousness. She was sent to the ED for evaluation of her weakness and abdominal pain.   ED Course: WBC 16.9. CTA of abdomen showed possible choleducocolonic fistula and possible acalculous cholecystitis. General surgery was consulted. TRH was called for admission.   Review of Systems:  Denies CP, dyspnea, palpitations, N/V, fever, HA. Reports poor appetite, weakness. Review of systems is otherwise negative for all not mentioned in HPI.   PMHx Past Medical History:  Diagnosis Date   CAD (coronary artery disease)    a. 07/2007 PCI/Promus DES to LAD;  b. 02/2010 PCI/Promus DES to dRCA.;  c. 12/2010 Lexi MV: EF 88%, no isch/infarct.;  d. 02/2012 Cath: LM nl, LAD 20p, patent mid stent, 60d, D1 sm 70ost, D2 sm, diff, LCX sm , OM1 diff 52m(no change), RCA 40ost, patent stent, PD/PL nl, EF 75% w/o rwma's.   Carotid bruit    Diverticulosis    GERD (gastroesophageal reflux disease)    Hemorrhoids    History of echocardiogram    a. 06/2012 Echo: EF nl, mild LVH.   HLD (hyperlipidemia)    HTN (hypertension)    Hypothyroidism    IBS (irritable bowel syndrome)     PSHx Past Surgical History:  Procedure Laterality Date   BACK SURGERY     CORONARY ANGIOPLASTY WITH STENT PLACEMENT     hysterectomy - unknown type     LEFT HEART CATHETERIZATION WITH  CORONARY ANGIOGRAM N/A 02/13/2012   Procedure: LEFT HEART CATHETERIZATION WITH CORONARY ANGIOGRAM;  Surgeon: PJosue Hector MD;  [facility];  Service: Cardiovascular;  Laterality: N/A;   RECTOCELE REPAIR      SocHx  reports that she has never smoked. She has never used smokeless tobacco. She reports that she does not drink alcohol and does not use drugs.  No Known Allergies  FamHx Family History  Problem Relation Age of Onset   Myasthenia gravis Mother    Hip fracture Father    Heart disease Other        several uncles with CAD    Prior to Admission medications   Medication Sig Start Date End Date Taking? Authorizing Provider  acetaminophen (TYLENOL) 500 MG tablet Take 1,000 mg by mouth every 8 (eight) hours as needed (for pain).   Yes [provider]  apixaban (ELIQUIS) 2.5 MG TABS tablet Take 1 tablet (2.5 mg total) by mouth 2 (two) times daily. 08/10/20  Yes GMercy Riding MD  atorvastatin (LIPITOR) 10 MG tablet Take 1 tablet (10 mg total) by mouth daily. 02/19/20  Yes AAline August MD  diltiazem (CARDIZEM) 60 MG tablet Take 1 tablet (60 mg total) by mouth 3 (three) times daily. 05/28/20  Yes AShelly Coss MD  furosemide (LASIX) 20 MG tablet Take 1 tablet (20 mg total) by mouth every Monday, Wednesday, and Friday. Patient taking differently:  Take 10 mg by mouth See admin instructions. '10mg'$  oral daily on Tuesday, Thursday, Saturday, Sunday 08/12/20  Yes Gonfa, Charlesetta Ivory, MD  levothyroxine (SYNTHROID, LEVOTHROID) 100 MCG tablet Take 100 mcg by mouth daily. 12/13/15  Yes [provider]  magnesium citrate SOLN Take 296 mLs (1 Bottle total) by mouth daily as needed for severe constipation. 08/10/20  Yes Mercy Riding, MD  metoprolol tartrate (LOPRESSOR) 100 MG tablet Take 100 mg by mouth 2 (two) times daily.   Yes [provider]  Multiple Vitamins-Minerals (ONE-A-DAY PROACTIVE 65+ PO) Take 1 tablet by mouth at bedtime.   Yes [provider]   MYRBETRIQ 50 MG TB24 tablet Take 50 mg by mouth every evening.  10/31/19  Yes [provider]  nitroGLYCERIN (NITROSTAT) 0.4 MG SL tablet Place 0.4 mg under the tongue every 5 (five) minutes as needed. For chest pain   Yes [provider]  OVER THE COUNTER MEDICATION 3 (three) times daily. MedPass 2.0   Yes [provider]  polyethylene glycol (MIRALAX / GLYCOLAX) 17 g packet Take 17 g by mouth 2 (two) times daily as needed for mild constipation. 08/10/20  Yes Mercy Riding, MD  senna-docusate (SENOKOT-S) 8.6-50 MG tablet Take 1 tablet by mouth 2 (two) times daily as needed for moderate constipation. 08/10/20  Yes Mercy Riding, MD  trimethoprim (TRIMPEX) 100 MG tablet Take 100 mg by mouth in the morning.   Yes [provider]  vitamin B-12 (CYANOCOBALAMIN) 1000 MCG tablet Take 1,000 mcg by mouth daily.   Yes [provider]  feeding supplement (ENSURE ENLIVE / ENSURE PLUS) LIQD Take 237 mLs by mouth 3 (three) times daily between meals. Patient not taking: No sig reported 08/11/20   Mercy Riding, MD    Physical Exam: Vitals:   11/26/20 1413 11/26/20 1500 11/26/20 1600 11/26/20 1601  BP: 123/73 (!) 133/97 (!) 127/97 (!) 127/97  Pulse: (!) 103 (!) 122 (!) 130 96  Resp: 17 (!) '22 19 16  '$ Temp:      TempSrc:      SpO2: 96% 99% 96% 94%  Weight:      Height:        General: 87 y.o. female resting in bed in NAD Eyes: PERRL, normal sclera ENMT: Nares patent w/o discharge, orophaynx clear, dentition normal, ears w/o discharge/lesions/ulcers Neck: Supple, trachea midline Cardiovascular: RRR, +S1, S2, no m/g/r, equal pulses throughout Respiratory: CTABL, no w/r/r, normal WOB GI: BS hypoactive, ND, TTP RUQ/RLQ/LUQ, no masses noted, no organomegaly noted MSK: No e/c/c Skin: No rashes, bruises, ulcerations noted Neuro: A&O x name, place, president, no focal deficits Psyc: Appropriate interaction/affect but somewhat confused, calm/cooperative  Labs on  Admission: I have personally reviewed following labs and imaging studies  CBC: Recent Labs  Lab 11/26/20 1223  WBC 16.9*  NEUTROABS 14.0*  HGB 14.7  HCT 44.8  MCV 98.2  PLT [id number]   Basic Metabolic Panel: Recent Labs  Lab 11/26/20 1223  NA 133*  K 4.2  CL 96*  CO2 29  GLUCOSE 128*  BUN 33*  CREATININE 1.47*  CALCIUM 9.0   GFR: Estimated Creatinine Clearance: 16.8 mL/min (A) (by C-G formula based on SCr of 1.47 mg/dL (H)). Liver Function Tests: Recent Labs  Lab 11/26/20 1223  AST 26  ALT 16  ALKPHOS 71  BILITOT 0.9  PROT 7.3  ALBUMIN 3.4*   No results for input(s): LIPASE, AMYLASE in the last 168 hours. No results for input(s): AMMONIA in the  last 168 hours. Coagulation Profile: No results for input(s): INR, PROTIME in the last 168 hours. Cardiac Enzymes: No results for input(s): CKTOTAL, CKMB, CKMBINDEX, TROPONINI in the last 168 hours. BNP (last 3 results) No results for input(s): PROBNP in the last 8760 hours. HbA1C: No results for input(s): HGBA1C in the last 72 hours. CBG: Recent Labs  Lab 11/26/20 1159  GLUCAP 139*   Lipid Profile: No results for input(s): CHOL, HDL, LDLCALC, TRIG, CHOLHDL, LDLDIRECT in the last 72 hours. Thyroid Function Tests: No results for input(s): TSH, T4TOTAL, FREET4, T3FREE, THYROIDAB in the last 72 hours. Anemia Panel: No results for input(s): VITAMINB12, FOLATE, FERRITIN, TIBC, IRON, RETICCTPCT in the last 72 hours. Urine analysis:    Component Value Date/Time   COLORURINE YELLOW 08/07/2020 [place] 08/07/2020 0614   LABSPEC 1.020 08/07/2020 0614   PHURINE 5.0 08/07/2020 0614   GLUCOSEU NEGATIVE 08/07/2020 0614   HGBUR NEGATIVE 08/07/2020 [place] 08/07/2020 0614   BILIRUBINUR negative 02/10/2020 [place] 08/07/2020 0614   PROTEINUR NEGATIVE 08/07/2020 0614   UROBILINOGEN 1.0 02/10/2020 1608   UROBILINOGEN 0.2 02/13/2012 0221   NITRITE NEGATIVE 08/07/2020 0614    LEUKOCYTESUR NEGATIVE 08/07/2020 [id number]    Radiological Exams on Admission: CT Angio Abd/Pel W and/or Wo Contrast  Result Date: 11/26/2020 CLINICAL DATA:  Near syncope, acute mesenteric ischemia EXAM: CTA ABDOMEN AND PELVIS WITH CONTRAST TECHNIQUE: Multidetector CT imaging of the abdomen and pelvis was performed using the standard protocol during bolus administration of intravenous contrast. Multiplanar reconstructed images and MIPs were obtained and reviewed to evaluate the vascular anatomy. CONTRAST:  42m OMNIPAQUE IOHEXOL 350 MG/ML SOLN COMPARISON:  05/26/2020 FINDINGS: VASCULAR Coronary calcifications. At least mild cardiomegaly with right atrial enlargement. Aorta: Extensive calcified atheromatous plaque. No aneurysm, dissection, or stenosis. Celiac: Calcified ostial plaque resulting in short segment mild stenosis, patent distally. SMA: Partially calcified plaque extending from the origin approximately 1.7 cm resulting in segmental stenosis of at least moderate severity, the distal vessel mildly atheromatous but patent with classic branch anatomy. Renals: Single left, with calcified ostial plaque over length of approximately 1 cm resulting in at least mild stenosis, patent distally. Single right, with calcified ostial plaque resulting in mild short-segment stenosis, patent distally. IMA: Patent, with short-segment origin stenosis related to aortic wall plaque, and atheromatous irregularity of indeterminate hemodynamic significance approximately 2 cm distally, with patent distal branch anatomy. Inflow: Moderate calcified plaque without high-grade stenosis. Mild tortuosity. Proximal Outflow: Calcified plaque at the left SFA origin resulting in stenosis of at least mild severity, atheromatous but patent in the visualized distal portion. Veins: Patent hepatic veins, portal vein, SVC, splenic vein, bilateral renal veins, iliac confluence and IVC. Review of the MIP images confirms the above findings.  NON-VASCULAR Lower chest: No pleural or pericardial effusion. Hepatobiliary: Transient hepatic attenuation difference in arterial phase scanning, with no discrete lesion evident on venous phase. Calcified subcapsular lesion in segment 7 stable since 02/14/2012. There is a tubular subhepatic process which appears confluent with the hepatic flexure of the colon and has been consistently present on prior studies, containing some gas and fluid , suggestive of gallbladder with cholecystocolonic fistula. There is new marked thickening of the wall of the presumed gallbladder, and mucosal enhancement. No calcified gallstones. Pancreas: Unremarkable. No pancreatic ductal dilatation or surrounding inflammatory changes. Spleen: Normal in size without focal abnormality. Adrenals/Urinary Tract: Adrenal glands unremarkable. 2.2 cm probable cyst, upper pole left kidney, stable since 02/10/2020. No hydronephrosis.  Urinary bladder is physiologically distended. Stomach/Bowel: Stomach is incompletely distended. Duodenal diverticulum. The small bowel is decompressed. Appendix not identified. 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 Apparent patulous segment of colon anteriorly at the level of the aortic bifurcation which is stable since 05/26/2020. Innumerable distal descending and sigmoid diverticula without regional inflammatory change. Lymphatic: No abdominal or pelvic adenopathy. Reproductive: Status post hysterectomy. No adnexal masses. Other: Bilateral pelvic phleboliths.  No ascites.  No   free air. Musculoskeletal: Multilevel spondylitic changes in the lumbar spine. Osteitis pubis. No acute fracture or worrisome bone lesion. IMPRESSION: 1. 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 Patent celiac axis and inferior mesenteric artery may provide adequate collateral circulation to the mesentery. 2. Left SFA origin stenosis of possible hemodynamic significance. Correlate with clinical  symptomatology and ABIs. 3. 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 4. Descending and sigmoid diverticulosis. Electronically Signed   By: Lucrezia Europe M.D.   On: 11/26/2020 15:13    EKG: Independently reviewed. A fib, no st elevations  Assessment/Plan Abdominal pain Choledochocolonic fistula Acalculous cholecystitis     - admit to inpt, progressive     - zosyn, fluids     - gen surg consulted, appreciate assistance; MRCP ordered     - can start CLD after imaging if no surgical intervention planned tonight     - will hold eliquis for now and start heparin gtt in case surgical intervention required     - palliative care consult     - follow lactic acid  A fib     - continue home regimen w/ exception of eliquis     - start heparin gtt  Near syncope     - likely secondary to above     - fluids, abx, a fib control, follow  AKI on CKD3a     - fluids; renal US, follow  Proximal SMA stenosis     - as seen on CTA; question significance here     - let's see the MRCP and what needs to be addressed w/ the gallbladder first     - continue statin  Left SFA origin stenosis     - does not c/o claudication/leg pain     - can hold on ABI for now  HLD     - continue statin  Chronic HFpEF     - volume status is on the dry side; will get fluids for now, watch daily wt and I&O     - continue BB  DVT prophylaxis: heparin gtt  Code Status: DNR  Family Communication: Spoke w/ son Berta Minarik) by phone -- confirmed DNR status, attempted call to Riv[facility] Spring (primary caregiver), but unable to reach  Consults called: EDP spoke with general surgery   Status is: Inpatient  Remains inpatient appropriate because:Inpatient level of care appropriate due to severity of illness  Dispo: The patient is from: ALF              Anticipated d/c is to: ALF              Patient currently is not medically  stable to d/c.   Difficult to place patient No  Time spent coordinating admission: 60 minutes  [facility]  If 7PM-7AM, please contact night-coverage [email or web address]  11/26/2020, 4:25 PM

## 2020-11-26 NOTE — ED Triage Notes (Signed)
Pt arrives via [facility] for near syncopal episode at [facility].  Pt does not remember episode fully. Staff reported pt did not fall and no LOC.    EMS last VS - 109/68, HR 68, 96% on RA, CBG 140

## 2020-11-27 DIAGNOSIS — K659 Peritonitis, unspecified: Secondary | ICD-10-CM

## 2020-11-27 DIAGNOSIS — Z7189 Other specified counseling: Secondary | ICD-10-CM

## 2020-11-27 DIAGNOSIS — Z515 Encounter for palliative care: Secondary | ICD-10-CM

## 2020-11-27 DIAGNOSIS — R531 Weakness: Secondary | ICD-10-CM

## 2020-11-27 DIAGNOSIS — R1084 Generalized abdominal pain: Secondary | ICD-10-CM

## 2020-11-27 LAB — CBC
HCT: 44.9 % (ref 36.0–46.0)
Hemoglobin: 14.7 g/dL (ref 12.0–15.0)
MCH: 32.1 pg (ref 26.0–34.0)
MCHC: 32.7 g/dL (ref 30.0–36.0)
MCV: 98 fL (ref 80.0–100.0)
Platelets: 178 10*3/uL (ref 150–400)
RBC: 4.58 MIL/uL (ref 3.87–5.11)
RDW: 13.3 % (ref 11.5–15.5)
WBC: 13.6 10*3/uL — ABNORMAL HIGH (ref 4.0–10.5)
nRBC: 0 % (ref 0.0–0.2)

## 2020-11-27 LAB — APTT
aPTT: 82 seconds — ABNORMAL HIGH (ref 24–36)
aPTT: 102 seconds — ABNORMAL HIGH (ref 24–36)

## 2020-11-27 LAB — HEPARIN LEVEL (UNFRACTIONATED): Heparin Unfractionated: >1.1 IU/mL — ABNORMAL HIGH (ref 0.30–0.70)

## 2020-11-27 LAB — PROCALCITONIN: Procalcitonin: 1.89 ng/mL

## 2020-11-27 LAB — PROTIME-INR
INR: 1.3 — ABNORMAL HIGH (ref 0.8–1.2)
Prothrombin Time: 16 seconds — ABNORMAL HIGH (ref 11.4–15.2)

## 2020-11-27 LAB — COMPREHENSIVE METABOLIC PANEL
ALT: 12 U/L (ref 0–44)
AST: 24 U/L (ref 15–41)
Albumin: 3.1 g/dL — ABNORMAL LOW (ref 3.5–5.0)
Alkaline Phosphatase: 67 U/L (ref 38–126)
Anion gap: 8 (ref 5–15)
BUN: 32 mg/dL — ABNORMAL HIGH (ref 8–23)
CO2: 29 mmol/L (ref 22–32)
Calcium: 8.9 mg/dL (ref 8.9–10.3)
Chloride: 98 mmol/L (ref 98–111)
Creatinine, Ser: 1.54 mg/dL — ABNORMAL HIGH (ref 0.44–1.00)
GFR, Estimated: 30 mL/min — ABNORMAL LOW (ref >60)
Glucose, Bld: 94 mg/dL (ref 70–99)
Potassium: 4.6 mmol/L (ref 3.5–5.1)
Sodium: 135 mmol/L (ref 135–145)
Total Bilirubin: 1.2 mg/dL (ref 0.3–1.2)
Total Protein: 6.9 g/dL (ref 6.5–8.1)

## 2020-11-27 LAB — CORTISOL-AM, BLOOD: Cortisol - AM: 28.6 ug/dL — ABNORMAL HIGH (ref 6.7–22.6)

## 2020-11-27 MED ORDER — DILTIAZEM HCL 30 MG PO TABS
30.0000 mg | ORAL_TABLET | Freq: Three times a day (TID) | ORAL | Status: DC
  Administered 2020-11-27 – 2020-12-03 (×18): 30 mg via ORAL
  Filled 2020-11-27 (×18): qty 1

## 2020-11-27 NOTE — Progress Notes (Signed)
[place] for heparin  Indication: atrial fibrillation - bridge therapy while apixaban on hold  No Known Allergies  Patient Measurements: Height: '5\' 5"'$  (165.1 cm) Weight: 49.9 kg (110 lb) IBW/kg (Calculated) : 57 Heparin Dosing Weight: 49.9 kg   Vital Signs: BP: 141/73 (08/19 1400) Pulse Rate: 60 (08/19 1400)  Labs: Recent Labs    11/26/20 1223 11/26/20 1437 11/26/20 1800 11/27/20 0418 11/27/20 1150  HGB 14.7  --   --  14.7  --   HCT 44.8  --   --  44.9  --   PLT 165  --   --  178  --   APTT  --   --  42* 82* 102*  LABPROT  --   --   --  16.0*  --   INR  --   --   --  1.3*  --   HEPARINUNFRC  --   --  >1.10* >1.10*  --   CREATININE 1.47*  --   --  1.54*  --   TROPONINIHS 9 8  --   --   --      Estimated Creatinine Clearance: 16.1 mL/min (A) (by C-G formula based on SCr of 1.54 mg/dL (H)).   Medical History: Past Medical History:  Diagnosis Date   CAD (coronary artery disease)    a. 07/2007 PCI/Promus DES to LAD;  b. 02/2010 PCI/Promus DES to dRCA.;  c. 12/2010 Lexi MV: EF 88%, no isch/infarct.;  d. 02/2012 Cath: LM nl, LAD 20p, patent mid stent, 60d, D1 sm 70ost, D2 sm, diff, LCX sm , OM1 diff 70m(no change), RCA 40ost, patent stent, PD/PL nl, EF 75% w/o rwma's.   Carotid bruit    Diverticulosis    GERD (gastroesophageal reflux disease)    Hemorrhoids    History of echocardiogram    a. 06/2012 Echo: EF nl, mild LVH.   HLD (hyperlipidemia)    HTN (hypertension)    Hypothyroidism    IBS (irritable bowel syndrome)     Medications:  Apixaban 2.5 mg po BID, last dose 8/18 at 08 am  Assessment: 87yo F on apixaban 2.5 mg po BID PTA for Afib.  Pharmacy consulted to dose heparin for bridge therapy while apixaban on hold during work up of possible fistula between gallbladder and colon.  Apixa 2.5 po bid last dose 8/18 at 08 am CBC WNL, SCr 1.47  11/27/2020: aPTT 102 sec - therapeutic on heparin 700 units/hr however at high  end of goal range and increasing Heparin level >1.1 as anticipated due to recent apixaban CBC WNL No bleeding or infusion issues per RN  Goal of Therapy:  Heparin level 0.3-0.7 units/ml aPTT 66 - 102 seconds Monitor platelets by anticoagulation protocol: Yes   Plan:  Decrease heparin infusion slightly to 650 units/hr CBC, aPTT & heparin level Will adjust heparin using aPTT until apixaban has cleared.  CUlice DashD  11/27/2020 2:18 PM

## 2020-11-27 NOTE — Progress Notes (Signed)
[place] for heparin  Indication: atrial fibrillation - bridge therapy while apixaban on hold  No Known Allergies  Patient Measurements: Height: '5\' 5"'$  (165.1 cm) Weight: 49.9 kg (110 lb) IBW/kg (Calculated) : 57 Heparin Dosing Weight: 49.9 kg   Vital Signs: BP: 103/72 (08/19 0400) Pulse Rate: 61 (08/19 0400)  Labs: Recent Labs    11/26/20 1223 11/26/20 1437 11/26/20 1800 11/27/20 0418  HGB 14.7  --   --  14.7  HCT 44.8  --   --  44.9  PLT 165  --   --  178  APTT  --   --  42* 82*  LABPROT  --   --   --  16.0*  INR  --   --   --  1.3*  HEPARINUNFRC  --   --  >1.10* >1.10*  CREATININE 1.47*  --   --  1.54*  TROPONINIHS 9 8  --   --      Estimated Creatinine Clearance: 16.1 mL/min (A) (by C-G formula based on SCr of 1.54 mg/dL (H)).   Medical History: Past Medical History:  Diagnosis Date   CAD (coronary artery disease)    a. 07/2007 PCI/Promus DES to LAD;  b. 02/2010 PCI/Promus DES to dRCA.;  c. 12/2010 Lexi MV: EF 88%, no isch/infarct.;  d. 02/2012 Cath: LM nl, LAD 20p, patent mid stent, 60d, D1 sm 70ost, D2 sm, diff, LCX sm , OM1 diff 89m(no change), RCA 40ost, patent stent, PD/PL nl, EF 75% w/o rwma's.   Carotid bruit    Diverticulosis    GERD (gastroesophageal reflux disease)    Hemorrhoids    History of echocardiogram    a. 06/2012 Echo: EF nl, mild LVH.   HLD (hyperlipidemia)    HTN (hypertension)    Hypothyroidism    IBS (irritable bowel syndrome)     Medications:  Apixaban 2.5 mg po BID, last dose 8/18 at 08 am  Assessment: 87yo F on apixaban 2.5 mg po BID PTA for Afib.  Pharmacy consulted to dose heparin for bridge therapy while apixaban on hold during work up of possible fistula between gallbladder and colon.  Apixa 2.5 po bid last dose 8/18 at 08 am CBC WNL, SCr 1.47  11/27/2020: aPTT 82 sec - therapeutic on heparin 700 units/hr Heparin level >1.1 as anticipated due to recent apixaban CBC WNL No bleeding  or infusion issues per RN  Goal of Therapy:  Heparin level 0.3-0.7 units/ml aPTT 66 - 102 seconds Monitor platelets by anticoagulation protocol: Yes   Plan:  Continue IV heparin at 700 units/hr  Recheck 8 hr aPTT  Daily CBC, aPTT & heparin level Will adjust heparin using aPTT until apixaban has cleared.  MNetta Cedars Pharm.D 11/27/2020 6:10 AM

## 2020-11-27 NOTE — Consult Note (Signed)
Consultation Note Date: 11/27/2020   Patient Name: [name]  DOB: Dec 23, 1920  MRN: [id number]  Age / Sex: 87 y.o., female  PCP: [facility], [place] Referring Physician: [place] Eric J, DO  Reason for Consultation: Establishing goals of care  HPI/Patient Profile: 87 y.o. female  admitted on 11/26/2020    Clinical Assessment and Goals of Care: 87 year old lady, known to palliative medicine service seen in a previous hospitalization, has advance care planning documents with a MOST form in [place], past medical history of paroxysmal atrial fibrillation for which she is on anticoagulation with Eliquis, chronic kidney disease stage IIIa, chronic diastolic congestive heart failure and coronary artery disease.  Patient resides at [facility] assisted living facility and was brought in for near syncopal episode.  Patient was found to have a acalculous cholecystitis and a possible fistula on abdominal CT scan.  General surgery was consulted patient was placed on IV antibiotics and admitted to hospital medicine service.  Palliative consultation for additional support and ongoing goals of care discussions has been requested.  Patient is awake alert resting in bed.  She has been extra covers asked for pajama pants and states that she feels cold.  She is able to answer all questions appropriately.  Denies any nausea vomiting denies any acute abdominal discomfort currently.  Does not appear toxic.  I introduced myself and palliative care as follows: Palliative medicine is specialized medical care for people living with serious illness. It focuses on providing relief from the symptoms and stress of a serious illness. The goal is to improve quality of life for both the patient and the family. Goals of care: Broad aims of medical therapy in relation to the patient's values and preferences. Our aim is to provide medical care  aimed at enabling patients to achieve the goals that matter most to them, given the circumstances of their particular medical situation and their constraints.   Chart reviewed, medical plan noted.  Agree with current mode of care, palliative medicine will follow along peripherally.  Working diagnosis remains acalculous cholecystitis with current plan being to advance diet gradually, continue 5-day course of antibiotics and to consider additional interventions such as a HIDA scan if need be.  Call placed, unable to reach her son Lashina Bosher at this time.  HCPOA Several family members are listed in the demographics section.  MOST form also reviewed.  SUMMARY OF RECOMMENDATIONS   Agree with DNR MOST form reviewed Continue current mode of care Antibiotics Diet to be advanced gradually PMT to follow along and assist with broad goals of care decisions. Thank you for the consult.  Code Status/Advance Care Planning: DNR   Symptom Management:    Palliative Prophylaxis:  Delirium Protocol  Psycho-social/Spiritual:  Desire for further Chaplaincy support:yes Additional Recommendations: Caregiving  Support/Resources  Prognosis:  Unable to determine  Discharge Planning: To Be Determined      Primary Diagnoses: Present on Admission:  Abdominal pain  Acute acalculous cholecystitis  Abdominal infection ([place])   I have reviewed the medical  record, interviewed the patient and family, and examined the patient. The following aspects are pertinent.  Past Medical History:  Diagnosis Date   CAD (coronary artery disease)    a. 07/2007 PCI/Promus DES to LAD;  b. 02/2010 PCI/Promus DES to dRCA.;  c. 12/2010 Lexi MV: EF 88%, no isch/infarct.;  d. 02/2012 Cath: LM nl, LAD 20p, patent mid stent, 60d, D1 sm 70ost, D2 sm, diff, LCX sm , OM1 diff 56m(no change), RCA 40ost, patent stent, PD/PL nl, EF 75% w/o rwma's.   Carotid bruit    Diverticulosis    GERD (gastroesophageal reflux disease)     Hemorrhoids    History of echocardiogram    a. 06/2012 Echo: EF nl, mild LVH.   HLD (hyperlipidemia)    HTN (hypertension)    Hypothyroidism    IBS (irritable bowel syndrome)    Social History   Socioeconomic History   Marital status: Widowed    Spouse name: Not on file   Number of children: 4   Years of education: Not on file   Highest education level: Not on file  Occupational History   Occupation: retired  Tobacco Use   Smoking status: Never   Smokeless tobacco: Never  Vaping Use   Vaping Use: Never used  Substance and Sexual Activity   Alcohol use: No   Drug use: No   Sexual activity: Not on file  Other Topics Concern   Not on file  Social History Narrative   ** Merged History Encounter **       Lives in [place]   Social Determinants of Health   Financial Resource Strain: Not on file  Food Insecurity: Not on file  Transportation Needs: Not on file  Physical Activity: Not on file  Stress: Not on file  Social Connections: Not on file   Family History  Problem Relation Age of Onset   Myasthenia gravis Mother    Hip fracture Father    Heart disease Other        several uncles with CAD   Scheduled Meds:  atorvastatin  10 mg Oral QHS   diltiazem  30 mg Oral TID   levothyroxine  100 mcg Oral Daily   metoprolol tartrate  100 mg Oral BID   mirabegron ER  50 mg Oral QHS   vitamin B-12  1,000 mcg Oral Daily   Continuous Infusions:  sodium chloride 100 mL/hr at 11/26/20 1759   heparin 650 Units/hr (11/27/20 1425)   piperacillin-tazobactam (ZOSYN)  IV 2.25 g (11/27/20 1436)   PRN Meds:.acetaminophen **OR** acetaminophen, HYDROcodone-acetaminophen, ondansetron **OR** ondansetron (ZOFRAN) IV Medications Prior to Admission:  Prior to Admission medications   Medication Sig Start Date End Date Taking? Authorizing Provider  acetaminophen (TYLENOL) 500 MG tablet Take 1,000 mg by mouth every 8 (eight) hours as needed (for pain).   Yes [provider]  apixaban  (ELIQUIS) 2.5 MG TABS tablet Take 1 tablet (2.5 mg total) by mouth 2 (two) times daily. 08/10/20  Yes GMercy Riding MD  atorvastatin (LIPITOR) 10 MG tablet Take 1 tablet (10 mg total) by mouth daily. 02/19/20  Yes AAline August MD  diltiazem (CARDIZEM) 60 MG tablet Take 1 tablet (60 mg total) by mouth 3 (three) times daily. 05/28/20  Yes AShelly Coss MD  furosemide (LASIX) 20 MG tablet Take 1 tablet (20 mg total) by mouth every Monday, Wednesday, and Friday. Patient taking differently: Take 10 mg by mouth See admin instructions. '10mg'$  oral daily on Tuesday, Thursday, Saturday, Sunday  08/12/20  Yes Mercy Riding, MD  levothyroxine (SYNTHROID, LEVOTHROID) 100 MCG tablet Take 100 mcg by mouth daily. 12/13/15  Yes [provider]  magnesium citrate SOLN Take 296 mLs (1 Bottle total) by mouth daily as needed for severe constipation. 08/10/20  Yes Mercy Riding, MD  metoprolol tartrate (LOPRESSOR) 100 MG tablet Take 100 mg by mouth 2 (two) times daily.   Yes [provider]  Multiple Vitamins-Minerals (ONE-A-DAY PROACTIVE 65+ PO) Take 1 tablet by mouth at bedtime.   Yes [provider]  MYRBETRIQ 50 MG TB24 tablet Take 50 mg by mouth every evening.  10/31/19  Yes [provider]  nitroGLYCERIN (NITROSTAT) 0.4 MG SL tablet Place 0.4 mg under the tongue every 5 (five) minutes as needed. For chest pain   Yes [provider]  OVER THE COUNTER MEDICATION 3 (three) times daily. MedPass 2.0   Yes [provider]  polyethylene glycol (MIRALAX / GLYCOLAX) 17 g packet Take 17 g by mouth 2 (two) times daily as needed for mild constipation. 08/10/20  Yes Mercy Riding, MD  senna-docusate (SENOKOT-S) 8.6-50 MG tablet Take 1 tablet by mouth 2 (two) times daily as needed for moderate constipation. 08/10/20  Yes Mercy Riding, MD  trimethoprim (TRIMPEX) 100 MG tablet Take 100 mg by mouth in the morning.   Yes [provider]  vitamin B-12 (CYANOCOBALAMIN) 1000 MCG  tablet Take 1,000 mcg by mouth daily.   Yes [provider]  feeding supplement (ENSURE ENLIVE / ENSURE PLUS) LIQD Take 237 mLs by mouth 3 (three) times daily between meals. Patient not taking: No sig reported 08/11/20   Mercy Riding, MD   No Known Allergies Review of Systems Complains of feeling cold. Physical Exam Thin appearing weak elderly lady resting in bed Appears chronically ill No acute distress Regular work of breathing S1-S2 Abdomen has mild generalized tenderness Awake alert oriented no focal deficits Possible senile purpuric spots in extremities skin   Vital Signs: BP 99/63 (BP Location: Right Arm)   Pulse (!) 58   Temp (!) 97.5 F (36.4 C) (Oral)   Resp 18   Ht '5\' 5"'$  (1.651 m)   Wt 49.9 kg   SpO2 96%   BMI 18.30 kg/m  Pain Scale: 0-10   Pain Score: 0-No pain   SpO2: SpO2: 96 % O2 Device:SpO2: 96 % O2 Flow Rate: .   IO: Intake/output summary:  Intake/Output Summary (Last 24 hours) at 11/27/2020 1657 Last data filed at 11/27/2020 0558 Gross per 24 hour  Intake 89.11 ml  Output --  Net 89.11 ml    LBM:   Baseline Weight: Weight: 49.9 kg Most recent weight: Weight: 49.9 kg     Palliative Assessment/Data:   PPS 40%  Time In:  1600  Time Out:  1700 Time Total:  60 min.  Greater than 50%  of this time was spent counseling and coordinating care related to the above assessment and plan.  Signed by: Loistine Chance, MD   Please contact Palliative Medicine Team phone at [phone] for questions and concerns.  For individual provider: See Shea Evans

## 2020-11-27 NOTE — Progress Notes (Signed)
[name] [id number] 18-Nov-1920  CARE TEAM:  PCP: Holland Commons, [place]  Outpatient Care Team: Patient Care Team: Holland Commons, FNP as PCP - General (Internal Medicine) Stanford Breed Denice Bors, MD as PCP - Cardiology (Cardiology) Adria Dill Leonia Reader, [place] (Internal Medicine)  Inpatient Treatment Team: Treatment Team: Attending Provider: [place] Eric J, DO; Rounding Team: Edison Pace, Md, MD; Registered Nurse: Lillia Mountain, RN; Rounding Team: Ian Bushman, MD; Utilization Review: Lacretia Leigh, RN; Technician: Tobey Grim, EMT   Problem List:   Active Problems:   Abdominal pain   Acute acalculous cholecystitis      * No surgery found *      Assessment  Ab normal right upper quadrant mass.  Possible acalculous cholecystitis.  [facility] Stay = 1 days)  Plan:  -MRCP completed.  Not high-quality so no definite answer on if she truly has a cholecysto-colonic fistula but radiology less convinced.  Again seems unlikely.  At most a concern for a calculus cholecystitis could be raised.  She does not have a Murphy sign or severe pain.  She is not toxic.  Reasonable to do 5-day course of antibiotics and then observe.  She is tolerating liquids so I advance her diet gradually.  Discussed with primary service, Dr. [place]  I would not try to operate on her unless she has marked decline in evidence of peritonitis which seems unlikely.  Very high operative risk.  Primary service agrees.  Surgery will help follow more peripherally.  Reserve any more aggressive intervention unless she declines.  If she does, can consider HIDA scan nuclear medicine study and to see if she truly has cholecystitis.  If positive then percutaneous cholecystostomy tube first.  Surgical partners to follow over weekend  VTE prophylaxis- SCDs, etc  Mobilize as tolerated to help recovery  Disposition: Per primary service      20 minutes spent in review, evaluation, examination,  counseling, and coordination of care.   I have reviewed this patient's available data, including medical history, events of note, physical examination and test results as part of my evaluation.  A significant portion of that time was spent in counseling.  Care during the described time interval was provided by me.  11/27/2020    Subjective: (Chief complaint)  Patient remains in emergency room due to issues with beds.  Denies any abdominal pain.  No nausea or vomiting.  Tolerating liquids.  Objective:  Vital signs:  Vitals:   11/27/20 0045 11/27/20 0345 11/27/20 0400 11/27/20 0700  BP: (!) 90/56 103/64 103/72 108/82  Pulse: 70 (!) 129 61 (!) 52  Resp: 14 (!) '31 17 19  '$ Temp:      TempSrc:      SpO2: 91% 94% 93% 93%  Weight:      Height:           Intake/Output   Yesterday:  No intake/output data recorded. This shift:  No intake/output data recorded.  Bowel function:  Flatus: No  BM:  No  Drain: (No drain)   Physical Exam:  General: Pt awake/alert in no acute distress.  Elderly and somewhat frail but bright and alert. Eyes: PERRL, normal EOM.  Sclera clear.  No icterus Neuro: CN II-XII intact w/o focal sensory/motor deficits. Lymph: No head/neck/groin lymphadenopathy Psych:  No delerium/psychosis/paranoia.  Oriented x 3 HENT: Normocephalic, Mucus membranes moist.  No thrush Neck: Supple, No tracheal deviation.  No obvious thyromegaly Chest: No pain to chest wall compression.  Good respiratory excursion.  No  audible wheezing CV:  Pulses intact.  Regular rhythm.  No major extremity edema MS: Normal AROM mjr joints.  No obvious deformity  Abdomen: Soft.  Nondistended.  Nontender.  No evidence of peritonitis.  No incarcerated hernias.  Ext:   No deformity.  No mjr edema.  No cyanosis Skin: No petechiae / purpurea.  No major sores.  Warm and dry    Results:   Cultures: Recent Results (from the past 720 hour(s))  Resp Panel by RT-PCR (Flu A&B, Covid)  Nasopharyngeal Swab     Status: None   Collection Time: 11/26/20  4:40 PM   Specimen: Nasopharyngeal Swab; Nasopharyngeal(NP) swabs in vial transport medium  Result Value Ref Range Status   SARS Coronavirus 2 by RT PCR NEGATIVE NEGATIVE Final    Comment: (NOTE) SARS-CoV-2 target nucleic acids are NOT DETECTED.  The SARS-CoV-2 RNA is generally detectable in upper respiratory specimens during the acute phase of infection. The lowest concentration of SARS-CoV-2 viral copies this assay can detect is 138 copies/mL. A negative result does not preclude SARS-Cov-2 infection and should not be used as the sole basis for treatment or other patient management decisions. A negative result may occur with  improper specimen collection/handling, submission of specimen other than nasopharyngeal swab, presence of viral mutation(s) within the areas targeted by this assay, and inadequate number of viral copies(<138 copies/mL). A negative result must be combined with clinical observations, patient history, and epidemiological information. The expected result is Negative.  Fact Sheet for Patients:  EntrepreneurPulse.com.au  Fact Sheet for Healthcare Providers:  IncredibleEmployment.be  This test is no t yet approved or cleared by the [place] FDA and  has been authorized for detection and/or diagnosis of SARS-CoV-2 by FDA under an Emergency Use Authorization (EUA). This EUA will remain  in effect (meaning this test can be used) for the duration of the COVID-19 declaration under Section 564(b)(1) of the Act, 21 U.S.C.section 360bbb-3(b)(1), unless the authorization is terminated  or revoked sooner.       Influenza A by PCR NEGATIVE NEGATIVE Final   Influenza B by PCR NEGATIVE NEGATIVE Final    Comment: (NOTE) The Xpert Xpress SARS-CoV-2/FLU/RSV plus assay is intended as an aid in the diagnosis of influenza from Nasopharyngeal swab specimens and should not be  used as a sole basis for treatment. Nasal washings and aspirates are unacceptable for Xpert Xpress SARS-CoV-2/FLU/RSV testing.  Fact Sheet for Patients: EntrepreneurPulse.com.au  Fact Sheet for Healthcare Providers: IncredibleEmployment.be  This test is not yet approved or cleared by the [place] FDA and has been authorized for detection and/or diagnosis of SARS-CoV-2 by FDA under an Emergency Use Authorization (EUA). This EUA will remain in effect (meaning this test can be used) for the duration of the COVID-19 declaration under Section 564(b)(1) of the Act, 21 U.S.C. section 360bbb-3(b)(1), unless the authorization is terminated or revoked.  Performed at [facility], [place] [address] [place], [place] [address]     Labs: Results for orders placed or performed during the hospital encounter of 11/26/20 (from the past 48 hour(s))  CBG monitoring, ED     Status: Abnormal   Collection Time: 11/26/20 11:59 AM  Result Value Ref Range   Glucose-Capillary 139 (H) 70 - 99 mg/dL    Comment: Glucose reference range applies only to samples taken after fasting for at least 8 hours.  Comprehensive metabolic panel     Status: Abnormal   Collection Time: 11/26/20 12:23 PM  Result Value Ref Range  Sodium 133 (L) 135 - 145 mmol/L   Potassium 4.2 3.5 - 5.1 mmol/L   Chloride 96 (L) 98 - 111 mmol/L   CO2 29 22 - 32 mmol/L   Glucose, Bld 128 (H) 70 - 99 mg/dL    Comment: Glucose reference range applies only to samples taken after fasting for at least 8 hours.   BUN 33 (H) 8 - 23 mg/dL   Creatinine, Ser 1.47 (H) 0.44 - 1.00 mg/dL   Calcium 9.0 8.9 - 10.3 mg/dL   Total Protein 7.3 6.5 - 8.1 g/dL   Albumin 3.4 (L) 3.5 - 5.0 g/dL   AST 26 15 - 41 U/L   ALT 16 0 - 44 U/L   Alkaline Phosphatase 71 38 - 126 U/L   Total Bilirubin 0.9 0.3 - 1.2 mg/dL   GFR, Estimated 32 (L) >60 mL/min    Comment: (NOTE) Calculated using the CKD-EPI  Creatinine Equation (2021)    Anion gap 8 5 - 15    Comment: Performed at [facility], [place] [address], [place], [place] [address]  Troponin I (High Sensitivity)     Status: None   Collection Time: 11/26/20 12:23 PM  Result Value Ref Range   Troponin I (High Sensitivity) 9 <18 ng/L    Comment: (NOTE) Elevated high sensitivity troponin I (hsTnI) values and significant  changes across serial measurements may suggest ACS but many other  chronic and acute conditions are known to elevate hsTnI results.  Refer to the "Links" section for chest pain algorithms and additional  guidance. Performed at [facility], [place] [address], [place], [place] [address]   CBC with Differential     Status: Abnormal   Collection Time: 11/26/20 12:23 PM  Result Value Ref Range   WBC 16.9 (H) 4.0 - 10.5 K/uL   RBC 4.56 3.87 - 5.11 MIL/uL   Hemoglobin 14.7 12.0 - 15.0 g/dL   HCT 44.8 36.0 - 46.0 %   MCV 98.2 80.0 - 100.0 fL   MCH 32.2 26.0 - 34.0 pg   MCHC 32.8 30.0 - 36.0 g/dL   RDW 13.2 11.5 - 15.5 %   Platelets 165 150 - 400 K/uL   nRBC 0.0 0.0 - 0.2 %   Neutrophils Relative % 83 %   Neutro Abs 14.0 (H) 1.7 - 7.7 K/uL   Lymphocytes Relative 11 %   Lymphs Abs 1.9 0.7 - 4.0 K/uL   Monocytes Relative 5 %   Monocytes Absolute 0.8 0.1 - 1.0 K/uL   Eosinophils Relative 1 %   Eosinophils Absolute 0.1 0.0 - 0.5 K/uL   Basophils Relative 0 %   Basophils Absolute 0.0 0.0 - 0.1 K/uL   Immature Granulocytes 0 %   Abs Immature Granulocytes 0.06 0.00 - 0.07 K/uL    Comment: Performed at [facility] [place] [address] [place], [place] [address]  Troponin I (High Sensitivity)     Status: None   Collection Time: 11/26/20  2:37 PM  Result Value Ref Range   Troponin I (High Sensitivity) 8 <18 ng/L    Comment: (NOTE) Elevated high sensitivity troponin I (hsTnI) values and significant  changes across serial measurements may suggest ACS but many other   chronic and acute conditions are known to elevate hsTnI results.  Refer to the "Links" section for chest pain algorithms and additional  guidance. Performed at [facility], [place] [address] [place], [place] [address]   Resp Panel by RT-PCR (Flu  A&B, Covid) Nasopharyngeal Swab     Status: None   Collection Time: 11/26/20  4:40 PM   Specimen: Nasopharyngeal Swab; Nasopharyngeal(NP) swabs in vial transport medium  Result Value Ref Range   SARS Coronavirus 2 by RT PCR NEGATIVE NEGATIVE    Comment: (NOTE) SARS-CoV-2 target nucleic acids are NOT DETECTED.  The SARS-CoV-2 RNA is generally detectable in upper respiratory specimens during the acute phase of infection. The lowest concentration of SARS-CoV-2 viral copies this assay can detect is 138 copies/mL. A negative result does not preclude SARS-Cov-2 infection and should not be used as the sole basis for treatment or other patient management decisions. A negative result may occur with  improper specimen collection/handling, submission of specimen other than nasopharyngeal swab, presence of viral mutation(s) within the areas targeted by this assay, and inadequate number of viral copies(<138 copies/mL). A negative result must be combined with clinical observations, patient history, and epidemiological information. The expected result is Negative.  Fact Sheet for Patients:  EntrepreneurPulse.com.au  Fact Sheet for Healthcare Providers:  IncredibleEmployment.be  This test is no t yet approved or cleared by the [place] FDA and  has been authorized for detection and/or diagnosis of SARS-CoV-2 by FDA under an Emergency Use Authorization (EUA). This EUA will remain  in effect (meaning this test can be used) for the duration of the COVID-19 declaration under Section 564(b)(1) of the Act, 21 U.S.C.section 360bbb-3(b)(1), unless the authorization is terminated  or revoked sooner.        Influenza A by PCR NEGATIVE NEGATIVE   Influenza B by PCR NEGATIVE NEGATIVE    Comment: (NOTE) The Xpert Xpress SARS-CoV-2/FLU/RSV plus assay is intended as an aid in the diagnosis of influenza from Nasopharyngeal swab specimens and should not be used as a sole basis for treatment. Nasal washings and aspirates are unacceptable for Xpert Xpress SARS-CoV-2/FLU/RSV testing.  Fact Sheet for Patients: EntrepreneurPulse.com.au  Fact Sheet for Healthcare Providers: IncredibleEmployment.be  This test is not yet approved or cleared by the [place] FDA and has been authorized for detection and/or diagnosis of SARS-CoV-2 by FDA under an Emergency Use Authorization (EUA). This EUA will remain in effect (meaning this test can be used) for the duration of the COVID-19 declaration under Section 564(b)(1) of the Act, 21 U.S.C. section 360bbb-3(b)(1), unless the authorization is terminated or revoked.  Performed at [facility], [place] [address], [place], [place] [address]   Lactic acid, plasma     Status: None   Collection Time: 11/26/20  5:08 PM  Result Value Ref Range   Lactic Acid, Venous 1.5 0.5 - 1.9 mmol/L    Comment: Performed at [facility], [place] [address] [place], [place] [address]  APTT     Status: Abnormal   Collection Time: 11/26/20  6:00 PM  Result Value Ref Range   aPTT 42 (H) 24 - 36 seconds    Comment:        IF BASELINE aPTT IS ELEVATED, SUGGEST PATIENT RISK ASSESSMENT BE USED TO DETERMINE APPROPRIATE ANTICOAGULANT THERAPY. Performed at [facility], [place] [address], [place], [place] [address]   Heparin level (unfractionated)     Status: Abnormal   Collection Time: 11/26/20  6:00 PM  Result Value Ref Range   Heparin Unfractionated >1.10 (H) 0.30 - 0.70 IU/mL    Comment: (NOTE) The clinical reportable range upper limit is being lowered to >1.10 to align with the  FDA approved guidance for the current laboratory assay.  If heparin results are below expected values, and patient dosage has  been confirmed, suggest follow up testing of antithrombin III levels. Performed at [facility], [place] [address], [place], [place] [address]   Protime-INR     Status: Abnormal   Collection Time: 11/27/20  4:18 AM  Result Value Ref Range   Prothrombin Time 16.0 (H) 11.4 - 15.2 seconds   INR 1.3 (H) 0.8 - 1.2    Comment: (NOTE) INR goal varies based on device and disease states. Performed at [facility], [place] [address] [place], [place] [address]   Cortisol-am, blood     Status: Abnormal   Collection Time: 11/27/20  4:18 AM  Result Value Ref Range   Cortisol - AM 28.6 (H) 6.7 - 22.6 ug/dL    Comment: Performed at [place] [address], [place], [place] [address]  Procalcitonin     Status: None   Collection Time: 11/27/20  4:18 AM  Result Value Ref Range   Procalcitonin 1.89 ng/mL    Comment:        Interpretation: PCT > 0.5 ng/mL and <= 2 ng/mL: Systemic infection (sepsis) is possible, but other conditions are known to elevate PCT as well. (NOTE)       Sepsis PCT Algorithm           Lower Respiratory Tract                                      Infection PCT Algorithm    ----------------------------     ----------------------------         PCT < 0.25 ng/mL                PCT < 0.10 ng/mL          Strongly encourage             Strongly discourage   discontinuation of antibiotics    initiation of antibiotics    ----------------------------     -----------------------------       PCT 0.25 - 0.50 ng/mL            PCT 0.10 - 0.25 ng/mL               OR       >80% decrease in PCT            Discourage initiation of                                            antibiotics      Encourage discontinuation           of antibiotics    ----------------------------     -----------------------------         PCT  >= 0.50 ng/mL              PCT 0.26 - 0.50 ng/mL                AND       <80% decrease in PCT             Encourage initiation of  antibiotics       Encourage continuation           of antibiotics    ----------------------------     -----------------------------        PCT >= 0.50 ng/mL                  PCT > 0.50 ng/mL               AND         increase in PCT                  Strongly encourage                                      initiation of antibiotics    Strongly encourage escalation           of antibiotics                                     -----------------------------                                           PCT <= 0.25 ng/mL                                                 OR                                        > 80% decrease in PCT                                      Discontinue / Do not initiate                                             antibiotics  Performed at [place] [address], [place], [place] [address]   Comprehensive metabolic panel     Status: Abnormal   Collection Time: 11/27/20  4:18 AM  Result Value Ref Range   Sodium 135 135 - 145 mmol/L   Potassium 4.6 3.5 - 5.1 mmol/L   Chloride 98 98 - 111 mmol/L   CO2 29 22 - 32 mmol/L   Glucose, Bld 94 70 - 99 mg/dL    Comment: Glucose reference range applies only to samples taken after fasting for at least 8 hours.   BUN 32 (H) 8 - 23 mg/dL   Creatinine, Ser 1.54 (H) 0.44 - 1.00 mg/dL   Calcium 8.9 8.9 - 10.3 mg/dL   Total Protein 6.9 6.5 - 8.1 g/dL   Albumin 3.1 (L) 3.5 - 5.0 g/dL   AST 24 15 - 41 U/L   ALT 12 0 - 44 U/L   Alkaline Phosphatase 67 38 - 126 U/L   Total Bilirubin  1.2 0.3 - 1.2 mg/dL   GFR, Estimated 30 (L) >60 mL/min    Comment: (NOTE) Calculated using the CKD-EPI Creatinine Equation (2021)    Anion gap 8 5 - 15    Comment: Performed at [facility], [place] [address] [place], [place] [address]   APTT     Status: Abnormal   Collection Time: 11/27/20  4:18 AM  Result Value Ref Range   aPTT 82 (H) 24 - 36 seconds    Comment:        IF BASELINE aPTT IS ELEVATED, SUGGEST PATIENT RISK ASSESSMENT BE USED TO DETERMINE APPROPRIATE ANTICOAGULANT THERAPY. Performed at [facility], [place] [address] [place], [place] [address]   Heparin level (unfractionated)     Status: Abnormal   Collection Time: 11/27/20  4:18 AM  Result Value Ref Range   Heparin Unfractionated >1.10 (H) 0.30 - 0.70 IU/mL    Comment: (NOTE) The clinical reportable range upper limit is being lowered to >1.10 to align with the FDA approved guidance for the current laboratory assay.  If heparin results are below expected values, and patient dosage has  been confirmed, suggest follow up testing of antithrombin III levels. Performed at [facility], [place] [address] [place], [place] [address]   CBC     Status: Abnormal   Collection Time: 11/27/20  4:18 AM  Result Value Ref Range   WBC 13.6 (H) 4.0 - 10.5 K/uL   RBC 4.58 3.87 - 5.11 MIL/uL   Hemoglobin 14.7 12.0 - 15.0 g/dL   HCT 44.9 36.0 - 46.0 %   MCV 98.0 80.0 - 100.0 fL   MCH 32.1 26.0 - 34.0 pg   MCHC 32.7 30.0 - 36.0 g/dL   RDW 13.3 11.5 - 15.5 %   Platelets 178 150 - 400 K/uL   nRBC 0.0 0.0 - 0.2 %    Comment: Performed at [facility], [place] [address], [place], [place] [address]    Imaging / Studies: US RENAL  Result Date: 11/26/2020 CLINICAL DATA:  Acute kidney injury. EXAM: RENAL / URINARY TRACT ULTRASOUND COMPLETE COMPARISON:  CT a abdomen 11/26/2020 FINDINGS: Markedly limited evaluation. Right Kidney: Renal measurements: 8.7 x 4.6 x 4.3 cm = volume: 90 mL. Echogenicity within normal limits. No mass or hydronephrosis visualized. Left Kidney: Renal measurements: 8.5 x 4.3 x 4.5 cm = volume: 85 mL. Echogenicity within normal limits. There is a 2.6 x 2.5 x 2.3 cm hypoechoic lesion with no associated  mural nodularity, septation, vascularity no solid mass. No hydronephrosis visualized. Urinary bladder: Appears normal for degree of bladder distention. Other: None. IMPRESSION: Markedly limited evaluation with grossly unremarkable kidneys. Electronically Signed   By: Iven Finn M.D.   On: 11/26/2020 18:12   MR 3D Recon At Scanner  Result Date: 11/27/2020 CLINICAL DATA:  Cholecystitis. Question choledochocolonic fistula on CT scan. EXAM: MRI ABDOMEN WITHOUT AND WITH CONTRAST (INCLUDING MRCP) TECHNIQUE: Multiplanar multisequence MR imaging of the abdomen was performed both before and after the administration of intravenous contrast. Heavily T2-weighted images of the biliary and pancreatic ducts were obtained, and three-dimensional MRCP images were rendered by post processing. CONTRAST:  55m GADAVIST GADOBUTROL 1 MMOL/ML IV SOLN COMPARISON:  CT 11/26/2020 and 05/26/2020. FINDINGS: Despite efforts by the technologist and patient, mild motion artifact is present on today's exam and could not be eliminated. This reduces exam sensitivity and specificity. Lower chest: Trace right pleural effusion. The visualized lower chest otherwise appears unremarkable. Hepatobiliary: Transient  hypervascularity inferiorly in the left hepatic lobe again noted. No suspicious focal hepatic lesions are identified. There is mild extrahepatic biliary dilatation with the common hepatic duct measuring 10 mm in diameter. No evidence of choledocholithiasis. In correlation with previous CTs, there is gas within the gallbladder lumen which demonstrates diffuse wall thickening. There is no definite gas within the remainder of the biliary system. 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 Pancreas: Unremarkable. No pancreatic ductal dilatation or surrounding inflammatory changes. Spleen: Normal in size without focal abnormality. Adrenals/Urinary Tract: Both adrenal glands appear  normal. Stable cyst in the upper pole of the left kidney. No evidence of enhancing renal mass or hydronephrosis. Stomach/Bowel: There is a periampullary duodenal diverticulum. 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 Vascular/Lymphatic: There are no enlarged abdominal lymph nodes. Aortic and branch vessel atherosclerosis with proximal SMA stenosis, better seen on recent CTA. Other: No evidence of abdominal wall hernia or ascites. Musculoskeletal: No acute or significant osseous findings. Multilevel spondylosis. IMPRESSION: 1. 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 As seen on CT, there is diffuse gallbladder wall thickening which could reflect acalculous cholecystitis. 2. Mild extrahepatic biliary dilatation without evidence of choledocholithiasis. 3. 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 Aortic Atherosclerosis (ICD10-I70.0). Electronically Signed   By: Richardean Sale M.D.   On: 11/27/2020 08:05   MR ABDOMEN MRCP W WO CONTAST  Result Date: 11/27/2020 CLINICAL DATA:  Cholecystitis. Question choledochocolonic fistula on CT scan. EXAM: MRI ABDOMEN WITHOUT AND WITH CONTRAST (INCLUDING MRCP) TECHNIQUE: Multiplanar multisequence MR imaging of the abdomen was performed both before and after the administration of intravenous contrast. Heavily T2-weighted images of the biliary and pancreatic ducts were obtained, and three-dimensional MRCP images were rendered by post processing. CONTRAST:  63m GADAVIST GADOBUTROL 1 MMOL/ML IV SOLN COMPARISON:  CT 11/26/2020 and 05/26/2020. FINDINGS: Despite efforts by the technologist and patient, mild motion artifact is present on today's exam and could not be eliminated. This reduces exam sensitivity and specificity. Lower chest: Trace right pleural effusion. The visualized lower chest otherwise appears unremarkable. Hepatobiliary: Transient  hypervascularity inferiorly in the left hepatic lobe again noted. No suspicious focal hepatic lesions are identified. There is mild extrahepatic biliary dilatation with the common hepatic duct measuring 10 mm in diameter. No evidence of choledocholithiasis. In correlation with previous CTs, there is gas within the gallbladder lumen which demonstrates diffuse wall thickening. There is no definite gas within the remainder of the biliary system. 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 Pancreas: Unremarkable. No pancreatic ductal dilatation or surrounding inflammatory changes. Spleen: Normal in size without focal abnormality. Adrenals/Urinary Tract: Both adrenal glands appear normal. Stable cyst in the upper pole of the left kidney. No evidence of enhancing renal mass or hydronephrosis. Stomach/Bowel: There is a periampullary duodenal diverticulum. 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 Vascular/Lymphatic: There are no enlarged abdominal lymph nodes. Aortic and branch vessel atherosclerosis with proximal SMA stenosis, better seen on recent CTA. Other: No evidence of abdominal wall hernia or ascites. Musculoskeletal: No acute or significant osseous findings. Multilevel spondylosis. IMPRESSION: 1. 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 As seen on CT,  there is diffuse gallbladder wall thickening which could reflect acalculous cholecystitis. 2. Mild extrahepatic biliary dilatation without evidence of choledocholithiasis. 3. 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 Aortic Atherosclerosis (ICD10-I70.0). Electronically Signed   By: Richardean Sale M.D.   On: 11/27/2020 08:05   CT Angio Abd/Pel W and/or Wo Contrast  Addendum Date: 11/26/2020   ADDENDUM REPORT: 11/26/2020 17:27 ADDENDUM: There is a typographical error  in the IMPRESSION, #3 should read as follows: 3. 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 Electronically Signed   By: Lucrezia Europe M.D.   On: 11/26/2020 17:27   Result Date: 11/26/2020 CLINICAL DATA:  Near syncope, acute mesenteric ischemia EXAM: CTA ABDOMEN AND PELVIS WITH CONTRAST TECHNIQUE: Multidetector CT imaging of the abdomen and pelvis was performed using the standard protocol during bolus administration of intravenous contrast. Multiplanar reconstructed images and MIPs were obtained and reviewed to evaluate the vascular anatomy. CONTRAST:  5m OMNIPAQUE IOHEXOL 350 MG/ML SOLN COMPARISON:  05/26/2020 FINDINGS: VASCULAR Coronary calcifications. At least mild cardiomegaly with right atrial enlargement. Aorta: Extensive calcified atheromatous plaque. No aneurysm, dissection, or stenosis. Celiac: Calcified ostial plaque resulting in short segment mild stenosis, patent distally. SMA: Partially calcified plaque extending from the origin approximately 1.7 cm resulting in segmental stenosis of at least moderate severity, the distal vessel mildly atheromatous but patent with classic branch anatomy. Renals: Single left, with calcified ostial plaque over length of approximately 1 cm resulting in at least mild stenosis, patent distally. Single right, with calcified ostial plaque resulting in mild short-segment stenosis, patent distally. IMA: Patent, with short-segment origin stenosis related to aortic wall plaque, and atheromatous irregularity of indeterminate hemodynamic significance approximately 2 cm distally, with patent distal branch anatomy. Inflow: Moderate calcified plaque without high-grade stenosis. Mild tortuosity. Proximal Outflow: Calcified plaque at the left SFA origin resulting in stenosis of at least mild severity, atheromatous but patent in the visualized distal portion. Veins: Patent hepatic veins,  portal vein, SVC, splenic vein, bilateral renal veins, iliac confluence and IVC. Review of the MIP images confirms the above findings. NON-VASCULAR Lower chest: No pleural or pericardial effusion. Hepatobiliary: Transient hepatic attenuation difference in arterial phase scanning, with no discrete lesion evident on venous phase. Calcified subcapsular lesion in segment 7 stable since 02/14/2012. There is a tubular subhepatic process which appears confluent with the hepatic flexure of the colon and has been consistently present on prior studies, containing some gas and fluid , suggestive of gallbladder with cholecystocolonic fistula. There is new marked thickening of the wall of the presumed gallbladder, and mucosal enhancement. No calcified gallstones. Pancreas: Unremarkable. No pancreatic ductal dilatation or surrounding inflammatory changes. Spleen: Normal in size without focal abnormality. Adrenals/Urinary Tract: Adrenal glands unremarkable. 2.2 cm probable cyst, upper pole left kidney, stable since 02/10/2020. No hydronephrosis. Urinary bladder is physiologically distended. Stomach/Bowel: Stomach is incompletely distended. Duodenal diverticulum. The small bowel is decompressed. Appendix not identified. 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 Apparent patulous segment of colon anteriorly at the level of the aortic bifurcation which is stable since 05/26/2020. Innumerable distal descending and sigmoid diverticula without regional inflammatory change. Lymphatic: No abdominal or pelvic adenopathy. Reproductive: Status post hysterectomy. No adnexal masses. Other: Bilateral pelvic phleboliths.  No ascites.  No   free air. Musculoskeletal: Multilevel spondylitic changes in the lumbar spine. Osteitis pubis. No acute fracture or worrisome bone lesion. IMPRESSION: 1. 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 Patent celiac axis and inferior mesenteric artery may provide adequate  collateral circulation to the mesentery. 2. Left SFA origin stenosis of possible hemodynamic significance. Correlate with clinical symptomatology and ABIs. 3. 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 4. Descending and sigmoid diverticulosis. Electronically Signed: By: Lucrezia Europe M.D. On: 11/26/2020 15:13    Medications / Allergies: per chart  Antibiotics: Anti-infectives (From admission, onward)    Start     Dose/Rate Route Frequency Ordered Stop   11/26/20 2200  piperacillin-tazobactam (ZOSYN) IVPB 2.25 g        2.25 g 100 mL/hr over 30 Minutes Intravenous Every 8 hours 11/26/20 1837     11/26/20 1530  piperacillin-tazobactam (ZOSYN) IVPB 3.375 g        3.375 g 100 mL/hr over 30 Minutes Intravenous  Once 11/26/20 1529 11/26/20 1636         Note: Portions of this report may have been transcribed using voice recognition software. Every effort was made to ensure accuracy; however, inadvertent computerized transcription errors may be present.   Any transcriptional errors that result from this process are unintentional.    Adin Hector, MD, FACS, MASCRS Esophageal, Gastrointestinal & Colorectal Surgery Robotic and Minimally Invasive Surgery  [facility], [place]  [place], [place], [place] [address] [phone] Fax [phone] Main  CONTACT INFORMATION:  Weekday (9AM-5PM): Call [facility] main office at [phone]  Weeknight (5PM-9AM) or Weekend/Holiday: Check [email or web address] (password " [email or web address]") for General Surgery [facility] coverage  (Please, do not use SecureChat as it is not reliable communication to operating surgeons for immediate patient care)      11/27/2020  8:23 AM

## 2020-11-27 NOTE — Progress Notes (Addendum)
PROGRESS NOTE    NOON SIPP  [id number] DOB: 1921-02-23 DOA: 11/26/2020 PCP: Holland Commons, FNP    Brief Narrative:  [name] is a 87 year old female with past medical history significant for paroxysmal atrial fibrillation on anticoagulation with Eliquis, CKD stage IIIa, chronic diastolic congestive heart failure, CAD, who presented from [facility] assisted [facility] facility via EMS for near syncopal episode.  Patient does not recall event and staff reported patient did not fall and no loss of consciousness.  Patient has been reporting pain to the abdomen without nausea/vomiting/diarrhea.  Denies chest pain.  In the ED, her 91.1 F, HR 89, RR 16, BP 112/68, SPO2 97% on room air.  Sodium 133, potassium 4.2, chloride 96, CO2 29, BUN 33, creatinine 1.47, glucose 128.  AST 26, ALT 16, total bilirubin 0.9.  WBC 16.9, hemoglobin 14.7, platelets 165.  High sensitive troponin 9> 8.  Cova-19 PCR negative.  Influenza A/B PCR negative.  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  General surgery was consulted.  Patient was started on IV antibiotics.  TRH consulted for further evaluation and management.   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  Patient notable with elevated WBC count of 16.9 and procalcitonin 1.89.  LFTs within normal limits. 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  No blood cultures performed in the ED or on admission. --General surgery following, appreciate assistance; not optimal surgical candidate given her advanced age and comorbidities --WBC 16.9>13.6 --Zosyn 2.25 g IV every 8 hours --Clear liquid diet --NS at 100 mL/h --Follow WBC/procalcitonin daily --Supportive care  Near syncopal episode Etiology likely secondary to abdominal infection as above with likely acalculous cholecystitis.  No fall or loss of consciousness.  TTE 02/11/2020 with LVEF 60 to 65%, no LV R WMA, mild LVH, no aortic valve stenosis.  Carotid duplex ultrasound 08/06/2020 with no significant flow restrictions bilateral carotid arteries. --Continue treatment as above --Continue to monitor on telemetry  AKI on CKD stage IIIa Baseline creatinine 1.05.  Creatinine on admission 1.47.  Suspect prerenal azotemia from abdominal infection as above.  Renal ultrasound, limited exam but grossly unremarkable kidneys. --Cr 1.47>1.54 --Avoid nephrotoxins, renally dose all medications --Repeat BMP in a.m.  Proximal SMA stenosis CT angiogram abdomen/pelvis notable for proximal SMA stenosis with patent celiac/IMA likely providing collateral circulation to the mesentery.  Likely chronic. --Continue atorvastatin 10 mg p.o. daily  Left SFA origin stenosis CT angiogram abdomen/pelvis notable for left SFA stenosis.  Patient not complaining of claudication or leg pain.  Hold on obtaining ABIs for now given not optimal surgical candidate.  Paroxysmal atrial fibrillation EKG on admission with atrial fibrillation, rate 61; no concerning ischemic findings.  High sensitive troponin within normal limits x2.  Chest x-ray with  no acute cardiopulmonary disease process. --Holding Eliquis, on heparin drip --Decrease diltiazem to  30 mg p.o. 3 times daily given borderline low heart rate (hold for SBP <100 or HR<60)  HLD: Atorvastatin 10 mg p.o. daily  Hypothyroidism: Levothyroxine 100 mcg p.o. daily   DVT prophylaxis: Heparin drip   Code Status: DNR Family Communication: No family present at bedside this morning, attempted to update patient's son, but via telephone unsuccessful.  Voicemail left.  Disposition Plan:  Level of care: Telemetry Status is: Inpatient  Remains inpatient appropriate because:Ongoing active pain requiring inpatient pain management, Ongoing diagnostic testing needed not appropriate for outpatient work up, Unsafe d/c plan, IV treatments appropriate due to intensity of illness or inability to take PO, and Inpatient level of care appropriate due to severity of illness  Dispo: The patient is from: ALF              Anticipated d/c is to: ALF              Patient currently is not medically stable to d/c.   Difficult to place patient No  Consultants:  General surgery  Procedures:  None  Antimicrobials:  Zosyn 8/19   Subjective: Patient seen examined bedside, resting comfortably.  Continues in ED holding area.  Continues with mild bilateral lower quadrant pain and right upper quadrant tenderness.  No family present at bedside.  No other specific complaints or concerns at this time.  Denies headache, no visual changes, no chest pain, no palpitations, no shortness of breath, no cough/congestion, no fever/chills/night sweats, no nausea/vomiting/diarrhea.  No acute events overnight per nursing staff.  Objective: Vitals:   11/27/20 0045 11/27/20 0345 11/27/20 0400 11/27/20 0700  BP: (!) 90/56 103/64 103/72 108/82  Pulse: 70 (!) 129 61 (!) 52  Resp: 14 (!) '31 17 19  '$ Temp:      TempSrc:      SpO2: 91% 94% 93% 93%  Weight:      Height:        Intake/Output Summary (Last 24 hours) at  11/27/2020 1057 Last data filed at 11/27/2020 0558 Gross per 24 hour  Intake 89.11 ml  Output --  Net 89.11 ml   Filed Weights   11/26/20 1158  Weight: 49.9 kg    Examination:  General exam: Appears calm and comfortable, elderly and chronically ill in appearance Respiratory system: Clear to auscultation. Respiratory effort normal.  On room air Cardiovascular system: S1 & S2 heard, RRR. No JVD, murmurs, rubs, gallops or clicks. No pedal edema. Gastrointestinal system: Abdomen is nondistended, soft, mild tenderness to bilateral lower quadrants and right upper quadrant. No organomegaly or masses felt. Normal bowel sounds heard. Central nervous system: Alert and oriented. No focal neurological deficits. Extremities: Symmetric 5 x 5 power. Skin: Multiple areas of ecchymosis in various stages of healing to extremities Psychiatry: Judgement and insight appear normal. Mood & affect appropriate.     Data Reviewed: I have personally reviewed following labs and imaging studies  CBC: Recent Labs  Lab 11/26/20 1223 11/27/20 0418  WBC 16.9* 13.6*  NEUTROABS 14.0*  --   HGB 14.7 14.7  HCT 44.8 44.9  MCV 98.2 98.0  PLT 165 0000000   Basic Metabolic Panel: Recent Labs  Lab 11/26/20 1223 11/27/20 0418  NA 133* 135  K 4.2 4.6  CL 96* 98  CO2 29 29  GLUCOSE 128* 94  BUN 33* 32*  CREATININE 1.47* 1.54*  CALCIUM 9.0 8.9   GFR: Estimated Creatinine Clearance: 16.1 mL/min (A) (by C-G formula based on SCr of 1.54 mg/dL (H)). Liver Function Tests:  Recent Labs  Lab 11/26/20 1223 11/27/20 0418  AST 26 24  ALT 16 12  ALKPHOS 71 67  BILITOT 0.9 1.2  PROT 7.3 6.9  ALBUMIN 3.4* 3.1*   No results for input(s): LIPASE, AMYLASE in the last 168 hours. No results for input(s): AMMONIA in the last 168 hours. Coagulation Profile: Recent Labs  Lab 11/27/20 0418  INR 1.3*   Cardiac Enzymes: No results for input(s): CKTOTAL, CKMB, CKMBINDEX, TROPONINI in the last 168 hours. BNP (last 3  results) No results for input(s): PROBNP in the last 8760 hours. HbA1C: No results for input(s): HGBA1C in the last 72 hours. CBG: Recent Labs  Lab 11/26/20 1159  GLUCAP 139*   Lipid Profile: No results for input(s): CHOL, HDL, LDLCALC, TRIG, CHOLHDL, LDLDIRECT in the last 72 hours. Thyroid Function Tests: No results for input(s): TSH, T4TOTAL, FREET4, T3FREE, THYROIDAB in the last 72 hours. Anemia Panel: No results for input(s): VITAMINB12, FOLATE, FERRITIN, TIBC, IRON, RETICCTPCT in the last 72 hours. Sepsis Labs: Recent Labs  Lab 11/26/20 1708 11/27/20 0418  PROCALCITON  --  1.89  LATICACIDVEN 1.5  --     Recent Results (from the past 240 hour(s))  Resp Panel by RT-PCR (Flu A&B, Covid) Nasopharyngeal Swab     Status: None   Collection Time: 11/26/20  4:40 PM   Specimen: Nasopharyngeal Swab; Nasopharyngeal(NP) swabs in vial transport medium  Result Value Ref Range Status   SARS Coronavirus 2 by RT PCR NEGATIVE NEGATIVE Final    Comment: (NOTE) SARS-CoV-2 target nucleic acids are NOT DETECTED.  The SARS-CoV-2 RNA is generally detectable in upper respiratory specimens during the acute phase of infection. The lowest concentration of SARS-CoV-2 viral copies this assay can detect is 138 copies/mL. A negative result does not preclude SARS-Cov-2 infection and should not be used as the sole basis for treatment or other patient management decisions. A negative result may occur with  improper specimen collection/handling, submission of specimen other than nasopharyngeal swab, presence of viral mutation(s) within the areas targeted by this assay, and inadequate number of viral copies(<138 copies/mL). A negative result must be combined with clinical observations, patient history, and epidemiological information. The expected result is Negative.  Fact Sheet for Patients:  EntrepreneurPulse.com.au  Fact Sheet for Healthcare Providers:   IncredibleEmployment.be  This test is no t yet approved or cleared by the [place] FDA and  has been authorized for detection and/or diagnosis of SARS-CoV-2 by FDA under an Emergency Use Authorization (EUA). This EUA will remain  in effect (meaning this test can be used) for the duration of the COVID-19 declaration under Section 564(b)(1) of the Act, 21 U.S.C.section 360bbb-3(b)(1), unless the authorization is terminated  or revoked sooner.       Influenza A by PCR NEGATIVE NEGATIVE Final   Influenza B by PCR NEGATIVE NEGATIVE Final    Comment: (NOTE) The Xpert Xpress SARS-CoV-2/FLU/RSV plus assay is intended as an aid in the diagnosis of influenza from Nasopharyngeal swab specimens and should not be used as a sole basis for treatment. Nasal washings and aspirates are unacceptable for Xpert Xpress SARS-CoV-2/FLU/RSV testing.  Fact Sheet for Patients: EntrepreneurPulse.com.au  Fact Sheet for Healthcare Providers: IncredibleEmployment.be  This test is not yet approved or cleared by the [place] FDA and has been authorized for detection and/or diagnosis of SARS-CoV-2 by FDA under an Emergency Use Authorization (EUA). This EUA will remain in effect (meaning this test can be used) for the duration of the COVID-19 declaration under Section  564(b)(1) of the Act, 21 U.S.C. section 360bbb-3(b)(1), unless the authorization is terminated or revoked.  Performed at [facility], [place] [address] [place], [place] [address]          Radiology Studies: US RENAL  Result Date: 11/26/2020 CLINICAL DATA:  Acute kidney injury. EXAM: RENAL / URINARY TRACT ULTRASOUND COMPLETE COMPARISON:  CT a abdomen 11/26/2020 FINDINGS: Markedly limited evaluation. Right Kidney: Renal measurements: 8.7 x 4.6 x 4.3 cm = volume: 90 mL. Echogenicity within normal limits. No mass or hydronephrosis visualized. Left Kidney: Renal  measurements: 8.5 x 4.3 x 4.5 cm = volume: 85 mL. Echogenicity within normal limits. There is a 2.6 x 2.5 x 2.3 cm hypoechoic lesion with no associated mural nodularity, septation, vascularity no solid mass. No hydronephrosis visualized. Urinary bladder: Appears normal for degree of bladder distention. Other: None. IMPRESSION: Markedly limited evaluation with grossly unremarkable kidneys. Electronically Signed   By: Iven Finn M.D.   On: 11/26/2020 18:12   MR 3D Recon At Scanner  Result Date: 11/27/2020 CLINICAL DATA:  Cholecystitis. Question choledochocolonic fistula on CT scan. EXAM: MRI ABDOMEN WITHOUT AND WITH CONTRAST (INCLUDING MRCP) TECHNIQUE: Multiplanar multisequence MR imaging of the abdomen was performed both before and after the administration of intravenous contrast. Heavily T2-weighted images of the biliary and pancreatic ducts were obtained, and three-dimensional MRCP images were rendered by post processing. CONTRAST:  57m GADAVIST GADOBUTROL 1 MMOL/ML IV SOLN COMPARISON:  CT 11/26/2020 and 05/26/2020. FINDINGS: Despite efforts by the technologist and patient, mild motion artifact is present on today's exam and could not be eliminated. This reduces exam sensitivity and specificity. Lower chest: Trace right pleural effusion. The visualized lower chest otherwise appears unremarkable. Hepatobiliary: Transient hypervascularity inferiorly in the left hepatic lobe again noted. No suspicious focal hepatic lesions are identified. There is mild extrahepatic biliary dilatation with the common hepatic duct measuring 10 mm in diameter. No evidence of choledocholithiasis. In correlation with previous CTs, there is gas within the gallbladder lumen which demonstrates diffuse wall thickening. There is no definite gas within the remainder of the biliary system. 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 Pancreas: Unremarkable. No pancreatic  ductal dilatation or surrounding inflammatory changes. Spleen: Normal in size without focal abnormality. Adrenals/Urinary Tract: Both adrenal glands appear normal. Stable cyst in the upper pole of the left kidney. No evidence of enhancing renal mass or hydronephrosis. Stomach/Bowel: There is a periampullary duodenal diverticulum. 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 Vascular/Lymphatic: There are no enlarged abdominal lymph nodes. Aortic and branch vessel atherosclerosis with proximal SMA stenosis, better seen on recent CTA. Other: No evidence of abdominal wall hernia or ascites. Musculoskeletal: No acute or significant osseous findings. Multilevel spondylosis. IMPRESSION: 1. 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 As seen on CT, there is diffuse gallbladder wall thickening which could reflect acalculous cholecystitis. 2. Mild extrahepatic biliary dilatation without evidence of choledocholithiasis. 3. 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 Aortic Atherosclerosis (ICD10-I70.0). Electronically Signed   By: WRichardean SaleM.D.   On: 11/27/2020 08:05   MR ABDOMEN MRCP W WO CONTAST  Result Date: 11/27/2020 CLINICAL DATA:  Cholecystitis. Question choledochocolonic fistula on CT scan. EXAM: MRI ABDOMEN WITHOUT AND WITH CONTRAST (INCLUDING MRCP) TECHNIQUE: Multiplanar multisequence MR imaging of the abdomen was performed both before and after the administration  of intravenous contrast. Heavily T2-weighted images of the biliary and pancreatic ducts were obtained, and three-dimensional MRCP images were rendered by post processing. CONTRAST:  62m GADAVIST GADOBUTROL 1 MMOL/ML IV SOLN COMPARISON:  CT 11/26/2020 and 05/26/2020. FINDINGS: Despite efforts by the technologist and patient, mild motion artifact is present on today's exam and could not be eliminated. This reduces exam  sensitivity and specificity. Lower chest: Trace right pleural effusion. The visualized lower chest otherwise appears unremarkable. Hepatobiliary: Transient hypervascularity inferiorly in the left hepatic lobe again noted. No suspicious focal hepatic lesions are identified. There is mild extrahepatic biliary dilatation with the common hepatic duct measuring 10 mm in diameter. No evidence of choledocholithiasis. In correlation with previous CTs, there is gas within the gallbladder lumen which demonstrates diffuse wall thickening. There is no definite gas within the remainder of the biliary system. 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 Pancreas: Unremarkable. No pancreatic ductal dilatation or surrounding inflammatory changes. Spleen: Normal in size without focal abnormality. Adrenals/Urinary Tract: Both adrenal glands appear normal. Stable cyst in the upper pole of the left kidney. No evidence of enhancing renal mass or hydronephrosis. Stomach/Bowel: There is a periampullary duodenal diverticulum. 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 Vascular/Lymphatic: There are no enlarged abdominal lymph nodes. Aortic and branch vessel atherosclerosis with proximal SMA stenosis, better seen on recent CTA. Other: No evidence of abdominal wall hernia or ascites. Musculoskeletal: No acute or significant osseous findings. Multilevel spondylosis. IMPRESSION: 1. 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 As seen on CT, there is diffuse gallbladder wall thickening which could reflect acalculous cholecystitis. 2. Mild extrahepatic biliary dilatation without evidence of choledocholithiasis. 3. 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 Aortic Atherosclerosis (ICD10-I70.0). Electronically Signed   By: WRichardean SaleM.D.   On:  11/27/2020 08:05   CT Angio Abd/Pel W and/or Wo Contrast  Addendum Date: 11/26/2020   ADDENDUM REPORT: 11/26/2020 17:27 ADDENDUM: There is a typographical error in the IMPRESSION, #3 should read as follows: 3. 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 Electronically Signed   By: DLucrezia EuropeM.D.   On: 11/26/2020 17:27   Result Date: 11/26/2020 CLINICAL DATA:  Near syncope, acute mesenteric ischemia EXAM: CTA ABDOMEN AND PELVIS WITH CONTRAST TECHNIQUE: Multidetector CT imaging of the abdomen and pelvis was performed using the standard protocol during bolus administration of intravenous contrast. Multiplanar reconstructed images and MIPs were obtained and reviewed to evaluate the vascular anatomy. CONTRAST:  627mOMNIPAQUE IOHEXOL 350 MG/ML SOLN COMPARISON:  05/26/2020 FINDINGS: VASCULAR Coronary calcifications. At least mild cardiomegaly with right atrial enlargement. Aorta: Extensive calcified atheromatous plaque. No aneurysm, dissection, or stenosis. Celiac: Calcified ostial plaque resulting in short segment mild stenosis, patent distally. SMA: Partially calcified plaque extending from the origin approximately 1.7 cm resulting in segmental stenosis of at least moderate severity, the distal vessel mildly atheromatous but patent with classic branch anatomy. Renals: Single left, with calcified ostial plaque over length of approximately 1 cm resulting in at least mild stenosis, patent distally. Single right, with calcified ostial plaque resulting in mild short-segment stenosis, patent distally. IMA: Patent, with short-segment origin stenosis related to aortic wall plaque, and atheromatous irregularity of indeterminate hemodynamic significance approximately 2 cm distally, with patent distal branch anatomy. Inflow: Moderate calcified plaque without  high-grade stenosis. Mild tortuosity. Proximal Outflow: Calcified  plaque at the left SFA origin resulting in stenosis of at least mild severity, atheromatous but patent in the visualized distal portion. Veins: Patent hepatic veins, portal vein, SVC, splenic vein, bilateral renal veins, iliac confluence and IVC. Review of the MIP images confirms the above findings. NON-VASCULAR Lower chest: No pleural or pericardial effusion. Hepatobiliary: Transient hepatic attenuation difference in arterial phase scanning, with no discrete lesion evident on venous phase. Calcified subcapsular lesion in segment 7 stable since 02/14/2012. There is a tubular subhepatic process which appears confluent with the hepatic flexure of the colon and has been consistently present on prior studies, containing some gas and fluid , suggestive of gallbladder with cholecystocolonic fistula. There is new marked thickening of the wall of the presumed gallbladder, and mucosal enhancement. No calcified gallstones. Pancreas: Unremarkable. No pancreatic ductal dilatation or surrounding inflammatory changes. Spleen: Normal in size without focal abnormality. Adrenals/Urinary Tract: Adrenal glands unremarkable. 2.2 cm probable cyst, upper pole left kidney, stable since 02/10/2020. No hydronephrosis. Urinary bladder is physiologically distended. Stomach/Bowel: Stomach is incompletely distended. Duodenal diverticulum. The small bowel is decompressed. Appendix not identified. 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 Apparent patulous segment of colon anteriorly at the level of the aortic bifurcation which is stable since 05/26/2020. Innumerable distal descending and sigmoid diverticula without regional inflammatory change. Lymphatic: No abdominal or pelvic adenopathy. Reproductive: Status post hysterectomy. No adnexal masses. Other: Bilateral pelvic phleboliths.  No ascites.  No   free air. Musculoskeletal: Multilevel spondylitic changes in the lumbar spine. Osteitis pubis. No acute fracture or worrisome  bone lesion. IMPRESSION: 1. 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 Patent celiac axis and inferior mesenteric artery may provide adequate collateral circulation to the mesentery. 2. Left SFA origin stenosis of possible hemodynamic significance. Correlate with clinical symptomatology and ABIs. 3. 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 4. Descending and sigmoid diverticulosis. Electronically Signed: By: Lucrezia Europe M.D. On: 11/26/2020 15:13        Scheduled Meds:  atorvastatin  10 mg Oral QHS   diltiazem  60 mg Oral TID   levothyroxine  100 mcg Oral Daily   metoprolol tartrate  100 mg Oral BID   mirabegron ER  50 mg Oral QHS   vitamin B-12  1,000 mcg Oral Daily   Continuous Infusions:  sodium chloride 100 mL/hr at 11/26/20 1759   heparin 700 Units/hr (11/26/20 2029)   piperacillin-tazobactam (ZOSYN)  IV Stopped (11/27/20 0558)     LOS: 1 day    Time spent: 39 minutes spent on chart review, discussion with nursing staff, consultants, updating family and interview/physical exam; more than 50% of that time was spent in counseling and/or coordination of care.    Navdeep Halt J [place] DO [facility] Available via Epic secure chat 7am-7pm After these hours, please refer to coverage provider listed on [email or web address] 11/27/2020, 10:57 AM

## 2020-11-28 LAB — BASIC METABOLIC PANEL
Anion gap: 11 (ref 5–15)
BUN: 28 mg/dL — ABNORMAL HIGH (ref 8–23)
CO2: 26 mmol/L (ref 22–32)
Calcium: 8.7 mg/dL — ABNORMAL LOW (ref 8.9–10.3)
Chloride: 97 mmol/L — ABNORMAL LOW (ref 98–111)
Creatinine, Ser: 1.41 mg/dL — ABNORMAL HIGH (ref 0.44–1.00)
GFR, Estimated: 34 mL/min — ABNORMAL LOW (ref >60)
Glucose, Bld: 92 mg/dL (ref 70–99)
Potassium: 4.1 mmol/L (ref 3.5–5.1)
Sodium: 134 mmol/L — ABNORMAL LOW (ref 135–145)

## 2020-11-28 LAB — CBC
HCT: 44.6 % (ref 36.0–46.0)
Hemoglobin: 14.4 g/dL (ref 12.0–15.0)
MCH: 31.6 pg (ref 26.0–34.0)
MCHC: 32.3 g/dL (ref 30.0–36.0)
MCV: 98 fL (ref 80.0–100.0)
Platelets: 182 10*3/uL (ref 150–400)
RBC: 4.55 MIL/uL (ref 3.87–5.11)
RDW: 13.2 % (ref 11.5–15.5)
WBC: 7.5 10*3/uL (ref 4.0–10.5)
nRBC: 0 % (ref 0.0–0.2)

## 2020-11-28 LAB — APTT
aPTT: 48 seconds — ABNORMAL HIGH (ref 24–36)
aPTT: 66 seconds — ABNORMAL HIGH (ref 24–36)

## 2020-11-28 LAB — MAGNESIUM: Magnesium: 2.3 mg/dL (ref 1.7–2.4)

## 2020-11-28 LAB — PROCALCITONIN: Procalcitonin: 0.9 ng/mL

## 2020-11-28 LAB — HEPARIN LEVEL (UNFRACTIONATED): Heparin Unfractionated: >1.1 IU/mL — ABNORMAL HIGH (ref 0.30–0.70)

## 2020-11-28 NOTE — Progress Notes (Signed)
Daily Progress Note   Patient Name: [name]       Date: 11/28/2020 DOB: 14-Jan-1921  Age: 87 y.o. MRN#: [id number] Attending Physician: [place] Lori J, DO Primary Care Physician: Lori Commons, FNP Admit Date: 11/26/2020  Reason for Consultation/Follow-up: Establishing goals of care  Subjective: Patient is awake alert resting in bed.  She complains of feeling weak.  She does not think she has much of an appetite.  She did rest well overnight.  She complains of abdominal discomfort off and on, denies of acute abdominal pain currently.  Length of Stay: 2  Current Medications: Scheduled Meds:   atorvastatin  10 mg Oral QHS   diltiazem  30 mg Oral TID   levothyroxine  100 mcg Oral Daily   metoprolol tartrate  100 mg Oral BID   mirabegron ER  50 mg Oral QHS   vitamin B-12  1,000 mcg Oral Daily    Continuous Infusions:  sodium chloride 100 mL/hr at 11/26/20 1759   heparin 650 Units/hr (11/28/20 1053)   piperacillin-tazobactam (ZOSYN)  IV 2.25 g (11/28/20 0508)    PRN Meds: acetaminophen **OR** acetaminophen, HYDROcodone-acetaminophen, ondansetron **OR** ondansetron (ZOFRAN) IV  Physical Exam         Thin appearing weak elderly lady resting in bed Appears chronically ill No acute distress Regular work of breathing S1-S2 Abdomen has mild generalized tenderness Awake alert oriented no focal deficits Possible senile purpuric spots on her skin both UE   Vital Signs: BP 120/76 (BP Location: Right Arm)   Pulse 67   Temp 98 F (36.7 C) (Oral)   Resp 18   Ht '5\' 5"'$  (1.651 m)   Wt 50.1 kg   SpO2 97%   BMI 18.38 kg/m  SpO2: SpO2: 97 % O2 Device: O2 Device: Room Air O2 Flow Rate:    Intake/output summary:  Intake/Output Summary (Last 24 hours) at 11/28/2020 1059 Last  data filed at 11/28/2020 0500 Gross per 24 hour  Intake 1377.11 ml  Output 200 ml  Net 1177.11 ml   LBM:   Baseline Weight: Weight: 49.9 kg Most recent weight: Weight: 50.1 kg       Palliative Assessment/Data:      Patient Active Problem List   Diagnosis Date Noted   Abdominal infection ([place]) 11/27/2020   Abdominal pain 11/26/2020  Acute acalculous cholecystitis 11/26/2020   Protein-calorie malnutrition, severe 08/11/2020   Near syncope 08/05/2020   Rapid atrial fibrillation ([place]) 05/26/2020   Acute pulmonary embolism ([facility]) 02/10/2020   Chronic diastolic CHF (congestive heart failure) ([place]) 02/10/2020   New onset atrial fibrillation ([place]) 02/10/2020   Lung nodule 02/10/2020   History of echocardiogram    GERD (gastroesophageal reflux disease)    Diverticulosis    Carotid bruit    IBS (irritable bowel syndrome)    Hypothyroidism    HTN (hypertension)    HLD (hyperlipidemia)    CAD (coronary artery disease)    Atypical chest pain 06/14/2012   Chest pain 02/12/2012   Fatigue 04/22/2011   CAROTID BRUIT 03/19/2009   HYPOTHYROIDISM 08/29/2008   HEMORRHOIDS 08/29/2008   GERD 08/29/2008   DIVERTICULOSIS, COLON 08/29/2008   HYPERLIPIDEMIA 07/04/2008   HYPERTENSION, BENIGN 07/04/2008   CAD, NATIVE VESSEL 07/04/2008   IRRITABLE BOWEL SYNDROME 07/04/2008    Palliative Care Assessment & Plan   Patient Profile:    Assessment:  87 year old lady, known to palliative medicine service seen in a previous hospitalization, has advance care planning documents with a MOST form in [place], past medical history of paroxysmal atrial fibrillation for which she is on anticoagulation with Eliquis, chronic kidney disease stage IIIa, chronic diastolic congestive heart failure and coronary artery disease.  Patient resides at [facility] assisted living facility and was brought in for near syncopal episode.  Patient was found to have a acalculous cholecystitis and a possible fistula on abdominal  CT scan.  General surgery was consulted patient was placed on IV antibiotics and admitted to hospital medicine service.  Palliative consultation for additional support and ongoing goals of care discussions has been requested.  Recommendations/Plan: Continue current mode of care as per general surgery and hospital medicine services. Call placed and discussed with patient's son Mr. [name] and reviewed scope of current hospitalization.  Only other recommendation at this time is to have palliative services begin following the patient at [facility] facility upon discharge to monitor her post discharge course.    Code Status:    Code Status Orders  (From admission, onward)           Start     Ordered   11/26/20 1704  Do not attempt resuscitation (DNR)  Continuous       Question Answer Comment  In the event of cardiac or respiratory ARREST Do not call a "code blue"   In the event of cardiac or respiratory ARREST Do not perform Intubation, CPR, defibrillation or ACLS   In the event of cardiac or respiratory ARREST Use medication by any route, position, wound care, and other measures to relive pain and suffering. May use oxygen, suction and manual treatment of airway obstruction as needed for comfort.   Comments DNR confirmed w/ son      11/26/20 1703           Code Status History     Date Active Date Inactive Code Status Order ID Comments User Context   08/05/2020 0317 08/12/2020 0208 DNR [id number]  Rise Patience, MD ED   05/26/2020 2300 06/01/2020 2233 DNR [id number]  Elwyn Reach, MD Inpatient   02/10/2020 2308 02/18/2020 2157 DNR [id number]  Vianne Bulls, MD Inpatient       Prognosis:  Guarded   Discharge Planning: Recommend palliative services at [facility] facility upon discharge.   Care plan was discussed with son [name] on the phone.    Thank you  for allowing the Palliative Medicine Team to assist in the care of this patient.   Time In: 10 Time Out: 10.25  Total Time 25 Prolonged Time Billed  no       Greater than 50%  of this time was spent counseling and coordinating care related to the above assessment and plan.  Lori Chance, MD  Please contact Palliative Medicine Team phone at [phone] for questions and concerns.

## 2020-11-28 NOTE — Progress Notes (Signed)
Subjective/Chief Complaint: No complaints   Objective: Vital signs in last 24 hours: Temp:  [97.3 F (36.3 C)-98.4 F (36.9 C)] 98 F (36.7 C) (08/20 0339) Pulse Rate:  [49-81] 67 (08/20 0339) Resp:  [16-20] 18 (08/20 0339) BP: (99-141)/(61-87) 120/76 (08/20 0339) SpO2:  [93 %-97 %] 97 % (08/20 0339) Weight:  [50.1 kg] 50.1 kg (08/20 0500)    Intake/Output from previous day: 08/19 0701 - 08/20 0700 In: 1377.1 [P.O.:240; I.V.:1037.1; IV Piggyback:100] Out: 200 [Urine:200] Intake/Output this shift: No intake/output data recorded.  General appearance: alert, cooperative, and elderly Resp: clear to auscultation bilaterally Cardio: regular rate and rhythm GI: soft, non-tender; bowel sounds normal; no masses,  no organomegaly  Lab Results:  Recent Labs    11/27/20 0418 11/28/20 0534  WBC 13.6* 7.5  HGB 14.7 14.4  HCT 44.9 44.6  PLT 178 182   BMET Recent Labs    11/27/20 0418 11/28/20 0534  NA 135 134*  K 4.6 4.1  CL 98 97*  CO2 29 26  GLUCOSE 94 92  BUN 32* 28*  CREATININE 1.54* 1.41*  CALCIUM 8.9 8.7*   PT/INR Recent Labs    11/27/20 0418  LABPROT 16.0*  INR 1.3*   ABG No results for input(s): PHART, HCO3 in the last 72 hours.  Invalid input(s): PCO2, PO2  Studies/Results: US RENAL  Result Date: 11/26/2020 CLINICAL DATA:  Acute kidney injury. EXAM: RENAL / URINARY TRACT ULTRASOUND COMPLETE COMPARISON:  CT a abdomen 11/26/2020 FINDINGS: Markedly limited evaluation. Right Kidney: Renal measurements: 8.7 x 4.6 x 4.3 cm = volume: 90 mL. Echogenicity within normal limits. No mass or hydronephrosis visualized. Left Kidney: Renal measurements: 8.5 x 4.3 x 4.5 cm = volume: 85 mL. Echogenicity within normal limits. There is a 2.6 x 2.5 x 2.3 cm hypoechoic lesion with no associated mural nodularity, septation, vascularity no solid mass. No hydronephrosis visualized. Urinary bladder: Appears normal for degree of bladder distention. Other: None. IMPRESSION:  Markedly limited evaluation with grossly unremarkable kidneys. Electronically Signed   By: Iven Finn M.D.   On: 11/26/2020 18:12   MR 3D Recon At Scanner  Result Date: 11/27/2020 CLINICAL DATA:  Cholecystitis. Question choledochocolonic fistula on CT scan. EXAM: MRI ABDOMEN WITHOUT AND WITH CONTRAST (INCLUDING MRCP) TECHNIQUE: Multiplanar multisequence MR imaging of the abdomen was performed both before and after the administration of intravenous contrast. Heavily T2-weighted images of the biliary and pancreatic ducts were obtained, and three-dimensional MRCP images were rendered by post processing. CONTRAST:  47m GADAVIST GADOBUTROL 1 MMOL/ML IV SOLN COMPARISON:  CT 11/26/2020 and 05/26/2020. FINDINGS: Despite efforts by the technologist and patient, mild motion artifact is present on today's exam and could not be eliminated. This reduces exam sensitivity and specificity. Lower chest: Trace right pleural effusion. The visualized lower chest otherwise appears unremarkable. Hepatobiliary: Transient hypervascularity inferiorly in the left hepatic lobe again noted. No suspicious focal hepatic lesions are identified. There is mild extrahepatic biliary dilatation with the common hepatic duct measuring 10 mm in diameter. No evidence of choledocholithiasis. In correlation with previous CTs, there is gas within the gallbladder lumen which demonstrates diffuse wall thickening. There is no definite gas within the remainder of the biliary system. 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 Pancreas: Unremarkable. No pancreatic ductal dilatation or surrounding inflammatory changes. Spleen: Normal in size without focal abnormality. Adrenals/Urinary Tract: Both adrenal glands appear normal. Stable cyst in the upper pole of the left  kidney. No evidence of enhancing renal mass or hydronephrosis. Stomach/Bowel: There is a periampullary duodenal diverticulum.  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 Vascular/Lymphatic: There are no enlarged abdominal lymph nodes. Aortic and branch vessel atherosclerosis with proximal SMA stenosis, better seen on recent CTA. Other: No evidence of abdominal wall hernia or ascites. Musculoskeletal: No acute or significant osseous findings. Multilevel spondylosis. IMPRESSION: 1. 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 As seen on CT, there is diffuse gallbladder wall thickening which could reflect acalculous cholecystitis. 2. Mild extrahepatic biliary dilatation without evidence of choledocholithiasis. 3. 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 Aortic Atherosclerosis (ICD10-I70.0). Electronically Signed   By: Richardean Sale M.D.   On: 11/27/2020 08:05   MR ABDOMEN MRCP W WO CONTAST  Result Date: 11/27/2020 CLINICAL DATA:  Cholecystitis. Question choledochocolonic fistula on CT scan. EXAM: MRI ABDOMEN WITHOUT AND WITH CONTRAST (INCLUDING MRCP) TECHNIQUE: Multiplanar multisequence MR imaging of the abdomen was performed both before and after the administration of intravenous contrast. Heavily T2-weighted images of the biliary and pancreatic ducts were obtained, and three-dimensional MRCP images were rendered by post processing. CONTRAST:  16m GADAVIST GADOBUTROL 1 MMOL/ML IV SOLN COMPARISON:  CT 11/26/2020 and 05/26/2020. FINDINGS: Despite efforts by the technologist and patient, mild motion artifact is present on today's exam and could not be eliminated. This reduces exam sensitivity and specificity. Lower chest: Trace right pleural effusion. The visualized lower chest otherwise appears unremarkable. Hepatobiliary: Transient hypervascularity inferiorly in the left hepatic lobe again noted. No suspicious focal hepatic lesions are identified. There is mild extrahepatic biliary dilatation with the common  hepatic duct measuring 10 mm in diameter. No evidence of choledocholithiasis. In correlation with previous CTs, there is gas within the gallbladder lumen which demonstrates diffuse wall thickening. There is no definite gas within the remainder of the biliary system. 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 Pancreas: Unremarkable. No pancreatic ductal dilatation or surrounding inflammatory changes. Spleen: Normal in size without focal abnormality. Adrenals/Urinary Tract: Both adrenal glands appear normal. Stable cyst in the upper pole of the left kidney. No evidence of enhancing renal mass or hydronephrosis. Stomach/Bowel: There is a periampullary duodenal diverticulum. 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 Vascular/Lymphatic: There are no enlarged abdominal lymph nodes. Aortic and branch vessel atherosclerosis with proximal SMA stenosis, better seen on recent CTA. Other: No evidence of abdominal wall hernia or ascites. Musculoskeletal: No acute or significant osseous findings. Multilevel spondylosis. IMPRESSION: 1. 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 As seen on CT, there is diffuse gallbladder wall thickening which could reflect acalculous cholecystitis. 2. Mild extrahepatic biliary dilatation without evidence of choledocholithiasis. 3. 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 Aortic Atherosclerosis (ICD10-I70.0). Electronically Signed   By: WRichardean SaleM.D.   On: 11/27/2020 08:05   CT Angio Abd/Pel W and/or Wo Contrast  Addendum Date: 11/26/2020   ADDENDUM REPORT: 11/26/2020 17:27 ADDENDUM: There is a typographical error in the IMPRESSION, #3 should read as follows: 3. 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 Electronically Signed   By: DLucrezia EuropeM.D.   On: 11/26/2020 17:27   Result Date: 11/26/2020 CLINICAL DATA:  Near syncope, acute mesenteric ischemia EXAM: CTA ABDOMEN AND PELVIS WITH CONTRAST TECHNIQUE: Multidetector CT imaging of the abdomen and pelvis was performed using the standard protocol during bolus administration of intravenous contrast. Multiplanar reconstructed images and MIPs were obtained and reviewed to evaluate the vascular anatomy. CONTRAST:  24m OMNIPAQUE IOHEXOL 350 MG/ML SOLN COMPARISON:  05/26/2020 FINDINGS: VASCULAR Coronary calcifications. At least mild cardiomegaly with right atrial enlargement. Aorta: Extensive calcified atheromatous plaque. No aneurysm, dissection, or stenosis. Celiac: Calcified ostial plaque resulting in short segment mild stenosis, patent distally. SMA: Partially calcified plaque extending from the origin approximately 1.7 cm resulting in segmental stenosis of at least moderate severity, the distal vessel mildly atheromatous but patent with classic branch anatomy. Renals: Single left, with calcified ostial plaque over length of approximately 1 cm resulting in at least mild stenosis, patent distally. Single right, with calcified ostial plaque resulting in mild short-segment stenosis, patent distally. IMA: Patent, with short-segment origin stenosis related to aortic wall plaque, and atheromatous irregularity of indeterminate hemodynamic significance approximately 2 cm distally, with patent distal branch anatomy. Inflow: Moderate calcified plaque without high-grade stenosis. Mild tortuosity. Proximal Outflow: Calcified plaque at the left SFA origin resulting in stenosis of at least mild severity, atheromatous but patent in the visualized distal portion. Veins: Patent hepatic veins, portal vein, SVC, splenic vein, bilateral renal veins, iliac confluence and IVC. Review of the MIP images confirms the above findings. NON-VASCULAR Lower chest: No pleural or  pericardial effusion. Hepatobiliary: Transient hepatic attenuation difference in arterial phase scanning, with no discrete lesion evident on venous phase. Calcified subcapsular lesion in segment 7 stable since 02/14/2012. There is a tubular subhepatic process which appears confluent with the hepatic flexure of the colon and has been consistently present on prior studies, containing some gas and fluid , suggestive of gallbladder with cholecystocolonic fistula. There is new marked thickening of the wall of the presumed gallbladder, and mucosal enhancement. No calcified gallstones. Pancreas: Unremarkable. No pancreatic ductal dilatation or surrounding inflammatory changes. Spleen: Normal in size without focal abnormality. Adrenals/Urinary Tract: Adrenal glands unremarkable. 2.2 cm probable cyst, upper pole left kidney, stable since 02/10/2020. No hydronephrosis. Urinary bladder is physiologically distended. Stomach/Bowel: Stomach is incompletely distended. Duodenal diverticulum. The small bowel is decompressed. Appendix not identified. 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 Apparent patulous segment of colon anteriorly at the level of the aortic bifurcation which is stable since 05/26/2020. Innumerable distal descending and sigmoid diverticula without regional inflammatory change. Lymphatic: No abdominal or pelvic adenopathy. Reproductive: Status post hysterectomy. No adnexal masses. Other: Bilateral pelvic phleboliths.  No ascites.  No   free air. Musculoskeletal: Multilevel spondylitic changes in the lumbar spine. Osteitis pubis. No acute fracture or worrisome bone lesion. IMPRESSION: 1. 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 Patent celiac axis and inferior mesenteric artery may provide adequate collateral circulation to the mesentery. 2. Left SFA origin stenosis of possible hemodynamic significance. Correlate with clinical symptomatology and ABIs. 3. 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 4. Descending and sigmoid diverticulosis. Electronically Signed: By: DLucrezia EuropeM.D. On: 11/26/2020 15:13    Anti-infectives: Anti-infectives (From admission, onward)    Start     Dose/Rate Route Frequency Ordered Stop   11/26/20 2200  piperacillin-tazobactam (ZOSYN) IVPB 2.25 g        2.25 g 100 mL/hr over 30 Minutes Intravenous Every 8 hours 11/26/20 1837     11/26/20 1530  piperacillin-tazobactam (ZOSYN) IVPB 3.375 g  3.375 g 100 mL/hr over 30 Minutes Intravenous  Once 11/26/20 1529 11/26/20 1636       Assessment/Plan: s/p * No surgery found * Advance diet Continue abx for cholecystocolonic fistula High risk for surgery. Will follow  LOS: 2 days    [name] 11/28/2020

## 2020-11-28 NOTE — Progress Notes (Addendum)
[place] for heparin  Indication: atrial fibrillation - bridge therapy while apixaban on hold  No Known Allergies  Patient Measurements: Height: '5\' 5"'$  (165.1 cm) Weight: 50.1 kg (110 lb 7.2 oz) IBW/kg (Calculated) : 57 Heparin Dosing Weight: 49.9 kg   Vital Signs: Temp: 98 F (36.7 C) (08/20 0339) Temp Source: Oral (08/20 0339) BP: 120/76 (08/20 0339) Pulse Rate: 67 (08/20 0339)  Labs: Recent Labs    11/26/20 1223 11/26/20 1223 11/26/20 1437 11/26/20 1800 11/27/20 0418 11/27/20 1150 11/28/20 0534  HGB 14.7  --   --   --  14.7  --  14.4  HCT 44.8  --   --   --  44.9  --  44.6  PLT 165  --   --   --  178  --  182  APTT  --    < >  --  42* 82* 102* 48*  LABPROT  --   --   --   --  16.0*  --   --   INR  --   --   --   --  1.3*  --   --   HEPARINUNFRC  --   --   --  >1.10* >1.10*  --  >1.10*  CREATININE 1.47*  --   --   --  1.54*  --  1.41*  TROPONINIHS 9  --  8  --   --   --   --    < > = values in this interval not displayed.     Estimated Creatinine Clearance: 17.6 mL/min (A) (by C-G formula based on SCr of 1.41 mg/dL (H)).   Medical History: Past Medical History:  Diagnosis Date   CAD (coronary artery disease)    a. 07/2007 PCI/Promus DES to LAD;  b. 02/2010 PCI/Promus DES to dRCA.;  c. 12/2010 Lexi MV: EF 88%, no isch/infarct.;  d. 02/2012 Cath: LM nl, LAD 20p, patent mid stent, 60d, D1 sm 70ost, D2 sm, diff, LCX sm , OM1 diff 52m(no change), RCA 40ost, patent stent, PD/PL nl, EF 75% w/o rwma's.   Carotid bruit    Diverticulosis    GERD (gastroesophageal reflux disease)    Hemorrhoids    History of echocardiogram    a. 06/2012 Echo: EF nl, mild LVH.   HLD (hyperlipidemia)    HTN (hypertension)    Hypothyroidism    IBS (irritable bowel syndrome)     Medications:  Apixaban 2.5 mg po BID, last dose 8/18 at 08 am  Assessment: 87yo F on apixaban 2.5 mg po BID PTA for Afib.  Pharmacy consulted to dose heparin for  bridge therapy while apixaban on hold during work up of possible fistula between gallbladder and colon.  Apixa 2.5 po bid last dose 8/18 at 08 am CBC WNL, SCr 1.47  11/28/2020: aPTT 48 sec - decreased to subtherapeutic on heparin 650 units/hr  Heparin level >1.1 as anticipated due to recent apixaban CBC WNL No bleeding issues reported, but RN states IV site is infiltrated.    Goal of Therapy:  Heparin level 0.3-0.7 units/ml aPTT 66 - 102 seconds Monitor platelets by anticoagulation protocol: Yes   Plan:  Continue heparin infusion 650 units/hr - RN to restart new IV site, then check one 8 hour aPTT CBC, aPTT & heparin level daily Will adjust heparin using aPTT until apixaban has cleared.  CGretta ArabPharmD, BCPS Clinical Pharmacist [facility] main pharmacy [phone]/20/2022 7:27 AM

## 2020-11-28 NOTE — Progress Notes (Addendum)
[place] for heparin  Indication: atrial fibrillation - bridge therapy while apixaban on hold  No Known Allergies  Patient Measurements: Height: '5\' 5"'$  (165.1 cm) Weight: 50.1 kg (110 lb 7.2 oz) IBW/kg (Calculated) : 57 Heparin Dosing Weight: 49.9 kg   Vital Signs:    Labs: Recent Labs    11/26/20 1223 11/26/20 1223 11/26/20 1437 11/26/20 1800 11/27/20 0418 11/27/20 1150 11/28/20 0534 11/28/20 1533  HGB 14.7  --   --   --  14.7  --  14.4  --   HCT 44.8  --   --   --  44.9  --  44.6  --   PLT 165  --   --   --  178  --  182  --   APTT  --    < >  --  42* 82* 102* 48* 66*  LABPROT  --   --   --   --  16.0*  --   --   --   INR  --   --   --   --  1.3*  --   --   --   HEPARINUNFRC  --   --   --  >1.10* >1.10*  --  >1.10*  --   CREATININE 1.47*  --   --   --  1.54*  --  1.41*  --   TROPONINIHS 9  --  8  --   --   --   --   --    < > = values in this interval not displayed.     Estimated Creatinine Clearance: 17.6 mL/min (A) (by C-G formula based on SCr of 1.41 mg/dL (H)).  Medications:  Apixaban 2.5 mg po BID, last dose 8/18 at 08 am  Assessment: 87 yo F on apixaban 2.5 mg po BID PTA for Afib.  Pharmacy consulted to dose heparin for bridge therapy while apixaban on hold during work up of possible fistula between gallbladder and colon.  Apixa 2.5 po bid last dose 8/18 at 08 am CBC WNL, SCr 1.47  11/28/2020: aPTT now therapeutic but borderline low on heparin 650 units/hr after IV site replaced Daily heparin level >1.1 as anticipated due to recent apixaban CBC stable WNL No bleeding, and no further IV site issues noted per RN  Goal of Therapy:  Heparin level 0.3-0.7 units/ml aPTT 66 - 102 seconds Monitor platelets by anticoagulation protocol: Yes   Plan:  Continue heparin infusion 650 units/hr - borderline low but pt previously became borderline high on 700 units/hr; suspect with her poor clearance she will take longer to  reach steady state (also note level drawn 1 hr early) CBC, aPTT & heparin level daily Will adjust heparin using aPTT until apixaban has cleared F/u ability to resume [place], PharmD, BCPS [phone] 11/28/2020, 4:51 PM

## 2020-11-28 NOTE — Progress Notes (Signed)
PROGRESS NOTE    [name]  [id number] DOB: Aug 11, 1920 DOA: 11/26/2020 PCP: Holland Commons, FNP    Brief Narrative:  [name] is a 87 year old female with past medical history significant for paroxysmal atrial fibrillation on anticoagulation with Eliquis, CKD stage IIIa, chronic diastolic congestive heart failure, CAD, who presented from [facility] facility via EMS for near syncopal episode.  Patient does not recall event and staff reported patient did not fall and no loss of consciousness.  Patient has been reporting pain to the abdomen without nausea/vomiting/diarrhea.  Denies chest pain.  In the ED, her 91.1 F, HR 89, RR 16, BP 112/68, SPO2 97% on room air.  Sodium 133, potassium 4.2, chloride 96, CO2 29, BUN 33, creatinine 1.47, glucose 128.  AST 26, ALT 16, total bilirubin 0.9.  WBC 16.9, hemoglobin 14.7, platelets 165.  High sensitive troponin 9> 8.  Cova-19 PCR negative.  Influenza A/B PCR negative.  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  General surgery was consulted.  Patient was started on IV antibiotics.  TRH consulted for further evaluation and management.   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  Patient notable with elevated WBC count of 16.9 and procalcitonin 1.89.  LFTs within normal limits. 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  No blood cultures performed in the ED or on admission. --General surgery following, appreciate assistance; not optimal surgical candidate given her advanced age and comorbidities --WBC 16.9>13.6>7.5 --Procalcitonin 1.89>0.90 --Zosyn 2.25 g IV q8h --Follow WBC/procalcitonin daily --Supportive care  Near syncopal episode Etiology likely secondary to abdominal infection as above with likely acalculous cholecystitis.  No fall or loss of consciousness.  TTE 02/11/2020 with LVEF 60 to 65%, no LV R WMA, mild LVH, no aortic valve stenosis.  Carotid duplex ultrasound 08/06/2020 with no significant flow restrictions bilateral carotid arteries. --Continue treatment as above --Continue to monitor on telemetry  AKI on CKD stage IIIa Baseline creatinine 1.05.  Creatinine on admission 1.47.  Suspect prerenal azotemia from abdominal infection as above.  Renal ultrasound, limited exam but grossly unremarkable kidneys. --Cr 1.47>1.54>1.41 --Avoid nephrotoxins, renally dose all medications --Repeat BMP in a.m.  Proximal SMA stenosis CT angiogram abdomen/pelvis notable for proximal SMA stenosis with patent celiac/IMA likely providing collateral circulation to the mesentery.  Likely chronic. --Continue atorvastatin 10 mg p.o. daily  Left SFA origin stenosis CT angiogram abdomen/pelvis notable for left SFA stenosis.  Patient not complaining of claudication or leg pain.  Hold on obtaining ABIs for now given not optimal surgical candidate.  Paroxysmal atrial fibrillation EKG on admission with atrial fibrillation, rate 61; no concerning ischemic findings.  High sensitive troponin within normal limits x2.  Chest x-ray with no acute  cardiopulmonary disease process. --Holding Eliquis, on heparin drip --Decrease diltiazem to 30 mg p.o. 3 times daily given  borderline low heart rate (hold for SBP <100 or HR<60)  HLD: Atorvastatin 10 mg p.o. daily  Hypothyroidism: Levothyroxine 100 mcg p.o. daily   DVT prophylaxis: Heparin drip   Code Status: DNR Family Communication: No family present at bedside this morning, attempted to update patient's son, but via telephone unsuccessful.  Voicemail left.  Disposition Plan:  Level of care: Telemetry Status is: Inpatient  Remains inpatient appropriate because:Ongoing active pain requiring inpatient pain management, Ongoing diagnostic testing needed not appropriate for outpatient work up, Unsafe d/c plan, IV treatments appropriate due to intensity of illness or inability to take PO, and Inpatient level of care appropriate due to severity of illness  Dispo: The patient is from: ALF              Anticipated d/c is to: ALF              Patient currently is not medically stable to d/c.   Difficult to place patient No  Consultants:  General surgery  Procedures:  None  Antimicrobials:  Zosyn 8/19   Subjective: Patient seen examined bedside, resting comfortably.  Sitting on bedside commode.  Continues to feel weak/fatigue, abdominal pain improved.  White blood cell count now normalized.  Seen by general surgery this morning with recommendations of conservative management with IV antibiotics.  Currently no plan for surgical intervention.  No family present at bedside this morning.  Denies headache, no visual changes, no chest pain, no palpitations, no shortness of breath, no cough/congestion, no fever/chills/night sweats, no nausea/vomiting/diarrhea.  No acute events overnight per nursing staff.  Objective: Vitals:   11/27/20 2319 11/27/20 2322 11/28/20 0339 11/28/20 0500  BP: 111/83  120/76   Pulse: (!) 49 81 67   Resp: 16  18   Temp: (!) 97.3 F (36.3 C)  98 F (36.7 C)    TempSrc: Oral  Oral   SpO2: 97%  97%   Weight:    50.1 kg  Height:        Intake/Output Summary (Last 24 hours) at 11/28/2020 1338 Last data filed at 11/28/2020 0500 Gross per 24 hour  Intake 1377.11 ml  Output 200 ml  Net 1177.11 ml   Filed Weights   11/26/20 1158 11/28/20 0500  Weight: 49.9 kg 50.1 kg    Examination:  General exam: Appears calm and comfortable, elderly and chronically ill in appearance Respiratory system: Clear to auscultation. Respiratory effort normal.  On room air Cardiovascular system: S1 & S2 heard, RRR. No JVD, murmurs, rubs, gallops or clicks. No pedal edema. Gastrointestinal system: Abdomen is nondistended, soft, NTTP. No organomegaly or masses felt. Normal bowel sounds heard. Central nervous system: Alert and oriented. No focal neurological deficits. Extremities: Symmetric 5 x 5 power. Skin: Multiple areas of ecchymosis in various stages of healing to extremities Psychiatry: Judgement and insight appear normal. Mood & affect appropriate.     Data Reviewed: I have personally reviewed following labs and imaging studies  CBC: Recent Labs  Lab 11/26/20 1223 11/27/20 0418 11/28/20 0534  WBC 16.9* 13.6* 7.5  NEUTROABS 14.0*  --   --   HGB 14.7 14.7 14.4  HCT 44.8 44.9 44.6  MCV 98.2 98.0 98.0  PLT 165 178 [id number]   Basic Metabolic Panel: Recent Labs  Lab 11/26/20 1223 11/27/20 0418 11/28/20 0534  NA 133* 135 134*  K 4.2 4.6 4.1  CL 96* 98 97*  CO2 '29 29 26  '$ GLUCOSE 128* 94 92  BUN 33* 32* 28*  CREATININE 1.47*  1.54* 1.41*  CALCIUM 9.0 8.9 8.7*  MG  --   --  2.3   GFR: Estimated Creatinine Clearance: 17.6 mL/min (A) (by C-G formula based on SCr of 1.41 mg/dL (H)). Liver Function Tests: Recent Labs  Lab 11/26/20 1223 11/27/20 0418  AST 26 24  ALT 16 12  ALKPHOS 71 67  BILITOT 0.9 1.2  PROT 7.3 6.9  ALBUMIN 3.4* 3.1*   No results for input(s): LIPASE, AMYLASE in the last 168 hours. No results for input(s): AMMONIA in the last  168 hours. Coagulation Profile: Recent Labs  Lab 11/27/20 0418  INR 1.3*   Cardiac Enzymes: No results for input(s): CKTOTAL, CKMB, CKMBINDEX, TROPONINI in the last 168 hours. BNP (last 3 results) No results for input(s): PROBNP in the last 8760 hours. HbA1C: No results for input(s): HGBA1C in the last 72 hours. CBG: Recent Labs  Lab 11/26/20 1159  GLUCAP 139*   Lipid Profile: No results for input(s): CHOL, HDL, LDLCALC, TRIG, CHOLHDL, LDLDIRECT in the last 72 hours. Thyroid Function Tests: No results for input(s): TSH, T4TOTAL, FREET4, T3FREE, THYROIDAB in the last 72 hours. Anemia Panel: No results for input(s): VITAMINB12, FOLATE, FERRITIN, TIBC, IRON, RETICCTPCT in the last 72 hours. Sepsis Labs: Recent Labs  Lab 11/26/20 1708 11/27/20 0418 11/28/20 0534  PROCALCITON  --  1.89 0.90  LATICACIDVEN 1.5  --   --     Recent Results (from the past 240 hour(s))  Resp Panel by RT-PCR (Flu A&B, Covid) Nasopharyngeal Swab     Status: None   Collection Time: 11/26/20  4:40 PM   Specimen: Nasopharyngeal Swab; Nasopharyngeal(NP) swabs in vial transport medium  Result Value Ref Range Status   SARS Coronavirus 2 by RT PCR NEGATIVE NEGATIVE Final    Comment: (NOTE) SARS-CoV-2 target nucleic acids are NOT DETECTED.  The SARS-CoV-2 RNA is generally detectable in upper respiratory specimens during the acute phase of infection. The lowest concentration of SARS-CoV-2 viral copies this assay can detect is 138 copies/mL. A negative result does not preclude SARS-Cov-2 infection and should not be used as the sole basis for treatment or other patient management decisions. A negative result may occur with  improper specimen collection/handling, submission of specimen other than nasopharyngeal swab, presence of viral mutation(s) within the areas targeted by this assay, and inadequate number of viral copies(<138 copies/mL). A negative result must be combined with clinical observations,  patient history, and epidemiological information. The expected result is Negative.  Fact Sheet for Patients:  EntrepreneurPulse.com.au  Fact Sheet for Healthcare Providers:  IncredibleEmployment.be  This test is no t yet approved or cleared by the [place] FDA and  has been authorized for detection and/or diagnosis of SARS-CoV-2 by FDA under an Emergency Use Authorization (EUA). This EUA will remain  in effect (meaning this test can be used) for the duration of the COVID-19 declaration under Section 564(b)(1) of the Act, 21 U.S.C.section 360bbb-3(b)(1), unless the authorization is terminated  or revoked sooner.       Influenza A by PCR NEGATIVE NEGATIVE Final   Influenza B by PCR NEGATIVE NEGATIVE Final    Comment: (NOTE) The Xpert Xpress SARS-CoV-2/FLU/RSV plus assay is intended as an aid in the diagnosis of influenza from Nasopharyngeal swab specimens and should not be used as a sole basis for treatment. Nasal washings and aspirates are unacceptable for Xpert Xpress SARS-CoV-2/FLU/RSV testing.  Fact Sheet for Patients: EntrepreneurPulse.com.au  Fact Sheet for Healthcare Providers: IncredibleEmployment.be  This test is not yet approved or cleared  by the [place] and has been authorized for detection and/or diagnosis of SARS-CoV-2 by FDA under an Emergency Use Authorization (EUA). This EUA will remain in effect (meaning this test can be used) for the duration of the COVID-19 declaration under Section 564(b)(1) of the Act, 21 U.S.C. section 360bbb-3(b)(1), unless the authorization is terminated or revoked.  Performed at [facility], [place] [address], [place], [place] [address]          Radiology Studies: US RENAL  Result Date: 11/26/2020 CLINICAL DATA:  Acute kidney injury. EXAM: RENAL / URINARY TRACT ULTRASOUND COMPLETE COMPARISON:  CT a abdomen 11/26/2020  FINDINGS: Markedly limited evaluation. Right Kidney: Renal measurements: 8.7 x 4.6 x 4.3 cm = volume: 90 mL. Echogenicity within normal limits. No mass or hydronephrosis visualized. Left Kidney: Renal measurements: 8.5 x 4.3 x 4.5 cm = volume: 85 mL. Echogenicity within normal limits. There is a 2.6 x 2.5 x 2.3 cm hypoechoic lesion with no associated mural nodularity, septation, vascularity no solid mass. No hydronephrosis visualized. Urinary bladder: Appears normal for degree of bladder distention. Other: None. IMPRESSION: Markedly limited evaluation with grossly unremarkable kidneys. Electronically Signed   By: Iven Finn M.D.   On: 11/26/2020 18:12   MR 3D Recon At Scanner  Result Date: 11/27/2020 CLINICAL DATA:  Cholecystitis. Question choledochocolonic fistula on CT scan. EXAM: MRI ABDOMEN WITHOUT AND WITH CONTRAST (INCLUDING MRCP) TECHNIQUE: Multiplanar multisequence MR imaging of the abdomen was performed both before and after the administration of intravenous contrast. Heavily T2-weighted images of the biliary and pancreatic ducts were obtained, and three-dimensional MRCP images were rendered by post processing. CONTRAST:  58m GADAVIST GADOBUTROL 1 MMOL/ML IV SOLN COMPARISON:  CT 11/26/2020 and 05/26/2020. FINDINGS: Despite efforts by the technologist and patient, mild motion artifact is present on today's exam and could not be eliminated. This reduces exam sensitivity and specificity. Lower chest: Trace right pleural effusion. The visualized lower chest otherwise appears unremarkable. Hepatobiliary: Transient hypervascularity inferiorly in the left hepatic lobe again noted. No suspicious focal hepatic lesions are identified. There is mild extrahepatic biliary dilatation with the common hepatic duct measuring 10 mm in diameter. No evidence of choledocholithiasis. In correlation with previous CTs, there is gas within the gallbladder lumen which demonstrates diffuse wall thickening. There is no  definite gas within the remainder of the biliary system. 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 Pancreas: Unremarkable. No pancreatic ductal dilatation or surrounding inflammatory changes. Spleen: Normal in size without focal abnormality. Adrenals/Urinary Tract: Both adrenal glands appear normal. Stable cyst in the upper pole of the left kidney. No evidence of enhancing renal mass or hydronephrosis. Stomach/Bowel: There is a periampullary duodenal diverticulum. 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 Vascular/Lymphatic: There are no enlarged abdominal lymph nodes. Aortic and branch vessel atherosclerosis with proximal SMA stenosis, better seen on recent CTA. Other: No evidence of abdominal wall hernia or ascites. Musculoskeletal: No acute or significant osseous findings. Multilevel spondylosis. IMPRESSION: 1. 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 As seen on CT, there is diffuse gallbladder wall thickening which could reflect acalculous cholecystitis. 2. Mild extrahepatic biliary dilatation without evidence of choledocholithiasis. 3. 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 Aortic Atherosclerosis (ICD10-I70.0). Electronically Signed   By: WRichardean SaleM.D.   On: 11/27/2020 08:05   MR  ABDOMEN MRCP W WO CONTAST  Result Date: 11/27/2020 CLINICAL DATA:  Cholecystitis. Question choledochocolonic fistula on CT scan. EXAM: MRI ABDOMEN WITHOUT AND WITH CONTRAST (INCLUDING MRCP) TECHNIQUE: Multiplanar multisequence MR imaging of the abdomen was performed both before and after the administration of intravenous contrast. Heavily T2-weighted images of the biliary and pancreatic ducts were obtained, and three-dimensional MRCP images were rendered by post processing. CONTRAST:  16m GADAVIST GADOBUTROL 1 MMOL/ML IV  SOLN COMPARISON:  CT 11/26/2020 and 05/26/2020. FINDINGS: Despite efforts by the technologist and patient, mild motion artifact is present on today's exam and could not be eliminated. This reduces exam sensitivity and specificity. Lower chest: Trace right pleural effusion. The visualized lower chest otherwise appears unremarkable. Hepatobiliary: Transient hypervascularity inferiorly in the left hepatic lobe again noted. No suspicious focal hepatic lesions are identified. There is mild extrahepatic biliary dilatation with the common hepatic duct measuring 10 mm in diameter. No evidence of choledocholithiasis. In correlation with previous CTs, there is gas within the gallbladder lumen which demonstrates diffuse wall thickening. There is no definite gas within the remainder of the biliary system. 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 Pancreas: Unremarkable. No pancreatic ductal dilatation or surrounding inflammatory changes. Spleen: Normal in size without focal abnormality. Adrenals/Urinary Tract: Both adrenal glands appear normal. Stable cyst in the upper pole of the left kidney. No evidence of enhancing renal mass or hydronephrosis. Stomach/Bowel: There is a periampullary duodenal diverticulum. 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 Vascular/Lymphatic: There are no enlarged abdominal lymph nodes. Aortic and branch vessel atherosclerosis with proximal SMA stenosis, better seen on recent CTA. Other: No evidence of abdominal wall hernia or ascites. Musculoskeletal: No acute or significant osseous findings. Multilevel spondylosis. IMPRESSION: 1. 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 As seen on CT, there is diffuse gallbladder wall thickening which could reflect acalculous cholecystitis. 2. Mild extrahepatic  biliary dilatation without evidence of choledocholithiasis. 3. 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 Aortic Atherosclerosis (ICD10-I70.0). Electronically Signed   By: WRichardean SaleM.D.   On: 11/27/2020 08:05   CT Angio Abd/Pel W and/or Wo Contrast  Addendum Date: 11/26/2020   ADDENDUM REPORT: 11/26/2020 17:27 ADDENDUM: There is a typographical error in the IMPRESSION, #3 should read as follows: 3. 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 Electronically Signed   By: DLucrezia EuropeM.D.   On: 11/26/2020 17:27   Result Date: 11/26/2020 CLINICAL DATA:  Near syncope, acute mesenteric ischemia EXAM: CTA ABDOMEN AND PELVIS WITH CONTRAST TECHNIQUE: Multidetector CT imaging of the abdomen and pelvis was performed using the standard protocol during bolus administration of intravenous contrast. Multiplanar reconstructed images and MIPs were obtained and reviewed to evaluate the vascular anatomy. CONTRAST:  617mOMNIPAQUE IOHEXOL 350 MG/ML SOLN COMPARISON:  05/26/2020 FINDINGS: VASCULAR Coronary calcifications. At least mild cardiomegaly with right atrial enlargement. Aorta: Extensive calcified atheromatous plaque. No aneurysm, dissection, or stenosis. Celiac: Calcified ostial plaque resulting in short segment mild stenosis, patent distally. SMA: Partially calcified plaque extending from the origin approximately 1.7 cm resulting in segmental stenosis of at least moderate severity, the distal vessel mildly atheromatous but patent with classic branch anatomy. Renals: Single left, with calcified ostial plaque over length of approximately 1 cm resulting in at least mild stenosis, patent distally. Single  right, with calcified ostial plaque resulting in mild short-segment stenosis, patent distally. IMA: Patent, with short-segment origin stenosis related to aortic wall plaque, and atheromatous irregularity of indeterminate  hemodynamic significance approximately 2 cm distally, with patent distal branch anatomy. Inflow: Moderate calcified plaque without high-grade stenosis. Mild tortuosity. Proximal Outflow: Calcified plaque at the left SFA origin resulting in stenosis of at least mild severity, atheromatous but patent in the visualized distal portion. Veins: Patent hepatic veins, portal vein, SVC, splenic vein, bilateral renal veins, iliac confluence and IVC. Review of the MIP images confirms the above findings. NON-VASCULAR Lower chest: No pleural or pericardial effusion. Hepatobiliary: Transient hepatic attenuation difference in arterial phase scanning, with no discrete lesion evident on venous phase. Calcified subcapsular lesion in segment 7 stable since 02/14/2012. There is a tubular subhepatic process which appears confluent with the hepatic flexure of the colon and has been consistently present on prior studies, containing some gas and fluid , suggestive of gallbladder with cholecystocolonic fistula. There is new marked thickening of the wall of the presumed gallbladder, and mucosal enhancement. No calcified gallstones. Pancreas: Unremarkable. No pancreatic ductal dilatation or surrounding inflammatory changes. Spleen: Normal in size without focal abnormality. Adrenals/Urinary Tract: Adrenal glands unremarkable. 2.2 cm probable cyst, upper pole left kidney, stable since 02/10/2020. No hydronephrosis. Urinary bladder is physiologically distended. Stomach/Bowel: Stomach is incompletely distended. Duodenal diverticulum. The small bowel is decompressed. Appendix not identified. 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 Apparent patulous segment of colon anteriorly at the level of the aortic bifurcation which is stable since 05/26/2020. Innumerable distal descending and sigmoid diverticula without regional inflammatory change. Lymphatic: No abdominal or pelvic adenopathy. Reproductive: Status post hysterectomy. No  adnexal masses. Other: Bilateral pelvic phleboliths.  No ascites.  No   free air. Musculoskeletal: Multilevel spondylitic changes in the lumbar spine. Osteitis pubis. No acute fracture or worrisome bone lesion. IMPRESSION: 1. 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 Patent celiac axis and inferior mesenteric artery may provide adequate collateral circulation to the mesentery. 2. Left SFA origin stenosis of possible hemodynamic significance. Correlate with clinical symptomatology and ABIs. 3. 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 4. Descending and sigmoid diverticulosis. Electronically Signed: By: Lucrezia Europe M.D. On: 11/26/2020 15:13        Scheduled Meds:  atorvastatin  10 mg Oral QHS   diltiazem  30 mg Oral TID   levothyroxine  100 mcg Oral Daily   metoprolol tartrate  100 mg Oral BID   mirabegron ER  50 mg Oral QHS   vitamin B-12  1,000 mcg Oral Daily   Continuous Infusions:  sodium chloride 100 mL/hr at 11/26/20 1759   heparin 650 Units/hr (11/28/20 1231)   piperacillin-tazobactam (ZOSYN)  IV 2.25 g (11/28/20 0508)     LOS: 2 days    Time spent: 39 minutes spent on chart review, discussion with nursing staff, consultants, updating family and interview/physical exam; more than 50% of that time was spent in counseling and/or coordination of care.    Draven Laine J [place] DO [facility] Available via Epic secure chat 7am-7pm After these hours, please refer to coverage provider listed on [email or web address] 11/28/2020, 1:38 PM

## 2020-11-28 NOTE — Plan of Care (Signed)

## 2020-11-29 DIAGNOSIS — I739 Peripheral vascular disease, unspecified: Secondary | ICD-10-CM | POA: Diagnosis present

## 2020-11-29 DIAGNOSIS — K823 Fistula of gallbladder: Secondary | ICD-10-CM | POA: Diagnosis present

## 2020-11-29 DIAGNOSIS — K551 Chronic vascular disorders of intestine: Secondary | ICD-10-CM | POA: Diagnosis present

## 2020-11-29 LAB — BASIC METABOLIC PANEL
Anion gap: 8 (ref 5–15)
BUN: 24 mg/dL — ABNORMAL HIGH (ref 8–23)
CO2: 27 mmol/L (ref 22–32)
Calcium: 8.8 mg/dL — ABNORMAL LOW (ref 8.9–10.3)
Chloride: 100 mmol/L (ref 98–111)
Creatinine, Ser: 1.45 mg/dL — ABNORMAL HIGH (ref 0.44–1.00)
GFR, Estimated: 33 mL/min — ABNORMAL LOW (ref >60)
Glucose, Bld: 87 mg/dL (ref 70–99)
Potassium: 4.3 mmol/L (ref 3.5–5.1)
Sodium: 135 mmol/L (ref 135–145)

## 2020-11-29 LAB — HEPARIN LEVEL (UNFRACTIONATED): Heparin Unfractionated: >1.1 IU/mL — ABNORMAL HIGH (ref 0.30–0.70)

## 2020-11-29 LAB — CBC
HCT: 45.2 % (ref 36.0–46.0)
Hemoglobin: 14.5 g/dL (ref 12.0–15.0)
MCH: 31.7 pg (ref 26.0–34.0)
MCHC: 32.1 g/dL (ref 30.0–36.0)
MCV: 98.7 fL (ref 80.0–100.0)
Platelets: 197 10*3/uL (ref 150–400)
RBC: 4.58 MIL/uL (ref 3.87–5.11)
RDW: 13.2 % (ref 11.5–15.5)
WBC: 7.3 10*3/uL (ref 4.0–10.5)
nRBC: 0 % (ref 0.0–0.2)

## 2020-11-29 LAB — APTT: aPTT: 72 seconds — ABNORMAL HIGH (ref 24–36)

## 2020-11-29 LAB — PROCALCITONIN: Procalcitonin: 0.47 ng/mL

## 2020-11-29 MED ORDER — POLYVINYL ALCOHOL 1.4 % OP SOLN
2.0000 [drp] | OPHTHALMIC | Status: DC | PRN
  Filled 2020-11-29: qty 15

## 2020-11-29 MED ORDER — ENSURE ENLIVE PO LIQD
237.0000 mL | Freq: Two times a day (BID) | ORAL | Status: DC
  Administered 2020-11-29 – 2020-12-02 (×6): 237 mL via ORAL

## 2020-11-29 MED ORDER — OFLOXACIN 0.3 % OP SOLN
1.0000 [drp] | Freq: Four times a day (QID) | OPHTHALMIC | Status: DC
  Administered 2020-11-29 – 2020-12-03 (×17): 1 [drp] via OPHTHALMIC
  Filled 2020-11-29: qty 5

## 2020-11-29 MED ORDER — POLYVINYL ALCOHOL 1.4 % OP SOLN: 1.0000 [drp] | OPHTHALMIC | Status: DC | PRN

## 2020-11-29 MED ORDER — ADULT MULTIVITAMIN W/MINERALS CH
1.0000 | ORAL_TABLET | Freq: Every day | ORAL | Status: DC
  Administered 2020-11-29 – 2020-12-03 (×5): 1 via ORAL
  Filled 2020-11-29 (×5): qty 1

## 2020-11-29 NOTE — Progress Notes (Signed)
Initial Nutrition Assessment  DOCUMENTATION CODES:   Not applicable  INTERVENTION:   -Ensure Enlive po BID, each supplement provides 350 kcal and 20 grams of protein  -Magic cup BID with meals, each supplement provides 290 kcal and 9 grams of protein  -MVI with minerals daily  NUTRITION DIAGNOSIS:   Increased nutrient needs related to acute illness as evidenced by estimated needs.  GOAL:   Patient will meet greater than or equal to 90% of their needs  MONITOR:   PO intake, Supplement acceptance, Diet advancement, Labs, Weight trends, Skin, I & O's  REASON FOR ASSESSMENT:   Malnutrition Screening Tool    ASSESSMENT:   [name] is a 87 y.o. female with medical history significant of a fib, CKD3a,  HFpEF, CAD. Presenting with near syncope. Patient is a poor historian. History primarily from chart review. The patient has had intermittent, nondescript abdominal pain over the last couple of month. It seems to come at any time per her report. It simply hurts. This morning she apparently was having an episode of abdominal pain and felt weak. She did not loss consciousness. She was sent to the ED for evaluation of her weakness and abdominal pain.  Pt admitted with abdominal pain, choledochocolonic fistula, and acalculous fistula.    8/19- s/p MRCP- likely no colecysto-colonic fistula present  Reviewed I/O's: -350 ml x 24 hours and +916 ml since admission  UOP: 350 ml x 24 hours  Per general surgery notes, pt is a poor operative candidate. Plan to continue antibiotics.  Pt unavailable at time of visit. Attempted to speak with pt via call to hospital room phone, however, unable to reach. RD unable to obtain further nutrition-related history or complete nutrition-focused physical exam at this time.    Pt with good meal completions, documented at 100%.   Medications reviewed and include cardizem and vitamin B-12.   Reviewed wt hx; pt has experienced a 7% wt loss over the  past 6 months, which is not significant for time frame.   Palliative care following; plan for outpatient palliative care follow-up.   Labs reviewed.   Diet Order:   Diet Order             DIET - DYS 1 Room service appropriate? Yes; Fluid consistency: Thin  Diet effective now                   EDUCATION NEEDS:   No education needs have been identified at this time  Skin:  Skin Assessment: Reviewed RN Assessment  Last BM:  Unknown  Height:   Ht Readings from Last 1 Encounters:  11/26/20 '5\' 5"'$  (1.651 m)    Weight:   Wt Readings from Last 1 Encounters:  11/29/20 53 kg    Ideal Body Weight:  56.8 kg  BMI:  Body mass index is 19.44 kg/m.  Estimated Nutritional Needs:   Kcal:  1350-1550  Protein:  65-80 grams  Fluid:  > 1.3 L    Loistine Chance, RD, LDN, [place] Registered Dietitian II Certified Diabetes Care and Education Specialist Please refer to [facility] for RD and/or RD on-call/weekend/after hours pager

## 2020-11-29 NOTE — Progress Notes (Signed)
PROGRESS NOTE    ALOURA STEPPER  [id number] DOB: [birth year] DOA: 11/26/2020 PCP: Holland Commons, FNP    Brief Narrative:  [name] is a 87 year old female with past medical history significant for paroxysmal atrial fibrillation on anticoagulation with Eliquis, CKD stage IIIa, chronic diastolic congestive heart failure, CAD, who presented from [facility] assisted living facility via EMS for near syncopal episode.  Patient does not recall event and staff reported patient did not fall and no loss of consciousness.  Patient has been reporting pain to the abdomen without nausea/vomiting/diarrhea.  Denies chest pain.  In the ED, her 91.1 F, HR 89, RR 16, BP 112/68, SPO2 97% on room air.  Sodium 133, potassium 4.2, chloride 96, CO2 29, BUN 33, creatinine 1.47, glucose 128.  AST 26, ALT 16, total bilirubin 0.9.  WBC 16.9, hemoglobin 14.7, platelets 165.  High sensitive troponin 9> 8.  Cova-19 PCR negative.  Influenza A/B PCR negative.  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  General surgery was consulted.  Patient was started on IV antibiotics.  TRH consulted for further evaluation and management.   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  Patient notable with elevated WBC count of 16.9 and procalcitonin 1.89.  LFTs within normal limits. 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  No blood cultures performed in the ED or on admission. --General surgery following, appreciate assistance; not optimal surgical candidate given her advanced age and comorbidities --WBC 16.9>13.6>7.5>7.3 --Procalcitonin 1.89>0.90>0.47 --Zosyn 2.25 g IV q8h --Follow WBC daily --Supportive care --Given continues with abdominal discomfort, awaiting further general surgery recommendations regarding possible HIDA scan and need for percutaneous cholecystostomy placement for definitive care if not an operative candidate.  Near syncopal episode Etiology likely secondary to abdominal infection as above with likely acalculous cholecystitis.  No fall or loss of consciousness.  TTE 02/11/2020 with LVEF 60 to 65%, no LV R WMA, mild LVH, no aortic valve stenosis.  Carotid duplex ultrasound 08/06/2020 with no significant flow restrictions bilateral carotid arteries. --Continue treatment as above --Continue to monitor on telemetry  AKI on CKD stage IIIa Baseline creatinine 1.05.  Creatinine on admission 1.47.  Suspect prerenal azotemia from abdominal infection as above.  Renal ultrasound, limited exam but grossly unremarkable kidneys. --Cr 1.47>1.54>1.41>1.45 --Avoid nephrotoxins, renally dose all medications --Repeat BMP in a.m. --Continue to monitor bladder scans as needed  Proximal SMA stenosis CT angiogram abdomen/pelvis notable for proximal SMA stenosis with patent celiac/IMA likely providing collateral circulation to the mesentery.  Likely chronic. --Continue atorvastatin 10 mg p.o. daily  Left SFA origin stenosis CT angiogram abdomen/pelvis notable for left SFA stenosis.  Patient not complaining of claudication or leg  pain.  Hold on obtaining ABIs for now given not optimal surgical candidate.  Bacterial conjunctivitis, OU Patient reports matted eyelids, slightly blurred vision and discharge  from bilateral eyes with associated subconjunctival erythema. --Ocuflox eyedrops 4 times daily x7 days  Paroxysmal atrial fibrillation EKG on admission with atrial fibrillation, rate 61; no concerning ischemic findings.  High sensitive troponin within normal limits x2.  Chest x-ray with no acute cardiopulmonary disease process. --Holding Eliquis, on heparin drip --Decrease diltiazem to 30 mg p.o. 3 times daily given borderline low heart rate (hold for SBP <100 or HR<60)  HLD: Atorvastatin 10 mg p.o. daily  Hypothyroidism: Levothyroxine 100 mcg p.o. daily   DVT prophylaxis: Heparin drip   Code Status: DNR Family Communication: No family present at bedside this morning, attempted to update patient's son, but via telephone unsuccessful.  Voicemail left.  Disposition Plan:  Level of care: Telemetry Status is: Inpatient  Remains inpatient appropriate because:Ongoing active pain requiring inpatient pain management, Ongoing diagnostic testing needed not appropriate for outpatient work up, Unsafe d/c plan, IV treatments appropriate due to intensity of illness or inability to take PO, and Inpatient level of care appropriate due to severity of illness  Dispo: The patient is from: ALF              Anticipated d/c is to: ALF              Patient currently is not medically stable to d/c.   Difficult to place patient No  Consultants:  General surgery  Procedures:  None  Antimicrobials:  Zosyn 8/19 Ocuflox eyedrops 8/21>>   Subjective: Patient seen examined bedside, resting comfortably.  Lying in bed.  Continues with generalized abdominal pain, worse right upper quadrant.  No significant change.  Reports matted eyelids bilaterally with associated mildly blurred vision and itching. No family present at bedside this  morning.  Denies headache, no visual changes, no chest pain, no palpitations, no shortness of breath, no cough/congestion, no fever/chills/night sweats, no nausea/vomiting/diarrhea.  No acute events overnight per nursing staff.  Objective: Vitals:   11/28/20 0500 11/28/20 2145 11/29/20 0623 11/29/20 0700  BP:  125/77 116/71   Pulse:  81 71   Resp:  20 18   Temp:  98.5 F (36.9 C) 97.6 F (36.4 C)   TempSrc:  Oral Oral   SpO2:  96% 97%   Weight: 50.1 kg   53 kg  Height:        Intake/Output Summary (Last 24 hours) at 11/29/2020 1224 Last data filed at 11/28/2020 2300 Gross per 24 hour  Intake --  Output 350 ml  Net -350 ml   Filed Weights   11/26/20 1158 11/28/20 0500 11/29/20 0700  Weight: 49.9 kg 50.1 kg 53 kg    Examination:  General exam: Appears calm and comfortable, elderly and chronically ill in appearance Respiratory system: Clear to auscultation. Respiratory effort normal.  On room air Cardiovascular system: S1 & S2 heard, RRR. No JVD, murmurs, rubs, gallops or clicks. No pedal edema. Gastrointestinal system: Abdomen is nondistended, soft, NTTP. No organomegaly or masses felt. Normal bowel sounds heard. Central nervous system: Alert and oriented. No focal neurological deficits. Extremities: Symmetric 5 x 5 power. Skin: Multiple areas of ecchymosis in various stages of healing to extremities Psychiatry: Judgement and insight appear normal. Mood & affect appropriate.     Data Reviewed: I have personally reviewed following labs and imaging studies  CBC: Recent Labs  Lab 11/26/20 1223 11/27/20 0418 11/28/20 0534 11/29/20 0501  WBC 16.9* 13.6* 7.5 7.3  NEUTROABS 14.0*  --   --   --   HGB 14.7 14.7 14.4 14.5  HCT  44.8 44.9 44.6 45.2  MCV 98.2 98.0 98.0 98.7  PLT 165 178 182 [id number]   Basic Metabolic Panel: Recent Labs  Lab 11/26/20 1223 11/27/20 0418 11/28/20 0534 11/29/20 0501  NA 133* 135 134* 135  K 4.2 4.6 4.1 4.3  CL 96* 98 97* 100  CO2 '29 29 26 27   '$ GLUCOSE 128* 94 92 87  BUN 33* 32* 28* 24*  CREATININE 1.47* 1.54* 1.41* 1.45*  CALCIUM 9.0 8.9 8.7* 8.8*  MG  --   --  2.3  --    GFR: Estimated Creatinine Clearance: 18.1 mL/min (A) (by C-G formula based on SCr of 1.45 mg/dL (H)). Liver Function Tests: Recent Labs  Lab 11/26/20 1223 11/27/20 0418  AST 26 24  ALT 16 12  ALKPHOS 71 67  BILITOT 0.9 1.2  PROT 7.3 6.9  ALBUMIN 3.4* 3.1*   No results for input(s): LIPASE, AMYLASE in the last 168 hours. No results for input(s): AMMONIA in the last 168 hours. Coagulation Profile: Recent Labs  Lab 11/27/20 0418  INR 1.3*   Cardiac Enzymes: No results for input(s): CKTOTAL, CKMB, CKMBINDEX, TROPONINI in the last 168 hours. BNP (last 3 results) No results for input(s): PROBNP in the last 8760 hours. HbA1C: No results for input(s): HGBA1C in the last 72 hours. CBG: Recent Labs  Lab 11/26/20 1159  GLUCAP 139*   Lipid Profile: No results for input(s): CHOL, HDL, LDLCALC, TRIG, CHOLHDL, LDLDIRECT in the last 72 hours. Thyroid Function Tests: No results for input(s): TSH, T4TOTAL, FREET4, T3FREE, THYROIDAB in the last 72 hours. Anemia Panel: No results for input(s): VITAMINB12, FOLATE, FERRITIN, TIBC, IRON, RETICCTPCT in the last 72 hours. Sepsis Labs: Recent Labs  Lab 11/26/20 1708 11/27/20 0418 11/28/20 0534 11/29/20 0501  PROCALCITON  --  1.89 0.90 0.47  LATICACIDVEN 1.5  --   --   --     Recent Results (from the past 240 hour(s))  Resp Panel by RT-PCR (Flu A&B, Covid) Nasopharyngeal Swab     Status: None   Collection Time: 11/26/20  4:40 PM   Specimen: Nasopharyngeal Swab; Nasopharyngeal(NP) swabs in vial transport medium  Result Value Ref Range Status   SARS Coronavirus 2 by RT PCR NEGATIVE NEGATIVE Final    Comment: (NOTE) SARS-CoV-2 target nucleic acids are NOT DETECTED.  The SARS-CoV-2 RNA is generally detectable in upper respiratory specimens during the acute phase of infection. The  lowest concentration of SARS-CoV-2 viral copies this assay can detect is 138 copies/mL. A negative result does not preclude SARS-Cov-2 infection and should not be used as the sole basis for treatment or other patient management decisions. A negative result may occur with  improper specimen collection/handling, submission of specimen other than nasopharyngeal swab, presence of viral mutation(s) within the areas targeted by this assay, and inadequate number of viral copies(<138 copies/mL). A negative result must be combined with clinical observations, patient history, and epidemiological information. The expected result is Negative.  Fact Sheet for Patients:  EntrepreneurPulse.com.au  Fact Sheet for Healthcare Providers:  IncredibleEmployment.be  This test is no t yet approved or cleared by the [place] FDA and  has been authorized for detection and/or diagnosis of SARS-CoV-2 by FDA under an Emergency Use Authorization (EUA). This EUA will remain  in effect (meaning this test can be used) for the duration of the COVID-19 declaration under Section 564(b)(1) of the Act, 21 U.S.C.section 360bbb-3(b)(1), unless the authorization is terminated  or revoked sooner.       Influenza A  by PCR NEGATIVE NEGATIVE Final   Influenza B by PCR NEGATIVE NEGATIVE Final    Comment: (NOTE) The Xpert Xpress SARS-CoV-2/FLU/RSV plus assay is intended as an aid in the diagnosis of influenza from Nasopharyngeal swab specimens and should not be used as a sole basis for treatment. Nasal washings and aspirates are unacceptable for Xpert Xpress SARS-CoV-2/FLU/RSV testing.  Fact Sheet for Patients: EntrepreneurPulse.com.au  Fact Sheet for Healthcare Providers: IncredibleEmployment.be  This test is not yet approved or cleared by the [place] FDA and has been authorized for detection and/or diagnosis of SARS-CoV-2 by FDA under  an Emergency Use Authorization (EUA). This EUA will remain in effect (meaning this test can be used) for the duration of the COVID-19 declaration under Section 564(b)(1) of the Act, 21 U.S.C. section 360bbb-3(b)(1), unless the authorization is terminated or revoked.  Performed at [facility], [place] [address] [place], [place] [address]          Radiology Studies: No results found.      Scheduled Meds:  atorvastatin  10 mg Oral QHS   diltiazem  30 mg Oral TID   feeding supplement  237 mL Oral BID BM   levothyroxine  100 mcg Oral Daily   metoprolol tartrate  100 mg Oral BID   mirabegron ER  50 mg Oral QHS   multivitamin with minerals  1 tablet Oral Daily   ofloxacin  1 drop Both Eyes QID   vitamin B-12  1,000 mcg Oral Daily   Continuous Infusions:  heparin 650 Units/hr (11/28/20 1231)   piperacillin-tazobactam (ZOSYN)  IV 2.25 g (11/29/20 0555)     LOS: 3 days    Time spent: 39 minutes spent on chart review, discussion with nursing staff, consultants, updating family and interview/physical exam; more than 50% of that time was spent in counseling and/or coordination of care.    Julizza Sassone J [place] DO [facility] Available via Epic secure chat 7am-7pm After these hours, please refer to coverage provider listed on [email or web address] 11/29/2020, 12:24 PM

## 2020-11-29 NOTE — Progress Notes (Addendum)
Pharmacy Antibiotic Note  [name] is a 87 y.o. female admitted on 11/26/2020 with suspected cholecystocolonic fistula.  Pharmacy has been consulted for Zosyn dosing.  Plan: Continue Zosyn 2.25g IV Q8h Follow up renal function, culture results, and clinical course.   Height: '5\' 5"'$  (165.1 cm) Weight: 53 kg (116 lb 13.5 oz) IBW/kg (Calculated) : 57  Temp (24hrs), Avg:98.1 F (36.7 C), Min:97.6 F (36.4 C), Max:98.5 F (36.9 C)  Recent Labs  Lab 11/26/20 1223 11/26/20 1708 11/27/20 0418 11/28/20 0534 11/29/20 0501  WBC 16.9*  --  13.6* 7.5 7.3  CREATININE 1.47*  --  1.54* 1.41* 1.45*  LATICACIDVEN  --  1.5  --   --   --     Estimated Creatinine Clearance: 18.1 mL/min (A) (by C-G formula based on SCr of 1.45 mg/dL (H)).    No Known Allergies  Antimicrobials this admission: 8/18 Zosyn>>   Thank you for allowing pharmacy to be a part of this patient's care.  Gretta Arab PharmD, BCPS Clinical Pharmacist [facility] main pharmacy [phone] 11/29/2020 8:15 AM

## 2020-11-29 NOTE — Progress Notes (Signed)
[place] for heparin  Indication: atrial fibrillation - bridge therapy while apixaban on hold  No Known Allergies  Patient Measurements: Height: '5\' 5"'$  (165.1 cm) Weight: 53 kg (116 lb 13.5 oz) IBW/kg (Calculated) : 57 Heparin Dosing Weight: 49.9 kg   Vital Signs: Temp: 97.6 F (36.4 C) (08/21 0623) Temp Source: Oral (08/21 0623) BP: 116/71 (08/21 0623) Pulse Rate: 71 (08/21 0623)  Labs: Recent Labs    11/26/20 1223 11/26/20 1437 11/26/20 1800 11/27/20 0418 11/27/20 1150 11/28/20 0534 11/28/20 1533 11/29/20 0501  HGB 14.7  --   --  14.7  --  14.4  --  14.5  HCT 44.8  --   --  44.9  --  44.6  --  45.2  PLT 165  --   --  178  --  182  --  197  APTT  --   --    < > 82*   < > 48* 66* 72*  LABPROT  --   --   --  16.0*  --   --   --   --   INR  --   --   --  1.3*  --   --   --   --   HEPARINUNFRC  --   --    < > >1.10*  --  >1.10*  --  >1.10*  CREATININE 1.47*  --   --  1.54*  --  1.41*  --  1.45*  TROPONINIHS 9 8  --   --   --   --   --   --    < > = values in this interval not displayed.     Estimated Creatinine Clearance: 18.1 mL/min (A) (by C-G formula based on SCr of 1.45 mg/dL (H)).   Medical History: Past Medical History:  Diagnosis Date   CAD (coronary artery disease)    a. 07/2007 PCI/Promus DES to LAD;  b. 02/2010 PCI/Promus DES to dRCA.;  c. 12/2010 Lexi MV: EF 88%, no isch/infarct.;  d. 02/2012 Cath: LM nl, LAD 20p, patent mid stent, 60d, D1 sm 70ost, D2 sm, diff, LCX sm , OM1 diff 5m(no change), RCA 40ost, patent stent, PD/PL nl, EF 75% w/o rwma's.   Carotid bruit    Diverticulosis    GERD (gastroesophageal reflux disease)    Hemorrhoids    History of echocardiogram    a. 06/2012 Echo: EF nl, mild LVH.   HLD (hyperlipidemia)    HTN (hypertension)    Hypothyroidism    IBS (irritable bowel syndrome)     Medications:  Apixaban 2.5 mg po BID, last dose 8/18 at 08 am  Assessment: 87yo F on apixaban 2.5 mg po  BID PTA for Afib.  Pharmacy consulted to dose heparin for bridge therapy while apixaban on hold during work up of possible fistula between gallbladder and colon.  Apixa 2.5 po bid last dose 8/18 at 08 am CBC WNL, SCr 1.47  11/29/2020: aPTT 72 sec - remains therapeutic on heparin 650 units/hr  Heparin level >1.1 as anticipated due to recent apixaban CBC WNL No bleeding issues reported  Goal of Therapy:  Heparin level 0.3-0.7 units/ml aPTT 66 - 102 seconds Monitor platelets by anticoagulation protocol: Yes   Plan:  Continue heparin infusion 650 units/hr CBC, aPTT & heparin level daily Will adjust heparin using aPTT until apixaban has cleared.  CGretta ArabPharmD, BCPS Clinical Pharmacist [facility] main pharmacy [phone]/21/2022 8:09 AM

## 2020-11-30 DIAGNOSIS — Z515 Encounter for palliative care: Secondary | ICD-10-CM

## 2020-11-30 DIAGNOSIS — K819 Cholecystitis, unspecified: Secondary | ICD-10-CM | POA: Diagnosis present

## 2020-11-30 DIAGNOSIS — Z7189 Other specified counseling: Secondary | ICD-10-CM

## 2020-11-30 LAB — CBC
HCT: 45.5 % (ref 36.0–46.0)
Hemoglobin: 14.6 g/dL (ref 12.0–15.0)
MCH: 31.8 pg (ref 26.0–34.0)
MCHC: 32.1 g/dL (ref 30.0–36.0)
MCV: 99.1 fL (ref 80.0–100.0)
Platelets: 188 10*3/uL (ref 150–400)
RBC: 4.59 MIL/uL (ref 3.87–5.11)
RDW: 13.2 % (ref 11.5–15.5)
WBC: 6.8 10*3/uL (ref 4.0–10.5)
nRBC: 0 % (ref 0.0–0.2)

## 2020-11-30 LAB — BASIC METABOLIC PANEL
Anion gap: 6 (ref 5–15)
BUN: 20 mg/dL (ref 8–23)
CO2: 28 mmol/L (ref 22–32)
Calcium: 9 mg/dL (ref 8.9–10.3)
Chloride: 103 mmol/L (ref 98–111)
Creatinine, Ser: 1.26 mg/dL — ABNORMAL HIGH (ref 0.44–1.00)
GFR, Estimated: 39 mL/min — ABNORMAL LOW (ref >60)
Glucose, Bld: 91 mg/dL (ref 70–99)
Potassium: 4.3 mmol/L (ref 3.5–5.1)
Sodium: 137 mmol/L (ref 135–145)

## 2020-11-30 LAB — APTT: aPTT: 74 seconds — ABNORMAL HIGH (ref 24–36)

## 2020-11-30 LAB — HEPARIN LEVEL (UNFRACTIONATED): Heparin Unfractionated: 0.81 IU/mL — ABNORMAL HIGH (ref 0.30–0.70)

## 2020-11-30 NOTE — Progress Notes (Signed)
Daily Progress Note   Patient Name: [name]       Date: 11/30/2020 DOB: Jul 23, 1920  Age: 87 y.o. MRN#: [id number] Attending Physician: [place] Eric J, DO Primary Care Physician: Holland Commons, FNP Admit Date: 11/26/2020  Reason for Consultation/Follow-up: Establishing goals of care  Subjective: Patient is awake alert resting in bed.  She complains of feeling weak. She complains of abdominal discomfort off and on. She asks for more information regarding next steps, HIDA scan, consideration for percutaneous cholecystotomy, broad goals of care discussions done.   Length of Stay: 4  Current Medications: Scheduled Meds:   atorvastatin  10 mg Oral QHS   diltiazem  30 mg Oral TID   feeding supplement  237 mL Oral BID BM   levothyroxine  100 mcg Oral Daily   metoprolol tartrate  100 mg Oral BID   mirabegron ER  50 mg Oral QHS   multivitamin with minerals  1 tablet Oral Daily   ofloxacin  1 drop Both Eyes QID   vitamin B-12  1,000 mcg Oral Daily    Continuous Infusions:  heparin 650 Units/hr (11/28/20 1231)   piperacillin-tazobactam (ZOSYN)  IV 2.25 g (11/30/20 1321)    PRN Meds: acetaminophen **OR** acetaminophen, HYDROcodone-acetaminophen, ondansetron **OR** ondansetron (ZOFRAN) IV, polyvinyl alcohol  Physical Exam         Thin appearing weak elderly lady resting in bed Appears chronically ill No acute distress Regular work of breathing S1-S2 Abdomen has mild generalized tenderness Awake alert oriented no focal deficits Possible senile purpuric spots on her skin both UE   Vital Signs: BP (!) 126/91 (BP Location: Left Arm)   Pulse 68   Temp 97.7 F (36.5 C) (Oral)   Resp 15   Ht '5\' 5"'$  (1.651 m)   Wt 53 kg   SpO2 97%   BMI 19.44 kg/m  SpO2: SpO2: 97 % O2  Device: O2 Device: Room Air O2 Flow Rate: O2 Flow Rate (L/min): 0 L/min  Intake/output summary:  Intake/Output Summary (Last 24 hours) at 11/30/2020 1338 Last data filed at 11/30/2020 0838 Gross per 24 hour  Intake 117 ml  Output 250 ml  Net -133 ml    LBM:   Baseline Weight: Weight: 49.9 kg Most recent weight: Weight: 53 kg       Palliative Assessment/Data:  Patient Active Problem List   Diagnosis Date Noted   Superior mesenteric artery stenosis ([place]) 11/29/2020   PAD (peripheral artery disease) ([place]) 11/29/2020   Cholecystocolonic fistula 11/29/2020   Abdominal infection ([place]) 11/27/2020   Abdominal pain 11/26/2020   Acute acalculous cholecystitis 11/26/2020   Protein-calorie malnutrition, severe 08/11/2020   Near syncope 08/05/2020   Rapid atrial fibrillation ([place]) 05/26/2020   Acute pulmonary embolism ([place]) 02/10/2020   Chronic diastolic CHF (congestive heart failure) ([place]) 02/10/2020   New onset atrial fibrillation ([place]) 02/10/2020   Lung nodule 02/10/2020   History of echocardiogram    GERD (gastroesophageal reflux disease)    Diverticulosis    Carotid bruit    IBS (irritable bowel syndrome)    Hypothyroidism    HTN (hypertension)    HLD (hyperlipidemia)    CAD (coronary artery disease)    Atypical chest pain 06/14/2012   Chest pain 02/12/2012   Fatigue 04/22/2011   CAROTID BRUIT 03/19/2009   HEMORRHOIDS 08/29/2008   GERD 08/29/2008   DIVERTICULOSIS, COLON 08/29/2008   HYPERLIPIDEMIA 07/04/2008   HYPERTENSION, BENIGN 07/04/2008   CAD, NATIVE VESSEL 07/04/2008   IRRITABLE BOWEL SYNDROME 07/04/2008    Palliative Care Assessment & Plan   Patient Profile:    Assessment:  87 year old lady, known to palliative medicine service seen in a previous hospitalization, has advance care planning documents with a MOST form in [place], past medical history of paroxysmal atrial fibrillation for which she is on anticoagulation with Eliquis, chronic kidney disease  stage IIIa, chronic diastolic congestive heart failure and coronary artery disease.  Patient resides at [facility] assisted living facility and was brought in for near syncopal episode.  Patient was found to have a acalculous cholecystitis and a possible fistula on abdominal CT scan.  General surgery was consulted patient was placed on IV antibiotics and admitted to hospital medicine service.  Palliative consultation for additional support and ongoing goals of care discussions has been requested.  Recommendations/Plan: Continue current mode of care as per general surgery and hospital medicine services.   I discussed with the patient as well as call was  placed and discussed with patient's daughter in law [name]: Patient has been in [facility] since the past year or so, prior to that she was being taken care of at home with her son/daughter in law. Patient is able to eat and dress herself at baseline. Daughter in law states that patient is lucid and oriented most of the time and is able to make her own decisions and carry on conversations.   We talked about the patient's current condition and ongoing hospitalization. Goals wishes and values important to the patient and family as a unit attempted to be explored. To the best of my ability, HIDA scan and percutaneous cholecystotomy was explained. We talked about waiting for the HIDA scan results and PMT follow up on 12-01-20. If the patient would elect to proceed with percutaneous cholecystotomy if indicated, she would continue treatment, would likely benefit from PT evaluation and would  likely have to consider a short term rehab facility attempt. If she would elect to not undergo percutaneous cholecystotomy, then PMT would recommend comfort-focused approach that could involve addition of hospice services on discharge. PMT to follow.      Code Status:    Code Status Orders  (From admission, onward)           Start     Ordered   11/26/20 1704  Do not  attempt resuscitation (DNR)  Continuous  Question Answer Comment  In the event of cardiac or respiratory ARREST Do not call a "code blue"   In the event of cardiac or respiratory ARREST Do not perform Intubation, CPR, defibrillation or ACLS   In the event of cardiac or respiratory ARREST Use medication by any route, position, wound care, and other measures to relive pain and suffering. May use oxygen, suction and manual treatment of airway obstruction as needed for comfort.   Comments DNR confirmed w/ son      11/26/20 1703           Code Status History     Date Active Date Inactive Code Status Order ID Comments User Context   08/05/2020 0317 08/12/2020 0208 DNR [id number]  Rise Patience, MD ED   05/26/2020 2300 06/01/2020 2233 DNR [id number]  Elwyn Reach, MD Inpatient   02/10/2020 2308 02/18/2020 2157 DNR [id number]  Vianne Bulls, MD Inpatient       Prognosis:  Guarded   Discharge Planning: To be determined.   Care plan was discussed with patient, along with bedside RN and also call placed and discussed with daughter in law [name] on the phone.    Thank you for allowing the Palliative Medicine Team to assist in the care of this patient.   Time In: 1300 Time Out: 1335 Total Time 35 Prolonged Time Billed  no       Greater than 50%  of this time was spent counseling and coordinating care related to the above assessment and plan.  Loistine Chance, MD  Please contact Palliative Medicine Team phone at [phone] for questions and concerns.

## 2020-11-30 NOTE — Progress Notes (Signed)
[facility] for heparin  Indication: atrial fibrillation - bridge therapy while apixaban on hold  No Known Allergies  Patient Measurements: Height: '5\' 5"'$  (165.1 cm) Weight: 53 kg (116 lb 13.5 oz) IBW/kg (Calculated) : 57 Heparin Dosing Weight: 49.9 kg   Vital Signs: Temp: 97.7 F (36.5 C) (08/21 2215) Temp Source: Oral (08/21 2215) BP: 126/91 (08/22 0734) Pulse Rate: 68 (08/22 0734)  Labs: Recent Labs    11/28/20 0534 11/28/20 1533 11/29/20 0501 11/30/20 0503  HGB 14.4  --  14.5 14.6  HCT 44.6  --  45.2 45.5  PLT 182  --  197 188  APTT 48* 66* 72* 74*  HEPARINUNFRC >1.10*  --  >1.10* 0.81*  CREATININE 1.41*  --  1.45* 1.26*     Estimated Creatinine Clearance: 20.9 mL/min (A) (by C-G formula based on SCr of 1.26 mg/dL (H)).   Medical History: Past Medical History:  Diagnosis Date   CAD (coronary artery disease)    a. 07/2007 PCI/Promus DES to LAD;  b. 02/2010 PCI/Promus DES to dRCA.;  c. 12/2010 Lexi MV: EF 88%, no isch/infarct.;  d. 02/2012 Cath: LM nl, LAD 20p, patent mid stent, 60d, D1 sm 70ost, D2 sm, diff, LCX sm , OM1 diff 45m(no change), RCA 40ost, patent stent, PD/PL nl, EF 75% w/o rwma's.   Carotid bruit    Diverticulosis    GERD (gastroesophageal reflux disease)    Hemorrhoids    History of echocardiogram    a. 06/2012 Echo: EF nl, mild LVH.   HLD (hyperlipidemia)    HTN (hypertension)    Hypothyroidism    IBS (irritable bowel syndrome)     Medications:  Apixaban 2.5 mg po BID, last dose 8/18 at 08 am  Assessment: 87yo F on apixaban 2.5 mg po BID PTA for Afib.  Pharmacy consulted to dose heparin for bridge therapy while apixaban on hold during work up of possible fistula between gallbladder and colon.  Apixa 2.5 po bid last dose 8/18 at 08 am CBC WNL, SCr 1.47  11/30/2020: aPTT 74 sec - remains therapeutic on heparin 650 units/hr  Heparin level 0.81 as anticipated due to recent apixaban. Trending down, but  not yet correlating with aPTT  CBC WNL No bleeding issues reported Scr 1.26 mg/dl, trending down   Goal of Therapy:  Heparin level 0.3-0.7 units/ml aPTT 66 - 102 seconds Monitor platelets by anticoagulation protocol: Yes   Plan:  Continue heparin infusion 650 units/hr CBC, aPTT & heparin level daily Will adjust heparin using aPTT until apixaban has cleared.    NRoyetta Asal PharmD, BCPS 11/30/2020 9:59 AM

## 2020-11-30 NOTE — Progress Notes (Signed)
Progress Note     Subjective: She states she has intermittent abdominal pain and nausea daily. At time of my exam she states abdominal pain is localized to the suprapubic region and she states she also needs to urinate.    Objective: Vital signs in last 24 hours: Temp:  [97.5 F (36.4 C)-97.7 F (36.5 C)] 97.7 F (36.5 C) (08/21 2215) Pulse Rate:  [68-78] 68 (08/22 0734) Resp:  [15-20] 15 (08/22 0734) BP: (105-126)/(69-91) 126/91 (08/22 0734) SpO2:  [93 %-97 %] 97 % (08/22 0734)    Intake/Output from previous day: 08/21 0701 - 08/22 0700 In: 0  Out: 250 [Urine:250] Intake/Output this shift: No intake/output data recorded.  PE: General: pleasant, WD, female who is laying in bed in NAD HEENT: head is normocephalic, atraumatic. Mouth is pink and moist Heart: regular, rate, and rhythm. Palpable radial pulses bilaterally Lungs: Respiratory effort nonlabored Abd: soft, ND, +BS, mild focal TTP over suprapubic region. Well healed midline scar MSK: all 4 extremities are symmetrical with no cyanosis, clubbing, or edema. Skin: warm and dry with no masses, lesions, or rashes Psych: alert. Oriented to self and place. Intermittently confused  Lab Results:  Recent Labs    11/29/20 0501 11/30/20 0503  WBC 7.3 6.8  HGB 14.5 14.6  HCT 45.2 45.5  PLT 197 188   BMET Recent Labs    11/29/20 0501 11/30/20 0503  NA 135 137  K 4.3 4.3  CL 100 103  CO2 27 28  GLUCOSE 87 91  BUN 24* 20  CREATININE 1.45* 1.26*  CALCIUM 8.8* 9.0   PT/INR No results for input(s): LABPROT, INR in the last 72 hours. CMP     Component Value Date/Time   NA 137 11/30/2020 0503   K 4.3 11/30/2020 0503   CL 103 11/30/2020 0503   CO2 28 11/30/2020 0503   GLUCOSE 91 11/30/2020 0503   BUN 20 11/30/2020 0503   CREATININE 1.26 (H) 11/30/2020 0503   CREATININE 1.22 (H) 04/21/2016 1223   CALCIUM 9.0 11/30/2020 0503   PROT 6.9 11/27/2020 0418   ALBUMIN 3.1 (L) 11/27/2020 0418   AST 24 11/27/2020  0418   ALT 12 11/27/2020 0418   ALKPHOS 67 11/27/2020 0418   BILITOT 1.2 11/27/2020 0418   GFRNONAA 39 (L) 11/30/2020 0503   GFRAA 86 (L) 06/14/2012 0218   Lipase  No results found for: LIPASE     Studies/Results: No results found.  Anti-infectives: Anti-infectives (From admission, onward)    Start     Dose/Rate Route Frequency Ordered Stop   11/26/20 2200  piperacillin-tazobactam (ZOSYN) IVPB 2.25 g        2.25 g 100 mL/hr over 30 Minutes Intravenous Every 8 hours 11/26/20 1837     11/26/20 1530  piperacillin-tazobactam (ZOSYN) IVPB 3.375 g        3.375 g 100 mL/hr over 30 Minutes Intravenous  Once 11/26/20 1529 11/26/20 1636        Assessment/Plan  Cholecystocolonic fistula Acalculous cholecystitis - she has had intermittent abdominal pain and abnormal findings regarding gallbladder on imaging. Im not sure if her symptoms are completely attributable to her gallbladder - leukocytosis has improved with antibiotics - will get HIDA scan to evaluate for cholecystitis. If positive then could consider perc chole which she is not sure she would want  otherwise could continue nonoperative management with antibiotics - She is not a surgical candidate for cholecystectomy   Called and updated her son Shenetha Gellner at her request. At  this point he does not think she would ultimately pursue perc chole  FEN: dys1 ID: [id number] 8/18>> VTE: heparin  Dipso: await HIDA scan     LOS: 4 days    Winferd Humphrey, [facility] 11/30/2020, 7:54 AM Please see Amion for pager number during day hours 7:00am-4:30pm

## 2020-11-30 NOTE — Progress Notes (Signed)
PROGRESS NOTE    [name]  [id number] DOB: Sep 28, 1920 DOA: 11/26/2020 PCP: Holland Commons, FNP    Brief Narrative:  [name] is a 87 year old female with past medical history significant for paroxysmal atrial fibrillation on anticoagulation with Eliquis, CKD stage IIIa, chronic diastolic congestive heart failure, CAD, who presented from [facility] assisted living facility via EMS for near syncopal episode.  Patient does not recall event and staff reported patient did not fall and no loss of consciousness.  Patient has been reporting pain to the abdomen without nausea/vomiting/diarrhea.  Denies chest pain.  In the ED, her 91.1 F, HR 89, RR 16, BP 112/68, SPO2 97% on room air.  Sodium 133, potassium 4.2, chloride 96, CO2 29, BUN 33, creatinine 1.47, glucose 128.  AST 26, ALT 16, total bilirubin 0.9.  WBC 16.9, hemoglobin 14.7, platelets 165.  High sensitive troponin 9> 8.  Cova-19 PCR negative.  Influenza A/B PCR negative.  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  General surgery was consulted.  Patient was started on IV antibiotics.  TRH consulted for further evaluation and management.   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  Patient  notable with elevated WBC count of 16.9 and procalcitonin 1.89.  LFTs within normal limits. 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  No blood cultures performed in the ED or on admission. --General surgery following, appreciate assistance; not optimal surgical candidate given her advanced age and comorbidities --WBC 16.9>13.6>7.5>7.3>6.8 --Procalcitonin 1.89>0.90>0.47 --Zosyn 2.25 g IV q8h --Follow WBC daily --Supportive care -- HIDA scan: Pending; further depending on results.  May need percutaneous cholecystotomy; family and patient unsure if she would want to proceed with this but considering.  Near syncopal episode Etiology likely secondary to abdominal infection as above with likely acalculous cholecystitis.  No fall or loss of consciousness.  TTE 02/11/2020 with LVEF 60 to 65%, no LV R WMA, mild LVH, no aortic valve stenosis.  Carotid duplex ultrasound 08/06/2020 with no significant flow restrictions bilateral carotid arteries. --Continue treatment as above --Continue to monitor on telemetry  AKI on CKD stage IIIa Baseline creatinine 1.05.  Creatinine on admission 1.47.  Suspect prerenal azotemia from abdominal infection as above.  Renal ultrasound, limited exam but grossly unremarkable kidneys. --Cr 1.47>1.54>1.41>1.45>1.26 --Avoid nephrotoxins, renally dose all medications --Repeat BMP in a.m. --Continue to monitor bladder scans as needed  Proximal SMA stenosis CT angiogram abdomen/pelvis notable for proximal SMA stenosis with patent  celiac/IMA likely providing collateral circulation to the mesentery.  Likely chronic. --Continue atorvastatin 10 mg  p.o. daily  Left SFA origin stenosis CT angiogram abdomen/pelvis notable for left SFA stenosis.  Patient not complaining of claudication or leg pain.  Hold on obtaining ABIs for now given not optimal surgical candidate.  Bacterial conjunctivitis, OU Patient reports matted eyelids, slightly blurred vision and discharge from bilateral eyes with associated subconjunctival erythema. --Ocuflox eyedrops 4 times daily x7 days  Paroxysmal atrial fibrillation EKG on admission with atrial fibrillation, rate 61; no concerning ischemic findings.  High sensitive troponin within normal limits x2.  Chest x-ray with no acute cardiopulmonary disease process. --Holding Eliquis, on heparin drip --Decreased diltiazem to 30 mg p.o. 3 times daily given borderline low heart rate (hold for SBP <100 or HR<60)  HLD: Atorvastatin 10 mg p.o. daily  Hypothyroidism: Levothyroxine 100 mcg p.o. daily  Weakness/deconditioning/debility: Patient currently resides at [facility].  She has become deconditioned due to her illness as above.  Unsure if she will be able to return back to her current assisted living facility given her her debility. --PT/OT evaluation   DVT prophylaxis: Heparin drip   Code Status: DNR Family Communication: No family present at bedside this morning, updated patient's daughter-in-law, Debroah Baller via telephone this morning.  Disposition Plan:  Level of care: Telemetry Status is: Inpatient  Remains inpatient appropriate because:Ongoing active pain requiring inpatient pain management, Ongoing diagnostic testing needed not appropriate for outpatient work up, Unsafe d/c plan, IV treatments appropriate due to intensity of illness or inability to take PO, and Inpatient level of care appropriate due to severity of illness  Dispo: The patient is from: ALF              Anticipated  d/c is to: ALF              Patient currently is not medically stable to d/c.   Difficult to place patient No  Consultants:  General surgery  Procedures:  None  Antimicrobials:  Zosyn 8/19 Ocuflox eyedrops 8/21>>   Subjective: Patient seen examined bedside, resting comfortably.  Lying in bed.  Continues with generalized abdominal pain, states better right upper quadrant now complaining of suprapubic pain.  Requesting to go to the bathroom.  No family present at bedside this morning, updated patient's daughter-in-law Joseph Art via telephone this morning.  Denies headache, no visual changes, no chest pain, no palpitations, no shortness of breath, no cough/congestion, no fever/chills/night sweats, no nausea/vomiting/diarrhea.  No acute events overnight per nursing staff.  Objective: Vitals:   11/29/20 0700 11/29/20 1432 11/29/20 2215 11/30/20 0734  BP:  105/73 109/69 (!) 126/91  Pulse:  68 78 68  Resp:  '20 15 15  '$ Temp:  (!) 97.5 F (36.4 C) 97.7 F (36.5 C)   TempSrc:  Oral Oral   SpO2:  94% 93% 97%  Weight: 53 kg     Height:        Intake/Output Summary (Last 24 hours) at 11/30/2020 1221 Last data filed at 11/30/2020 [id number] Gross per 24 hour  Intake 117 ml  Output 250 ml  Net -133 ml   Filed Weights   11/26/20 1158 11/28/20 0500 11/29/20 0700  Weight: 49.9 kg 50.1 kg 53 kg    Examination:  General exam: Appears calm and comfortable, elderly and chronically ill in appearance Respiratory system: Clear to auscultation. Respiratory effort normal.  On room air Cardiovascular system: S1 & S2 heard, RRR. No JVD, murmurs, rubs, gallops or clicks. No pedal edema. Gastrointestinal system: Abdomen is nondistended, soft, mild tenderness suprapubic region. No organomegaly or  masses felt. Normal bowel sounds heard. Central nervous system: Alert and oriented. No focal neurological deficits. Extremities: Symmetric 5 x 5 power. Skin: Multiple areas of ecchymosis in various stages of healing  to extremities Psychiatry: Judgement and insight appear normal. Mood & affect appropriate.     Data Reviewed: I have personally reviewed following labs and imaging studies  CBC: Recent Labs  Lab 11/26/20 1223 11/27/20 0418 11/28/20 0534 11/29/20 0501 11/30/20 0503  WBC 16.9* 13.6* 7.5 7.3 6.8  NEUTROABS 14.0*  --   --   --   --   HGB 14.7 14.7 14.4 14.5 14.6  HCT 44.8 44.9 44.6 45.2 45.5  MCV 98.2 98.0 98.0 98.7 99.1  PLT 165 178 182 197 0000000   Basic Metabolic Panel: Recent Labs  Lab 11/26/20 1223 11/27/20 0418 11/28/20 0534 11/29/20 0501 11/30/20 0503  NA 133* 135 134* 135 137  K 4.2 4.6 4.1 4.3 4.3  CL 96* 98 97* 100 103  CO2 '29 29 26 27 28  '$ GLUCOSE 128* 94 92 87 91  BUN 33* 32* 28* 24* 20  CREATININE 1.47* 1.54* 1.41* 1.45* 1.26*  CALCIUM 9.0 8.9 8.7* 8.8* 9.0  MG  --   --  2.3  --   --    GFR: Estimated Creatinine Clearance: 20.9 mL/min (A) (by C-G formula based on SCr of 1.26 mg/dL (H)). Liver Function Tests: Recent Labs  Lab 11/26/20 1223 11/27/20 0418  AST 26 24  ALT 16 12  ALKPHOS 71 67  BILITOT 0.9 1.2  PROT 7.3 6.9  ALBUMIN 3.4* 3.1*   No results for input(s): LIPASE, AMYLASE in the last 168 hours. No results for input(s): AMMONIA in the last 168 hours. Coagulation Profile: Recent Labs  Lab 11/27/20 0418  INR 1.3*   Cardiac Enzymes: No results for input(s): CKTOTAL, CKMB, CKMBINDEX, TROPONINI in the last 168 hours. BNP (last 3 results) No results for input(s): PROBNP in the last 8760 hours. HbA1C: No results for input(s): HGBA1C in the last 72 hours. CBG: Recent Labs  Lab 11/26/20 1159  GLUCAP 139*   Lipid Profile: No results for input(s): CHOL, HDL, LDLCALC, TRIG, CHOLHDL, LDLDIRECT in the last 72 hours. Thyroid Function Tests: No results for input(s): TSH, T4TOTAL, FREET4, T3FREE, THYROIDAB in the last 72 hours. Anemia Panel: No results for input(s): VITAMINB12, FOLATE, FERRITIN, TIBC, IRON, RETICCTPCT in the last 72  hours. Sepsis Labs: Recent Labs  Lab 11/26/20 1708 11/27/20 0418 11/28/20 0534 11/29/20 0501  PROCALCITON  --  1.89 0.90 0.47  LATICACIDVEN 1.5  --   --   --     Recent Results (from the past 240 hour(s))  Resp Panel by RT-PCR (Flu A&B, Covid) Nasopharyngeal Swab     Status: None   Collection Time: 11/26/20  4:40 PM   Specimen: Nasopharyngeal Swab; Nasopharyngeal(NP) swabs in vial transport medium  Result Value Ref Range Status   SARS Coronavirus 2 by RT PCR NEGATIVE NEGATIVE Final    Comment: (NOTE) SARS-CoV-2 target nucleic acids are NOT DETECTED.  The SARS-CoV-2 RNA is generally detectable in upper respiratory specimens during the acute phase of infection. The lowest concentration of SARS-CoV-2 viral copies this assay can detect is 138 copies/mL. A negative result does not preclude SARS-Cov-2 infection and should not be used as the sole basis for treatment or other patient management decisions. A negative result may occur with  improper specimen collection/handling, submission of specimen other than nasopharyngeal swab, presence of viral mutation(s) within the areas targeted by this  assay, and inadequate number of viral copies(<138 copies/mL). A negative result must be combined with clinical observations, patient history, and epidemiological information. The expected result is Negative.  Fact Sheet for Patients:  EntrepreneurPulse.com.au  Fact Sheet for Healthcare Providers:  IncredibleEmployment.be  This test is no t yet approved or cleared by the [place] FDA and  has been authorized for detection and/or diagnosis of SARS-CoV-2 by FDA under an Emergency Use Authorization (EUA). This EUA will remain  in effect (meaning this test can be used) for the duration of the COVID-19 declaration under Section 564(b)(1) of the Act, 21 U.S.C.section 360bbb-3(b)(1), unless the authorization is terminated  or revoked sooner.        Influenza A by PCR NEGATIVE NEGATIVE Final   Influenza B by PCR NEGATIVE NEGATIVE Final    Comment: (NOTE) The Xpert Xpress SARS-CoV-2/FLU/RSV plus assay is intended as an aid in the diagnosis of influenza from Nasopharyngeal swab specimens and should not be used as a sole basis for treatment. Nasal washings and aspirates are unacceptable for Xpert Xpress SARS-CoV-2/FLU/RSV testing.  Fact Sheet for Patients: EntrepreneurPulse.com.au  Fact Sheet for Healthcare Providers: IncredibleEmployment.be  This test is not yet approved or cleared by the [place] FDA and has been authorized for detection and/or diagnosis of SARS-CoV-2 by FDA under an Emergency Use Authorization (EUA). This EUA will remain in effect (meaning this test can be used) for the duration of the COVID-19 declaration under Section 564(b)(1) of the Act, 21 U.S.C. section 360bbb-3(b)(1), unless the authorization is terminated or revoked.  Performed at [facility], [place] [address] [place], [place] [address]          Radiology Studies: No results found.      Scheduled Meds:  atorvastatin  10 mg Oral QHS   diltiazem  30 mg Oral TID   feeding supplement  237 mL Oral BID BM   levothyroxine  100 mcg Oral Daily   metoprolol tartrate  100 mg Oral BID   mirabegron ER  50 mg Oral QHS   multivitamin with minerals  1 tablet Oral Daily   ofloxacin  1 drop Both Eyes QID   vitamin B-12  1,000 mcg Oral Daily   Continuous Infusions:  heparin 650 Units/hr (11/28/20 1231)   piperacillin-tazobactam (ZOSYN)  IV 2.25 g (11/30/20 0537)     LOS: 4 days    Time spent: 39 minutes spent on chart review, discussion with nursing staff, consultants, updating family and interview/physical exam; more than 50% of that time was spent in counseling and/or coordination of care.    Alias Villagran J [place] DO [facility] Available via Epic secure chat 7am-7pm After these  hours, please refer to coverage provider listed on [email or web address] 11/30/2020, 12:21 PM

## 2020-12-01 ENCOUNTER — Inpatient Hospital Stay (HOSPITAL_COMMUNITY): Payer: PPO

## 2020-12-01 LAB — CBC
HCT: 44.5 % (ref 36.0–46.0)
Hemoglobin: 14.1 g/dL (ref 12.0–15.0)
MCH: 31.8 pg (ref 26.0–34.0)
MCHC: 31.7 g/dL (ref 30.0–36.0)
MCV: 100.5 fL — ABNORMAL HIGH (ref 80.0–100.0)
Platelets: 204 10*3/uL (ref 150–400)
RBC: 4.43 MIL/uL (ref 3.87–5.11)
RDW: 13.2 % (ref 11.5–15.5)
WBC: 8 10*3/uL (ref 4.0–10.5)
nRBC: 0 % (ref 0.0–0.2)

## 2020-12-01 LAB — BASIC METABOLIC PANEL
Anion gap: 7 (ref 5–15)
BUN: 27 mg/dL — ABNORMAL HIGH (ref 8–23)
CO2: 28 mmol/L (ref 22–32)
Calcium: 9.1 mg/dL (ref 8.9–10.3)
Chloride: 102 mmol/L (ref 98–111)
Creatinine, Ser: 1.33 mg/dL — ABNORMAL HIGH (ref 0.44–1.00)
GFR, Estimated: 36 mL/min — ABNORMAL LOW (ref >60)
Glucose, Bld: 95 mg/dL (ref 70–99)
Potassium: 4.6 mmol/L (ref 3.5–5.1)
Sodium: 137 mmol/L (ref 135–145)

## 2020-12-01 LAB — HEPARIN LEVEL (UNFRACTIONATED): Heparin Unfractionated: 0.32 IU/mL (ref 0.30–0.70)

## 2020-12-01 LAB — APTT: aPTT: 45 seconds — ABNORMAL HIGH (ref 24–36)

## 2020-12-01 MED ORDER — AMOXICILLIN-POT CLAVULANATE 875-125 MG PO TABS: 1.0000 | ORAL_TABLET | Freq: Two times a day (BID) | ORAL | 0 refills | Status: DC

## 2020-12-01 MED ORDER — APIXABAN 2.5 MG PO TABS
2.5000 mg | ORAL_TABLET | Freq: Two times a day (BID) | ORAL | Status: DC
  Administered 2020-12-01 – 2020-12-03 (×4): 2.5 mg via ORAL
  Filled 2020-12-01 (×4): qty 1

## 2020-12-01 MED ORDER — DILTIAZEM HCL 30 MG PO TABS: 30.0000 mg | ORAL_TABLET | Freq: Three times a day (TID) | ORAL | Status: AC

## 2020-12-01 MED ORDER — AMOXICILLIN-POT CLAVULANATE 500-125 MG PO TABS
1.0000 | ORAL_TABLET | Freq: Two times a day (BID) | ORAL | Status: DC
  Administered 2020-12-01 – 2020-12-03 (×4): 500 mg via ORAL
  Filled 2020-12-01 (×5): qty 1

## 2020-12-01 MED ORDER — AMOXICILLIN-POT CLAVULANATE 500-125 MG PO TABS: 1.0000 | ORAL_TABLET | Freq: Two times a day (BID) | ORAL | 0 refills | Status: AC

## 2020-12-01 MED ORDER — HEPARIN (PORCINE) 25000 UT/250ML-% IV SOLN
700.0000 [IU]/h | INTRAVENOUS | Status: DC
  Filled 2020-12-01: qty 250

## 2020-12-01 MED ORDER — MORPHINE SULFATE (PF) 2 MG/ML IV SOLN
2.0000 mg | Freq: Once | INTRAVENOUS | Status: AC
  Administered 2020-12-01: 2 mg via INTRAVENOUS
  Filled 2020-12-01: qty 1

## 2020-12-01 MED ORDER — TECHNETIUM TC 99M MEBROFENIN IV KIT
5.4000 | PACK | Freq: Once | INTRAVENOUS | Status: AC
  Administered 2020-12-01: 5.4 via INTRAVENOUS

## 2020-12-01 MED ORDER — OFLOXACIN 0.3 % OP SOLN: 1.0000 [drp] | Freq: Four times a day (QID) | OPHTHALMIC | 0 refills | Status: AC

## 2020-12-01 MED ORDER — OFLOXACIN 0.3 % OP SOLN: 1.0000 [drp] | Freq: Four times a day (QID) | OPHTHALMIC | 0 refills | Status: DC

## 2020-12-01 NOTE — Evaluation (Signed)
Occupational Therapy Evaluation Patient Details Name: [name] MRN: [id number] DOB: [birth year] Today's Date: 12/01/2020    History of Present Illness Patient is a 87 year old female admitted with acute cholecystitis. Hx of falls, dizziness, syncope, Afib, CAD, CHF, CKD   Clinical Impression   Patient is a 87 year old female who was admitted for above. Patient was noted to be very quick to fatigue with grooming tasks on this date. Patients evaluation was limited due to fatigue.Patient continued to report I am weak during session. Patient was noted to have decreased activity tolerance, decreased endurance, increased shortness of breath and increased need for assistance impacting patients ability to engage in ADL tasks. Patient would continue to benefit from skilled OT services at this time while admitted and after d/c to address noted deficits in order to improve overall safety and independence in ADLs.      Follow Up Recommendations  SNF    Equipment Recommendations  None recommended by OT    Recommendations for Other Services       Precautions / Restrictions Precautions Precautions: Fall Precaution Comments: incontinent Restrictions Weight Bearing Restrictions: No      Mobility Bed Mobility Overal bed mobility: Needs Assistance Bed Mobility: Supine to Sit     Supine to sit: Supervision     General bed mobility comments: Supv for safety.    Transfers Overall transfer level: Needs assistance Equipment used: Rolling walker (2 wheeled) Transfers: Sit to/from [facility] Sit to Stand: Min assist Stand pivot transfers: Min assist       General transfer comment: Assist to rise, steady, control descent. 1 instance of bil LE buckling. Stand pivot, bed to bsc-pt incontinent of urine    Balance Overall balance assessment: History of Falls;Needs assistance         Standing balance support: Bilateral upper extremity supported Standing balance-Leahy  Scale: Poor                             ADL either performed or assessed with clinical judgement   ADL Overall ADL's : Needs assistance/impaired Eating/Feeding: Minimal assistance;Sitting   Grooming: Moderate assistance;Sitting Grooming Details (indicate cue type and reason): patient was able to comb hair with extended rest breaks. patient set up tooth brush for brushing teeth then reported i give out and declined to complete task. nurse made aware. Upper Body Bathing: Maximal assistance;Bed level   Lower Body Bathing: Maximal assistance;Bed level   Upper Body Dressing : Moderate assistance;Bed level   Lower Body Dressing: Maximal assistance;Bed level   Toilet Transfer: Maximal assistance;+2 for physical assistance;+2 for safety/equipment Toilet Transfer Details (indicate cue type and reason): unable to assess on this date. Toileting- Clothing Manipulation and Hygiene: Maximal assistance;+2 for safety/equipment;+2 for physical assistance               Vision Patient Visual Report: No change from baseline       Perception     Praxis      Pertinent Vitals/Pain Pain Assessment: No/denies pain Faces Pain Scale: Hurts even more Pain Location: headache Pain Intervention(s): Repositioned;Patient requesting pain meds-RN notified     Hand Dominance Right   Extremity/Trunk Assessment Upper Extremity Assessment Upper Extremity Assessment: Defer to OT evaluation   Lower Extremity Assessment Lower Extremity Assessment: Generalized weakness   Cervical / Trunk Assessment Cervical / Trunk Assessment: Kyphotic   Communication Communication Communication: No difficulties   Cognition Arousal/Alertness: Awake/alert Behavior During Therapy:  WFL for tasks assessed/performed Overall Cognitive Status: No family/caregiver present to determine baseline cognitive functioning                                 General Comments: seems mostly WFL but pt does  repeated herself-STM deficits   General Comments       Exercises     Shoulder Instructions      Home Living Family/patient expects to be discharged to:: Assisted living                             Home Equipment: Walker - 4 wheels   Additional Comments: Patient from ALF.      Prior Functioning/Environment Level of Independence: Needs assistance  Gait / Transfers Assistance Needed: Uses rollator for ambulation. Reports she is supposed to have assistance when she gets up but sometimes she gets up by herself. ADL's / Homemaking Assistance Needed: patient reported dressing by herself but "the girls" bath her and help her get to the bathroom. patient indicated she was unable to go to bathroom alone needing help for hygiene. family was not present on this date to confirm PLOF or cognitive baseline.            OT Problem List: Decreased strength;Decreased activity tolerance;Cardiopulmonary status limiting activity;Impaired balance (sitting and/or standing);Decreased safety awareness;Decreased knowledge of use of DME or AE      OT Treatment/Interventions: Self-care/ADL training;Therapeutic exercise;Neuromuscular education;DME and/or AE instruction;Therapeutic activities;Balance training;Patient/family education    OT Goals(Current goals can be found in the care plan section) Acute Rehab OT Goals Patient Stated Goal: get stronger OT Goal Formulation: With patient Time For Goal Achievement: 12/15/20 Potential to Achieve Goals: Good  OT Frequency: Min 2X/week   Barriers to D/C:            Co-evaluation              AM-PAC OT "6 Clicks" Daily Activity     Outcome Measure Help from another person eating meals?: A Little Help from another person taking care of personal grooming?: A Little Help from another person toileting, which includes using toliet, bedpan, or urinal?: A Lot Help from another person bathing (including washing, rinsing, drying)?: A Lot Help  from another person to put on and taking off regular upper body clothing?: A Lot Help from another person to put on and taking off regular lower body clothing?: A Lot 6 Click Score: 14   End of Session Nurse Communication: Other (comment) (concerns over d/c location)  Activity Tolerance: Patient limited by fatigue Patient left: in chair;with call bell/phone within reach;with chair alarm set  OT Visit Diagnosis: Unsteadiness on feet (R26.81);Muscle weakness (generalized) (M62.81)                Time: [id number] OT Time Calculation (min): 16 min Charges:  OT General Charges $OT Visit: 1 Visit OT Evaluation $OT Eval Moderate Complexity: 1 Mod  Jackelyn Poling OTR/L, MS Acute Rehabilitation Department Office# [phone] Pager# [phone]   [place] 12/01/2020, 1:06 PM

## 2020-12-01 NOTE — NC FL2 (Signed)
[place] LEVEL OF CARE SCREENING TOOL     IDENTIFICATION  Patient Name: [name] Birthdate: 12-02-1920 Sex: female Admission Date (Current Location): 11/26/2020  [facility] [place] Number:  Herbalist and Address:  [facility],  [place], [place]      Provider Number: [id number]  Attending Physician Name and Address:  [place] Donnamarie Poag, DO  Relative Name and Phone Number:       Current Level of Care: Hospital Recommended Level of Care: [place] Prior Approval Number:    Date Approved/Denied:   PASRR Number: [id number]  Discharge Plan: SNF    Current Diagnoses: Patient Active Problem List   Diagnosis Date Noted   Acalculous cholecystitis    Palliative care by specialist    Goals of care, counseling/discussion    Superior mesenteric artery stenosis ([employer]) 11/29/2020   PAD (peripheral artery disease) ([place]) 11/29/2020   Abdominal infection ([place]) 11/27/2020   Acute acalculous cholecystitis 11/26/2020   Protein-calorie malnutrition, severe 08/11/2020   Rapid atrial fibrillation ([place]) 05/26/2020   Acute pulmonary embolism ([place]) 02/10/2020   Chronic diastolic CHF (congestive heart failure) ([place]) 02/10/2020   New onset atrial fibrillation ([place]) 02/10/2020   Lung nodule 02/10/2020   History of echocardiogram    GERD (gastroesophageal reflux disease)    Diverticulosis    Carotid bruit    IBS (irritable bowel syndrome)    Hypothyroidism    HTN (hypertension)    HLD (hyperlipidemia)    CAD (coronary artery disease)    Atypical chest pain 06/14/2012   Chest pain 02/12/2012   Fatigue 04/22/2011   CAROTID BRUIT 03/19/2009   HEMORRHOIDS 08/29/2008   GERD 08/29/2008   DIVERTICULOSIS, COLON 08/29/2008   HYPERLIPIDEMIA 07/04/2008   HYPERTENSION, BENIGN 07/04/2008   CAD, NATIVE VESSEL 07/04/2008   IRRITABLE BOWEL SYNDROME 07/04/2008    Orientation RESPIRATION BLADDER Height & Weight     Self,  Time, Situation, Place  Normal Continent Weight: 51.1 kg Height:  '5\' 5"'$  (165.1 cm)  BEHAVIORAL SYMPTOMS/MOOD NEUROLOGICAL BOWEL NUTRITION STATUS      Continent Diet (Regular)  AMBULATORY STATUS COMMUNICATION OF NEEDS Skin   Limited Assist Verbally Bruising                       Personal Care Assistance Level of Assistance  Bathing, Feeding, Dressing Bathing Assistance: Limited assistance Feeding assistance: Limited assistance Dressing Assistance: Limited assistance     Functional Limitations Info  Sight, Hearing, Speech Sight Info: Adequate Hearing Info: Adequate Speech Info: Adequate    SPECIAL CARE FACTORS FREQUENCY  PT (By licensed PT), OT (By licensed OT)     PT Frequency: 5 x weekly OT Frequency: 5 x weekly            Contractures Contractures Info: Not present    Additional Factors Info  Code Status, Allergies Code Status Info: DNR Allergies Info: None known           Current Medications (12/01/2020):  This is the current hospital active medication list Current Facility-Administered Medications  Medication Dose Route Frequency Provider Last Rate Last Admin   acetaminophen (TYLENOL) tablet 650 mg  650 mg Oral Q6H PRN Marylyn Ishihara, Tyrone A, DO   650 mg at 12/01/20 1225   Or   acetaminophen (TYLENOL) suppository 650 mg  650 mg Rectal Q6H PRN Marylyn Ishihara, Tyrone A, DO       atorvastatin (LIPITOR) tablet 10 mg  10 mg Oral  QHS Marylyn Ishihara, Tyrone A, DO   10 mg at 11/30/20 2132   diltiazem (CARDIZEM) tablet 30 mg  30 mg Oral TID [place] Eric J, DO   30 mg at 12/01/20 1006   feeding supplement (ENSURE ENLIVE / ENSURE PLUS) liquid 237 mL  237 mL Oral BID BM [place] Eric J, DO   237 mL at 12/01/20 1007   heparin ADULT infusion 100 units/mL (25000 units/229m)  700 Units/hr Intravenous Continuous WEmiliano Dyer RPH 7 mL/hr at 12/01/20 0744 700 Units/hr at 12/01/20 0744   HYDROcodone-acetaminophen (NORCO/VICODIN) 5-325 MG per tablet 1-2 tablet  1-2 tablet Oral Q4H PRN KCherylann RatelA, DO   1 tablet at 11/26/20 2030   levothyroxine (SYNTHROID) tablet 100 mcg  100 mcg Oral Daily KCherylann RatelA, DO   100 mcg at 12/01/20 0555   metoprolol tartrate (LOPRESSOR) tablet 100 mg  100 mg Oral BID KMarylyn Ishihara Tyrone A, DO   100 mg at 12/01/20 1006   mirabegron ER (MYRBETRIQ) tablet 50 mg  50 mg Oral QHS Kyle, Tyrone A, DO   50 mg at 11/29/20 2142   multivitamin with minerals tablet 1 tablet  1 tablet Oral Daily [place] Eric J, DO   1 tablet at 12/01/20 1006   ofloxacin (OCUFLOX) 0.3 % ophthalmic solution 1 drop  1 drop Both Eyes QID [place] Eric J, DO   1 drop at 12/01/20 1007   ondansetron (ZOFRAN) tablet 4 mg  4 mg Oral Q6H PRN KCherylann RatelA, DO       Or   ondansetron (ZOFRAN) injection 4 mg  4 mg Intravenous Q6H PRN KMarylyn Ishihara Tyrone A, DO   4 mg at 11/29/20 1028   piperacillin-tazobactam (ZOSYN) IVPB 2.25 g  2.25 g Intravenous Q8H BEudelia Bunch RPH 100 mL/hr at 12/01/20 0545 2.25 g at 12/01/20 0545   polyvinyl alcohol (LIQUIFILM TEARS) 1.4 % ophthalmic solution 2 drop  2 drop Both Eyes PRN [place] Eric J, DO       vitamin B-12 (CYANOCOBALAMIN) tablet 1,000 mcg  1,000 mcg Oral Daily Kyle, Tyrone A, DO   1,000 mcg at 12/01/20 1007     Discharge Medications: Please see discharge summary for a list of discharge medications.  Relevant Imaging Results:  Relevant Lab Results:   Additional Information SS#[id number] Covid vaccination x2  Bassem Bernasconi, NMarjie Skiff RN

## 2020-12-01 NOTE — Evaluation (Addendum)
Physical Therapy Evaluation Patient Details Name: [name] MRN: [id number] DOB: [birth year] Today's Date: 12/01/2020   History of Present Illness  87 yo female admitted with acute cholecystitis. Hx of falls, dizziness, syncope, Afib, CAD, CHF, CKD  Clinical Impression  On eval, pt required Min A for mobility. She pivoted to bsc then took a few steps in room with RW. Pt is at high risk for falls. Two instances of LE buckling during session. Pt presents with general weakness, decreased activity tolerance, and impaired gait and balance. Pt c/o HA and head "feeling heavy"-BP elevated when I assessed it-made RN aware. At this time, recommendation is for SNF. No family present during session. Will follow and progress activity as tolerated.     Follow Up Recommendations SNF    Equipment Recommendations  None recommended by PT    Recommendations for Other Services       Precautions / Restrictions Precautions Precautions: Fall Precaution Comments: incontinent Restrictions Weight Bearing Restrictions: No      Mobility  Bed Mobility Overal bed mobility: Needs Assistance Bed Mobility: Supine to Sit     Supine to sit: Supervision     General bed mobility comments: Supv for safety.    Transfers Overall transfer level: Needs assistance Equipment used: Rolling walker (2 wheeled) Transfers: Sit to/from [facility] Sit to Stand: Min assist Stand pivot transfers: Min assist       General transfer comment: Assist to rise, steady, control descent. 1 instance of bil LE buckling. Stand pivot, bed to bsc-pt incontinent of urine  Ambulation/Gait Ambulation/Gait assistance: Min assist Gait Distance (Feet): 3 Feet Assistive device: Rolling walker (2 wheeled) Gait Pattern/deviations: Step-through pattern;Decreased stride length     General Gait Details: High fall risk. 1 instance of LE buckling. Assist to steady/support pt and safely maneuver RW. Cues for safety,  proper operation of RW required.  Stairs            Wheelchair Mobility    Modified Rankin (Stroke Patients Only)       Balance Overall balance assessment: History of Falls;Needs assistance         Standing balance support: Bilateral upper extremity supported Standing balance-Leahy Scale: Poor                               Pertinent Vitals/Pain Pain Assessment: Faces Faces Pain Scale: Hurts even more Pain Location: headache Pain Intervention(s): Repositioned;Patient requesting pain meds-RN notified    Home Living Family/patient expects to be discharged to:: Assisted living               Home Equipment: Walker - 4 wheels      Prior Function Level of Independence: Needs assistance   Gait / Transfers Assistance Needed: Uses rollator for ambulation. Reports she is supposed to have assistance when she gets up but sometimes she gets up by herself.  ADL's / Homemaking Assistance Needed: Has assistance for bathing/dressing. Reports "they" help her to the bathroom. Reports she's not supposed to go by herself.        Hand Dominance        Extremity/Trunk Assessment   Upper Extremity Assessment Upper Extremity Assessment: Defer to OT evaluation    Lower Extremity Assessment Lower Extremity Assessment: Generalized weakness    Cervical / Trunk Assessment Cervical / Trunk Assessment: Kyphotic  Communication   Communication: No difficulties  Cognition Arousal/Alertness: Awake/alert Behavior During Therapy: WFL for tasks assessed/performed Overall  Cognitive Status: No family/caregiver present to determine baseline cognitive functioning                                 General Comments: seems mostly WFL but pt does repeat herself-STM deficits. She also required some safety cues.       General Comments      Exercises     Assessment/Plan    PT Assessment Patient needs continued PT services  PT Problem List Decreased  strength;Decreased mobility;Decreased activity tolerance;Decreased balance;Decreased knowledge of use of DME;Pain;Decreased cognition       PT Treatment Interventions DME instruction;Gait training;Therapeutic exercise;Balance training;Functional mobility training;Therapeutic activities;Patient/family education    PT Goals (Current goals can be found in the Care Plan section)  Acute Rehab PT Goals Patient Stated Goal: none stated PT Goal Formulation: With patient Time For Goal Achievement: 12/15/20 Potential to Achieve Goals: Fair    Frequency Min 3X/week   Barriers to discharge        Co-evaluation               AM-PAC PT "6 Clicks" Mobility  Outcome Measure Help needed turning from your back to your side while in a flat bed without using bedrails?: A Little Help needed moving from lying on your back to sitting on the side of a flat bed without using bedrails?: A Little Help needed moving to and from a bed to a chair (including a wheelchair)?: A Little Help needed standing up from a chair using your arms (e.g., wheelchair or bedside chair)?: A Little Help needed to walk in hospital room?: A Lot Help needed climbing 3-5 steps with a railing? : Total 6 Click Score: 15    End of Session Equipment Utilized During Treatment: Gait belt Activity Tolerance: Patient limited by fatigue Patient left: in chair;with call bell/phone within reach;with chair alarm set Nurse Communication: Mobility status (elevated BP when I took it and pt c/o HA) PT Visit Diagnosis: Muscle weakness (generalized) (M62.81);Difficulty in walking, not elsewhere classified (R26.2)    Time: [id number] PT Time Calculation (min) (ACUTE ONLY): 34 min   Charges:   PT Evaluation $PT Eval Moderate Complexity: 1 Mod PT Treatments $Gait Training: 8-22 mins          Doreatha Massed, PT Acute Rehabilitation  Office: [phone] Pager: [phone]

## 2020-12-01 NOTE — Discharge Summary (Signed)
Physician Discharge Summary  [name] [id number] DOB: 05/31/1920 DOA: 11/26/2020  PCP: Holland Commons, FNP  Admit date: 11/26/2020 Discharge date: 12/01/2020  Admitted From: [facility] Disposition: [facility]  Recommendations for Outpatient Follow-up:  Follow up with PCP in 1-2 weeks Continue Augmentin 875-125 mg p.o. twice daily to complete 10-day course of antibiotics for a calculus cholecystitis Reduced Cardizem dose to 30 mg 3 times daily due to bradycardia while inpatient If has recurrence of abdominal pain from cholecystitis, may need to consider hospice care as she is a very poor operative candidate even for a percutaneous treatment with a cholecystostomy tube  Discharge Condition: Stable but overall poor prognosis given her advanced age, comorbidities CODE STATUS: DNR Diet recommendation: Regular diet  History of present illness:  [name] is a 87 year old female with past medical history significant for paroxysmal atrial fibrillation on anticoagulation with Eliquis, CKD stage IIIa, chronic diastolic congestive heart failure, CAD, who presented from [facility] assisted living facility via EMS for near syncopal episode.  Patient does not recall event and staff reported patient did not fall and no loss of consciousness.  Patient has been reporting pain to the abdomen without nausea/vomiting/diarrhea.  Denies chest pain.   In the ED, her 91.1 F, HR 89, RR 16, BP 112/68, SPO2 97% on room air.  Sodium 133, potassium 4.2, chloride 96, CO2 29, BUN 33, creatinine 1.47, glucose 128.  AST 26, ALT 16, total bilirubin 0.9.  WBC 16.9, hemoglobin 14.7, platelets 165.  High sensitive troponin 9> 8.  Cova-19 PCR negative.  Influenza A/B PCR negative.  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   General surgery was consulted.  Patient was started on IV antibiotics.  TRH consulted for further evaluation and management.  Hospital course:   Acute acalculus cholecystitis Patient presenting to ED following presyncopal episode in which patient has been complaining of abdominal pain.  Patient notable with elevated WBC count of 16.9 and procalcitonin 1.89.  LFTs within normal limits. 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  No blood cultures performed in the ED or on admission.  Patient was started on IV Zosyn.  General surgery was consulted and followed during hospital course.  HIDA scan notable for no filling within the gallbladder but patent common bile duct.  Discussed with general surgery, given her poor operative candidate and unlikely to tolerate percutaneous cholecystostomy tube placement; patient's son (POA [name]), agreement with just conservative management with antibiotics.  General surgery recommends 10-day course and will discharge on Augmentin 875-125 mg p.o. twice daily.  Patient's WBC count improved from 16.9  to 8.0 at time of discharge.  If patient has recurrence of symptoms, may need to consider transitioning care to hospice given her significant comorbidities and advanced age.   Near syncopal episode Etiology likely secondary to abdominal infection as above with likely acalculous cholecystitis.  No fall or loss of consciousness.  TTE 02/11/2020 with LVEF 60 to 65%, no LV R WMA, mild LVH, no aortic valve stenosis.  Carotid duplex  ultrasound 08/06/2020 with  no significant flow restrictions bilateral carotid arteries.   AKI on CKD stage IIIa Baseline creatinine 1.05.  Creatinine on admission 1.47.  Suspect prerenal azotemia from abdominal infection as above.  Renal ultrasound, limited exam but grossly unremarkable kidneys.  Creatinine peaked at 1.54 during hospitalization.  Patient's creatinine improved to 1.33 at time of discharge.   Proximal SMA stenosis CT angiogram abdomen/pelvis notable for proximal SMA stenosis with patent celiac/IMA likely providing collateral circulation to the mesentery.  Likely chronic.  Continue statin   Left SFA origin stenosis CT angiogram abdomen/pelvis notable for left SFA stenosis.  Patient not complaining of claudication or leg pain.  Hold on obtaining ABIs for now given not optimal surgical candidate.  Continue statin   Bacterial conjunctivitis, OU Patient reports matted eyelids, slightly blurred vision and discharge from bilateral eyes with associated subconjunctival erythema.  Continue Ocuflox eyedrops 4 times daily x7 days   Paroxysmal atrial fibrillation EKG on admission with atrial fibrillation, rate 61; no concerning ischemic findings.  High sensitive troponin within normal limits x2.  Chest x-ray with no acute cardiopulmonary disease process.  Patient's Eliquis was initially held and supported on a heparin drip.  Patient was noted to have bradycardia and her diltiazem dose was reduced to 30 mg p.o. 3 times daily.  Continue metoprolol tartrate 100 mg p.o. twice daily.  Eliquis now resumed on discharge.   HLD: Atorvastatin 10 mg p.o. daily   Hypothyroidism: Levothyroxine 100 mcg p.o. daily   Weakness/deconditioning/debility: Patient currently resides at [facility].  May need to consider hospice given her advanced age and comorbidities.  Discharge Diagnoses:  Principal Problem:   Acute acalculous cholecystitis Active Problems:   HTN (hypertension)   HLD  (hyperlipidemia)   CAD (coronary artery disease)   Hypothyroidism   Chronic diastolic CHF (congestive heart failure) (HCC)   Abdominal infection ([place])   Superior mesenteric artery stenosis (HCC)   PAD (peripheral artery disease) (HCC)   Acalculous cholecystitis   Palliative care by specialist   Goals of care, counseling/discussion    Discharge Instructions  Discharge Instructions     Call MD for:  difficulty breathing, headache or visual disturbances   Complete by: As directed    Call MD for:  extreme fatigue   Complete by: As directed    Call MD for:  persistant dizziness or light-headedness   Complete by: As directed    Call MD for:  persistant nausea and vomiting   Complete by: As directed    Call MD for:  severe uncontrolled pain   Complete by: As directed    Call MD for:  temperature >100.4   Complete by: As directed    Diet - low sodium heart healthy   Complete by: As directed    Increase activity slowly   Complete by: As directed       Allergies as of 12/01/2020   No Known Allergies      Medication List     TAKE these medications    acetaminophen 500 MG tablet Commonly known as: TYLENOL Take 1,000 mg by mouth every 8 (eight) hours as needed (for pain).   amoxicillin-clavulanate 875-125 MG tablet Commonly known as: Augmentin Take 1 tablet by mouth 2 (two) times daily for 5 days.   apixaban 2.5 MG Tabs tablet Commonly known as: ELIQUIS Take 1 tablet (2.5 mg total) by mouth 2 (two) times daily.   atorvastatin 10 MG tablet Commonly known as: LIPITOR Take 1 tablet (10 mg total) by mouth  daily.   diltiazem 30 MG tablet Commonly known as: CARDIZEM Take 1 tablet (30 mg total) by mouth 3 (three) times daily. What changed:  medication strength how much to take   feeding supplement Liqd Take 237 mLs by mouth 3 (three) times daily between meals.   furosemide 20 MG tablet Commonly known as: LASIX Take 1 tablet (20 mg total) by mouth every Monday,  Wednesday, and Friday. What changed:  how much to take when to take this additional instructions   levothyroxine 100 MCG tablet Commonly known as: SYNTHROID Take 100 mcg by mouth daily.   magnesium citrate Soln Take 296 mLs (1 Bottle total) by mouth daily as needed for severe constipation.   metoprolol tartrate 100 MG tablet Commonly known as: LOPRESSOR Take 100 mg by mouth 2 (two) times daily.   Myrbetriq 50 MG Tb24 tablet Generic drug: mirabegron ER Take 50 mg by mouth every evening.   nitroGLYCERIN 0.4 MG SL tablet Commonly known as: NITROSTAT Place 0.4 mg under the tongue every 5 (five) minutes as needed. For chest pain   ofloxacin 0.3 % ophthalmic solution Commonly known as: OCUFLOX Place 1 drop into both eyes 4 (four) times daily for 4 days.   ONE-A-DAY PROACTIVE 65+ PO Take 1 tablet by mouth at bedtime.   OVER THE COUNTER MEDICATION 3 (three) times daily. MedPass 2.0   polyethylene glycol 17 g packet Commonly known as: MIRALAX / GLYCOLAX Take 17 g by mouth 2 (two) times daily as needed for mild constipation.   senna-docusate 8.6-50 MG tablet Commonly known as: Senokot-S Take 1 tablet by mouth 2 (two) times daily as needed for moderate constipation.   trimethoprim 100 MG tablet Commonly known as: TRIMPEX Take 100 mg by mouth in the morning.   vitamin B-12 1000 MCG tablet Commonly known as: CYANOCOBALAMIN Take 1,000 mcg by mouth daily.        Follow-up Information     Holland Commons, FNP. Schedule an appointment as soon as possible for a visit in 1 week(s).   Specialty: Internal Medicine Contact information: [address] [id number] [phone]                No Known Allergies  Consultations: General surgery   Procedures/Studies: NM Hepatobiliary Liver Func  Result Date: 12/01/2020 CLINICAL DATA:  Concern for acalculous cholecystitis. Elevated bilirubin EXAM: NUCLEAR MEDICINE HEPATOBILIARY IMAGING  TECHNIQUE: Sequential images of the abdomen were obtained out to 60 minutes following intravenous administration of radiopharmaceutical. RADIOPHARMACEUTICALS:  5.4 mCi Tc-62m Choletec IV COMPARISON:  MRI 11/26/2020 100 CT 1822 FINDINGS: Prompt clearance radiotracer from blood pool and homogeneous uptake in liver. Counts are evident in the common bile duct by 10 minutes. Counts are present the small bowel by 20 minutes. Gallbladder fails to fill at 50 minutes. 2.0 mg IV morphine was administered to augment filling of the gallbladder. No filling of the gallbladder after morphine administration. IMPRESSION: 1. Non filling of the gallbladder consistent with acute cholecystitis. 2. Patent common bile duct. These results will be called to the ordering clinician or representative by the Radiologist Assistant, and communication documented in the PACS or [employer] Electronically Signed   By: SSuzy BouchardM.D.   On: 12/01/2020 10:32   [place]NAL  Result Date: 11/26/2020 CLINICAL DATA:  Acute kidney injury. EXAM: RENAL / URINARY TRACT ULTRASOUND COMPLETE COMPARISON:  CT a abdomen 11/26/2020 FINDINGS: Markedly limited evaluation. Right Kidney: Renal measurements: 8.7 x 4.6 x 4.3 cm =  volume: 90 mL. Echogenicity within normal limits. No mass or hydronephrosis visualized. Left Kidney: Renal measurements: 8.5 x 4.3 x 4.5 cm = volume: 85 mL. Echogenicity within normal limits. There is a 2.6 x 2.5 x 2.3 cm hypoechoic lesion with no associated mural nodularity, septation, vascularity no solid mass. No hydronephrosis visualized. Urinary bladder: Appears normal for degree of bladder distention. Other: None. IMPRESSION: Markedly limited evaluation with grossly unremarkable kidneys. Electronically Signed   By: Iven Finn M.D.   On: 11/26/2020 18:12   MR 3D Recon At Scanner  Result Date: 11/27/2020 CLINICAL DATA:  Cholecystitis. Question choledochocolonic fistula on CT scan. EXAM: MRI ABDOMEN WITHOUT AND WITH  CONTRAST (INCLUDING MRCP) TECHNIQUE: Multiplanar multisequence MR imaging of the abdomen was performed both before and after the administration of intravenous contrast. Heavily T2-weighted images of the biliary and pancreatic ducts were obtained, and three-dimensional MRCP images were rendered by post processing. CONTRAST:  69m GADAVIST GADOBUTROL 1 MMOL/ML IV SOLN COMPARISON:  CT 11/26/2020 and 05/26/2020. FINDINGS: Despite efforts by the technologist and patient, mild motion artifact is present on today's exam and could not be eliminated. This reduces exam sensitivity and specificity. Lower chest: Trace right pleural effusion. The visualized lower chest otherwise appears unremarkable. Hepatobiliary: Transient hypervascularity inferiorly in the left hepatic lobe again noted. No suspicious focal hepatic lesions are identified. There is mild extrahepatic biliary dilatation with the common hepatic duct measuring 10 mm in diameter. No evidence of choledocholithiasis. In correlation with previous CTs, there is gas within the gallbladder lumen which demonstrates diffuse wall thickening. There is no definite gas within the remainder of the biliary system. 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 Pancreas: Unremarkable. No pancreatic ductal dilatation or surrounding inflammatory changes. Spleen: Normal in size without focal abnormality. Adrenals/Urinary Tract: Both adrenal glands appear normal. Stable cyst in the upper pole of the left kidney. No evidence of enhancing renal mass or hydronephrosis. Stomach/Bowel: There is a periampullary duodenal diverticulum. 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 Vascular/Lymphatic: There are no enlarged abdominal lymph nodes. Aortic and branch vessel atherosclerosis with proximal SMA stenosis, better seen on recent CTA. Other: No evidence of abdominal wall hernia or  ascites. Musculoskeletal: No acute or significant osseous findings. Multilevel spondylosis. IMPRESSION: 1. 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 As seen on CT, there is diffuse gallbladder wall thickening which could reflect acalculous cholecystitis. 2. Mild extrahepatic biliary dilatation without evidence of choledocholithiasis. 3. 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 Aortic Atherosclerosis (ICD10-I70.0). Electronically Signed   By: WRichardean SaleM.D.   On: 11/27/2020 08:05   MR ABDOMEN MRCP W WO CONTAST  Result Date: 11/27/2020 CLINICAL DATA:  Cholecystitis. Question choledochocolonic fistula on CT scan. EXAM: MRI ABDOMEN WITHOUT AND WITH CONTRAST (INCLUDING MRCP) TECHNIQUE: Multiplanar multisequence MR imaging of the abdomen was performed both before and after the administration of intravenous contrast. Heavily T2-weighted images of the biliary and pancreatic ducts were obtained, and three-dimensional MRCP images were rendered by post processing. CONTRAST:  575mGADAVIST GADOBUTROL 1 MMOL/ML IV SOLN COMPARISON:  CT 11/26/2020 and 05/26/2020. FINDINGS: Despite efforts by the technologist and patient, mild motion artifact is present on today's exam and could not be eliminated. This reduces exam sensitivity and specificity. Lower chest: Trace right pleural effusion. The visualized lower chest otherwise appears unremarkable. Hepatobiliary: Transient  hypervascularity inferiorly in the left hepatic lobe again noted. No suspicious focal hepatic lesions are identified. There is mild extrahepatic biliary dilatation with the common hepatic duct measuring 10 mm in diameter. No evidence of choledocholithiasis. In correlation with previous CTs, there is gas within the gallbladder lumen which demonstrates diffuse wall thickening. There is no definite gas within the remainder of the biliary system. The suspected chronic  choledochocolonic fistula on CT is not well seen by MRI, specially due to the motion. Pancreas: Unremarkable. No pancreatic ductal dilatation or surrounding inflammatory changes. Spleen: Normal in size without focal abnormality. Adrenals/Urinary Tract: Both adrenal glands appear normal. Stable cyst in the upper pole of the left kidney. No evidence of enhancing renal mass or hydronephrosis. Stomach/Bowel: There is a periampullary duodenal diverticulum. 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 Vascular/Lymphatic: There are no enlarged abdominal lymph nodes. Aortic and branch vessel atherosclerosis with proximal SMA stenosis, better seen on recent CTA. Other: No evidence of abdominal wall hernia or ascites. Musculoskeletal: No acute or significant osseous findings. Multilevel spondylosis. IMPRESSION: 1. 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 As seen on CT, there is diffuse gallbladder wall thickening which could reflect acalculous cholecystitis. 2. Mild extrahepatic biliary dilatation without evidence of choledocholithiasis. 3. 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 Aortic Atherosclerosis (ICD10-I70.0). Electronically Signed   By: Richardean Sale M.D.   On: 11/27/2020 08:05   CT Angio Abd/Pel W and/or Wo Contrast  Addendum Date: 11/26/2020   ADDENDUM REPORT: 11/26/2020 17:27 ADDENDUM: There is a typographical error in the IMPRESSION, #3 should read as follows: 3. 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 Electronically Signed   By: Lucrezia Europe M.D.   On: 11/26/2020 17:27   Result Date: 11/26/2020 CLINICAL DATA:  Near syncope, acute mesenteric ischemia EXAM: CTA ABDOMEN AND PELVIS WITH CONTRAST TECHNIQUE: Multidetector CT imaging of the abdomen and pelvis was performed using  the standard protocol during bolus administration of intravenous contrast. Multiplanar reconstructed images and MIPs were obtained and reviewed to evaluate the vascular anatomy. CONTRAST:  71m OMNIPAQUE IOHEXOL 350 MG/ML SOLN COMPARISON:  05/26/2020 FINDINGS: VASCULAR Coronary calcifications. At least mild cardiomegaly with right atrial enlargement. Aorta: Extensive calcified atheromatous plaque. No aneurysm, dissection, or stenosis. Celiac: Calcified ostial plaque resulting in short segment mild stenosis, patent distally. SMA: Partially calcified plaque extending from the origin approximately 1.7 cm resulting in segmental stenosis of at least moderate severity, the distal vessel mildly atheromatous but patent with classic branch anatomy. Renals: Single left, with calcified ostial plaque over length of approximately 1 cm resulting in at least mild stenosis, patent distally. Single right, with calcified ostial plaque resulting in mild short-segment stenosis, patent distally. IMA: Patent, with short-segment origin stenosis related to aortic wall plaque, and atheromatous irregularity of indeterminate hemodynamic significance approximately 2 cm distally, with patent distal branch anatomy. Inflow: Moderate calcified plaque without high-grade stenosis. Mild tortuosity. Proximal Outflow: Calcified plaque at the left SFA origin resulting in stenosis of at least mild severity, atheromatous but patent in the visualized distal portion. Veins: Patent hepatic veins, portal vein, SVC, splenic vein, bilateral renal veins, iliac confluence and IVC. Review of the MIP images confirms the above findings. NON-VASCULAR Lower chest: No pleural or pericardial effusion. Hepatobiliary: Transient hepatic attenuation difference in arterial phase scanning, with no discrete lesion evident on venous phase. Calcified  subcapsular lesion in segment 7 stable since 02/14/2012. There is a tubular subhepatic process which appears confluent with the  hepatic flexure of the colon and has been consistently present on prior studies, containing some gas and fluid , suggestive of gallbladder with cholecystocolonic fistula. There is new marked thickening of the wall of the presumed gallbladder, and mucosal enhancement. No calcified gallstones. Pancreas: Unremarkable. No pancreatic ductal dilatation or surrounding inflammatory changes. Spleen: Normal in size without focal abnormality. Adrenals/Urinary Tract: Adrenal glands unremarkable. 2.2 cm probable cyst, upper pole left kidney, stable since 02/10/2020. No hydronephrosis. Urinary bladder is physiologically distended. Stomach/Bowel: Stomach is incompletely distended. Duodenal diverticulum. The small bowel is decompressed. Appendix not identified. 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 Apparent patulous segment of colon anteriorly at the level of the aortic bifurcation which is stable since 05/26/2020. Innumerable distal descending and sigmoid diverticula without regional inflammatory change. Lymphatic: No abdominal or pelvic adenopathy. Reproductive: Status post hysterectomy. No adnexal masses. Other: Bilateral pelvic phleboliths.  No ascites.  No   free air. Musculoskeletal: Multilevel spondylitic changes in the lumbar spine. Osteitis pubis. No acute fracture or worrisome bone lesion. IMPRESSION: 1. 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 Patent celiac axis and inferior mesenteric artery may provide adequate collateral circulation to the mesentery. 2. Left SFA origin stenosis of possible hemodynamic significance. Correlate with clinical symptomatology and ABIs. 3. 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 4. Descending and sigmoid diverticulosis. Electronically Signed: By: Lucrezia Europe M.D. On: 11/26/2020 15:13     Subjective: Patient seen examined bedside, resting  comfortably.  Just completed HIDA scan which is abnormal with no filling within the gallbladder.  Discussed with general surgery who recommend 10-day course of antibiotics given her poor surgical candidacy.  Discussed with patient's son, but who agrees with management and wishes his mother to return to [facility] today.  No other questions or concerns at this time.  Patient denies headache, no fever, no chest pain, no shortness of breath, no current abdominal pain.  No acute events overnight per nursing staff.  Discharge back to [facility] today.  Discharge Exam: Vitals:   11/30/20 2142 12/01/20 0522  BP: 131/85 128/82  Pulse: 78 63  Resp: 18 18  Temp: 98.4 F (36.9 C) 98.2 F (36.8 C)  SpO2:  95%   Vitals:   11/30/20 1406 11/30/20 2142 12/01/20 0520 12/01/20 0522  BP: (!) 128/58 131/85  128/82  Pulse: 66 78  63  Resp: '18 18  18  '$ Temp: 98.6 F (37 C) 98.4 F (36.9 C)  98.2 F (36.8 C)  TempSrc: Oral Oral  Oral  SpO2: 97%   95%  Weight:   51.1 kg   Height:        General: Pt is alert, awake, not in acute distress, elderly in appearance, thin/cachectic Cardiovascular: RRR, S1/S2 +, no rubs, no gallops Respiratory: CTA bilaterally, no wheezing, no rhonchi, on room air Abdominal: Soft, NT, ND, bowel sounds + Extremities: no edema, no cyanosis    The results of significant diagnostics from this hospitalization (including imaging, microbiology, ancillary and laboratory) are listed below for reference.     Microbiology: Recent Results (from the past 240 hour(s))  Resp Panel by RT-PCR (Flu A&B, Covid) Nasopharyngeal Swab     Status: None   Collection Time: 11/26/20  4:40 PM   Specimen: Nasopharyngeal Swab; Nasopharyngeal(NP) swabs in vial transport medium  Result Value Ref Range Status   SARS  Coronavirus 2 by RT PCR NEGATIVE NEGATIVE Final    Comment: (NOTE) SARS-CoV-2 target nucleic acids are NOT DETECTED.  The SARS-CoV-2 RNA is generally detectable in upper  respiratory specimens during the acute phase of infection. The lowest concentration of SARS-CoV-2 viral copies this assay can detect is 138 copies/mL. A negative result does not preclude SARS-Cov-2 infection and should not be used as the sole basis for treatment or other patient management decisions. A negative result may occur with  improper specimen collection/handling, submission of specimen other than nasopharyngeal swab, presence of viral mutation(s) within the areas targeted by this assay, and inadequate number of viral copies(<138 copies/mL). A negative result must be combined with clinical observations, patient history, and epidemiological information. The expected result is Negative.  Fact Sheet for Patients:  EntrepreneurPulse.com.au  Fact Sheet for Healthcare Providers:  IncredibleEmployment.be  This test is no t yet approved or cleared by the [place] FDA and  has been authorized for detection and/or diagnosis of SARS-CoV-2 by FDA under an Emergency Use Authorization (EUA). This EUA will remain  in effect (meaning this test can be used) for the duration of the COVID-19 declaration under Section 564(b)(1) of the Act, 21 U.S.C.section 360bbb-3(b)(1), unless the authorization is terminated  or revoked sooner.       Influenza A by PCR NEGATIVE NEGATIVE Final   Influenza B by PCR NEGATIVE NEGATIVE Final    Comment: (NOTE) The Xpert Xpress SARS-CoV-2/FLU/RSV plus assay is intended as an aid in the diagnosis of influenza from Nasopharyngeal swab specimens and should not be used as a sole basis for treatment. Nasal washings and aspirates are unacceptable for Xpert Xpress SARS-CoV-2/FLU/RSV testing.  Fact Sheet for Patients: EntrepreneurPulse.com.au  Fact Sheet for Healthcare Providers: IncredibleEmployment.be  This test is not yet approved or cleared by the [place] FDA and has been  authorized for detection and/or diagnosis of SARS-CoV-2 by FDA under an Emergency Use Authorization (EUA). This EUA will remain in effect (meaning this test can be used) for the duration of the COVID-19 declaration under Section 564(b)(1) of the Act, 21 U.S.C. section 360bbb-3(b)(1), unless the authorization is terminated or revoked.  Performed at [facility], [place] [address], [place], [place] [address]      Labs: BNP (last 3 results) Recent Labs    02/10/20 1836  BNP [id number]*   Basic Metabolic Panel: Recent Labs  Lab 11/27/20 0418 11/28/20 0534 11/29/20 0501 11/30/20 0503 12/01/20 0512  NA 135 134* 135 137 137  K 4.6 4.1 4.3 4.3 4.6  CL 98 97* 100 103 102  CO2 '29 26 27 28 28  '$ GLUCOSE 94 92 87 91 95  BUN 32* 28* 24* 20 27*  CREATININE 1.54* 1.41* 1.45* 1.26* 1.33*  CALCIUM 8.9 8.7* 8.8* 9.0 9.1  MG  --  2.3  --   --   --    Liver Function Tests: Recent Labs  Lab 11/26/20 1223 11/27/20 0418  AST 26 24  ALT 16 12  ALKPHOS 71 67  BILITOT 0.9 1.2  PROT 7.3 6.9  ALBUMIN 3.4* 3.1*   No results for input(s): LIPASE, AMYLASE in the last 168 hours. No results for input(s): AMMONIA in the last 168 hours. CBC: Recent Labs  Lab 11/26/20 1223 11/27/20 0418 11/28/20 0534 11/29/20 0501 11/30/20 0503 12/01/20 0512  WBC 16.9* 13.6* 7.5 7.3 6.8 8.0  NEUTROABS 14.0*  --   --   --   --   --   HGB 14.7 14.7 14.4  14.5 14.6 14.1  HCT 44.8 44.9 44.6 45.2 45.5 44.5  MCV 98.2 98.0 98.0 98.7 99.1 100.5*  PLT 165 178 182 197 188 204   Cardiac Enzymes: No results for input(s): CKTOTAL, CKMB, CKMBINDEX, TROPONINI in the last 168 hours. BNP: Invalid input(s): POCBNP CBG: Recent Labs  Lab 11/26/20 1159  GLUCAP 139*   D-Dimer No results for input(s): DDIMER in the last 72 hours. Hgb A1c No results for input(s): HGBA1C in the last 72 hours. Lipid Profile No results for input(s): CHOL, HDL, LDLCALC, TRIG, CHOLHDL, LDLDIRECT in the last 72  hours. Thyroid function studies No results for input(s): TSH, T4TOTAL, T3FREE, THYROIDAB in the last 72 hours.  Invalid input(s): FREET3 Anemia work up No results for input(s): VITAMINB12, FOLATE, FERRITIN, TIBC, IRON, RETICCTPCT in the last 72 hours. Urinalysis    Component Value Date/Time   COLORURINE YELLOW 08/07/2020 [place] 08/07/2020 0614   LABSPEC 1.020 08/07/2020 0614   PHURINE 5.0 08/07/2020 0614   GLUCOSEU NEGATIVE 08/07/2020 0614   HGBUR NEGATIVE 08/07/2020 0614   BILIRUBINUR NEGATIVE 08/07/2020 0614   BILIRUBINUR negative 02/10/2020 1608   KETONESUR NEGATIVE 08/07/2020 0614   PROTEINUR NEGATIVE 08/07/2020 0614   UROBILINOGEN 1.0 02/10/2020 1608   UROBILINOGEN 0.2 02/13/2012 0221   NITRITE NEGATIVE 08/07/2020 0614   LEUKOCYTESUR NEGATIVE 08/07/2020 [id number]   Sepsis Labs Invalid input(s): PROCALCITONIN,  WBC,  LACTICIDVEN Microbiology Recent Results (from the past 240 hour(s))  Resp Panel by RT-PCR (Flu A&B, Covid) Nasopharyngeal Swab     Status: None   Collection Time: 11/26/20  4:40 PM   Specimen: Nasopharyngeal Swab; Nasopharyngeal(NP) swabs in vial transport medium  Result Value Ref Range Status   SARS Coronavirus 2 by RT PCR NEGATIVE NEGATIVE Final    Comment: (NOTE) SARS-CoV-2 target nucleic acids are NOT DETECTED.  The SARS-CoV-2 RNA is generally detectable in upper respiratory specimens during the acute phase of infection. The lowest concentration of SARS-CoV-2 viral copies this assay can detect is 138 copies/mL. A negative result does not preclude SARS-Cov-2 infection and should not be used as the sole basis for treatment or other patient management decisions. A negative result may occur with  improper specimen collection/handling, submission of specimen other than nasopharyngeal swab, presence of viral mutation(s) within the areas targeted by this assay, and inadequate number of viral copies(<138 copies/mL). A negative result must be  combined with clinical observations, patient history, and epidemiological information. The expected result is Negative.  Fact Sheet for Patients:  EntrepreneurPulse.com.au  Fact Sheet for Healthcare Providers:  IncredibleEmployment.be  This test is no t yet approved or cleared by the [place] FDA and  has been authorized for detection and/or diagnosis of SARS-CoV-2 by FDA under an Emergency Use Authorization (EUA). This EUA will remain  in effect (meaning this test can be used) for the duration of the COVID-19 declaration under Section 564(b)(1) of the Act, 21 U.S.C.section 360bbb-3(b)(1), unless the authorization is terminated  or revoked sooner.       Influenza A by PCR NEGATIVE NEGATIVE Final   Influenza B by PCR NEGATIVE NEGATIVE Final    Comment: (NOTE) The Xpert Xpress SARS-CoV-2/FLU/RSV plus assay is intended as an aid in the diagnosis of influenza from Nasopharyngeal swab specimens and should not be used as a sole basis for treatment. Nasal washings and aspirates are unacceptable for Xpert Xpress SARS-CoV-2/FLU/RSV testing.  Fact Sheet for Patients: EntrepreneurPulse.com.au  Fact Sheet for Healthcare Providers: IncredibleEmployment.be  This test is not yet approved or  cleared by the [place] and has been authorized for detection and/or diagnosis of SARS-CoV-2 by FDA under an Emergency Use Authorization (EUA). This EUA will remain in effect (meaning this test can be used) for the duration of the COVID-19 declaration under Section 564(b)(1) of the Act, 21 U.S.C. section 360bbb-3(b)(1), unless the authorization is terminated or revoked.  Performed at [facility], [place] [address], [place], [place] [address]      Time coordinating discharge: Over 30 minutes  SIGNED:   Madysin Crisp J [place] DO  [facility] 12/01/2020, 11:44 AM

## 2020-12-01 NOTE — Progress Notes (Signed)
Progress Note     Subjective: Continued intermittent nausea and abdominal pain. No emesis and she ate yesterday. Abdominal pain remains suprapubic and LLQ and is very minimal this am. BM this am  Objective: Vital signs in last 24 hours: Temp:  [98.2 F (36.8 C)-98.6 F (37 C)] 98.2 F (36.8 C) (08/23 0522) Pulse Rate:  [63-78] 63 (08/23 0522) Resp:  [15-18] 18 (08/23 0522) BP: (126-131)/(58-91) 128/82 (08/23 0522) SpO2:  [95 %-97 %] 95 % (08/23 0522) Weight:  [51.1 kg] 51.1 kg (08/23 0520)    Intake/Output from previous day: 08/22 0701 - 08/23 0700 In: 690 [P.O.:690] Out: -  Intake/Output this shift: No intake/output data recorded.  PE: General: pleasant, WD, female who is laying in bed in NAD HEENT: head is normocephalic, atraumatic. Mouth is pink and moist Heart: regular, rate, and rhythm. Palpable radial pulses bilaterally Lungs: Respiratory effort nonlabored Abd: soft, ND, +BS, mild focal TTP over suprapubic region. Well healed midline scar MSK: all 4 extremities are symmetrical with no cyanosis, clubbing, or edema. Skin: warm and dry with no masses, lesions, or rashes Psych: alert and oriented to self, place, and situation. States the year is 2002  Lab Results:  Recent Labs    11/30/20 0503 12/01/20 0512  WBC 6.8 8.0  HGB 14.6 14.1  HCT 45.5 44.5  PLT 188 204    BMET Recent Labs    11/29/20 0501 11/30/20 0503  NA 135 137  K 4.3 4.3  CL 100 103  CO2 27 28  GLUCOSE 87 91  BUN 24* 20  CREATININE 1.45* 1.26*  CALCIUM 8.8* 9.0    PT/INR No results for input(s): LABPROT, INR in the last 72 hours. CMP     Component Value Date/Time   NA 137 11/30/2020 0503   K 4.3 11/30/2020 0503   CL 103 11/30/2020 0503   CO2 28 11/30/2020 0503   GLUCOSE 91 11/30/2020 0503   BUN 20 11/30/2020 0503   CREATININE 1.26 (H) 11/30/2020 0503   CREATININE 1.22 (H) 04/21/2016 1223   CALCIUM 9.0 11/30/2020 0503   PROT 6.9 11/27/2020 0418   ALBUMIN 3.1 (L)  11/27/2020 0418   AST 24 11/27/2020 0418   ALT 12 11/27/2020 0418   ALKPHOS 67 11/27/2020 0418   BILITOT 1.2 11/27/2020 0418   GFRNONAA 39 (L) 11/30/2020 0503   GFRAA 86 (L) 06/14/2012 0218   Lipase  No results found for: LIPASE     Studies/Results: No results found.  Anti-infectives: Anti-infectives (From admission, onward)    Start     Dose/Rate Route Frequency Ordered Stop   11/26/20 2200  piperacillin-tazobactam (ZOSYN) IVPB 2.25 g        2.25 g 100 mL/hr over 30 Minutes Intravenous Every 8 hours 11/26/20 1837     11/26/20 1530  piperacillin-tazobactam (ZOSYN) IVPB 3.375 g        3.375 g 100 mL/hr over 30 Minutes Intravenous  Once 11/26/20 1529 11/26/20 1636        Assessment/Plan  Cholecystocolonic fistula Acalculous cholecystitis - she has had intermittent abdominal pain and abnormal findings regarding gallbladder on imaging. Im not sure if her symptoms are completely attributable to her gallbladder - await HIDA scan. This am she states her main goal is to get out of the hospital ASAP and she is not sure if she would want a cholecystostomy tube if indicated especially if it would delay discharge  FEN: dys1 ID: [email or web address] 8/18>> VTE: heparin  Dipso: await HIDA scan  LOS: 5 days    Micro Surgery 12/01/2020, 7:31 AM Please see Amion for pager number during day hours 7:00am-4:30pm

## 2020-12-01 NOTE — Progress Notes (Signed)
[place] for heparin  Indication: atrial fibrillation - bridge therapy while apixaban on hold  No Known Allergies  Patient Measurements: Height: '5\' 5"'$  (165.1 cm) Weight: 51.1 kg (112 lb 10.5 oz) IBW/kg (Calculated) : 57 Heparin Dosing Weight: 49.9 kg   Vital Signs: Temp: 98.2 F (36.8 C) (08/23 0522) Temp Source: Oral (08/23 0522) BP: 128/82 (08/23 0522) Pulse Rate: 63 (08/23 0522)  Labs: Recent Labs    11/29/20 0501 11/30/20 0503 12/01/20 0512  HGB 14.5 14.6 14.1  HCT 45.2 45.5 44.5  PLT 197 188 204  APTT 72* 74* 45*  HEPARINUNFRC >1.10* 0.81* 0.32  CREATININE 1.45* 1.26*  --      Estimated Creatinine Clearance: 20.1 mL/min (A) (by C-G formula based on SCr of 1.26 mg/dL (H)).   Medical History: Past Medical History:  Diagnosis Date   CAD (coronary artery disease)    a. 07/2007 PCI/Promus DES to LAD;  b. 02/2010 PCI/Promus DES to dRCA.;  c. 12/2010 Lexi MV: EF 88%, no isch/infarct.;  d. 02/2012 Cath: LM nl, LAD 20p, patent mid stent, 60d, D1 sm 70ost, D2 sm, diff, LCX sm , OM1 diff 20m(no change), RCA 40ost, patent stent, PD/PL nl, EF 75% w/o rwma's.   Carotid bruit    Diverticulosis    GERD (gastroesophageal reflux disease)    Hemorrhoids    History of echocardiogram    a. 06/2012 Echo: EF nl, mild LVH.   HLD (hyperlipidemia)    HTN (hypertension)    Hypothyroidism    IBS (irritable bowel syndrome)     Medications:  Apixaban 2.5 mg po BID, last dose 8/18 at 08 am  Assessment: 87yo F on apixaban 2.5 mg po BID PTA for Afib.  Pharmacy consulted to dose heparin for bridge therapy while apixaban on hold during work up of possible fistula between gallbladder and colon.  Apixa 2.5 po bid last dose 8/18 at 08 am CBC WNL, SCr 1.47  12/01/2020: aPTT 45 sec - SUBtherapeutic on heparin 650 units/hr  Heparin level 0.32, decreasing as anticipated as effects of recent apixaban beginning to clear. Trending down, but not yet  correlating with aPTT  CBC WNL No bleeding issues reported Scr 1.26 mg/dl, trending down   Goal of Therapy:  Heparin level 0.3-0.7 units/ml aPTT 66 - 102 seconds Monitor platelets by anticoagulation protocol: Yes   Plan:  Increase heparin infusion to 700 units/hr Recheck aPTT in 8hrs CBC, aPTT & heparin level daily Will adjust heparin using aPTT until apixaban has cleared. Awaiting plans for percutaneous cholecystotomy.  EPeggyann Juba PharmD, BCPS Pharmacy: [phone]/23/2022 7:10 AM

## 2020-12-01 NOTE — TOC Initial Note (Signed)
Transition of Care [facility]) - Initial/Assessment Note    Patient Details  Name: [name] MRN: [id number] Date of Birth: 1920-11-18  [facility]) CM/SW Contact:    Lynnell Catalan, RN Phone Number: 12/01/2020, 2:40 PM  Clinical Narrative:                 Pt admitted from [facility]. Spoke with Abran Richard from [facility] about pt returning to facility today. PT eval faxed to [facility] per her request. Per Abran Richard, pt was mostly independent prior to admission and would need to get some rehab at [facility] prior to returning. Permission received from son Nicole Kindred to fax out FL2 to area SNFs. Insurance auth for SNF and ambulance transport started with  [employer].  Expected Discharge Plan: Skilled Nursing Facility Barriers to Discharge: No Barriers Identified      Expected Discharge Plan and Services Expected Discharge Plan: [place]   Discharge Planning Services: CM Consult   Living arrangements for the past 2 months: [place] Expected Discharge Date: 12/01/20                  [facility] Arranged: PT     Prior Living Arrangements/Services Living arrangements for the past 2 months: [place] Lives with:: Facility Resident          Need for Family Participation in Patient Care: Yes (Comment) Care giver support system in place?: Yes (comment) Current home services: Home PT    Activities of Daily Living Home Assistive Devices/Equipment: Environmental consultant (specify type), Eyeglasses ADL Screening (condition at time of admission) Patient's cognitive ability adequate to safely complete daily activities?: Yes Is the patient deaf or have difficulty hearing?: No Does the patient have difficulty seeing, even when wearing glasses/contacts?: No Does the patient have difficulty concentrating, remembering, or making decisions?: Yes (pt is unsure) Patient able to express need for assistance with ADLs?: Yes Does the patient have difficulty  dressing or bathing?: Yes Independently performs ADLs?: No Communication: Independent Dressing (OT): Needs assistance Is this a change from baseline?: Pre-admission baseline Grooming: Independent Feeding: Independent Bathing: Needs assistance Is this a change from baseline?: Pre-admission baseline Toileting: Needs assistance Is this a change from baseline?: Pre-admission baseline In/Out Bed: Needs assistance Is this a change from baseline?: Pre-admission baseline Walks in Home: Needs assistance Is this a change from baseline?: Pre-admission baseline Does the patient have difficulty walking or climbing stairs?: Yes Weakness of Legs: Both Weakness of Arms/Hands: Both  Permission Sought/Granted                  Emotional Assessment Appearance:: Appears stated age     Orientation: : Oriented to Self, Oriented to Place, Oriented to  Time, Oriented to Situation      Admission diagnosis:  Acalculous cholecystitis [K81.9] Acute acalculous cholecystitis [K81.0] Cholecystitis [K81.9] Fistula [L98.8] AKI (acute kidney injury) ([place]) [N17.9] Near syncope [R55] Abdominal infection ([place]) [K65.9] Abdominal pain [R10.9] Atrial fibrillation, unspecified type [facility]) [I48.91] Patient Active Problem List   Diagnosis Date Noted   Acalculous cholecystitis    Palliative care by specialist    Goals of care, counseling/discussion    Superior mesenteric artery stenosis ([place]) 11/29/2020   PAD (peripheral artery disease) ([place]) 11/29/2020   Abdominal infection ([place]) 11/27/2020   Acute acalculous cholecystitis 11/26/2020   Protein-calorie malnutrition, severe 08/11/2020   Rapid atrial fibrillation ([place]) 05/26/2020   Acute pulmonary embolism ([place]) 02/10/2020   Chronic diastolic CHF (congestive heart failure) ([place]) 02/10/2020  New onset atrial fibrillation ([place]) 02/10/2020   Lung nodule 02/10/2020   History of echocardiogram    GERD (gastroesophageal reflux disease)    Diverticulosis     Carotid bruit    IBS (irritable bowel syndrome)    Hypothyroidism    HTN (hypertension)    HLD (hyperlipidemia)    CAD (coronary artery disease)    Atypical chest pain 06/14/2012   Chest pain 02/12/2012   Fatigue 04/22/2011   CAROTID BRUIT 03/19/2009   HEMORRHOIDS 08/29/2008   GERD 08/29/2008   DIVERTICULOSIS, COLON 08/29/2008   HYPERLIPIDEMIA 07/04/2008   HYPERTENSION, BENIGN 07/04/2008   CAD, NATIVE VESSEL 07/04/2008   IRRITABLE BOWEL SYNDROME 07/04/2008   PCP:  Holland Commons, FNP Pharmacy:   [id number] [place] [place] [address]Phone: [phone]: [phone] [place]Phone: [phone]: [phone]    Social Determinants of Health (SDOH) Interventions    Readmission Risk Interventions Readmission Risk Prevention Plan 12/01/2020  Transportation Screening Complete  PCP or Specialist Appt within 3-5 Days Complete  HRI or [place]  Social Work Consult for [place]Counseling Complete  Palliative Care Screening Not Applicable  Medication Review (Press photographer Complete  Some recent data might be hidden

## 2020-12-01 NOTE — Progress Notes (Signed)
PROGRESS NOTE    [name]  [id number] DOB: 10-May-1920 DOA: 11/26/2020 PCP: Holland Commons, FNP    Brief Narrative:  [name] is a 87 year old female with past medical history significant for paroxysmal atrial fibrillation on anticoagulation with Eliquis, CKD stage IIIa, chronic diastolic congestive heart failure, CAD, who presented from [facility] via EMS for near syncopal episode.  Patient does not recall event and staff reported patient did not fall and no loss of consciousness.  Patient has been reporting pain to the abdomen without nausea/vomiting/diarrhea.  Denies chest pain.  In the ED, her 91.1 F, HR 89, RR 16, BP 112/68, SPO2 97% on room air.  Sodium 133, potassium 4.2, chloride 96, CO2 29, BUN 33, creatinine 1.47, glucose 128.  AST 26, ALT 16, total bilirubin 0.9.  WBC 16.9, hemoglobin 14.7, platelets 165.  High sensitive troponin 9> 8.  Cova-19 PCR negative.  Influenza A/B PCR negative.  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  General surgery was consulted.  Patient was started on IV antibiotics.  TRH consulted for further evaluation and management.   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  Patient notable with elevated WBC count of 16.9 and procalcitonin 1.89.  LFTs within normal limits. 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  No blood cultures performed in the ED or on admission.  HIDA scan notable for no filling within the gallbladder but patent common bile duct.  Discussed with general surgery, Dr. Windle Guard who recommended to continue antibiotics to complete 10-day course.  Not a operative candidate and patient/family want to hold off on any aggressive measures such as percutaneous cholecystostomy at this point. --WBC 16.9>13.6>7.5>7.3>6.8>8.0 --Procalcitonin 1.89>0.90>0.47 --Zosyn transitioned to Augmentin to complete 10 day course --Follow WBC daily --Supportive care  Near syncopal episode Etiology likely secondary to abdominal infection as above with likely acalculous cholecystitis.  No fall or loss of consciousness.  TTE 02/11/2020 with LVEF 60 to 65%, no LV R WMA, mild LVH, no aortic valve stenosis.  Carotid duplex ultrasound 08/06/2020 with no significant flow restrictions bilateral carotid arteries. --Continue treatment as above --Continue to monitor on telemetry  AKI on CKD stage IIIa Baseline creatinine 1.05.  Creatinine on admission 1.47.  Suspect prerenal azotemia from abdominal infection as above.  Renal ultrasound, limited exam but grossly unremarkable kidneys. --Cr 1.47>1.54>1.41>1.45>1.26>1.33 --Avoid nephrotoxins, renally dose all medications --Repeat BMP in a.m. --Continue to monitor  bladder scans as needed  Proximal SMA stenosis CT angiogram abdomen/pelvis notable for proximal  SMA stenosis with patent celiac/IMA likely providing collateral circulation to the mesentery.  Likely chronic. --Continue atorvastatin 10 mg p.o. daily  Left SFA origin stenosis CT angiogram abdomen/pelvis notable for left SFA stenosis.  Patient not complaining of claudication or leg pain.  Hold on obtaining ABIs for now given not optimal surgical candidate.  Bacterial conjunctivitis, OU Patient reports matted eyelids, slightly blurred vision and discharge from bilateral eyes with associated subconjunctival erythema. --Ocuflox eyedrops 4 times daily x7 days  Paroxysmal atrial fibrillation EKG on admission with atrial fibrillation, rate 61; no concerning ischemic findings.  High sensitive troponin within normal limits x2.  Chest x-ray with no acute cardiopulmonary disease process. --Holding Eliquis, on heparin drip --Decreased diltiazem to 30 mg p.o. 3 times daily given borderline low heart rate (hold for SBP <100 or HR<60)  HLD: Atorvastatin 10 mg p.o. daily  Hypothyroidism: Levothyroxine 100 mcg p.o. daily  Weakness/deconditioning/debility: Patient currently resides at [facility].  She has become deconditioned due to her illness as above; in which she was fully independent prior to hospitalization.  --PT/OT recommends SNF; insurance authorization pending   DVT prophylaxis: Heparin drip   Code Status: DNR Family Communication: Updated patient's son, [name] via telephone this morning  Disposition Plan:  Level of care: Telemetry Status is: Inpatient  Remains inpatient appropriate because:Ongoing active pain requiring inpatient pain management, Ongoing diagnostic testing needed not appropriate for outpatient work up, Unsafe d/c plan, IV treatments appropriate due to intensity of illness or inability to take PO, and Inpatient level of care appropriate due to severity of  illness  Dispo: The patient is from: ALF              Anticipated d/c is to: ALF              Patient currently is not medically stable to d/c.   Difficult to place patient No  Consultants:  General surgery  Procedures:  None  Antimicrobials:  Zosyn 8/19 Ocuflox eyedrops 8/21>>   Subjective: Patient seen examined bedside, resting comfortably.  Lying in bed.  Returned from HIDA scan.  General surgery recommends conservative measures with 10-day course of antibiotics.  PT/OT recommend SNF placement.  From [facility], pending insurance authorization.  No family present this morning, updated patient's son via telephone.  Denies headache, no visual changes, no chest pain, no palpitations, no shortness of breath, no cough/congestion, no fever/chills/night sweats, no nausea/vomiting/diarrhea.  No acute events overnight per nursing staff.  Objective: Vitals:   12/01/20 0520 12/01/20 0522 12/01/20 1221 12/01/20 1322  BP:  128/82 (!) 129/100 113/66  Pulse:  63  74  Resp:  18  20  Temp:  98.2 F (36.8 C)  97.8 F (36.6 C)  TempSrc:  Oral  Oral  SpO2:  95%  95%  Weight: 51.1 kg     Height:       No intake or output data in the 24 hours ending 12/01/20 1551  Filed Weights   11/28/20 0500 11/29/20 0700 12/01/20 0520  Weight: 50.1 kg 53 kg 51.1 kg    Examination:  General exam: Appears calm and comfortable, elderly and chronically ill in appearance Respiratory system: Clear to auscultation. Respiratory effort normal.  On room air Cardiovascular system: S1 & S2 heard, RRR. No JVD, murmurs, rubs, gallops or clicks. No pedal edema. Gastrointestinal system: Abdomen is nondistended, soft, NTTP. No organomegaly or masses felt. Normal bowel sounds heard. Central nervous system: Alert and oriented. No focal neurological deficits. Extremities: Symmetric  5 x 5 power. Skin: Multiple areas of ecchymosis in various stages of healing to extremities Psychiatry: Judgement and insight appear  normal. Mood & affect appropriate.     Data Reviewed: I have personally reviewed following labs and imaging studies  CBC: Recent Labs  Lab 11/26/20 1223 11/27/20 0418 11/28/20 0534 11/29/20 0501 11/30/20 0503 12/01/20 0512  WBC 16.9* 13.6* 7.5 7.3 6.8 8.0  NEUTROABS 14.0*  --   --   --   --   --   HGB 14.7 14.7 14.4 14.5 14.6 14.1  HCT 44.8 44.9 44.6 45.2 45.5 44.5  MCV 98.2 98.0 98.0 98.7 99.1 100.5*  PLT 165 178 182 197 188 0000000   Basic Metabolic Panel: Recent Labs  Lab 11/27/20 0418 11/28/20 0534 11/29/20 0501 11/30/20 0503 12/01/20 0512  NA 135 134* 135 137 137  K 4.6 4.1 4.3 4.3 4.6  CL 98 97* 100 103 102  CO2 '29 26 27 28 28  '$ GLUCOSE 94 92 87 91 95  BUN 32* 28* 24* 20 27*  CREATININE 1.54* 1.41* 1.45* 1.26* 1.33*  CALCIUM 8.9 8.7* 8.8* 9.0 9.1  MG  --  2.3  --   --   --    GFR: Estimated Creatinine Clearance: 19.1 mL/min (A) (by C-G formula based on SCr of 1.33 mg/dL (H)). Liver Function Tests: Recent Labs  Lab 11/26/20 1223 11/27/20 0418  AST 26 24  ALT 16 12  ALKPHOS 71 67  BILITOT 0.9 1.2  PROT 7.3 6.9  ALBUMIN 3.4* 3.1*   No results for input(s): LIPASE, AMYLASE in the last 168 hours. No results for input(s): AMMONIA in the last 168 hours. Coagulation Profile: Recent Labs  Lab 11/27/20 0418  INR 1.3*   Cardiac Enzymes: No results for input(s): CKTOTAL, CKMB, CKMBINDEX, TROPONINI in the last 168 hours. BNP (last 3 results) No results for input(s): PROBNP in the last 8760 hours. HbA1C: No results for input(s): HGBA1C in the last 72 hours. CBG: Recent Labs  Lab 11/26/20 1159  GLUCAP 139*   Lipid Profile: No results for input(s): CHOL, HDL, LDLCALC, TRIG, CHOLHDL, LDLDIRECT in the last 72 hours. Thyroid Function Tests: No results for input(s): TSH, T4TOTAL, FREET4, T3FREE, THYROIDAB in the last 72 hours. Anemia Panel: No results for input(s): VITAMINB12, FOLATE, FERRITIN, TIBC, IRON, RETICCTPCT in the last 72 hours. Sepsis  Labs: Recent Labs  Lab 11/26/20 1708 11/27/20 0418 11/28/20 0534 11/29/20 0501  PROCALCITON  --  1.89 0.90 0.47  LATICACIDVEN 1.5  --   --   --     Recent Results (from the past 240 hour(s))  Resp Panel by RT-PCR (Flu A&B, Covid) Nasopharyngeal Swab     Status: None   Collection Time: 11/26/20  4:40 PM   Specimen: Nasopharyngeal Swab; Nasopharyngeal(NP) swabs in vial transport medium  Result Value Ref Range Status   SARS Coronavirus 2 by RT PCR NEGATIVE NEGATIVE Final    Comment: (NOTE) SARS-CoV-2 target nucleic acids are NOT DETECTED.  The SARS-CoV-2 RNA is generally detectable in upper respiratory specimens during the acute phase of infection. The lowest concentration of SARS-CoV-2 viral copies this assay can detect is 138 copies/mL. A negative result does not preclude SARS-Cov-2 infection and should not be used as the sole basis for treatment or other patient management decisions. A negative result may occur with  improper specimen collection/handling, submission of specimen other than nasopharyngeal swab, presence of viral mutation(s) within the areas targeted by this assay, and inadequate number of viral copies(<138 copies/mL).  A negative result must be combined with clinical observations, patient history, and epidemiological information. The expected result is Negative.  Fact Sheet for Patients:  EntrepreneurPulse.com.au  Fact Sheet for Healthcare Providers:  IncredibleEmployment.be  This test is no t yet approved or cleared by the [place] FDA and  has been authorized for detection and/or diagnosis of SARS-CoV-2 by FDA under an Emergency Use Authorization (EUA). This EUA will remain  in effect (meaning this test can be used) for the duration of the COVID-19 declaration under Section 564(b)(1) of the Act, 21 U.S.C.section 360bbb-3(b)(1), unless the authorization is terminated  or revoked sooner.       Influenza A by PCR  NEGATIVE NEGATIVE Final   Influenza B by PCR NEGATIVE NEGATIVE Final    Comment: (NOTE) The Xpert Xpress SARS-CoV-2/FLU/RSV plus assay is intended as an aid in the diagnosis of influenza from Nasopharyngeal swab specimens and should not be used as a sole basis for treatment. Nasal washings and aspirates are unacceptable for Xpert Xpress SARS-CoV-2/FLU/RSV testing.  Fact Sheet for Patients: EntrepreneurPulse.com.au  Fact Sheet for Healthcare Providers: IncredibleEmployment.be  This test is not yet approved or cleared by the [place] FDA and has been authorized for detection and/or diagnosis of SARS-CoV-2 by FDA under an Emergency Use Authorization (EUA). This EUA will remain in effect (meaning this test can be used) for the duration of the COVID-19 declaration under Section 564(b)(1) of the Act, 21 U.S.C. section 360bbb-3(b)(1), unless the authorization is terminated or revoked.  Performed at [facility], [place] [address], [place], [place] [address]          Radiology Studies: NM Hepatobiliary Liver Func  Result Date: 12/01/2020 CLINICAL DATA:  Concern for acalculous cholecystitis. Elevated bilirubin EXAM: NUCLEAR MEDICINE HEPATOBILIARY IMAGING TECHNIQUE: Sequential images of the abdomen were obtained out to 60 minutes following intravenous administration of radiopharmaceutical. RADIOPHARMACEUTICALS:  5.4 mCi Tc-66m Choletec IV COMPARISON:  MRI 11/26/2020 100 CT 1822 FINDINGS: Prompt clearance radiotracer from blood pool and homogeneous uptake in liver. Counts are evident in the common bile duct by 10 minutes. Counts are present the small bowel by 20 minutes. Gallbladder fails to fill at 50 minutes. 2.0 mg IV morphine was administered to augment filling of the gallbladder. No filling of the gallbladder after morphine administration. IMPRESSION: 1. Non filling of the gallbladder consistent with acute cholecystitis. 2. Patent  common bile duct. These results will be called to the ordering clinician or representative by the Radiologist Assistant, and communication documented in the PACS or [employer] Electronically Signed   By: SSuzy BouchardM.D.   On: 12/01/2020 10:32        Scheduled Meds:  amoxicillin-clavulanate  1 tablet Oral BID   apixaban  2.5 mg Oral BID   atorvastatin  10 mg Oral QHS   diltiazem  30 mg Oral TID   feeding supplement  237 mL Oral BID BM   levothyroxine  100 mcg Oral Daily   metoprolol tartrate  100 mg Oral BID   mirabegron ER  50 mg Oral QHS   multivitamin with minerals  1 tablet Oral Daily   ofloxacin  1 drop Both Eyes QID   vitamin B-12  1,000 mcg Oral Daily   Continuous Infusions:     LOS: 5 days    Time spent: 39 minutes spent on chart review, discussion with nursing staff, consultants, updating family and interview/physical exam; more than 50% of that time was spent in counseling and/or coordination of care.  Shandie Bertz J [place] DO [facility] Available via Epic secure chat 7am-7pm After these hours, please refer to coverage provider listed on [email or web address] 12/01/2020, 3:51 PM

## 2020-12-02 LAB — BASIC METABOLIC PANEL
Anion gap: 9 (ref 5–15)
BUN: 22 mg/dL (ref 8–23)
CO2: 27 mmol/L (ref 22–32)
Calcium: 9.4 mg/dL (ref 8.9–10.3)
Chloride: 101 mmol/L (ref 98–111)
Creatinine, Ser: 1.18 mg/dL — ABNORMAL HIGH (ref 0.44–1.00)
GFR, Estimated: 42 mL/min — ABNORMAL LOW (ref >60)
Glucose, Bld: 81 mg/dL (ref 70–99)
Potassium: 4.6 mmol/L (ref 3.5–5.1)
Sodium: 137 mmol/L (ref 135–145)

## 2020-12-02 NOTE — Progress Notes (Signed)
Physical Therapy Treatment Patient Details Name: DELIMAR SISEMORE MRN: [id number] DOB: Jun 28, 1920 Today's Date: 12/02/2020    History of Present Illness Patient is a 87 year old female admitted with acute cholecystitis. Hx of falls, dizziness, syncope, Afib, CAD, CHF, CKD    PT Comments    The patient  was assisted to [facility], able to stand and assist with post voiding with 1 HHA. Ambulated with min/mod support for turns x 20'. Using RW. Will ask mobility to assist with ambulation.  Follow Up Recommendations  SNF     Equipment Recommendations  None recommended by PT    Recommendations for Other Services       Precautions / Restrictions Precautions Precautions: Fall Precaution Comments: incontinent    Mobility  Bed Mobility               General bed mobility comments: in recliner    Transfers Overall transfer level: Needs assistance Equipment used: Rolling walker (2 wheeled) Transfers: Sit to/from [facility] Sit to Stand: Min assist Stand pivot transfers: Min assist       General transfer comment: Assist to rise, steady, steps to [facility] then to recliner with  min assist.Pt. incontinent of urine  Ambulation/Gait Ambulation/Gait assistance: Min assist Gait Distance (Feet): 20 Feet Assistive device: Rolling walker (2 wheeled) Gait Pattern/deviations: Step-through pattern;Decreased stride length Gait velocity: decr   General Gait Details: Steady assistance, no buckling this day. Patient  tolerated well.   Stairs             Wheelchair Mobility    Modified Rankin (Stroke Patients Only)       Balance Overall balance assessment: History of Falls;Needs assistance Sitting-balance support: No upper extremity supported;Feet supported Sitting balance-Leahy Scale: Fair     Standing balance support: Bilateral upper extremity supported Standing balance-Leahy Scale: Fair Standing balance comment: stood and performed pericare standing 1 HHa to  steady.                            Cognition Arousal/Alertness: Awake/alert   Overall Cognitive Status: No family/caregiver present to determine baseline cognitive functioning                                 General Comments: seems mostly WFL but pt does repeated herself-STM deficits, oriented to august and [facility] and she lives in a rehab      Exercises      General Comments        Pertinent Vitals/Pain Pain Assessment: No/denies pain    Home Living                      Prior Function            PT Goals (current goals can now be found in the care plan section) Progress towards PT goals: Progressing toward goals    Frequency    Min 2X/week      PT Plan Current plan remains appropriate;Frequency needs to be updated    Co-evaluation              AM-PAC PT "6 Clicks" Mobility   Outcome Measure  Help needed turning from your back to your side while in a flat bed without using bedrails?: A Little Help needed moving from lying on your back to sitting on the side of a flat bed without using bedrails?:  A Little Help needed moving to and from a bed to a chair (including a wheelchair)?: A Little Help needed standing up from a chair using your arms (e.g., wheelchair or bedside chair)?: A Little Help needed to walk in hospital room?: A Little Help needed climbing 3-5 steps with a railing? : Total 6 Click Score: 16    End of Session Equipment Utilized During Treatment: Gait belt Activity Tolerance: Patient tolerated treatment well Patient left: in chair;with call bell/phone within reach;with chair alarm set Nurse Communication: Mobility status PT Visit Diagnosis: Muscle weakness (generalized) (M62.81);Difficulty in walking, not elsewhere classified (R26.2)     Time: [id number] PT Time Calculation (min) (ACUTE ONLY): 20 min  Charges:  $Gait Training: 8-22 mins                     Tresa Endo PT Acute Rehabilitation  Services Pager [phone] Office [phone]    Claretha Cooper 12/02/2020, 3:28 PM

## 2020-12-02 NOTE — TOC Progression Note (Signed)
Transition of Care [facility]) - Progression Note    Patient Details  Name: [name] MRN: [id number] Date of Birth: April 27, 1920  Transition of [facility]) CM/SW Contact  Tasheika Kitzmiller, Marjie Skiff, RN Phone Number: 12/02/2020, 2:32 PM  Clinical Narrative:    SNF bed offers given to son [name] and [facility] chosen. [facility] from [facility] informed of bed choice. Still awaiting SNF auth from [facility]. [facility] MD has asked for PT to see pt again today prior to auth decision. TOC will continue to follow.   Expected Discharge Plan: Skilled Nursing Facility Barriers to Discharge: No Barriers Identified  Expected Discharge Plan and Services Expected Discharge Plan: [place]   Discharge Planning Services: CM Consult   Living arrangements for the past 2 months: [place] Expected Discharge Date: 12/02/20                    [facility] Arranged: PT     Social Determinants of Health (SDOH) Interventions    Readmission Risk Interventions Readmission Risk Prevention Plan 12/01/2020  Transportation Screening Complete  PCP or Specialist Appt within 3-5 Days Complete  HRI or [place] Complete  Social Work Consult for [place] Planning/Counseling Complete  Palliative Care Screening Not Applicable  Medication Review [employer]) Complete  Some recent data might be hidden

## 2020-12-02 NOTE — Discharge Summary (Addendum)
Physician Discharge Summary  [name] [id number] DOB: [birth year] DOA: 11/26/2020  PCP: Holland Commons, FNP  Admit date: 11/26/2020 Discharge date: 12/03/2020  Admitted From: ALF Disposition:  SNF  Recommendations for Outpatient Follow-up:  Follow up with PCP in 1 week Follow-up with [facility] as outpatient  Discharge Condition: Stable CODE STATUS: DNR  Diet recommendation:  Diet Orders (From admission, onward)     Start     Ordered   12/01/20 1008  Diet regular Room service appropriate? Yes; Fluid consistency: Thin  Diet effective now       Question Answer Comment  Room service appropriate? Yes   Fluid consistency: Thin      12/01/20 1007   12/01/20 0000  Diet - low sodium heart healthy        12/01/20 1144           Brief/Interim Summary: [name] is a 87 year old female with past medical history significant for paroxysmal atrial fibrillation on anticoagulation with Eliquis, CKD stage IIIa, chronic diastolic congestive heart failure, CAD, who presented from [facility] assisted living facility via EMS for near syncopal episode.  Patient does not recall event and staff reported patient did not fall and no loss of consciousness.  Patient has been reporting pain to the abdomen without nausea/vomiting/diarrhea.  Denies chest pain.   In the ED, her 91.1 F, HR 89, RR 16, BP 112/68, SPO2 97% on room air.  Sodium 133, potassium 4.2, chloride 96, CO2 29, BUN 33, creatinine 1.47, glucose 128.  AST 26, ALT 16, total bilirubin 0.9.  WBC 16.9, hemoglobin 14.7, platelets 165.  High sensitive troponin 9> 8.  Cova-19 PCR negative.  Influenza A/B PCR negative.  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  General surgery was consulted.  Patient was started on IV antibiotics.  TRH  consulted for further evaluation and management.  Patient underwent testing including MRCP and HIDA scan.  Ultimately, patient was treated with antibiotics.  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  Patient notable with elevated WBC count of 16.9 and procalcitonin 1.89.  LFTs within normal limits. 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  No blood cultures performed in the ED or on admission.  HIDA scan notable for no filling within the gallbladder but patent common bile duct.  Discussed with general surgery, Dr. Windle Guard who recommended to continue antibiotics to complete 10-day course.  Not a operative candidate and patient/family want to hold off on any aggressive measures such as percutaneous cholecystostomy at this point.  Near syncopal episode Etiology likely secondary to abdominal infection as above with likely acalculous cholecystitis.  No fall or loss of  consciousness.  TTE 02/11/2020 with LVEF 60 to 65%, no LV R WMA, mild LVH, no aortic valve stenosis.  Carotid duplex ultrasound 08/06/2020 with no significant flow restrictions bilateral carotid arteries.   AKI on CKD stage IIIa Baseline creatinine 1.05.  Creatinine on admission 1.47.  Suspect prerenal azotemia from abdominal infection as above.  Renal ultrasound, limited exam but grossly unremarkable kidneys.  Resolved.  Creatinine on discharge 1.18   Proximal SMA stenosis CT angiogram abdomen/pelvis notable for proximal SMA stenosis with patent celiac/IMA likely providing collateral circulation to the mesentery.  Likely chronic. --Continue atorvastatin    Left SFA origin stenosis CT angiogram abdomen/pelvis notable for left SFA stenosis.  Patient not complaining of claudication or leg pain.  Hold on obtaining ABIs for now given not optimal surgical candidate.   Bacterial conjunctivitis, OU Patient reports matted eyelids, slightly blurred vision and discharge from bilateral eyes with associated subconjunctival erythema. --Ocuflox eyedrops 4 times daily x7 days   Paroxysmal atrial fibrillation EKG on admission with atrial fibrillation, rate 61; no concerning ischemic findings.  High sensitive troponin within normal limits x2.  Chest x-ray with no acute cardiopulmonary disease process. -- Continue Eliquis, diltiazem, metoprolol   HLD: Atorvastatin    Hypothyroidism: Levothyroxine    Weakness/deconditioning/debility: Patient currently resides at [facility].  She has become deconditioned due to her illness as above; in which she was fully independent prior to hospitalization.  --PT/OT recommends SNF on discharge   Discharge Instructions  Discharge Instructions     Call MD for:  difficulty breathing, headache or visual disturbances   Complete by: As directed    Call MD for:  extreme fatigue   Complete by: As directed    Call MD for:  persistant dizziness or  light-headedness   Complete by: As directed    Call MD for:  persistant nausea and vomiting   Complete by: As directed    Call MD for:  severe uncontrolled pain   Complete by: As directed    Call MD for:  temperature >100.4   Complete by: As directed    Diet - low sodium heart healthy   Complete by: As directed    Increase activity slowly   Complete by: As directed    Increase activity slowly   Complete by: As directed       Allergies as of 12/02/2020   No Known Allergies      Medication List     TAKE these medications    acetaminophen 500 MG tablet Commonly known as: TYLENOL Take 1,000 mg by mouth every 8 (eight) hours as needed (for pain).   amoxicillin-clavulanate 500-125 MG tablet Commonly known as: AUGMENTIN Take 1 tablet (500 mg total) by mouth 2 (two) times daily for 5 days.   apixaban 2.5 MG Tabs tablet Commonly known as: ELIQUIS Take 1 tablet (2.5 mg total) by mouth 2 (two) times daily.   atorvastatin 10 MG tablet Commonly known as: LIPITOR Take 1 tablet (10 mg total) by mouth daily.   diltiazem 30 MG tablet Commonly known as: CARDIZEM Take 1 tablet (30 mg total) by mouth 3 (three) times daily. What changed:  medication strength how much to take   feeding supplement Liqd Take 237 mLs by mouth 3 (three) times daily between meals.   furosemide 20 MG tablet Commonly known as: LASIX Take 1 tablet (20  mg total) by mouth every Monday, Wednesday, and Friday. What changed:  how much to take when to take this additional instructions   levothyroxine 100 MCG tablet Commonly known as: SYNTHROID Take 100 mcg by mouth daily.   magnesium citrate Soln Take 296 mLs (1 Bottle total) by mouth daily as needed for severe constipation.   metoprolol tartrate 100 MG tablet Commonly known as: LOPRESSOR Take 100 mg by mouth 2 (two) times daily.   Myrbetriq 50 MG Tb24 tablet Generic drug: mirabegron ER Take 50 mg by mouth every evening.   nitroGLYCERIN 0.4 MG  SL tablet Commonly known as: NITROSTAT Place 0.4 mg under the tongue every 5 (five) minutes as needed. For chest pain   ofloxacin 0.3 % ophthalmic solution Commonly known as: OCUFLOX Place 1 drop into both eyes 4 (four) times daily for 4 days.   ONE-A-DAY PROACTIVE 65+ PO Take 1 tablet by mouth at bedtime.   OVER THE COUNTER MEDICATION 3 (three) times daily. MedPass 2.0   polyethylene glycol 17 g packet Commonly known as: MIRALAX / GLYCOLAX Take 17 g by mouth 2 (two) times daily as needed for mild constipation.   senna-docusate 8.6-50 MG tablet Commonly known as: Senokot-S Take 1 tablet by mouth 2 (two) times daily as needed for moderate constipation.   trimethoprim 100 MG tablet Commonly known as: TRIMPEX Take 100 mg by mouth in the morning.   vitamin B-12 1000 MCG tablet Commonly known as: CYANOCOBALAMIN Take 1,000 mcg by mouth daily.        Follow-up Information     Holland Commons, FNP. Schedule an appointment as soon as possible for a visit in 1 week(s).   Specialty: Internal Medicine Contact information: [address] [place] [address] [phone]         Surgery, [place]. Schedule an appointment as soon as possible for a visit in 1 week(s).   Specialty: General Surgery Contact information: [place] [address] [phone]                No Known Allergies  Consultations: General surgery Palliative care medicine   Procedures/Studies: NM Hepatobiliary Liver Func  Result Date: 12/01/2020 CLINICAL DATA:  Concern for acalculous cholecystitis. Elevated bilirubin EXAM: NUCLEAR MEDICINE HEPATOBILIARY IMAGING TECHNIQUE: Sequential images of the abdomen were obtained out to 60 minutes following intravenous administration of radiopharmaceutical. RADIOPHARMACEUTICALS:  5.4 mCi Tc-30m Choletec IV COMPARISON:  MRI 11/26/2020 100 CT 1822 FINDINGS: Prompt clearance radiotracer from blood pool  and homogeneous uptake in liver. Counts are evident in the common bile duct by 10 minutes. Counts are present the small bowel by 20 minutes. Gallbladder fails to fill at 50 minutes. 2.0 mg IV morphine was administered to augment filling of the gallbladder. No filling of the gallbladder after morphine administration. IMPRESSION: 1. Non filling of the gallbladder consistent with acute cholecystitis. 2. Patent common bile duct. These results will be called to the ordering clinician or representative by the Radiologist Assistant, and communication documented in the PACS or [employer] Electronically Signed   By: SSuzy BouchardM.D.   On: 12/01/2020 10:32   [place]NAL  Result Date: 11/26/2020 CLINICAL DATA:  Acute kidney injury. EXAM: RENAL / URINARY TRACT ULTRASOUND COMPLETE COMPARISON:  CT a abdomen 11/26/2020 FINDINGS: Markedly limited evaluation. Right Kidney: Renal measurements: 8.7 x 4.6 x 4.3 cm = volume: 90 mL. Echogenicity within normal limits. No mass or hydronephrosis visualized. Left Kidney: Renal measurements: 8.5 x  4.3 x 4.5 cm = volume: 85 mL. Echogenicity within normal limits. There is a 2.6 x 2.5 x 2.3 cm hypoechoic lesion with no associated mural nodularity, septation, vascularity no solid mass. No hydronephrosis visualized. Urinary bladder: Appears normal for degree of bladder distention. Other: None. IMPRESSION: Markedly limited evaluation with grossly unremarkable kidneys. Electronically Signed   By: Iven Finn M.D.   On: 11/26/2020 18:12   MR 3D Recon At Scanner  Result Date: 11/27/2020 CLINICAL DATA:  Cholecystitis. Question choledochocolonic fistula on CT scan. EXAM: MRI ABDOMEN WITHOUT AND WITH CONTRAST (INCLUDING MRCP) TECHNIQUE: Multiplanar multisequence MR imaging of the abdomen was performed both before and after the administration of intravenous contrast. Heavily T2-weighted images of the biliary and pancreatic ducts were obtained, and three-dimensional MRCP images were  rendered by post processing. CONTRAST:  29m GADAVIST GADOBUTROL 1 MMOL/ML IV SOLN COMPARISON:  CT 11/26/2020 and 05/26/2020. FINDINGS: Despite efforts by the technologist and patient, mild motion artifact is present on today's exam and could not be eliminated. This reduces exam sensitivity and specificity. Lower chest: Trace right pleural effusion. The visualized lower chest otherwise appears unremarkable. Hepatobiliary: Transient hypervascularity inferiorly in the left hepatic lobe again noted. No suspicious focal hepatic lesions are identified. There is mild extrahepatic biliary dilatation with the common hepatic duct measuring 10 mm in diameter. No evidence of choledocholithiasis. In correlation with previous CTs, there is gas within the gallbladder lumen which demonstrates diffuse wall thickening. There is no definite gas within the remainder of the biliary system. 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 Pancreas: Unremarkable. No pancreatic ductal dilatation or surrounding inflammatory changes. Spleen: Normal in size without focal abnormality. Adrenals/Urinary Tract: Both adrenal glands appear normal. Stable cyst in the upper pole of the left kidney. No evidence of enhancing renal mass or hydronephrosis. Stomach/Bowel: There is a periampullary duodenal diverticulum. 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 Vascular/Lymphatic: There are no enlarged abdominal lymph nodes. Aortic and branch vessel atherosclerosis with proximal SMA stenosis, better seen on recent CTA. Other: No evidence of abdominal wall hernia or ascites. Musculoskeletal: No acute or significant osseous findings. Multilevel spondylosis. IMPRESSION: 1. 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 As seen on CT, there is diffuse gallbladder wall  thickening which could reflect acalculous cholecystitis. 2. Mild extrahepatic biliary dilatation without evidence of choledocholithiasis. 3. 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 Aortic Atherosclerosis (ICD10-I70.0). Electronically Signed   By: WRichardean SaleM.D.   On: 11/27/2020 08:05   MR ABDOMEN MRCP W WO CONTAST  Result Date: 11/27/2020 CLINICAL DATA:  Cholecystitis. Question choledochocolonic fistula on CT scan. EXAM: MRI ABDOMEN WITHOUT AND WITH CONTRAST (INCLUDING MRCP) TECHNIQUE: Multiplanar multisequence MR imaging of the abdomen was performed both before and after the administration of intravenous contrast. Heavily T2-weighted images of the biliary and pancreatic ducts were obtained, and three-dimensional MRCP images were rendered by post processing. CONTRAST:  570mGADAVIST GADOBUTROL 1 MMOL/ML IV SOLN COMPARISON:  CT 11/26/2020 and 05/26/2020. FINDINGS: Despite efforts by the technologist and patient, mild motion artifact is present on today's exam and could not be eliminated. This reduces exam sensitivity and specificity. Lower chest: Trace right pleural effusion. The visualized lower chest otherwise appears unremarkable. Hepatobiliary: Transient hypervascularity inferiorly in the left hepatic lobe again noted. No suspicious focal hepatic lesions are identified. There is  mild extrahepatic biliary dilatation with the common hepatic duct measuring 10 mm in diameter. No evidence of choledocholithiasis. In correlation with previous CTs, there is gas within the gallbladder lumen which demonstrates diffuse wall thickening. There is no definite gas within the remainder of the biliary system. 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 Pancreas: Unremarkable. No pancreatic ductal dilatation or surrounding inflammatory changes. Spleen: Normal in size without focal abnormality. Adrenals/Urinary Tract: Both adrenal glands appear normal.  Stable cyst in the upper pole of the left kidney. No evidence of enhancing renal mass or hydronephrosis. Stomach/Bowel: There is a periampullary duodenal diverticulum. 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 Vascular/Lymphatic: There are no enlarged abdominal lymph nodes. Aortic and branch vessel atherosclerosis with proximal SMA stenosis, better seen on recent CTA. Other: No evidence of abdominal wall hernia or ascites. Musculoskeletal: No acute or significant osseous findings. Multilevel spondylosis. IMPRESSION: 1. 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 As seen on CT, there is diffuse gallbladder wall thickening which could reflect acalculous cholecystitis. 2. Mild extrahepatic biliary dilatation without evidence of choledocholithiasis. 3. 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 Aortic Atherosclerosis (ICD10-I70.0). Electronically Signed   By: Richardean Sale M.D.   On: 11/27/2020 08:05   CT Angio Abd/Pel W and/or Wo Contrast  Addendum Date: 11/26/2020   ADDENDUM REPORT: 11/26/2020 17:27 ADDENDUM: There is a typographical error in the IMPRESSION, #3 should read as follows: 3. 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 Electronically Signed   By: Lucrezia Europe M.D.   On: 11/26/2020 17:27   Result Date: 11/26/2020 CLINICAL DATA:  Near syncope, acute mesenteric ischemia EXAM: CTA ABDOMEN AND PELVIS WITH CONTRAST TECHNIQUE: Multidetector CT imaging of the abdomen and pelvis was performed using the standard protocol during bolus administration of intravenous contrast. Multiplanar reconstructed images and MIPs were obtained and reviewed to evaluate the vascular anatomy. CONTRAST:  39m OMNIPAQUE IOHEXOL 350 MG/ML SOLN COMPARISON:  05/26/2020 FINDINGS: VASCULAR Coronary  calcifications. At least mild cardiomegaly with right atrial enlargement. Aorta: Extensive calcified atheromatous plaque. No aneurysm, dissection, or stenosis. Celiac: Calcified ostial plaque resulting in short segment mild stenosis, patent distally. SMA: Partially calcified plaque extending from the origin approximately 1.7 cm resulting in segmental stenosis of at least moderate severity, the distal vessel mildly atheromatous but patent with classic branch anatomy. Renals: Single left, with calcified ostial plaque over length of approximately 1 cm resulting in at least mild stenosis, patent distally. Single right, with calcified ostial plaque resulting in mild short-segment stenosis, patent distally. IMA: Patent, with short-segment origin stenosis related to aortic wall plaque, and atheromatous irregularity of indeterminate hemodynamic significance approximately 2 cm distally, with patent distal branch anatomy. Inflow: Moderate calcified plaque without high-grade stenosis. Mild tortuosity. Proximal Outflow: Calcified plaque at the left SFA origin resulting in stenosis of at least mild severity, atheromatous but patent in the visualized distal portion. Veins: Patent hepatic veins, portal vein, SVC, splenic vein, bilateral renal veins, iliac confluence and IVC. Review of the MIP images confirms the above findings. NON-VASCULAR Lower chest: No pleural or pericardial effusion. Hepatobiliary: Transient hepatic attenuation difference in arterial phase scanning, with no discrete lesion evident on venous phase. Calcified subcapsular lesion in segment 7 stable since 02/14/2012. There is a tubular subhepatic process which appears confluent with  the hepatic flexure of the colon and has been consistently present on prior studies, containing some gas and fluid , suggestive of gallbladder with cholecystocolonic fistula. There is new marked thickening of the wall of the presumed gallbladder, and mucosal enhancement. No calcified  gallstones. Pancreas: Unremarkable. No pancreatic ductal dilatation or surrounding inflammatory changes. Spleen: Normal in size without focal abnormality. Adrenals/Urinary Tract: Adrenal glands unremarkable. 2.2 cm probable cyst, upper pole left kidney, stable since 02/10/2020. No hydronephrosis. Urinary bladder is physiologically distended. Stomach/Bowel: Stomach is incompletely distended. Duodenal diverticulum. The small bowel is decompressed. Appendix not identified. 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 Apparent patulous segment of colon anteriorly at the level of the aortic bifurcation which is stable since 05/26/2020. Innumerable distal descending and sigmoid diverticula without regional inflammatory change. Lymphatic: No abdominal or pelvic adenopathy. Reproductive: Status post hysterectomy. No adnexal masses. Other: Bilateral pelvic phleboliths.  No ascites.  No   free air. Musculoskeletal: Multilevel spondylitic changes in the lumbar spine. Osteitis pubis. No acute fracture or worrisome bone lesion. IMPRESSION: 1. 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 Patent celiac axis and inferior mesenteric artery may provide adequate collateral circulation to the mesentery. 2. Left SFA origin stenosis of possible hemodynamic significance. Correlate with clinical symptomatology and ABIs. 3. 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 4. Descending and sigmoid diverticulosis. Electronically Signed: By: Lucrezia Europe M.D. On: 11/26/2020 15:13       Discharge Exam: Vitals:   12/02/20 0623 12/02/20 0949  BP: 140/90 (!) 131/99  Pulse: 74 94  Resp: 17 18  Temp: 98.3 F (36.8 C)   SpO2: 98% 100%    General: Pt is alert, awake, not in acute distress Cardiovascular: RRR, S1/S2 +, no edema Respiratory: CTA bilaterally, no wheezing, no rhonchi, no respiratory  distress Abdominal: Soft, NT, ND, bowel sounds + Extremities: no edema, no cyanosis Psych: Normal mood and affect   The results of significant diagnostics from this hospitalization (including imaging, microbiology, ancillary and laboratory) are listed below for reference.     Microbiology: Recent Results (from the past 240 hour(s))  Resp Panel by RT-PCR (Flu A&B, Covid) Nasopharyngeal Swab     Status: None   Collection Time: 11/26/20  4:40 PM   Specimen: Nasopharyngeal Swab; Nasopharyngeal(NP) swabs in vial transport medium  Result Value Ref Range Status   SARS Coronavirus 2 by RT PCR NEGATIVE NEGATIVE Final    Comment: (NOTE) SARS-CoV-2 target nucleic acids are NOT DETECTED.  The SARS-CoV-2 RNA is generally detectable in upper respiratory specimens during the acute phase of infection. The lowest concentration of SARS-CoV-2 viral copies this assay can detect is 138 copies/mL. A negative result does not preclude SARS-Cov-2 infection and should not be used as the sole basis for treatment or other patient management decisions. A negative result may occur with  improper specimen collection/handling, submission of specimen other than nasopharyngeal swab, presence of viral mutation(s) within the areas targeted by this assay, and inadequate number of viral copies(<138 copies/mL). A negative result must be combined with clinical observations, patient history, and epidemiological information. The expected result is Negative.  Fact Sheet for Patients:  EntrepreneurPulse.com.au  Fact Sheet for Healthcare Providers:  IncredibleEmployment.be  This test is no t yet approved or cleared by the [place] FDA and  has been authorized for detection and/or diagnosis of SARS-CoV-2 by FDA under an Emergency Use Authorization (EUA). This EUA will remain  in effect (meaning this test can be used)  for the duration of the COVID-19 declaration under Section  564(b)(1) of the Act, 21 U.S.C.section 360bbb-3(b)(1), unless the authorization is terminated  or revoked sooner.       Influenza A by PCR NEGATIVE NEGATIVE Final   Influenza B by PCR NEGATIVE NEGATIVE Final    Comment: (NOTE) The Xpert Xpress SARS-CoV-2/FLU/RSV plus assay is intended as an aid in the diagnosis of influenza from Nasopharyngeal swab specimens and should not be used as a sole basis for treatment. Nasal washings and aspirates are unacceptable for Xpert Xpress SARS-CoV-2/FLU/RSV testing.  Fact Sheet for Patients: EntrepreneurPulse.com.au  Fact Sheet for Healthcare Providers: IncredibleEmployment.be  This test is not yet approved or cleared by the [place] FDA and has been authorized for detection and/or diagnosis of SARS-CoV-2 by FDA under an Emergency Use Authorization (EUA). This EUA will remain in effect (meaning this test can be used) for the duration of the COVID-19 declaration under Section 564(b)(1) of the Act, 21 U.S.C. section 360bbb-3(b)(1), unless the authorization is terminated or revoked.  Performed at [facility], [place] [address], [place], [place] [address]      Labs: BNP (last 3 results) Recent Labs    02/10/20 1836  BNP [id number]*   Basic Metabolic Panel: Recent Labs  Lab 11/28/20 0534 11/29/20 0501 11/30/20 0503 12/01/20 0512 12/02/20 0548  NA 134* 135 137 137 137  K 4.1 4.3 4.3 4.6 4.6  CL 97* 100 103 102 101  CO2 '26 27 28 28 27  '$ GLUCOSE 92 87 91 95 81  BUN 28* 24* 20 27* 22  CREATININE 1.41* 1.45* 1.26* 1.33* 1.18*  CALCIUM 8.7* 8.8* 9.0 9.1 9.4  MG 2.3  --   --   --   --    Liver Function Tests: Recent Labs  Lab 11/26/20 1223 11/27/20 0418  AST 26 24  ALT 16 12  ALKPHOS 71 67  BILITOT 0.9 1.2  PROT 7.3 6.9  ALBUMIN 3.4* 3.1*   No results for input(s): LIPASE, AMYLASE in the last 168 hours. No results for input(s): AMMONIA in the last 168 hours. CBC: Recent  Labs  Lab 11/26/20 1223 11/27/20 0418 11/28/20 0534 11/29/20 0501 11/30/20 0503 12/01/20 0512  WBC 16.9* 13.6* 7.5 7.3 6.8 8.0  NEUTROABS 14.0*  --   --   --   --   --   HGB 14.7 14.7 14.4 14.5 14.6 14.1  HCT 44.8 44.9 44.6 45.2 45.5 44.5  MCV 98.2 98.0 98.0 98.7 99.1 100.5*  PLT 165 178 182 197 188 204   Cardiac Enzymes: No results for input(s): CKTOTAL, CKMB, CKMBINDEX, TROPONINI in the last 168 hours. BNP: Invalid input(s): POCBNP CBG: Recent Labs  Lab 11/26/20 1159  GLUCAP 139*   D-Dimer No results for input(s): DDIMER in the last 72 hours. Hgb A1c No results for input(s): HGBA1C in the last 72 hours. Lipid Profile No results for input(s): CHOL, HDL, LDLCALC, TRIG, CHOLHDL, LDLDIRECT in the last 72 hours. Thyroid function studies No results for input(s): TSH, T4TOTAL, T3FREE, THYROIDAB in the last 72 hours.  Invalid input(s): FREET3 Anemia work up No results for input(s): VITAMINB12, FOLATE, FERRITIN, TIBC, IRON, RETICCTPCT in the last 72 hours. Urinalysis    Component Value Date/Time   COLORURINE YELLOW 08/07/2020 [place] 08/07/2020 0614   LABSPEC 1.020 08/07/2020 0614   PHURINE 5.0 08/07/2020 [place] 08/07/2020 0614   HGBUR NEGATIVE 08/07/2020 [place] 08/07/2020 0614   BILIRUBINUR negative  02/10/2020 1608   [place] 08/07/2020 0614   PROTEINUR NEGATIVE 08/07/2020 0614   UROBILINOGEN 1.0 02/10/2020 1608   UROBILINOGEN 0.2 02/13/2012 0221   NITRITE NEGATIVE 08/07/2020 0614   LEUKOCYTESUR NEGATIVE 08/07/2020 [id number]   Sepsis Labs Invalid input(s): PROCALCITONIN,  WBC,  LACTICIDVEN Microbiology Recent Results (from the past 240 hour(s))  Resp Panel by RT-PCR (Flu A&B, Covid) Nasopharyngeal Swab     Status: None   Collection Time: 11/26/20  4:40 PM   Specimen: Nasopharyngeal Swab; Nasopharyngeal(NP) swabs in vial transport medium  Result Value Ref Range Status   SARS Coronavirus 2 by RT PCR  NEGATIVE NEGATIVE Final    Comment: (NOTE) SARS-CoV-2 target nucleic acids are NOT DETECTED.  The SARS-CoV-2 RNA is generally detectable in upper respiratory specimens during the acute phase of infection. The lowest concentration of SARS-CoV-2 viral copies this assay can detect is 138 copies/mL. A negative result does not preclude SARS-Cov-2 infection and should not be used as the sole basis for treatment or other patient management decisions. A negative result may occur with  improper specimen collection/handling, submission of specimen other than nasopharyngeal swab, presence of viral mutation(s) within the areas targeted by this assay, and inadequate number of viral copies(<138 copies/mL). A negative result must be combined with clinical observations, patient history, and epidemiological information. The expected result is Negative.  Fact Sheet for Patients:  EntrepreneurPulse.com.au  Fact Sheet for Healthcare Providers:  IncredibleEmployment.be  This test is no t yet approved or cleared by the [place] FDA and  has been authorized for detection and/or diagnosis of SARS-CoV-2 by FDA under an Emergency Use Authorization (EUA). This EUA will remain  in effect (meaning this test can be used) for the duration of the COVID-19 declaration under Section 564(b)(1) of the Act, 21 U.S.C.section 360bbb-3(b)(1), unless the authorization is terminated  or revoked sooner.       Influenza A by PCR NEGATIVE NEGATIVE Final   Influenza B by PCR NEGATIVE NEGATIVE Final    Comment: (NOTE) The Xpert Xpress SARS-CoV-2/FLU/RSV plus assay is intended as an aid in the diagnosis of influenza from Nasopharyngeal swab specimens and should not be used as a sole basis for treatment. Nasal washings and aspirates are unacceptable for Xpert Xpress SARS-CoV-2/FLU/RSV testing.  Fact Sheet for Patients: EntrepreneurPulse.com.au  Fact Sheet for  Healthcare Providers: IncredibleEmployment.be  This test is not yet approved or cleared by the [place] FDA and has been authorized for detection and/or diagnosis of SARS-CoV-2 by FDA under an Emergency Use Authorization (EUA). This EUA will remain in effect (meaning this test can be used) for the duration of the COVID-19 declaration under Section 564(b)(1) of the Act, 21 U.S.C. section 360bbb-3(b)(1), unless the authorization is terminated or revoked.  Performed at [facility], [place] [address], [place], [place] [address]      Patient was seen and examined on the day of discharge and was found to be in stable condition. Time coordinating discharge: 35 minutes including assessment and coordination of care, as well as examination of the patient.   SIGNED:  Dessa Phi, DO [facility] 12/02/2020, 11:04 AM

## 2020-12-03 DIAGNOSIS — H8113 Benign paroxysmal vertigo, bilateral: Secondary | ICD-10-CM | POA: Diagnosis not present

## 2020-12-03 DIAGNOSIS — M6281 Muscle weakness (generalized): Secondary | ICD-10-CM | POA: Diagnosis not present

## 2020-12-03 DIAGNOSIS — I2510 Atherosclerotic heart disease of native coronary artery without angina pectoris: Secondary | ICD-10-CM | POA: Diagnosis not present

## 2020-12-03 DIAGNOSIS — R498 Other voice and resonance disorders: Secondary | ICD-10-CM | POA: Diagnosis not present

## 2020-12-03 DIAGNOSIS — R55 Syncope and collapse: Secondary | ICD-10-CM | POA: Diagnosis not present

## 2020-12-03 DIAGNOSIS — R1312 Dysphagia, oropharyngeal phase: Secondary | ICD-10-CM | POA: Diagnosis not present

## 2020-12-03 DIAGNOSIS — K810 Acute cholecystitis: Secondary | ICD-10-CM | POA: Diagnosis not present

## 2020-12-03 DIAGNOSIS — I10 Essential (primary) hypertension: Secondary | ICD-10-CM | POA: Diagnosis not present

## 2020-12-03 DIAGNOSIS — R2689 Other abnormalities of gait and mobility: Secondary | ICD-10-CM | POA: Diagnosis not present

## 2020-12-03 DIAGNOSIS — Z86718 Personal history of other venous thrombosis and embolism: Secondary | ICD-10-CM | POA: Diagnosis not present

## 2020-12-03 DIAGNOSIS — R2681 Unsteadiness on feet: Secondary | ICD-10-CM | POA: Diagnosis not present

## 2020-12-03 DIAGNOSIS — J984 Other disorders of lung: Secondary | ICD-10-CM | POA: Diagnosis not present

## 2020-12-03 DIAGNOSIS — H8110 Benign paroxysmal vertigo, unspecified ear: Secondary | ICD-10-CM | POA: Diagnosis not present

## 2020-12-03 DIAGNOSIS — R296 Repeated falls: Secondary | ICD-10-CM | POA: Diagnosis not present

## 2020-12-03 DIAGNOSIS — N179 Acute kidney failure, unspecified: Secondary | ICD-10-CM | POA: Diagnosis not present

## 2020-12-03 DIAGNOSIS — N183 Chronic kidney disease, stage 3 unspecified: Secondary | ICD-10-CM | POA: Diagnosis not present

## 2020-12-03 DIAGNOSIS — K551 Chronic vascular disorders of intestine: Secondary | ICD-10-CM | POA: Diagnosis not present

## 2020-12-03 DIAGNOSIS — I739 Peripheral vascular disease, unspecified: Secondary | ICD-10-CM | POA: Diagnosis not present

## 2020-12-03 DIAGNOSIS — E039 Hypothyroidism, unspecified: Secondary | ICD-10-CM | POA: Diagnosis not present

## 2020-12-03 DIAGNOSIS — I5032 Chronic diastolic (congestive) heart failure: Secondary | ICD-10-CM | POA: Diagnosis not present

## 2020-12-03 DIAGNOSIS — I4891 Unspecified atrial fibrillation: Secondary | ICD-10-CM | POA: Diagnosis not present

## 2020-12-03 DIAGNOSIS — R0602 Shortness of breath: Secondary | ICD-10-CM | POA: Diagnosis not present

## 2020-12-03 DIAGNOSIS — M6258 Muscle wasting and atrophy, not elsewhere classified, other site: Secondary | ICD-10-CM | POA: Diagnosis not present

## 2020-12-03 DIAGNOSIS — K819 Cholecystitis, unspecified: Secondary | ICD-10-CM | POA: Diagnosis not present

## 2020-12-03 DIAGNOSIS — R41841 Cognitive communication deficit: Secondary | ICD-10-CM | POA: Diagnosis not present

## 2020-12-03 LAB — BASIC METABOLIC PANEL
Anion gap: 8 (ref 5–15)
BUN: 25 mg/dL — ABNORMAL HIGH (ref 8–23)
CO2: 29 mmol/L (ref 22–32)
Calcium: 9.2 mg/dL (ref 8.9–10.3)
Chloride: 102 mmol/L (ref 98–111)
Creatinine, Ser: 1.23 mg/dL — ABNORMAL HIGH (ref 0.44–1.00)
GFR, Estimated: 40 mL/min — ABNORMAL LOW (ref >60)
Glucose, Bld: 89 mg/dL (ref 70–99)
Potassium: 4.7 mmol/L (ref 3.5–5.1)
Sodium: 139 mmol/L (ref 135–145)

## 2020-12-03 LAB — RESP PANEL BY RT-PCR (FLU A&B, COVID) ARPGX2
Influenza A by PCR: NEGATIVE
Influenza B by PCR: NEGATIVE
SARS Coronavirus 2 by RT PCR: NEGATIVE

## 2020-12-03 MED ORDER — COVID-19 MRNA VAC-TRIS(PFIZER) 30 MCG/0.3ML IM SUSP
0.3000 mL | Freq: Once | INTRAMUSCULAR | Status: AC
  Administered 2020-12-03: 0.3 mL via INTRAMUSCULAR
  Filled 2020-12-03: qty 0.3

## 2020-12-03 NOTE — Progress Notes (Signed)
Patient discharged to [facility] with son. Report called to [place] at [address]. Paper work sent with son to facility.

## 2020-12-03 NOTE — Progress Notes (Signed)
Occupational Therapy Treatment Patient Details Name: [name] MRN: [id number] DOB: 1920/10/12 Today's Date: 12/03/2020    History of present illness Patient is a 87 year old female admitted with acute cholecystitis. Hx of falls, dizziness, syncope, Afib, CAD, CHF, CKD   OT comments  Treatment focused on self care tasks and activity tolerance. Patinet able to ambulate to bathroom with RW and min guard and perform toileting with min assist and grooming at sink with min guard. Patient overall reports weakness with all movement and activity and fatigued after short ADL task. Continue to recommend short term rehab at discharge prior to return to independent living.   Follow Up Recommendations  SNF    Equipment Recommendations  None recommended by OT    Recommendations for Other Services      Precautions / Restrictions Precautions Precautions: Fall Precaution Comments: incontinent Restrictions Weight Bearing Restrictions: No       Mobility Bed Mobility Overal bed mobility: Needs Assistance Bed Mobility: Supine to Sit     Supine to sit: Supervision          Transfers Overall transfer level: Needs assistance Equipment used: Rolling walker (2 wheeled) Transfers: Sit to/from Stand Sit to Stand: Min guard         General transfer comment: Min guard to ambulate to bathroom, perform toilet transfer  and then to recliner. Appeared fatigue after ADL tasks.    Balance Overall balance assessment: Needs assistance Sitting-balance support: No upper extremity supported;Feet supported Sitting balance-Leahy Scale: Fair     Standing balance support: During functional activity Standing balance-Leahy Scale: Fair                             ADL either performed or assessed with clinical judgement   ADL Overall ADL's : Needs assistance/impaired     Grooming: Wash/dry face;Standing;Min guard Grooming Details (indicate cue type and reason): stood at sink to wash  face                 Toilet Transfer: Min guard;Regular Toilet;Grab bars;RW Armed forces technical officer Details (indicate cue type and reason): verbal cue to use grab bar Toileting- Clothing Manipulation and Hygiene: Minimal assistance;Sit to/from stand Toileting - Clothing Manipulation Details (indicate cue type and reason): min assist for clothing management. Patient able to perform pericare in seated position.             Vision Patient Visual Report: No change from baseline     Perception     Praxis      Cognition Arousal/Alertness: Awake/alert Behavior During Therapy: WFL for tasks assessed/performed Overall Cognitive Status: Within Functional Limits for tasks assessed                                          Exercises     Shoulder Instructions       General Comments      Pertinent Vitals/ Pain       Pain Assessment: No/denies pain  Home Living                                          Prior Functioning/Environment              Frequency  Min 2X/week  Progress Toward Goals  OT Goals(current goals can now be found in the care plan section)  Progress towards OT goals: Progressing toward goals  Acute Rehab OT Goals Patient Stated Goal: get stronger OT Goal Formulation: With patient Time For Goal Achievement: 12/15/20 Potential to Achieve Goals: Good  Plan Discharge plan remains appropriate    Co-evaluation                 AM-PAC OT "6 Clicks" Daily Activity     Outcome Measure   Help from another person eating meals?: A Little Help from another person taking care of personal grooming?: A Little Help from another person toileting, which includes using toliet, bedpan, or urinal?: A Little Help from another person bathing (including washing, rinsing, drying)?: A Little Help from another person to put on and taking off regular upper body clothing?: A Little Help from another person to put on and  taking off regular lower body clothing?: A Lot 6 Click Score: 17    End of Session Equipment Utilized During Treatment: Rolling walker  OT Visit Diagnosis: Unsteadiness on feet (R26.81);Muscle weakness (generalized) (M62.81)   Activity Tolerance Patient limited by fatigue   Patient Left in chair;with call bell/phone within reach;with chair alarm set   Nurse Communication Mobility status        Time: 0810-0829 OT Time Calculation (min): 19 min  Charges: OT General Charges $OT Visit: 1 Visit OT Treatments $Self Care/Home Management : 8-22 mins  Derl Barrow, OTR/L [place]  Office [phone] Pager: [place] 12/03/2020, 10:36 AM

## 2020-12-03 NOTE — Progress Notes (Signed)
Mobility Specialist - Progress Note    12/03/20 1117  Oxygen Therapy  SpO2 96 %  O2 Device Room Air  Mobility  Activity Ambulated to bathroom;Ambulated in room  Level of Assistance Contact guard assist, steadying assist  Assistive Device Front wheel walker  Distance Ambulated (ft) 20 ft  Mobility Ambulated with assistance in room  Mobility Response Tolerated well  Mobility performed by Mobility specialist  $Mobility charge 1 Mobility    Pre-mobility: 97% SpO2 During mobility: 95%  SpO2 Post-mobility: 65 HR, 97% SPO2  Upon entry pt stated needing to use bathroom. Pt ambulated from recliner to bathroom using RW. Once done, pt ambulated in room and stated feeling very weak in her legs. Pt was returned to recliner and encouraged to practice pursed breathing. O2 levels and HR was measured due to pt c/o of SOB and weakness, levels reported above. Pt was left in recliner with chair alarm on and call bell on lap. RN informed of session.  [facility] Specialist Acute Rehabilitation Services Phone: [phone] 12/03/20, 11:21 AM

## 2020-12-03 NOTE — Progress Notes (Signed)
  PROGRESS NOTE  Patient sitting in chair, eating breakfast.  Denies any abdominal pain or nausea or vomiting today.  Ready for discharge to SNF today.    Dessa Phi, DO [facility] 12/03/2020, 10:00 AM  Available via Epic secure chat 7am-7pm After these hours, please refer to coverage provider listed on [email or web address]

## 2020-12-03 NOTE — TOC Transition Note (Addendum)
Transition of Care [facility]) - CM/SW Discharge Note   Patient Details  Name: [name] MRN: [id number] Date of Birth: [birth year]  Transition [facility] CM/SW Contact:  Lynnell Catalan, RN Phone Number: 12/03/2020, 9:52 AM   Clinical Narrative:    Insurance auth received from [employer] for SNF. Approved for 7 days [id number]). She was also approved for ambulance transport [id number]). Pt to receive 2nd covid booster shot and rapid covid test prior to dc. Per son [name] someone from the family will transport her to [facility] room 103p.  RN to call report to [phone].   Final next level of care: Skilled Nursing Facility Barriers to Discharge: No Barriers Identified   Discharge Placement        Patient chooses bed at: [facility] Patient to be transferred to facility by: [facility]   Discharge Plan and Services   Discharge Planning Services: CM Consult            Readmission Risk Interventions Readmission Risk Prevention Plan 12/01/2020  Transportation Screening Complete  PCP or Specialist Appt within 3-5 Days Complete  HRI or Home Care Consult Complete  Social Work Consult for [place] Planning/Counseling Complete  Palliative Care Screening Not Applicable  Medication Review Press photographer) Complete  Some recent data might be hidden

## 2020-12-04 DIAGNOSIS — K551 Chronic vascular disorders of intestine: Secondary | ICD-10-CM | POA: Diagnosis not present

## 2020-12-04 DIAGNOSIS — K819 Cholecystitis, unspecified: Secondary | ICD-10-CM | POA: Diagnosis not present

## 2020-12-04 DIAGNOSIS — I739 Peripheral vascular disease, unspecified: Secondary | ICD-10-CM | POA: Diagnosis not present

## 2020-12-04 DIAGNOSIS — R55 Syncope and collapse: Secondary | ICD-10-CM | POA: Diagnosis not present

## 2020-12-05 DIAGNOSIS — I5032 Chronic diastolic (congestive) heart failure: Secondary | ICD-10-CM | POA: Diagnosis not present

## 2020-12-05 DIAGNOSIS — K819 Cholecystitis, unspecified: Secondary | ICD-10-CM | POA: Diagnosis not present

## 2020-12-05 DIAGNOSIS — H8113 Benign paroxysmal vertigo, bilateral: Secondary | ICD-10-CM | POA: Diagnosis not present

## 2020-12-05 DIAGNOSIS — I2510 Atherosclerotic heart disease of native coronary artery without angina pectoris: Secondary | ICD-10-CM | POA: Diagnosis not present

## 2020-12-05 DIAGNOSIS — J984 Other disorders of lung: Secondary | ICD-10-CM | POA: Diagnosis not present

## 2020-12-05 DIAGNOSIS — I4891 Unspecified atrial fibrillation: Secondary | ICD-10-CM | POA: Diagnosis not present

## 2020-12-05 DIAGNOSIS — E039 Hypothyroidism, unspecified: Secondary | ICD-10-CM | POA: Diagnosis not present

## 2020-12-05 DIAGNOSIS — M6281 Muscle weakness (generalized): Secondary | ICD-10-CM | POA: Diagnosis not present

## 2020-12-05 DIAGNOSIS — Z86718 Personal history of other venous thrombosis and embolism: Secondary | ICD-10-CM | POA: Diagnosis not present

## 2020-12-05 DIAGNOSIS — R296 Repeated falls: Secondary | ICD-10-CM | POA: Diagnosis not present

## 2020-12-05 DIAGNOSIS — R2681 Unsteadiness on feet: Secondary | ICD-10-CM | POA: Diagnosis not present

## 2020-12-05 DIAGNOSIS — I10 Essential (primary) hypertension: Secondary | ICD-10-CM | POA: Diagnosis not present

## 2020-12-07 DIAGNOSIS — H8113 Benign paroxysmal vertigo, bilateral: Secondary | ICD-10-CM | POA: Diagnosis not present

## 2020-12-07 DIAGNOSIS — M6281 Muscle weakness (generalized): Secondary | ICD-10-CM | POA: Diagnosis not present

## 2020-12-07 DIAGNOSIS — I5032 Chronic diastolic (congestive) heart failure: Secondary | ICD-10-CM | POA: Diagnosis not present

## 2020-12-07 DIAGNOSIS — I2510 Atherosclerotic heart disease of native coronary artery without angina pectoris: Secondary | ICD-10-CM | POA: Diagnosis not present

## 2020-12-07 DIAGNOSIS — I4891 Unspecified atrial fibrillation: Secondary | ICD-10-CM | POA: Diagnosis not present

## 2020-12-07 DIAGNOSIS — K819 Cholecystitis, unspecified: Secondary | ICD-10-CM | POA: Diagnosis not present

## 2020-12-07 DIAGNOSIS — R296 Repeated falls: Secondary | ICD-10-CM | POA: Diagnosis not present

## 2020-12-07 DIAGNOSIS — E039 Hypothyroidism, unspecified: Secondary | ICD-10-CM | POA: Diagnosis not present

## 2020-12-07 DIAGNOSIS — Z86718 Personal history of other venous thrombosis and embolism: Secondary | ICD-10-CM | POA: Diagnosis not present

## 2020-12-07 DIAGNOSIS — I10 Essential (primary) hypertension: Secondary | ICD-10-CM | POA: Diagnosis not present

## 2020-12-07 DIAGNOSIS — R2681 Unsteadiness on feet: Secondary | ICD-10-CM | POA: Diagnosis not present

## 2020-12-07 DIAGNOSIS — J984 Other disorders of lung: Secondary | ICD-10-CM | POA: Diagnosis not present

## 2020-12-12 DIAGNOSIS — K819 Cholecystitis, unspecified: Secondary | ICD-10-CM | POA: Diagnosis not present

## 2020-12-12 DIAGNOSIS — I5032 Chronic diastolic (congestive) heart failure: Secondary | ICD-10-CM | POA: Diagnosis not present

## 2020-12-12 DIAGNOSIS — R296 Repeated falls: Secondary | ICD-10-CM | POA: Diagnosis not present

## 2020-12-12 DIAGNOSIS — Z86718 Personal history of other venous thrombosis and embolism: Secondary | ICD-10-CM | POA: Diagnosis not present

## 2020-12-12 DIAGNOSIS — H8113 Benign paroxysmal vertigo, bilateral: Secondary | ICD-10-CM | POA: Diagnosis not present

## 2020-12-12 DIAGNOSIS — I4891 Unspecified atrial fibrillation: Secondary | ICD-10-CM | POA: Diagnosis not present

## 2020-12-12 DIAGNOSIS — I10 Essential (primary) hypertension: Secondary | ICD-10-CM | POA: Diagnosis not present

## 2020-12-12 DIAGNOSIS — E039 Hypothyroidism, unspecified: Secondary | ICD-10-CM | POA: Diagnosis not present

## 2020-12-12 DIAGNOSIS — I2510 Atherosclerotic heart disease of native coronary artery without angina pectoris: Secondary | ICD-10-CM | POA: Diagnosis not present

## 2020-12-12 DIAGNOSIS — M6281 Muscle weakness (generalized): Secondary | ICD-10-CM | POA: Diagnosis not present

## 2020-12-12 DIAGNOSIS — R2681 Unsteadiness on feet: Secondary | ICD-10-CM | POA: Diagnosis not present

## 2020-12-12 DIAGNOSIS — J984 Other disorders of lung: Secondary | ICD-10-CM | POA: Diagnosis not present

## 2020-12-16 DIAGNOSIS — I5032 Chronic diastolic (congestive) heart failure: Secondary | ICD-10-CM | POA: Diagnosis not present

## 2020-12-16 DIAGNOSIS — K551 Chronic vascular disorders of intestine: Secondary | ICD-10-CM | POA: Diagnosis not present

## 2020-12-16 DIAGNOSIS — K819 Cholecystitis, unspecified: Secondary | ICD-10-CM | POA: Diagnosis not present

## 2020-12-16 DIAGNOSIS — I739 Peripheral vascular disease, unspecified: Secondary | ICD-10-CM | POA: Diagnosis not present

## 2020-12-17 DIAGNOSIS — K819 Cholecystitis, unspecified: Secondary | ICD-10-CM | POA: Diagnosis not present

## 2020-12-17 DIAGNOSIS — M6281 Muscle weakness (generalized): Secondary | ICD-10-CM | POA: Diagnosis not present

## 2020-12-17 DIAGNOSIS — I2510 Atherosclerotic heart disease of native coronary artery without angina pectoris: Secondary | ICD-10-CM | POA: Diagnosis not present

## 2020-12-17 DIAGNOSIS — H8113 Benign paroxysmal vertigo, bilateral: Secondary | ICD-10-CM | POA: Diagnosis not present

## 2020-12-17 DIAGNOSIS — Z86718 Personal history of other venous thrombosis and embolism: Secondary | ICD-10-CM | POA: Diagnosis not present

## 2020-12-17 DIAGNOSIS — R296 Repeated falls: Secondary | ICD-10-CM | POA: Diagnosis not present

## 2020-12-17 DIAGNOSIS — I10 Essential (primary) hypertension: Secondary | ICD-10-CM | POA: Diagnosis not present

## 2020-12-17 DIAGNOSIS — E039 Hypothyroidism, unspecified: Secondary | ICD-10-CM | POA: Diagnosis not present

## 2020-12-17 DIAGNOSIS — J984 Other disorders of lung: Secondary | ICD-10-CM | POA: Diagnosis not present

## 2020-12-17 DIAGNOSIS — N183 Chronic kidney disease, stage 3 unspecified: Secondary | ICD-10-CM | POA: Diagnosis not present

## 2020-12-17 DIAGNOSIS — N179 Acute kidney failure, unspecified: Secondary | ICD-10-CM | POA: Diagnosis not present

## 2020-12-17 DIAGNOSIS — I5032 Chronic diastolic (congestive) heart failure: Secondary | ICD-10-CM | POA: Diagnosis not present

## 2020-12-17 DIAGNOSIS — I4891 Unspecified atrial fibrillation: Secondary | ICD-10-CM | POA: Diagnosis not present

## 2020-12-17 DIAGNOSIS — R2681 Unsteadiness on feet: Secondary | ICD-10-CM | POA: Diagnosis not present

## 2020-12-21 DIAGNOSIS — M6281 Muscle weakness (generalized): Secondary | ICD-10-CM | POA: Diagnosis not present

## 2020-12-21 DIAGNOSIS — E782 Mixed hyperlipidemia: Secondary | ICD-10-CM | POA: Diagnosis not present

## 2020-12-21 DIAGNOSIS — I4891 Unspecified atrial fibrillation: Secondary | ICD-10-CM | POA: Diagnosis not present

## 2020-12-21 DIAGNOSIS — E039 Hypothyroidism, unspecified: Secondary | ICD-10-CM | POA: Diagnosis not present

## 2020-12-21 DIAGNOSIS — N3281 Overactive bladder: Secondary | ICD-10-CM | POA: Diagnosis not present

## 2020-12-21 DIAGNOSIS — E860 Dehydration: Secondary | ICD-10-CM | POA: Diagnosis not present

## 2020-12-30 DIAGNOSIS — F4321 Adjustment disorder with depressed mood: Secondary | ICD-10-CM | POA: Diagnosis not present

## 2020-12-31 DIAGNOSIS — Z7901 Long term (current) use of anticoagulants: Secondary | ICD-10-CM | POA: Diagnosis not present

## 2020-12-31 DIAGNOSIS — I2510 Atherosclerotic heart disease of native coronary artery without angina pectoris: Secondary | ICD-10-CM | POA: Diagnosis not present

## 2020-12-31 DIAGNOSIS — I509 Heart failure, unspecified: Secondary | ICD-10-CM | POA: Diagnosis not present

## 2020-12-31 DIAGNOSIS — E039 Hypothyroidism, unspecified: Secondary | ICD-10-CM | POA: Diagnosis not present

## 2020-12-31 DIAGNOSIS — Z87440 Personal history of urinary (tract) infections: Secondary | ICD-10-CM | POA: Diagnosis not present

## 2020-12-31 DIAGNOSIS — K551 Chronic vascular disorders of intestine: Secondary | ICD-10-CM | POA: Diagnosis not present

## 2020-12-31 DIAGNOSIS — N183 Chronic kidney disease, stage 3 unspecified: Secondary | ICD-10-CM | POA: Diagnosis not present

## 2020-12-31 DIAGNOSIS — K589 Irritable bowel syndrome without diarrhea: Secondary | ICD-10-CM | POA: Diagnosis not present

## 2020-12-31 DIAGNOSIS — I130 Hypertensive heart and chronic kidney disease with heart failure and stage 1 through stage 4 chronic kidney disease, or unspecified chronic kidney disease: Secondary | ICD-10-CM | POA: Diagnosis not present

## 2020-12-31 DIAGNOSIS — K5790 Diverticulosis of intestine, part unspecified, without perforation or abscess without bleeding: Secondary | ICD-10-CM | POA: Diagnosis not present

## 2020-12-31 DIAGNOSIS — K810 Acute cholecystitis: Secondary | ICD-10-CM | POA: Diagnosis not present

## 2020-12-31 DIAGNOSIS — N3281 Overactive bladder: Secondary | ICD-10-CM | POA: Diagnosis not present

## 2020-12-31 DIAGNOSIS — N179 Acute kidney failure, unspecified: Secondary | ICD-10-CM | POA: Diagnosis not present

## 2020-12-31 DIAGNOSIS — I480 Paroxysmal atrial fibrillation: Secondary | ICD-10-CM | POA: Diagnosis not present

## 2020-12-31 DIAGNOSIS — K219 Gastro-esophageal reflux disease without esophagitis: Secondary | ICD-10-CM | POA: Diagnosis not present

## 2020-12-31 DIAGNOSIS — R1310 Dysphagia, unspecified: Secondary | ICD-10-CM | POA: Diagnosis not present

## 2020-12-31 DIAGNOSIS — E860 Dehydration: Secondary | ICD-10-CM | POA: Diagnosis not present

## 2020-12-31 DIAGNOSIS — Z86711 Personal history of pulmonary embolism: Secondary | ICD-10-CM | POA: Diagnosis not present

## 2020-12-31 DIAGNOSIS — I739 Peripheral vascular disease, unspecified: Secondary | ICD-10-CM | POA: Diagnosis not present

## 2021-01-01 ENCOUNTER — Inpatient Hospital Stay (HOSPITAL_COMMUNITY)
Admission: EM | Admit: 2021-01-01 | Discharge: 2021-01-06 | DRG: 481 | Disposition: A | Discharge status: Skilled Nursing Facility | Payer: PPO | Source: Skilled Nursing Facility | Attending: Family Medicine | Admitting: Family Medicine

## 2021-01-01 ENCOUNTER — Emergency Department (HOSPITAL_COMMUNITY): Payer: PPO

## 2021-01-01 ENCOUNTER — Encounter (HOSPITAL_COMMUNITY): Payer: Self-pay | Admitting: Emergency Medicine

## 2021-01-01 ENCOUNTER — Encounter: Payer: Self-pay | Admitting: Internal Medicine

## 2021-01-01 ENCOUNTER — Other Ambulatory Visit: Payer: Self-pay

## 2021-01-01 DIAGNOSIS — R52 Pain, unspecified: Secondary | ICD-10-CM

## 2021-01-01 DIAGNOSIS — I2510 Atherosclerotic heart disease of native coronary artery without angina pectoris: Secondary | ICD-10-CM | POA: Diagnosis present

## 2021-01-01 DIAGNOSIS — I10 Essential (primary) hypertension: Secondary | ICD-10-CM | POA: Insufficient documentation

## 2021-01-01 DIAGNOSIS — E039 Hypothyroidism, unspecified: Secondary | ICD-10-CM | POA: Insufficient documentation

## 2021-01-01 DIAGNOSIS — T1490XA Injury, unspecified, initial encounter: Secondary | ICD-10-CM

## 2021-01-01 DIAGNOSIS — I480 Paroxysmal atrial fibrillation: Secondary | ICD-10-CM | POA: Insufficient documentation

## 2021-01-01 DIAGNOSIS — W19XXXA Unspecified fall, initial encounter: Secondary | ICD-10-CM

## 2021-01-01 DIAGNOSIS — S72002A Fracture of unspecified part of neck of left femur, initial encounter for closed fracture: Secondary | ICD-10-CM

## 2021-01-01 DIAGNOSIS — S72009A Fracture of unspecified part of neck of unspecified femur, initial encounter for closed fracture: Secondary | ICD-10-CM | POA: Diagnosis present

## 2021-01-01 DIAGNOSIS — E785 Hyperlipidemia, unspecified: Secondary | ICD-10-CM | POA: Insufficient documentation

## 2021-01-01 DIAGNOSIS — R2689 Other abnormalities of gait and mobility: Secondary | ICD-10-CM | POA: Diagnosis not present

## 2021-01-01 DIAGNOSIS — J449 Chronic obstructive pulmonary disease, unspecified: Secondary | ICD-10-CM | POA: Diagnosis present

## 2021-01-01 DIAGNOSIS — H9190 Unspecified hearing loss, unspecified ear: Secondary | ICD-10-CM | POA: Diagnosis present

## 2021-01-01 DIAGNOSIS — I5032 Chronic diastolic (congestive) heart failure: Secondary | ICD-10-CM | POA: Diagnosis not present

## 2021-01-01 DIAGNOSIS — Z743 Need for continuous supervision: Secondary | ICD-10-CM | POA: Diagnosis not present

## 2021-01-01 DIAGNOSIS — Z79899 Other long term (current) drug therapy: Secondary | ICD-10-CM

## 2021-01-01 DIAGNOSIS — W010XXA Fall on same level from slipping, tripping and stumbling without subsequent striking against object, initial encounter: Secondary | ICD-10-CM | POA: Diagnosis present

## 2021-01-01 DIAGNOSIS — S7700XA Crushing injury of unspecified hip, initial encounter: Secondary | ICD-10-CM | POA: Diagnosis not present

## 2021-01-01 DIAGNOSIS — I130 Hypertensive heart and chronic kidney disease with heart failure and stage 1 through stage 4 chronic kidney disease, or unspecified chronic kidney disease: Secondary | ICD-10-CM | POA: Diagnosis present

## 2021-01-01 DIAGNOSIS — R279 Unspecified lack of coordination: Secondary | ICD-10-CM | POA: Diagnosis not present

## 2021-01-01 DIAGNOSIS — I739 Peripheral vascular disease, unspecified: Secondary | ICD-10-CM | POA: Diagnosis not present

## 2021-01-01 DIAGNOSIS — I6529 Occlusion and stenosis of unspecified carotid artery: Secondary | ICD-10-CM | POA: Diagnosis not present

## 2021-01-01 DIAGNOSIS — R64 Cachexia: Secondary | ICD-10-CM | POA: Diagnosis not present

## 2021-01-01 DIAGNOSIS — S72002D Fracture of unspecified part of neck of left femur, subsequent encounter for closed fracture with routine healing: Secondary | ICD-10-CM | POA: Diagnosis not present

## 2021-01-01 DIAGNOSIS — Z043 Encounter for examination and observation following other accident: Secondary | ICD-10-CM | POA: Diagnosis not present

## 2021-01-01 DIAGNOSIS — K819 Cholecystitis, unspecified: Secondary | ICD-10-CM | POA: Diagnosis present

## 2021-01-01 DIAGNOSIS — E46 Unspecified protein-calorie malnutrition: Secondary | ICD-10-CM | POA: Diagnosis not present

## 2021-01-01 DIAGNOSIS — Z86711 Personal history of pulmonary embolism: Secondary | ICD-10-CM

## 2021-01-01 DIAGNOSIS — K810 Acute cholecystitis: Secondary | ICD-10-CM | POA: Diagnosis not present

## 2021-01-01 DIAGNOSIS — R911 Solitary pulmonary nodule: Secondary | ICD-10-CM | POA: Diagnosis present

## 2021-01-01 DIAGNOSIS — R41841 Cognitive communication deficit: Secondary | ICD-10-CM | POA: Diagnosis not present

## 2021-01-01 DIAGNOSIS — Z955 Presence of coronary angioplasty implant and graft: Secondary | ICD-10-CM | POA: Diagnosis not present

## 2021-01-01 DIAGNOSIS — M6258 Muscle wasting and atrophy, not elsewhere classified, other site: Secondary | ICD-10-CM | POA: Diagnosis not present

## 2021-01-01 DIAGNOSIS — Z681 Body mass index (BMI) 19 or less, adult: Secondary | ICD-10-CM

## 2021-01-01 DIAGNOSIS — S72142A Displaced intertrochanteric fracture of left femur, initial encounter for closed fracture: Principal | ICD-10-CM | POA: Diagnosis present

## 2021-01-01 DIAGNOSIS — Z66 Do not resuscitate: Secondary | ICD-10-CM | POA: Diagnosis present

## 2021-01-01 DIAGNOSIS — J329 Chronic sinusitis, unspecified: Secondary | ICD-10-CM | POA: Diagnosis not present

## 2021-01-01 DIAGNOSIS — M6281 Muscle weakness (generalized): Secondary | ICD-10-CM | POA: Diagnosis not present

## 2021-01-01 DIAGNOSIS — Z7901 Long term (current) use of anticoagulants: Secondary | ICD-10-CM | POA: Diagnosis not present

## 2021-01-01 DIAGNOSIS — R9431 Abnormal electrocardiogram [ECG] [EKG]: Secondary | ICD-10-CM | POA: Diagnosis not present

## 2021-01-01 DIAGNOSIS — Z87440 Personal history of urinary (tract) infections: Secondary | ICD-10-CM

## 2021-01-01 DIAGNOSIS — R498 Other voice and resonance disorders: Secondary | ICD-10-CM | POA: Diagnosis not present

## 2021-01-01 DIAGNOSIS — Z20822 Contact with and (suspected) exposure to covid-19: Secondary | ICD-10-CM | POA: Diagnosis present

## 2021-01-01 DIAGNOSIS — K632 Fistula of intestine: Secondary | ICD-10-CM | POA: Diagnosis present

## 2021-01-01 DIAGNOSIS — Q283 Other malformations of cerebral vessels: Secondary | ICD-10-CM | POA: Diagnosis not present

## 2021-01-01 DIAGNOSIS — N1832 Chronic kidney disease, stage 3b: Secondary | ICD-10-CM | POA: Diagnosis not present

## 2021-01-01 DIAGNOSIS — R0602 Shortness of breath: Secondary | ICD-10-CM | POA: Diagnosis not present

## 2021-01-01 DIAGNOSIS — I700 Atherosclerosis of aorta: Secondary | ICD-10-CM | POA: Diagnosis not present

## 2021-01-01 DIAGNOSIS — I70202 Unspecified atherosclerosis of native arteries of extremities, left leg: Secondary | ICD-10-CM | POA: Diagnosis not present

## 2021-01-01 DIAGNOSIS — S7292XA Unspecified fracture of left femur, initial encounter for closed fracture: Secondary | ICD-10-CM | POA: Diagnosis not present

## 2021-01-01 DIAGNOSIS — H8110 Benign paroxysmal vertigo, unspecified ear: Secondary | ICD-10-CM | POA: Diagnosis not present

## 2021-01-01 DIAGNOSIS — Z751 Person awaiting admission to adequate facility elsewhere: Secondary | ICD-10-CM

## 2021-01-01 DIAGNOSIS — S72142D Displaced intertrochanteric fracture of left femur, subsequent encounter for closed fracture with routine healing: Secondary | ICD-10-CM | POA: Diagnosis not present

## 2021-01-01 DIAGNOSIS — R1312 Dysphagia, oropharyngeal phase: Secondary | ICD-10-CM | POA: Diagnosis not present

## 2021-01-01 DIAGNOSIS — I517 Cardiomegaly: Secondary | ICD-10-CM | POA: Diagnosis not present

## 2021-01-01 DIAGNOSIS — Z79890 Hormone replacement therapy (postmenopausal): Secondary | ICD-10-CM

## 2021-01-01 DIAGNOSIS — R2681 Unsteadiness on feet: Secondary | ICD-10-CM | POA: Diagnosis not present

## 2021-01-01 HISTORY — DX: Hyperlipidemia, unspecified: E78.5

## 2021-01-01 HISTORY — DX: Paroxysmal atrial fibrillation: I48.0

## 2021-01-01 HISTORY — DX: Hypothyroidism, unspecified: E03.9

## 2021-01-01 HISTORY — DX: Atherosclerotic heart disease of native coronary artery without angina pectoris: I25.10

## 2021-01-01 HISTORY — DX: Essential (primary) hypertension: I10

## 2021-01-01 LAB — SURGICAL PCR SCREEN
MRSA, PCR: NEGATIVE
Staphylococcus aureus: NEGATIVE

## 2021-01-01 LAB — COMPREHENSIVE METABOLIC PANEL
ALT: 14 U/L (ref 0–44)
AST: 21 U/L (ref 15–41)
Albumin: 3.4 g/dL — ABNORMAL LOW (ref 3.5–5.0)
Alkaline Phosphatase: 77 U/L (ref 38–126)
Anion gap: 9 (ref 5–15)
BUN: 19 mg/dL (ref 8–23)
CO2: 27 mmol/L (ref 22–32)
Calcium: 9.4 mg/dL (ref 8.9–10.3)
Chloride: 102 mmol/L (ref 98–111)
Creatinine, Ser: 1.26 mg/dL — ABNORMAL HIGH (ref 0.44–1.00)
GFR, Estimated: 39 mL/min — ABNORMAL LOW (ref >60)
Glucose, Bld: 110 mg/dL — ABNORMAL HIGH (ref 70–99)
Potassium: 4.6 mmol/L (ref 3.5–5.1)
Sodium: 138 mmol/L (ref 135–145)
Total Bilirubin: 0.2 mg/dL — ABNORMAL LOW (ref 0.3–1.2)
Total Protein: 7.2 g/dL (ref 6.5–8.1)

## 2021-01-01 LAB — RESP PANEL BY RT-PCR (FLU A&B, COVID) ARPGX2
Influenza A by PCR: NEGATIVE
Influenza B by PCR: NEGATIVE
SARS Coronavirus 2 by RT PCR: NEGATIVE

## 2021-01-01 LAB — CBC
HCT: 45 % (ref 36.0–46.0)
Hemoglobin: 14 g/dL (ref 12.0–15.0)
MCH: 31.4 pg (ref 26.0–34.0)
MCHC: 31.1 g/dL (ref 30.0–36.0)
MCV: 100.9 fL — ABNORMAL HIGH (ref 80.0–100.0)
Platelets: 219 10*3/uL (ref 150–400)
RBC: 4.46 MIL/uL (ref 3.87–5.11)
RDW: 15.6 % — ABNORMAL HIGH (ref 11.5–15.5)
WBC: 7.5 10*3/uL (ref 4.0–10.5)
nRBC: 0 % (ref 0.0–0.2)

## 2021-01-01 LAB — TYPE AND SCREEN
ABO/RH(D): O POS
Antibody Screen: NEGATIVE

## 2021-01-01 LAB — PROTIME-INR
INR: 1.2 (ref 0.8–1.2)
Prothrombin Time: 14.9 seconds (ref 11.4–15.2)

## 2021-01-01 LAB — I-STAT CHEM 8, ED
BUN: 21 mg/dL (ref 8–23)
Calcium, Ion: 1.1 mmol/L — ABNORMAL LOW (ref 1.15–1.40)
Chloride: 102 mmol/L (ref 98–111)
Creatinine, Ser: 1.3 mg/dL — ABNORMAL HIGH (ref 0.44–1.00)
Glucose, Bld: 107 mg/dL — ABNORMAL HIGH (ref 70–99)
HCT: 45 % (ref 36.0–46.0)
Hemoglobin: 15.3 g/dL — ABNORMAL HIGH (ref 12.0–15.0)
Potassium: 4.5 mmol/L (ref 3.5–5.1)
Sodium: 140 mmol/L (ref 135–145)
TCO2: 28 mmol/L (ref 22–32)

## 2021-01-01 LAB — SAMPLE TO BLOOD BANK

## 2021-01-01 LAB — ABO/RH: ABO/RH(D): O POS

## 2021-01-01 MED ORDER — CHLORHEXIDINE GLUCONATE 4 % EX LIQD
60.0000 mL | Freq: Once | CUTANEOUS | Status: AC
  Administered 2021-01-02: 4 via TOPICAL
  Filled 2021-01-01: qty 60

## 2021-01-01 MED ORDER — POLYETHYLENE GLYCOL 3350 17 G PO PACK: 17.0000 g | PACK | Freq: Every day | ORAL | Status: DC | PRN

## 2021-01-01 MED ORDER — MELATONIN 3 MG PO TABS
3.0000 mg | ORAL_TABLET | Freq: Every evening | ORAL | Status: DC | PRN
  Administered 2021-01-05: 3 mg via ORAL
  Filled 2021-01-01: qty 1

## 2021-01-01 MED ORDER — LACTATED RINGERS IV SOLN: INTRAVENOUS | Status: DC

## 2021-01-01 MED ORDER — POVIDONE-IODINE 10 % EX SWAB
2.0000 "application " | Freq: Once | CUTANEOUS | Status: AC
  Administered 2021-01-02: 2 via TOPICAL

## 2021-01-01 MED ORDER — METHOCARBAMOL 500 MG PO TABS
500.0000 mg | ORAL_TABLET | Freq: Four times a day (QID) | ORAL | Status: DC | PRN
  Administered 2021-01-01 (×2): 500 mg via ORAL
  Filled 2021-01-01 (×2): qty 1

## 2021-01-01 MED ORDER — OXYCODONE HCL 5 MG PO TABS
5.0000 mg | ORAL_TABLET | Freq: Four times a day (QID) | ORAL | Status: DC | PRN
Start: 2021-01-01 — End: 2021-01-01

## 2021-01-01 MED ORDER — CEFAZOLIN SODIUM-DEXTROSE 2-4 GM/100ML-% IV SOLN
2.0000 g | INTRAVENOUS | Status: AC
  Administered 2021-01-02: 2 g via INTRAVENOUS
  Filled 2021-01-01: qty 100

## 2021-01-01 MED ORDER — ONDANSETRON HCL 4 MG/2ML IJ SOLN
4.0000 mg | Freq: Once | INTRAMUSCULAR | Status: AC
  Administered 2021-01-01: 4 mg via INTRAVENOUS
  Filled 2021-01-01: qty 2

## 2021-01-01 MED ORDER — METOPROLOL SUCCINATE ER 25 MG PO TB24
50.0000 mg | ORAL_TABLET | Freq: Every day | ORAL | Status: DC
  Administered 2021-01-01 – 2021-01-06 (×6): 50 mg via ORAL
  Filled 2021-01-01 (×6): qty 2

## 2021-01-01 MED ORDER — BISACODYL 5 MG PO TBEC: 5.0000 mg | DELAYED_RELEASE_TABLET | Freq: Every day | ORAL | Status: DC | PRN

## 2021-01-01 MED ORDER — HYDROMORPHONE HCL 1 MG/ML IJ SOLN
0.5000 mg | INTRAMUSCULAR | Status: DC | PRN
Start: 2021-01-01 — End: 2021-01-01
  Administered 2021-01-01: 0.5 mg via INTRAVENOUS
  Filled 2021-01-01: qty 1

## 2021-01-01 MED ORDER — MORPHINE SULFATE (PF) 2 MG/ML IV SOLN
2.0000 mg | INTRAVENOUS | Status: DC | PRN
  Administered 2021-01-01 – 2021-01-03 (×3): 2 mg via INTRAVENOUS
  Filled 2021-01-01 (×4): qty 1

## 2021-01-01 MED ORDER — ACETAMINOPHEN 325 MG PO TABS: 650.0000 mg | ORAL_TABLET | Freq: Four times a day (QID) | ORAL | Status: DC | PRN

## 2021-01-01 MED ORDER — DILTIAZEM HCL 25 MG/5ML IV SOLN
5.0000 mg | Freq: Four times a day (QID) | INTRAVENOUS | Status: DC | PRN
  Filled 2021-01-01: qty 5

## 2021-01-01 MED ORDER — METHOCARBAMOL 1000 MG/10ML IJ SOLN: 500.0000 mg | Freq: Four times a day (QID) | INTRAVENOUS | Status: DC | PRN

## 2021-01-01 MED ORDER — ATORVASTATIN CALCIUM 10 MG PO TABS
10.0000 mg | ORAL_TABLET | Freq: Every day | ORAL | Status: DC
  Administered 2021-01-01 – 2021-01-05 (×5): 10 mg via ORAL
  Filled 2021-01-01 (×5): qty 1

## 2021-01-01 MED ORDER — TRIMETHOPRIM 100 MG PO TABS
100.0000 mg | ORAL_TABLET | Freq: Every morning | ORAL | Status: DC
  Administered 2021-01-01 – 2021-01-06 (×6): 100 mg via ORAL
  Filled 2021-01-01 (×6): qty 1

## 2021-01-01 MED ORDER — HYDROCODONE-ACETAMINOPHEN 5-325 MG PO TABS
1.0000 | ORAL_TABLET | Freq: Four times a day (QID) | ORAL | Status: DC | PRN
  Administered 2021-01-01: 2 via ORAL
  Administered 2021-01-01 – 2021-01-03 (×6): 1 via ORAL
  Administered 2021-01-04: 2 via ORAL
  Administered 2021-01-04 (×2): 1 via ORAL
  Administered 2021-01-05 – 2021-01-06 (×3): 2 via ORAL
  Filled 2021-01-01 (×2): qty 1
  Filled 2021-01-01: qty 2
  Filled 2021-01-01 (×2): qty 1
  Filled 2021-01-01 (×2): qty 2
  Filled 2021-01-01: qty 1
  Filled 2021-01-01 (×3): qty 2
  Filled 2021-01-01 (×2): qty 1

## 2021-01-01 MED ORDER — ONDANSETRON HCL 4 MG/2ML IJ SOLN
4.0000 mg | Freq: Four times a day (QID) | INTRAMUSCULAR | Status: DC | PRN
  Administered 2021-01-03: 4 mg via INTRAVENOUS
  Filled 2021-01-01: qty 2

## 2021-01-01 MED ORDER — DOCUSATE SODIUM 100 MG PO CAPS
100.0000 mg | ORAL_CAPSULE | Freq: Two times a day (BID) | ORAL | Status: DC
  Administered 2021-01-01 – 2021-01-06 (×11): 100 mg via ORAL
  Filled 2021-01-01 (×11): qty 1

## 2021-01-01 MED ORDER — LACTATED RINGERS IV SOLN: INTRAVENOUS | Status: AC

## 2021-01-01 MED ORDER — LACTATED RINGERS IV BOLUS
500.0000 mL | Freq: Once | INTRAVENOUS | Status: AC
  Administered 2021-01-01: 500 mL via INTRAVENOUS

## 2021-01-01 MED ORDER — FENTANYL CITRATE PF 50 MCG/ML IJ SOSY
50.0000 ug | PREFILLED_SYRINGE | Freq: Once | INTRAMUSCULAR | Status: AC
  Administered 2021-01-01: 50 ug via INTRAVENOUS
  Filled 2021-01-01: qty 1

## 2021-01-01 MED ORDER — ATORVASTATIN CALCIUM 10 MG PO TABS: 10.0000 mg | ORAL_TABLET | Freq: Every day | ORAL | Status: DC

## 2021-01-01 MED ORDER — LEVOTHYROXINE SODIUM 100 MCG PO TABS
100.0000 ug | ORAL_TABLET | Freq: Every day | ORAL | Status: DC
  Administered 2021-01-02 – 2021-01-06 (×5): 100 ug via ORAL
  Filled 2021-01-01 (×5): qty 1

## 2021-01-01 NOTE — Anesthesia Preprocedure Evaluation (Addendum)
Anesthesia Evaluation  Patient identified by MRN, date of birth, ID band Patient awake    Reviewed: Allergy & Precautions, NPO status , Patient's Chart, lab work & pertinent test results, reviewed documented beta blocker date and time   History of Anesthesia Complications Negative for: history of anesthetic complications  Airway Mallampati: II  TM Distance: >3 FB Neck ROM: Full    Dental  (+) Edentulous Upper, Edentulous Lower   Pulmonary COPD (at night),  oxygen dependent,    breath sounds clear to auscultation       Cardiovascular hypertension, Pt. on medications and Pt. on home beta blockers (-) angina+ CAD, + Cardiac Stents and + Peripheral Vascular Disease  + dysrhythmias Atrial Fibrillation  Rhythm:Regular Rate:Normal     Neuro/Psych Dementia negative neurological ROS     GI/Hepatic negative GI ROS, Neg liver ROS,   Endo/Other  Hypothyroidism   Renal/GU Renal InsufficiencyRenal disease     Musculoskeletal  (+) Arthritis ,   Abdominal   Peds  Hematology eliquis Hb 12.2   Anesthesia Other Findings   Reproductive/Obstetrics                            Anesthesia Physical Anesthesia Plan  ASA: 4  Anesthesia Plan: General   Post-op Pain Management:    Induction: Intravenous  PONV Risk Score and Plan: 3 and Ondansetron, Dexamethasone and Treatment may vary due to age or medical condition  Airway Management Planned: Oral ETT  Additional Equipment: None  Intra-op Plan:   Post-operative Plan: Extubation in OR  Informed Consent: I have reviewed the patients History and Physical, chart, labs and discussed the procedure including the risks, benefits and alternatives for the proposed anesthesia with the patient or authorized representative who has indicated his/her understanding and acceptance.   Patient has DNR.  Discussed DNR with patient, Discussed DNR with power of attorney and  Suspend DNR.   Consent reviewed with POA  Plan Discussed with: CRNA and Surgeon  Anesthesia Plan Comments:         Anesthesia Quick Evaluation

## 2021-01-01 NOTE — Progress Notes (Signed)
Greeted patient in ED room # 14 - alert and talking.  No family present, no emotional or spiritual distress exhibited at this time.  Will remain available for any additional support.    Rev. Valma Cava, M.Divnity

## 2021-01-01 NOTE — Progress Notes (Addendum)
   01/01/21 1955 01/01/21 2211  Assess: MEWS Score  Temp 98.3 F (36.8 C) (!) 97.5 F (36.4 C)  BP (!) 129/98 93/64  Pulse Rate (!) 129 (!) 132  Resp 18 18  SpO2  --  97 %  O2 Device  --  Nasal Cannula  Assess: MEWS Score  MEWS Temp 0 0  MEWS Systolic 0 1  MEWS Pulse 2 3  MEWS RR 0 0  MEWS LOC 0 0  MEWS Score 2 4  MEWS Score Color Yellow Red  Assess: if the MEWS score is Yellow or Red  Were vital signs taken at a resting state?  --  Yes  Focused Assessment  --  Change from prior assessment (see assessment flowsheet)  Early Detection of Sepsis Score *See Row Information*  --  Low  MEWS guidelines implemented *See Row Information*  --  Yes  Treat  MEWS Interventions  --  Escalated (See documentation below)  Pain Scale  --  0-10  Pain Score  --  5  Faces Pain Scale  --  2  Pain Type  --  Acute pain  Pain Location  --  Hip  Pain Orientation  --  Left  Pain Descriptors / Indicators  --  Aching  Pain Frequency  --  Intermittent  Pain Onset  --  With Activity  Patients Stated Pain Goal  --  0  Pain Intervention(s)  --  Cold applied  Multiple Pain Sites  --  No  Take Vital Signs  Increase Vital Sign Frequency   --  Red: Q 1hr X 4 then Q 4hr X 4, if remains red, continue Q 4hrs  Escalate  MEWS: Escalate  --  Red: discuss with charge nurse/RN and provider, consider discussing with RRT  Notify: Charge Nurse/RN  Name of Charge Nurse/RN Notified  --  Izora Gala, RN  Date Charge Nurse/RN Notified  --  01/01/21  Time Charge Nurse/RN Notified  --  2215  Notify: Provider  Provider Name/Title  --  X. Blount  Date Provider Notified  --  01/01/21  Time Provider Notified  --  2215  Notification Type  --  Page  Notification Reason  --  Change in status  Provider response  --  See new orders  Date of Provider Response  --  01/01/21  Time of Provider Response  --  2218  Notify: Rapid Response  Name of Rapid Response RN Notified  --  Mindy, RN  Date Rapid Response Notified  --  01/01/21   Time Rapid Response Notified  --  2215  Document  Progress note created (see row info)  --  Yes  Pt's BP at this time is 93/64. RN held [employer] for now, hospitalist aware.Hospitalist ordered fluid bolus. Will monitor pt.

## 2021-01-01 NOTE — H&P (Addendum)
History and Physical    [name] PPJ:[id number] DOB: Aug 15, 1920 DOA: 01/01/2021  PCP: Holland Commons, FNP Consultants:  [place] Patient coming from: [place]; NOK: [name], [name], [phone]  Chief Complaint: Lytle Michaels  HPI: [name] is a 87 y.o. female with medical history significant of CAD s/p stents; HTN; HLD; afib on Eliquis; and hypothyroidism presenting with a fall.  She was recently hospitalized with cholecystitis (11/2020) and discharged to [facility].  She reports that she got up overnight to use the bathroom and her legs buckled underneath her.  Her left leg has throbbing intermittent pain.  She is supposed to use a walker but does not always use it and was not using it last night.  Her son would prefer that she not take medications, particularly ones that she does not need.  He is in agreement with stopping Eliquis.   ED Course: [place], per Dr. Nevada Crane:  87 year old female presents after a fall.  She has a left hip fracture and is on Eliquis.  Dr. Lyla Glassing was consulted by EDP and recommended to hold off Eliquis for the next 24 to 36 hours.  Patient received Eliquis 8 hours prior to presentation.  Per orthopedic surgery patient can eat today.  Made n.p.o. after midnight.  Review of Systems: As per HPI; otherwise review of systems reviewed and negative.   Ambulatory Status:  Ambulates with a walker, usually  COVID Vaccine Status:  Complete  Past Medical History:  Diagnosis Date   CAD (coronary artery disease), native coronary artery    s/p stents   Dyslipidemia    Hypertension    Hypothyroidism (acquired)    PAF (paroxysmal atrial fibrillation) ([place])    on Eliquis    History reviewed. No pertinent surgical history.  Social History   Socioeconomic History   Marital status: Unknown    Spouse name: Not on file   Number of children: Not on file   Years of education: Not on file   Highest education level: Not on file  Occupational  History   Not on file  Tobacco Use   Smoking status: Never   Smokeless tobacco: Never  Substance and Sexual Activity   Alcohol use: Not Currently   Drug use: Never   Sexual activity: Not on file  Other Topics Concern   Not on file  Social History Narrative   Not on file   Social Determinants of Health   Financial Resource Strain: Not on file  Food Insecurity: Not on file  Transportation Needs: Not on file  Physical Activity: Not on file  Stress: Not on file  Social Connections: Not on file  Intimate Partner Violence: Not on file    No Known Allergies  History reviewed. No pertinent family history.  Prior to Admission medications   Not on File    Physical Exam: Vitals:   01/01/21 0830 01/01/21 0900 01/01/21 0930 01/01/21 1000  BP: (!) 165/144 120/87 130/79 135/86  Pulse: (!) 58 96 (!) 101 93  Resp: 18 15 (!) 34 20  Temp:      TempSrc:      SpO2: 95% 90% (!) 89% 91%  Weight:      Height:         General:  Appears calm and comfortable and is in NAD; hip pain with even minimal movement Eyes:  EOMI, normal lids, iris ENT: hard of hearing,  grossly normal lips & tongue, mmm; artificial dentition Neck:  no LAD, masses  or thyromegaly Cardiovascular:  RRR, no m/r/g. No LE edema.  Respiratory:   CTA bilaterally with no wheezes/rales/rhonchi.  Normal respiratory effort.   Abdomen:  soft, ND, mild generalized TTP Skin:  no rash or induration seen on limited exam Musculoskeletal:  L leg is shortened and externally rotated Lower extremity:  No LE edema.   2+ distal pulses. Psychiatric:  grossly normal mood and affect, speech fluent and appropriate, A&O x 3 Neurologic:  CN 2-12 grossly intact, moves all extremities in coordinated fashion other than LLE    Radiological Exams on Admission: Independently reviewed - see discussion in A/P where applicable  CT HEAD WO CONTRAST  Result Date: 01/01/2021 CLINICAL DATA:  87 year old female status post fall with hip fracture.  On blood thinners. EXAM: CT HEAD WITHOUT CONTRAST TECHNIQUE: Contiguous axial images were obtained from the base of the skull through the vertex without intravenous contrast. COMPARISON:  Brain MRI 08/06/2020.  Head CT 08/05/2020. FINDINGS: Brain: Stable cerebral volume. No midline shift, mass effect, or evidence of intracranial mass lesion. Stable ventricle size and configuration. Stable patchy and confluent bilateral white matter hypodensity, tracking into the deep white matter capsules and thalamus on the left. Stable mild heterogeneity in the brainstem. No acute intracranial hemorrhage identified. No cortically based acute infarct identified. Vascular: Calcified atherosclerosis at the skull base. No suspicious intracranial vascular hyperdensity. 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 No skull fracture identified. Sinuses/Orbits: Mildly increased mucosal thickening and bubbly opacity in the left maxillary and bilateral sphenoid sinuses since April. Tympanic cavities and mastoids remain clear. Other: No acute orbit or scalp soft tissue injury. IMPRESSION: 1. No acute intracranial abnormality or acute traumatic injury identified. 2. Stable CT appearance of chronic small vessel disease. 3. Mildly increased paranasal sinus inflammation since April. Electronically Signed   By: Genevie Ann M.D.   On: 01/01/2021 05:36   CT CERVICAL SPINE WO CONTRAST  Result Date: 01/01/2021 CLINICAL DATA:  87 year old female status post fall with hip fracture. On blood thinners. EXAM: CT CERVICAL SPINE WITHOUT CONTRAST TECHNIQUE: Multidetector CT imaging of the cervical spine was performed without intravenous contrast. Multiplanar CT image reconstructions were also generated. COMPARISON:  Head CT today. Cervical spine CT 08/03/2020. FINDINGS: Alignment: Stable since April. Chronic anterolisthesis of C4 on C5 and C7 on T1. Bilateral posterior element alignment is within normal limits. Skull base  and vertebrae: Visualized skull base is intact. No atlanto-occipital dissociation. C1 and C2 are chronically degenerated but appears stable and intact. Congenital incomplete ossification of the posterior C1 ring. No acute osseous abnormality identified. Soft tissues and spinal canal: No prevertebral fluid or swelling. No visible canal hematoma. Advanced bilateral cervical carotid calcified atherosclerosis. Disc levels: Chronic severe C1-C2 degeneration including bulky odontoid subchondral cysts, partially calcified ligamentous hypertrophy. Widespread severe facet degeneration. Advanced calcified disc degeneration at C4-C5 in the setting of spondylolisthesis with chronic spinal stenosis there. Bulky chronic ligament flavum hypertrophy and calcification at the cervicothoracic junction, also with a degree of spinal stenosis. Upper chest: Stable. IMPRESSION: 1. No acute traumatic injury identified in the cervical spine. 2. Chronic severe cervical spine degeneration appears stable. 3. Advanced carotid atherosclerosis. Electronically Signed   By: Genevie Ann M.D.   On: 01/01/2021 05:40   DG Chest Port 1 View  Result Date: 01/01/2021 CLINICAL DATA:  87 year old female status post fall with hip fracture. On blood thinners. EXAM: PORTABLE CHEST 1 VIEW COMPARISON:  Portable chest 05/30/2020 and earlier, including CTA abdomen 11/26/2020. FINDINGS: Portable AP  semi upright view at 0445 hours. Lower lung volumes. Stable mild cardiomegaly. Calcified aortic atherosclerosis. Other mediastinal contours are within normal limits. Visualized tracheal air column is within normal limits. Stable elevation of the right hemidiaphragm. Allowing for portable technique the lungs are clear. No pneumothorax or pleural effusion. Osteopenia. Chronic deformity proximal left humerus. No acute osseous abnormality identified. IMPRESSION: No acute cardiopulmonary abnormality. Electronically Signed   By: Genevie Ann M.D.   On: 01/01/2021 05:32   DG Knee  Complete 4 Views Left  Result Date: 01/01/2021 CLINICAL DATA:  87 year old female status post fall with left hip fracture. EXAM: LEFT KNEE - 3 VIEW COMPARISON:  Left hip series today. FINDINGS: Each of these three views is oblique due to difficulty with patient positioning in the setting of hip fracture. Advanced calcified peripheral vascular disease. Bone mineralization is within normal limits for age. Distal femur, patella, proximal tibia and fibula appear intact. Joint spaces appear normal for age. No joint effusion is evident. IMPRESSION: 1. No acute fracture or dislocation identified about the left knee. 2. Advanced calcified peripheral vascular disease. Electronically Signed   By: Genevie Ann M.D.   On: 01/01/2021 07:37   DG HIP PORT UNILAT WITH PELVIS 1V LEFT  Result Date: 01/01/2021 CLINICAL DATA:  87 year old female status post fall. On blood thinners. EXAM: DG HIP (WITH OR WITHOUT PELVIS) 1V PORT LEFT COMPARISON:  CTA abdomen and pelvis 11/26/2020. FINDINGS: Portable AP and cross-table lateral views of the pelvis and left hip. Acute left femur intertrochanteric fracture with mild varus impaction. Left femoral head remains normally located. Pelvis appears to remain intact. Grossly intact proximal right femur. Extensive iliofemoral calcified atherosclerosis. Nonobstructed visible bowel gas pattern. IMPRESSION: Acute left femur intertrochanteric fracture with mild varus impaction. Electronically Signed   By: Genevie Ann M.D.   On: 01/01/2021 05:30    EKG: Independently reviewed.  Afib with rate 105; artifact with no evidence of acute ischemia   Labs on Admission: I have personally reviewed the available labs and imaging studies at the time of the admission.  Pertinent labs:   BUN 19/Creatinine 1.26/GFR 39 - stable Albumin 3.4 Unremarkable CBC INR 1.2 COVID/flu negative   Assessment/Plan Principal Problem:   Hip fracture, left (HCC) Active Problems:   CAD (coronary artery disease), native  coronary artery   Dyslipidemia   Hypertension   Hypothyroidism (acquired)   PAF (paroxysmal atrial fibrillation) (HCC)    Hip fracture -Mechanical fall resulting in hip fracture -Orthopedics consulted; plan for surgery tomorrow due to Eliquis dosing last night -NPO after midnight in anticipation of surgical repair tomorrow -SCDs overnight, start Lovenox post-operatively (or as per ortho) -Pain control with Robxain, Vicodin, and Morphine prn -TOC consult for rehab placement - she is coming from and returning to [facility] -Will need PT consult post-operatively -Hip fracture order set utilized  Cholecystocolonic fistula, suspected; Acalculus cholecystitis -Recently admitted for this issue -s/p antibiotics -MRCP during last admission with suspected cholecystocolonic fistula not well demonstrated by MR, but notable diffuse gallbladder wall thickening which could reflect a calculus cholecystitis, mild extrahepatic biliary dilation without evidence of choledocholithiasis.  -HIDA scan notable for no filling within the gallbladder but patent common bile duct.   -She was treated with antibiotics -Perc drain was considered but family preferred avoidance of aggressive measures if possible -Appears to be generally stable at this time  Stage 3a-b CKD -Appears to be nearing the transition from 3a to 3b -Stable at this time -Will continue to follow   PVD -  Proximal SMA stenosis and L SFA origin stenosis noted during last hospitalization -Family prefers conservative care -Continue Lipitor for now, but could consider stopping based on ongoing family discussions   Afib -Rate controlled with Dilt, Metoprolol  -Will hold Diltiazem for now and monitor -Will continue metoprolol but change to Toprol XL 50 mg daily and can increase if needed for HR/BP -Will hold Eliquis and suggest discontinuation due to fall risk  Hypothyroidism -Continue Synthroid at current dose for now   Urinary  issues -Hold myrbetriq for now, consider d/c -Continue trimethoprim daily for UTI prophylaxis  DNR -Out of facility DNR form is prominently displayed at the bedside     Note: This patient has been tested and is negative for the novel coronavirus COVID-19. The patient has been fully vaccinated against COVID-19.   Level of care: Med-Surg DVT prophylaxis:  SCDs until approved for Lovenox by orthopedics Code Status:  DNR - confirmed with patient/family Family Communication: None present initially, but I spoke with her son at the bedside later in the morning Disposition Plan:  SNF once clinically improved Consults called: Orthopedics; TOC team, Nutrition; will need PT post-operatively  Admission status: Admit - It is my clinical opinion that admission to INPATIENT is reasonable and necessary because of the expectation that this patient will require hospital care that crosses at least 2 midnights to treat this condition based on the medical complexity of the problems presented.  Given the aforementioned information, the predictability of an adverse outcome is felt to be significant.     Karmen Bongo MD [facility]   How to contact the [facility] Attending or Consulting provider [place] or covering provider during after hours [place], for this patient?  Check the care team in [facility] and look for a) attending/consulting [facility] provider listed and b) the [facility] team listed Log into [email or web address] and use [place]'s universal password to access. If you do not have the password, please contact the hospital operator. Locate the [facility] provider you are looking for under [facility] and page to a number that you can be directly reached. If you still have difficulty reaching the provider, please page the [facility] (Director on Call) for the Hospitalists listed on amion for assistance.   01/01/2021, 10:25 AM

## 2021-01-01 NOTE — ED Notes (Signed)
Trauma Response Nurse Note-  Reason for Call / Reason for Trauma activation:   - level 2 trauma, fall on blood thinner   Interventions:  - IV access obtained, portable x-rays obtained along with CT head and c-spine. Blood work obtained,.   Plan of Care as of this note:  - waiting on blood work and imaging results  Event Summary:   - pt came in as a level 2 trauma, fall on blood thinners. Pt states she was attempting to get up to go to the bathroom and fell. Pt states hitting her head, but the majority of the pain is in her left hip. Left leg noted to be shorted than the right. Pt is alert and pleasant, but moaning in pain prior to pain medications given. IV access obtained on second attempt. Pt taken to CT and back in room at this time. Pt complaining of left knee pain and EDP was notified.

## 2021-01-01 NOTE — ED Triage Notes (Signed)
Pt brought in by EMS for a fall at [facility]. Pt c/o left hip pain.

## 2021-01-01 NOTE — ED Notes (Signed)
Pt placed on 2LPM of oxygen via [place] as oxygen dropped to 86% on room air after pain medications given. EDP and primary RN notified.

## 2021-01-01 NOTE — ED Notes (Signed)
Son at bedside.

## 2021-01-01 NOTE — Consult Note (Signed)
Reason for Consult:Left hip fx Referring Physician: Karmen Bongo Time called: 3335 Time at bedside: Buxton Sturgess is an 87 y.o. female.  HPI: [name] was at the ALF where she resides when she got up at night, lost her balance, and fell. She had immediate left hip pain and could not get up. She was brought to the ED where x-rays showed a left hip fx and orthopedic surgery was consulted. She c/o localized pain to the area. She usually uses a RW to ambulate.  Past Medical History:  Diagnosis Date   CAD (coronary artery disease), native coronary artery    s/p stents   Dyslipidemia    Hypertension    Hypothyroidism (acquired)    PAF (paroxysmal atrial fibrillation) ([facility])    on Eliquis    History reviewed. No pertinent surgical history.  History reviewed. No pertinent family history.  Social History:  has no history on file for tobacco use, alcohol use, and drug use.  Allergies: No Known Allergies  Medications: I have reviewed the patient's current medications.  Results for orders placed or performed during the hospital encounter of 01/01/21 (from the past 48 hour(s))  Resp Panel by RT-PCR (Flu A&B, Covid) Nasopharyngeal Swab     Status: None   Collection Time: 01/01/21  4:39 AM   Specimen: Nasopharyngeal Swab; Nasopharyngeal(NP) swabs in vial transport medium  Result Value Ref Range   SARS Coronavirus 2 by RT PCR NEGATIVE NEGATIVE    Comment: (NOTE) SARS-CoV-2 target nucleic acids are NOT DETECTED.  The SARS-CoV-2 RNA is generally detectable in upper respiratory specimens during the acute phase of infection. The lowest concentration of SARS-CoV-2 viral copies this assay can detect is 138 copies/mL. A negative result does not preclude SARS-Cov-2 infection and should not be used as the sole basis for treatment or other patient management decisions. A negative result may occur with  improper specimen collection/handling, submission of specimen other than nasopharyngeal  swab, presence of viral mutation(s) within the areas targeted by this assay, and inadequate number of viral copies(<138 copies/mL). A negative result must be combined with clinical observations, patient history, and epidemiological information. The expected result is Negative.  Fact Sheet for Patients:  EntrepreneurPulse.com.au  Fact Sheet for Healthcare Providers:  IncredibleEmployment.be  This test is no t yet approved or cleared by the [place] FDA and  has been authorized for detection and/or diagnosis of SARS-CoV-2 by FDA under an Emergency Use Authorization (EUA). This EUA will remain  in effect (meaning this test can be used) for the duration of the COVID-19 declaration under Section 564(b)(1) of the Act, 21 U.S.C.section 360bbb-3(b)(1), unless the authorization is terminated  or revoked sooner.       Influenza A by PCR NEGATIVE NEGATIVE   Influenza B by PCR NEGATIVE NEGATIVE    Comment: (NOTE) The Xpert Xpress SARS-CoV-2/FLU/RSV plus assay is intended as an aid in the diagnosis of influenza from Nasopharyngeal swab specimens and should not be used as a sole basis for treatment. Nasal washings and aspirates are unacceptable for Xpert Xpress SARS-CoV-2/FLU/RSV testing.  Fact Sheet for Patients: EntrepreneurPulse.com.au  Fact Sheet for Healthcare Providers: IncredibleEmployment.be  This test is not yet approved or cleared by the [place] FDA and has been authorized for detection and/or diagnosis of SARS-CoV-2 by FDA under an Emergency Use Authorization (EUA). This EUA will remain in effect (meaning this test can be used) for the duration of the COVID-19 declaration under Section 564(b)(1) of the Act, 21 U.S.C. section  360bbb-3(b)(1), unless the authorization is terminated or revoked.  Performed at [facility], [place] [address], [place], [place] [address]   Comprehensive metabolic  panel     Status: Abnormal   Collection Time: 01/01/21  4:57 AM  Result Value Ref Range   Sodium 138 135 - 145 mmol/L   Potassium 4.6 3.5 - 5.1 mmol/L   Chloride 102 98 - 111 mmol/L   CO2 27 22 - 32 mmol/L   Glucose, Bld 110 (H) 70 - 99 mg/dL    Comment: Glucose reference range applies only to samples taken after fasting for at least 8 hours.   BUN 19 8 - 23 mg/dL   Creatinine, Ser 1.26 (H) 0.44 - 1.00 mg/dL   Calcium 9.4 8.9 - 10.3 mg/dL   Total Protein 7.2 6.5 - 8.1 g/dL   Albumin 3.4 (L) 3.5 - 5.0 g/dL   AST 21 15 - 41 U/L   ALT 14 0 - 44 U/L   Alkaline Phosphatase 77 38 - 126 U/L   Total Bilirubin 0.2 (L) 0.3 - 1.2 mg/dL   GFR, Estimated 39 (L) >60 mL/min    Comment: (NOTE) Calculated using the CKD-EPI Creatinine Equation (2021)    Anion gap 9 5 - 15    Comment: Performed at [facility] [address], [place], [place] [address]  CBC     Status: Abnormal   Collection Time: 01/01/21  4:57 AM  Result Value Ref Range   WBC 7.5 4.0 - 10.5 K/uL   RBC 4.46 3.87 - 5.11 MIL/uL   Hemoglobin 14.0 12.0 - 15.0 g/dL   HCT 45.0 36.0 - 46.0 %   MCV 100.9 (H) 80.0 - 100.0 fL   MCH 31.4 26.0 - 34.0 pg   MCHC 31.1 30.0 - 36.0 g/dL   RDW 15.6 (H) 11.5 - 15.5 %   Platelets 219 150 - 400 K/uL   nRBC 0.0 0.0 - 0.2 %    Comment: Performed at [facility], [place] [address], [place], [place] [address]  Protime-INR     Status: None   Collection Time: 01/01/21  4:57 AM  Result Value Ref Range   Prothrombin Time 14.9 11.4 - 15.2 seconds   INR 1.2 0.8 - 1.2    Comment: (NOTE) INR goal varies based on device and disease states. Performed at [facility], [place] [address], [place], [place] [address]   Sample to Blood Bank     Status: None   Collection Time: 01/01/21  4:57 AM  Result Value Ref Range   Blood Bank Specimen SAMPLE AVAILABLE FOR TESTING    Sample Expiration      01/04/2021,2359 Performed at [facility], [place] [address], [place], [place] [address]    I-Stat Chem 8, ED     Status: Abnormal   Collection Time: 01/01/21  5:02 AM  Result Value Ref Range   Sodium 140 135 - 145 mmol/L   Potassium 4.5 3.5 - 5.1 mmol/L   Chloride 102 98 - 111 mmol/L   BUN 21 8 - 23 mg/dL   Creatinine, Ser 1.30 (H) 0.44 - 1.00 mg/dL   Glucose, Bld 107 (H) 70 - 99 mg/dL    Comment: Glucose reference range applies only to samples taken after fasting for at least 8 hours.   Calcium, Ion 1.10 (L) 1.15 - 1.40 mmol/L   TCO2 28 22 - 32 mmol/L   Hemoglobin 15.3 (H) 12.0 - 15.0 g/dL  HCT 45.0 36.0 - 46.0 %    CT HEAD WO CONTRAST  Result Date: 01/01/2021 CLINICAL DATA:  87 year old female status post fall with hip fracture. On blood thinners. EXAM: CT HEAD WITHOUT CONTRAST TECHNIQUE: Contiguous axial images were obtained from the base of the skull through the vertex without intravenous contrast. COMPARISON:  Brain MRI 08/06/2020.  Head CT 08/05/2020. FINDINGS: Brain: Stable cerebral volume. No midline shift, mass effect, or evidence of intracranial mass lesion. Stable ventricle size and configuration. Stable patchy and confluent bilateral white matter hypodensity, tracking into the deep white matter capsules and thalamus on the left. Stable mild heterogeneity in the brainstem. No acute intracranial hemorrhage identified. No cortically based acute infarct identified. Vascular: Calcified atherosclerosis at the skull base. No suspicious intracranial vascular hyperdensity. 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 No skull fracture identified. Sinuses/Orbits: Mildly increased mucosal thickening and bubbly opacity in the left maxillary and bilateral sphenoid sinuses since April. Tympanic cavities and mastoids remain clear. Other: No acute orbit or scalp soft tissue injury. IMPRESSION: 1. No acute intracranial abnormality or acute traumatic injury identified. 2. Stable CT appearance of chronic small vessel disease. 3. Mildly increased paranasal  sinus inflammation since April. Electronically Signed   By: Genevie Ann M.D.   On: 01/01/2021 05:36   CT CERVICAL SPINE WO CONTRAST  Result Date: 01/01/2021 CLINICAL DATA:  87 year old female status post fall with hip fracture. On blood thinners. EXAM: CT CERVICAL SPINE WITHOUT CONTRAST TECHNIQUE: Multidetector CT imaging of the cervical spine was performed without intravenous contrast. Multiplanar CT image reconstructions were also generated. COMPARISON:  Head CT today. Cervical spine CT 08/03/2020. FINDINGS: Alignment: Stable since April. Chronic anterolisthesis of C4 on C5 and C7 on T1. Bilateral posterior element alignment is within normal limits. Skull base and vertebrae: Visualized skull base is intact. No atlanto-occipital dissociation. C1 and C2 are chronically degenerated but appears stable and intact. Congenital incomplete ossification of the posterior C1 ring. No acute osseous abnormality identified. Soft tissues and spinal canal: No prevertebral fluid or swelling. No visible canal hematoma. Advanced bilateral cervical carotid calcified atherosclerosis. Disc levels: Chronic severe C1-C2 degeneration including bulky odontoid subchondral cysts, partially calcified ligamentous hypertrophy. Widespread severe facet degeneration. Advanced calcified disc degeneration at C4-C5 in the setting of spondylolisthesis with chronic spinal stenosis there. Bulky chronic ligament flavum hypertrophy and calcification at the cervicothoracic junction, also with a degree of spinal stenosis. Upper chest: Stable. IMPRESSION: 1. No acute traumatic injury identified in the cervical spine. 2. Chronic severe cervical spine degeneration appears stable. 3. Advanced carotid atherosclerosis. Electronically Signed   By: Genevie Ann M.D.   On: 01/01/2021 05:40   DG Chest Port 1 View  Result Date: 01/01/2021 CLINICAL DATA:  87 year old female status post fall with hip fracture. On blood thinners. EXAM: PORTABLE CHEST 1 VIEW COMPARISON:   Portable chest 05/30/2020 and earlier, including CTA abdomen 11/26/2020. FINDINGS: Portable AP semi upright view at 0445 hours. Lower lung volumes. Stable mild cardiomegaly. Calcified aortic atherosclerosis. Other mediastinal contours are within normal limits. Visualized tracheal air column is within normal limits. Stable elevation of the right hemidiaphragm. Allowing for portable technique the lungs are clear. No pneumothorax or pleural effusion. Osteopenia. Chronic deformity proximal left humerus. No acute osseous abnormality identified. IMPRESSION: No acute cardiopulmonary abnormality. Electronically Signed   By: Genevie Ann M.D.   On: 01/01/2021 05:32   DG Knee Complete 4 Views Left  Result Date: 01/01/2021 CLINICAL DATA:  87 year old female status post fall  with left hip fracture. EXAM: LEFT KNEE - 3 VIEW COMPARISON:  Left hip series today. FINDINGS: Each of these three views is oblique due to difficulty with patient positioning in the setting of hip fracture. Advanced calcified peripheral vascular disease. Bone mineralization is within normal limits for age. Distal femur, patella, proximal tibia and fibula appear intact. Joint spaces appear normal for age. No joint effusion is evident. IMPRESSION: 1. No acute fracture or dislocation identified about the left knee. 2. Advanced calcified peripheral vascular disease. Electronically Signed   By: Genevie Ann M.D.   On: 01/01/2021 07:37   DG HIP PORT UNILAT WITH PELVIS 1V LEFT  Result Date: 01/01/2021 CLINICAL DATA:  87 year old female status post fall. On blood thinners. EXAM: DG HIP (WITH OR WITHOUT PELVIS) 1V PORT LEFT COMPARISON:  CTA abdomen and pelvis 11/26/2020. FINDINGS: Portable AP and cross-table lateral views of the pelvis and left hip. Acute left femur intertrochanteric fracture with mild varus impaction. Left femoral head remains normally located. Pelvis appears to remain intact. Grossly intact proximal right femur. Extensive iliofemoral calcified  atherosclerosis. Nonobstructed visible bowel gas pattern. IMPRESSION: Acute left femur intertrochanteric fracture with mild varus impaction. Electronically Signed   By: Genevie Ann M.D.   On: 01/01/2021 05:30    Review of Systems  HENT:  Negative for ear discharge, ear pain, hearing loss and tinnitus.   Eyes:  Negative for photophobia and pain.  Respiratory:  Negative for cough and shortness of breath.   Cardiovascular:  Negative for chest pain.  Gastrointestinal:  Negative for abdominal pain, nausea and vomiting.  Genitourinary:  Negative for dysuria, flank pain, frequency and urgency.  Musculoskeletal:  Positive for arthralgias (Left hip). Negative for back pain, myalgias and neck pain.  Neurological:  Negative for dizziness and headaches.  Hematological:  Does not bruise/bleed easily.  Psychiatric/Behavioral:  The patient is not nervous/anxious.   Blood pressure 135/86, pulse 93, temperature (!) 97.5 F (36.4 C), temperature source Oral, resp. rate 20, height 5\' 6"  (1.676 m), weight 49.9 kg, SpO2 91 %. Physical Exam Constitutional:      General: She is not in acute distress.    Appearance: She is well-developed. She is not diaphoretic.  HENT:     Head: Normocephalic and atraumatic.  Eyes:     General: No scleral icterus.       Right eye: No discharge.        Left eye: No discharge.     Conjunctiva/sclera: Conjunctivae normal.  Cardiovascular:     Rate and Rhythm: Normal rate and regular rhythm.  Pulmonary:     Effort: Pulmonary effort is normal. No respiratory distress.  Musculoskeletal:     Cervical back: Normal range of motion.     Comments: LLE No traumatic wounds, ecchymosis, or rash  Mild TTP hip  No knee or ankle effusion  Knee stable to varus/ valgus and anterior/posterior stress  Sens DPN, SPN, TN intact  Motor EHL, ext, flex, evers 5/5  DP 1+, PT 1+, No significant edema  Skin:    General: Skin is warm and dry.  Neurological:     Mental Status: She is alert.   Psychiatric:        Mood and Affect: Mood normal.        Behavior: Behavior normal.    Assessment/Plan: Left hip fx -- Plan IMN tomorrow by Dr. Sammuel Hines. Please keep NPO after MN. Multiple medical problems including CAD, HTN, HLD, hypothyroidism, and afib on Eliquis -- Please hold Eliquis until  after surgery.    Lisette Abu, PA-C Orthopedic Surgery [phone] 01/01/2021, 10:06 AM

## 2021-01-01 NOTE — ED Provider Notes (Signed)
[facility] EMERGENCY DEPARTMENT Provider Note   CSN: [id number] Arrival date & time: 01/01/21  6301     History Chief Complaint  Patient presents with   Hip Pain   Fall    [name] is a 87 y.o. female.  The history is provided by the patient and the EMS personnel. The history is limited by the condition of the patient.  Hip Pain This is a new problem. The current episode started less than 1 hour ago. The problem occurs constantly. The problem has not changed since onset.Pertinent negatives include no chest pain, no abdominal pain, no headaches and no shortness of breath. Nothing aggravates the symptoms. Nothing relieves the symptoms. She has tried nothing for the symptoms. The treatment provided no relief.  Fall This is a new problem. The current episode started less than 1 hour ago. The problem occurs constantly. The problem has not changed since onset.Pertinent negatives include no chest pain, no abdominal pain, no headaches and no shortness of breath. Nothing aggravates the symptoms. Nothing relieves the symptoms. She has tried nothing for the symptoms. The treatment provided no relief.      History reviewed. No pertinent past medical history.  There are no problems to display for this patient.   History reviewed. No pertinent surgical history.   OB History   No obstetric history on file.     History reviewed. No pertinent family history.     Home Medications Prior to Admission medications   Not on File    Allergies    Patient has no known allergies.  Review of Systems   Review of Systems  Constitutional:  Negative for fever.  HENT:  Negative for facial swelling.   Eyes:  Negative for redness.  Respiratory:  Negative for shortness of breath, wheezing and stridor.   Cardiovascular:  Negative for chest pain.  Gastrointestinal:  Negative for abdominal pain and vomiting.  Genitourinary:  Negative for difficulty urinating.   Musculoskeletal:  Positive for arthralgias and joint swelling.  Skin:  Negative for rash.  Neurological:  Negative for facial asymmetry and headaches.  Psychiatric/Behavioral:  Negative for agitation.   All other systems reviewed and are negative.  Physical Exam Updated Vital Signs BP (!) 148/88   Pulse 92   Resp (!) 22   Ht 5\' 6"  (1.676 m)   Wt 49.9 kg   SpO2 96%   BMI 17.75 kg/m   Physical Exam Vitals and nursing note reviewed. Exam conducted with a chaperone present.  Constitutional:      General: She is not in acute distress.    Appearance: Normal appearance.  HENT:     Head: Normocephalic and atraumatic.     Nose: Nose normal.  Eyes:     Conjunctiva/sclera: Conjunctivae normal.     Pupils: Pupils are equal, round, and reactive to light.  Cardiovascular:     Rate and Rhythm: Normal rate. Rhythm irregular.     Pulses: Normal pulses.     Heart sounds: Normal heart sounds.  Pulmonary:     Effort: Pulmonary effort is normal.     Breath sounds: Normal breath sounds.  Abdominal:     General: Abdomen is flat. Bowel sounds are normal.     Palpations: Abdomen is soft.     Tenderness: There is no abdominal tenderness. There is no guarding.  Musculoskeletal:     Cervical back: Normal range of motion and neck supple.     Left hip: Deformity present.  Comments: L foreshortening and external rotation   Skin:    General: Skin is warm and dry.     Capillary Refill: Capillary refill takes less than 2 seconds.  Neurological:     General: No focal deficit present.     Mental Status: She is alert.     Deep Tendon Reflexes: Reflexes normal.  Psychiatric:        Mood and Affect: Mood normal.    ED Results / Procedures / Treatments   Labs (all labs ordered are listed, but only abnormal results are displayed) Results for orders placed or performed during the hospital encounter of 01/01/21  CBC  Result Value Ref Range   WBC 7.5 4.0 - 10.5 K/uL   RBC 4.46 3.87 - 5.11  MIL/uL   Hemoglobin 14.0 12.0 - 15.0 g/dL   HCT 45.0 36.0 - 46.0 %   MCV 100.9 (H) 80.0 - 100.0 fL   MCH 31.4 26.0 - 34.0 pg   MCHC 31.1 30.0 - 36.0 g/dL   RDW 15.6 (H) 11.5 - 15.5 %   Platelets 219 150 - 400 K/uL   nRBC 0.0 0.0 - 0.2 %  Protime-INR  Result Value Ref Range   Prothrombin Time 14.9 11.4 - 15.2 seconds   INR 1.2 0.8 - 1.2  I-Stat Chem 8, ED  Result Value Ref Range   Sodium 140 135 - 145 mmol/L   Potassium 4.5 3.5 - 5.1 mmol/L   Chloride 102 98 - 111 mmol/L   BUN 21 8 - 23 mg/dL   Creatinine, Ser 1.30 (H) 0.44 - 1.00 mg/dL   Glucose, Bld 107 (H) 70 - 99 mg/dL   Calcium, Ion 1.10 (L) 1.15 - 1.40 mmol/L   TCO2 28 22 - 32 mmol/L   Hemoglobin 15.3 (H) 12.0 - 15.0 g/dL   HCT 45.0 36.0 - 46.0 %  Sample to Blood Bank  Result Value Ref Range   Blood Bank Specimen SAMPLE AVAILABLE FOR TESTING    Sample Expiration      01/04/2021,2359 Performed at [facility], [address], [place], [place] [address]    No results found.   EKG None  Radiology No results found.  Procedures Procedures   Medications Ordered in ED Medications  fentaNYL (SUBLIMAZE) injection 50 mcg (50 mcg Intravenous Given 01/01/21 0510)  ondansetron (ZOFRAN) injection 4 mg (4 mg Intravenous Given 01/01/21 0510)    ED Course  I have reviewed the triage vital signs and the nursing notes.  Pertinent labs & imaging results that were available during my care of the patient were reviewed by me and considered in my medical decision making (see chart for details).   531 case d/w Dr. Delfino Lovett, may feed patient and hold blood thinners.    [facility] was evaluated in Emergency Department on 01/01/2021 for the symptoms described in the history of present illness. She was evaluated in the context of the global COVID-19 pandemic, which necessitated consideration that the patient might be at risk for infection with the SARS-CoV-2 virus that causes COVID-19. Institutional protocols and algorithms  that pertain to the evaluation of patients at risk for COVID-19 are in a state of rapid change based on information released by regulatory bodies including the CDC and federal and state organizations. These policies and algorithms were followed during the patient's care in the ED.  Final Clinical Impression(s) / ED Diagnoses Final diagnoses:  Fall  Closed fracture of left hip, initial encounter [facility])    Rx /  DC Orders ED Discharge Orders     None        Tye Juarez, MD 01/01/21 5784

## 2021-01-01 NOTE — Progress Notes (Signed)
Orthopedic Tech Progress Note Patient Details:  SHAWNI VOLKOV April 04, 1921 [id number]  Level 2 trauma  Patient ID: Mee Hives, female   DOB: 10/14/1920, 87 y.o.   MRN: [id number]  Janit Pagan 01/01/2021, 4:55 AM

## 2021-01-01 NOTE — ED Notes (Signed)
Notified Yates MD about pt HR and rhythm and about EKG obtained.

## 2021-01-01 NOTE — Progress Notes (Signed)
   01/01/21 1955  Assess: MEWS Score  Temp 98.3 F (36.8 C)  BP (!) 129/98  Pulse Rate (!) 129  Resp 18  SpO2 100 %  O2 Device Room Air  Assess: MEWS Score  MEWS Temp 0  MEWS Systolic 0  MEWS Pulse 2  MEWS RR 0  MEWS LOC 0  MEWS Score 2  MEWS Score Color Yellow  Assess: if the MEWS score is Yellow or Red  Were vital signs taken at a resting state? Yes  Focused Assessment Change from prior assessment (see assessment flowsheet)  Early Detection of Sepsis Score *See Row Information* Low  MEWS guidelines implemented *See Row Information* Yes  Treat  MEWS Interventions Administered prn meds/treatments  Pain Scale 0-10  Pain Score 10  Faces Pain Scale 6  Pain Type Acute pain  Pain Location Hip  Pain Orientation Left  Pain Descriptors / Indicators Aching;Throbbing  Pain Frequency Intermittent  Pain Onset On-going  Patients Stated Pain Goal 0  Pain Intervention(s) Medication (See eMAR);Repositioned;Cold applied;Elevated extremity  Multiple Pain Sites No  Take Vital Signs  Increase Vital Sign Frequency  Yellow: Q 2hr X 2 then Q 4hr X 2, if remains yellow, continue Q 4hrs  Escalate  MEWS: Escalate Yellow: discuss with charge nurse/RN and consider discussing with provider and RRT  Notify: Charge Nurse/RN  Name of Charge Nurse/RN Notified Izora Gala, RN  Date Charge Nurse/RN Notified 01/01/21  Time Charge Nurse/RN Notified 2014  Notify: Provider  Provider Name/Title Jeannette Corpus  Date Provider Notified 01/01/21  Time Provider Notified 2015  Notification Type Page  Notification Reason Change in status  Provider response Evaluate remotely  Document  Progress note created (see row info) Yes

## 2021-01-01 NOTE — Plan of Care (Signed)
Reviewed patient's chart.  She does appear to have a left intertrochanteric hip fracture after a fall at her SNF yesterday.  She has a recent admission for cholecystitis.  She is currently on Eliquis and was admitted the night prior, for which this dose was held.  At this point I believe cephalomedullary nail fixation is indicated predominantly in ambulation setting to assist with ambulation and hygiene.  Formal consult note to follow.  Please make n.p.o. at midnight in preparation for surgical fixation on September 24

## 2021-01-01 NOTE — Progress Notes (Signed)
   01/01/21 0440  Clinical Encounter Type  Visited With Patient  Visit Type Initial  Referral From Nurse  Consult/Referral To Chaplain  Spiritual Encounters  Spiritual Needs Prayer;Emotional  Stress Factors  Patient Stress Factors Loss of control  Family Stress Factors None identified  Advance Directives (For Healthcare)  Does Patient Have a Medical Advance Directive? Yes

## 2021-01-02 ENCOUNTER — Encounter (HOSPITAL_COMMUNITY): Payer: Self-pay | Admitting: Internal Medicine

## 2021-01-02 ENCOUNTER — Encounter (HOSPITAL_COMMUNITY)
Admission: EM | Disposition: A | Discharge status: Skilled Nursing Facility | Payer: Self-pay | Source: Skilled Nursing Facility | Attending: Internal Medicine

## 2021-01-02 ENCOUNTER — Inpatient Hospital Stay (HOSPITAL_COMMUNITY): Payer: PPO

## 2021-01-02 ENCOUNTER — Inpatient Hospital Stay (HOSPITAL_COMMUNITY): Payer: PPO | Admitting: Anesthesiology

## 2021-01-02 DIAGNOSIS — S72002A Fracture of unspecified part of neck of left femur, initial encounter for closed fracture: Secondary | ICD-10-CM

## 2021-01-02 HISTORY — PX: INTRAMEDULLARY (IM) NAIL INTERTROCHANTERIC: SHX5875

## 2021-01-02 LAB — CBC
HCT: 37.7 % (ref 36.0–46.0)
Hemoglobin: 12.2 g/dL (ref 12.0–15.0)
MCH: 32.2 pg (ref 26.0–34.0)
MCHC: 32.4 g/dL (ref 30.0–36.0)
MCV: 99.5 fL (ref 80.0–100.0)
Platelets: 187 10*3/uL (ref 150–400)
RBC: 3.79 MIL/uL — ABNORMAL LOW (ref 3.87–5.11)
RDW: 15.6 % — ABNORMAL HIGH (ref 11.5–15.5)
WBC: 9.1 10*3/uL (ref 4.0–10.5)
nRBC: 0 % (ref 0.0–0.2)

## 2021-01-02 LAB — BASIC METABOLIC PANEL
Anion gap: 8 (ref 5–15)
BUN: 24 mg/dL — ABNORMAL HIGH (ref 8–23)
CO2: 28 mmol/L (ref 22–32)
Calcium: 8.9 mg/dL (ref 8.9–10.3)
Chloride: 101 mmol/L (ref 98–111)
Creatinine, Ser: 1.29 mg/dL — ABNORMAL HIGH (ref 0.44–1.00)
GFR, Estimated: 38 mL/min — ABNORMAL LOW (ref >60)
Glucose, Bld: 103 mg/dL — ABNORMAL HIGH (ref 70–99)
Potassium: 4.8 mmol/L (ref 3.5–5.1)
Sodium: 137 mmol/L (ref 135–145)

## 2021-01-02 LAB — PROTIME-INR
INR: 1.3 — ABNORMAL HIGH (ref 0.8–1.2)
Prothrombin Time: 16.3 seconds — ABNORMAL HIGH (ref 11.4–15.2)

## 2021-01-02 SURGERY — FIXATION, FRACTURE, INTERTROCHANTERIC, WITH INTRAMEDULLARY ROD
Anesthesia: General | Laterality: Left

## 2021-01-02 MED ORDER — DILTIAZEM HCL 60 MG PO TABS
30.0000 mg | ORAL_TABLET | Freq: Three times a day (TID) | ORAL | Status: DC
  Administered 2021-01-02 – 2021-01-06 (×12): 30 mg via ORAL
  Filled 2021-01-02 (×12): qty 1

## 2021-01-02 MED ORDER — PHENYLEPHRINE HCL-NACL 20-0.9 MG/250ML-% IV SOLN
INTRAVENOUS | Status: DC | PRN
  Administered 2021-01-02: 60 ug/min via INTRAVENOUS

## 2021-01-02 MED ORDER — FENTANYL CITRATE (PF) 250 MCG/5ML IJ SOLN
INTRAMUSCULAR | Status: DC | PRN
  Administered 2021-01-02: 50 ug via INTRAVENOUS

## 2021-01-02 MED ORDER — DEXAMETHASONE SODIUM PHOSPHATE 10 MG/ML IJ SOLN
INTRAMUSCULAR | Status: DC | PRN
  Administered 2021-01-02: 5 mg via INTRAVENOUS

## 2021-01-02 MED ORDER — METOPROLOL TARTRATE 75 MG PO TABS: 75.0000 mg | ORAL_TABLET | Freq: Two times a day (BID) | ORAL | Status: DC

## 2021-01-02 MED ORDER — TRANEXAMIC ACID-NACL 1000-0.7 MG/100ML-% IV SOLN
INTRAVENOUS | Status: DC | PRN
  Administered 2021-01-02: 1000 mg via INTRAVENOUS

## 2021-01-02 MED ORDER — ACETAMINOPHEN 10 MG/ML IV SOLN
INTRAVENOUS | Status: DC | PRN
  Administered 2021-01-02: 750 mg via INTRAVENOUS

## 2021-01-02 MED ORDER — ROCURONIUM BROMIDE 10 MG/ML (PF) SYRINGE
PREFILLED_SYRINGE | INTRAVENOUS | Status: DC | PRN
  Administered 2021-01-02: 40 mg via INTRAVENOUS

## 2021-01-02 MED ORDER — LIDOCAINE HCL 2 % IJ SOLN
INTRAMUSCULAR | Status: AC
  Filled 2021-01-02: qty 20

## 2021-01-02 MED ORDER — ACETAMINOPHEN 10 MG/ML IV SOLN
INTRAVENOUS | Status: AC
  Filled 2021-01-02: qty 100

## 2021-01-02 MED ORDER — DEXAMETHASONE SODIUM PHOSPHATE 10 MG/ML IJ SOLN
INTRAMUSCULAR | Status: AC
  Filled 2021-01-02: qty 1

## 2021-01-02 MED ORDER — FENTANYL CITRATE (PF) 100 MCG/2ML IJ SOLN
INTRAMUSCULAR | Status: AC
  Administered 2021-01-02: 25 ug via INTRAVENOUS
  Filled 2021-01-02: qty 2

## 2021-01-02 MED ORDER — ONDANSETRON HCL 4 MG PO TABS
4.0000 mg | ORAL_TABLET | Freq: Every morning | ORAL | Status: DC
  Administered 2021-01-04 – 2021-01-06 (×3): 4 mg via ORAL
  Filled 2021-01-02 (×3): qty 1

## 2021-01-02 MED ORDER — LIDOCAINE HCL (PF) 2 % IJ SOLN
INTRAMUSCULAR | Status: AC
  Filled 2021-01-02: qty 5

## 2021-01-02 MED ORDER — LIDOCAINE 2% (20 MG/ML) 5 ML SYRINGE
INTRAMUSCULAR | Status: DC | PRN
Start: 2021-01-02 — End: 2021-01-02
  Administered 2021-01-02: 10 mg via INTRAVENOUS

## 2021-01-02 MED ORDER — PROPOFOL 10 MG/ML IV BOLUS
INTRAVENOUS | Status: DC | PRN
  Administered 2021-01-02: 70 mg via INTRAVENOUS

## 2021-01-02 MED ORDER — ENSURE ENLIVE PO LIQD
237.0000 mL | Freq: Three times a day (TID) | ORAL | Status: DC
  Administered 2021-01-02 – 2021-01-06 (×12): 237 mL via ORAL

## 2021-01-02 MED ORDER — VITAMIN B-12 1000 MCG PO TABS
1000.0000 ug | ORAL_TABLET | Freq: Every morning | ORAL | Status: DC
  Administered 2021-01-03 – 2021-01-06 (×4): 1000 ug via ORAL
  Filled 2021-01-02 (×4): qty 1

## 2021-01-02 MED ORDER — LIDOCAINE HCL URETHRAL/MUCOSAL 2 % EX GEL
CUTANEOUS | Status: AC
  Filled 2021-01-02: qty 11

## 2021-01-02 MED ORDER — 0.9 % SODIUM CHLORIDE (POUR BTL) OPTIME
TOPICAL | Status: DC | PRN
  Administered 2021-01-02: 1000 mL

## 2021-01-02 MED ORDER — TRANEXAMIC ACID-NACL 1000-0.7 MG/100ML-% IV SOLN
INTRAVENOUS | Status: AC
  Filled 2021-01-02: qty 100

## 2021-01-02 MED ORDER — ROCURONIUM BROMIDE 10 MG/ML (PF) SYRINGE
PREFILLED_SYRINGE | INTRAVENOUS | Status: AC
  Filled 2021-01-02: qty 10

## 2021-01-02 MED ORDER — PHENYLEPHRINE 40 MCG/ML (10ML) SYRINGE FOR IV PUSH (FOR BLOOD PRESSURE SUPPORT)
PREFILLED_SYRINGE | INTRAVENOUS | Status: AC
  Filled 2021-01-02: qty 10

## 2021-01-02 MED ORDER — FENTANYL CITRATE (PF) 100 MCG/2ML IJ SOLN
25.0000 ug | INTRAMUSCULAR | Status: DC | PRN
  Administered 2021-01-02 (×2): 25 ug via INTRAVENOUS

## 2021-01-02 MED ORDER — ONDANSETRON HCL 4 MG/2ML IJ SOLN
INTRAMUSCULAR | Status: AC
  Filled 2021-01-02: qty 2

## 2021-01-02 MED ORDER — APIXABAN 2.5 MG PO TABS
2.5000 mg | ORAL_TABLET | Freq: Two times a day (BID) | ORAL | Status: DC
  Administered 2021-01-02 – 2021-01-06 (×8): 2.5 mg via ORAL
  Filled 2021-01-02 (×10): qty 1

## 2021-01-02 MED ORDER — ACETAMINOPHEN 500 MG PO TABS: 1000.0000 mg | ORAL_TABLET | Freq: Once | ORAL | Status: DC

## 2021-01-02 MED ORDER — PROPOFOL 10 MG/ML IV BOLUS
INTRAVENOUS | Status: AC
  Filled 2021-01-02: qty 20

## 2021-01-02 MED ORDER — SUGAMMADEX SODIUM 200 MG/2ML IV SOLN
INTRAVENOUS | Status: DC | PRN
  Administered 2021-01-02 (×2): 100 mg via INTRAVENOUS

## 2021-01-02 MED ORDER — FENTANYL CITRATE (PF) 250 MCG/5ML IJ SOLN
INTRAMUSCULAR | Status: AC
  Filled 2021-01-02: qty 5

## 2021-01-02 MED ORDER — PHENYLEPHRINE 40 MCG/ML (10ML) SYRINGE FOR IV PUSH (FOR BLOOD PRESSURE SUPPORT)
PREFILLED_SYRINGE | INTRAVENOUS | Status: DC | PRN
Start: 2021-01-02 — End: 2021-01-02
  Administered 2021-01-02: 200 ug via INTRAVENOUS
  Administered 2021-01-02: 120 ug via INTRAVENOUS

## 2021-01-02 MED ORDER — ONDANSETRON HCL 4 MG/2ML IJ SOLN
INTRAMUSCULAR | Status: DC | PRN
  Administered 2021-01-02: 4 mg via INTRAVENOUS

## 2021-01-02 MED ORDER — ADULT MULTIVITAMIN W/MINERALS CH
1.0000 | ORAL_TABLET | Freq: Every day | ORAL | Status: DC
  Administered 2021-01-02 – 2021-01-06 (×5): 1 via ORAL
  Filled 2021-01-02 (×5): qty 1

## 2021-01-02 MED ORDER — APIXABAN 2.5 MG PO TABS: 2.5000 mg | ORAL_TABLET | Freq: Three times a day (TID) | ORAL | Status: DC

## 2021-01-02 MED ORDER — ADULT MULTIVITAMIN W/MINERALS CH: 1.0000 | ORAL_TABLET | Freq: Every morning | ORAL | Status: DC

## 2021-01-02 SURGICAL SUPPLY — 53 items
BAG COUNTER SPONGE SURGICOUNT (BAG) ×2 IMPLANT
BIT DRILL 4.3MMS DISTAL GRDTED (BIT) IMPLANT
BNDG COHESIVE 6X5 TAN STRL LF (GAUZE/BANDAGES/DRESSINGS) IMPLANT
BRUSH SCRUB EZ PLAIN DRY (MISCELLANEOUS) ×3 IMPLANT
CORTICAL BONE SCR 5.0MM X 46MM (Screw) ×2 IMPLANT
COVER PERINEAL POST (MISCELLANEOUS) ×2 IMPLANT
COVER SURGICAL LIGHT HANDLE (MISCELLANEOUS) ×3 IMPLANT
DRAPE C-ARMOR (DRAPES) ×2 IMPLANT
DRAPE HALF SHEET 40X57 (DRAPES) ×1 IMPLANT
DRAPE ORTHO SPLIT 77X108 STRL (DRAPES)
DRAPE STERI IOBAN 125X83 (DRAPES) ×2 IMPLANT
DRAPE SURG ORHT 6 SPLT 77X108 (DRAPES) IMPLANT
DRAPE U-SHAPE 47X51 STRL (DRAPES) ×2 IMPLANT
DRILL 4.3MMS DISTAL GRADUATED (BIT) ×2
DRSG AQUACEL AG ADV 3.5X 4 (GAUZE/BANDAGES/DRESSINGS) ×1 IMPLANT
DRSG AQUACEL AG ADV 3.5X 6 (GAUZE/BANDAGES/DRESSINGS) ×1 IMPLANT
DRSG EMULSION OIL 3X3 NADH (GAUZE/BANDAGES/DRESSINGS) ×1 IMPLANT
DRSG MEPILEX BORDER 4X4 (GAUZE/BANDAGES/DRESSINGS) ×1 IMPLANT
DRSG MEPILEX BORDER 4X8 (GAUZE/BANDAGES/DRESSINGS) ×1 IMPLANT
ELECT REM PT RETURN 9FT ADLT (ELECTROSURGICAL) ×2
ELECTRODE REM PT RTRN 9FT ADLT (ELECTROSURGICAL) ×1 IMPLANT
GLOVE SRG 8 PF TXTR STRL LF DI (GLOVE) ×1 IMPLANT
GLOVE SURG ENC MOIS LTX SZ7.5 (GLOVE) ×2 IMPLANT
GLOVE SURG ENC MOIS LTX SZ8 (GLOVE) ×2 IMPLANT
GLOVE SURG UNDER POLY LF SZ7.5 (GLOVE) ×2 IMPLANT
GLOVE SURG UNDER POLY LF SZ8 (GLOVE) ×1
GOWN STRL REUS W/ TWL LRG LVL3 (GOWN DISPOSABLE) ×2 IMPLANT
GOWN STRL REUS W/ TWL XL LVL3 (GOWN DISPOSABLE) ×1 IMPLANT
GOWN STRL REUS W/TWL LRG LVL3 (GOWN DISPOSABLE) ×4
GOWN STRL REUS W/TWL XL LVL3 (GOWN DISPOSABLE) ×1
GUIDEPIN VERSANAIL DSP 3.2X444 (ORTHOPEDIC DISPOSABLE SUPPLIES) ×1 IMPLANT
GUIDEWIRE BALL NOSE 100CM (WIRE) ×1 IMPLANT
HFN LH 130 DEG 11MM X 380MM (Orthopedic Implant) ×1 IMPLANT
KIT BASIN OR (CUSTOM PROCEDURE TRAY) ×2 IMPLANT
KIT TURNOVER KIT B (KITS) ×2 IMPLANT
MANIFOLD NEPTUNE II (INSTRUMENTS) ×2 IMPLANT
NS IRRIG 1000ML POUR BTL (IV SOLUTION) ×2 IMPLANT
PACK GENERAL/GYN (CUSTOM PROCEDURE TRAY) ×2 IMPLANT
PAD ARMBOARD 7.5X6 YLW CONV (MISCELLANEOUS) ×4 IMPLANT
SCREW CORTICL BON 5.0MM X 46MM (Screw) IMPLANT
SCREW LAG 10.5MMX105MM HFN (Screw) ×1 IMPLANT
STAPLER VISISTAT 35W (STAPLE) ×2 IMPLANT
STOCKINETTE IMPERVIOUS LG (DRAPES) IMPLANT
SUT ETHILON 2 0 FS 18 (SUTURE) ×1 IMPLANT
SUT VIC AB 0 CT1 27 (SUTURE) ×2
SUT VIC AB 0 CT1 27XBRD ANBCTR (SUTURE) ×1 IMPLANT
SUT VIC AB 1 CT1 27 (SUTURE) ×2
SUT VIC AB 1 CT1 27XBRD ANBCTR (SUTURE) ×1 IMPLANT
SUT VIC AB 2-0 CT1 27 (SUTURE) ×2
SUT VIC AB 2-0 CT1 TAPERPNT 27 (SUTURE) ×1 IMPLANT
TOWEL GREEN STERILE (TOWEL DISPOSABLE) ×4 IMPLANT
TOWEL GREEN STERILE FF (TOWEL DISPOSABLE) ×2 IMPLANT
WATER STERILE IRR 1000ML POUR (IV SOLUTION) ×2 IMPLANT

## 2021-01-02 NOTE — Brief Op Note (Signed)
   Brief Op Note  Date of Surgery: 01/02/2021  Preoperative Diagnosis: Left Hip Fx  Postoperative Diagnosis: same  Procedure: Procedure(s): INTRAMEDULLARY (IM) NAIL INTERTROCHANTRIC  Implants: Implant Name Type Inv. Item Serial No. Manufacturer Lot No. LRB No. Used Action  HFN LH 130 DEG 11MM X 380MM - [id number] Orthopedic Implant HFN LH 130 DEG 11MM X 380MM  [employer]IMMER [employer](ORTH,TRAU,BIO,SG) [id number] Left 1 Implanted  SCREW LAG 10.5MMX105MM HFN - [id number] Screw SCREW LAG 10.5MMX105MM HFN  ZIMMER [employer]ON(ORTH,TRAU,BIO,SG) [id number] Left 1 Implanted  CORTICAL BONE SCR 5.0MM X 46MM - [id number] Screw CORTICAL BONE SCR 5.0MM X 46MM  ZIMMER [employer]ON(ORTH,TRAU,BIO,SG) [id number] Left 1 Implanted    Surgeons: Surgeon(s): Vanetta Mulders, MD  Anesthesia: General    Estimated Blood Loss: See anesthesia record  Complications: None  Condition to PACU: Stable  Yevonne Pax, MD 01/02/2021 9:14 AM

## 2021-01-02 NOTE — Progress Notes (Signed)
PROGRESS NOTE  ARVADA SEABORN NAT:[id number] DOB: [birth year] DOA: 01/01/2021 PCP: Default, Provider, MD  Brief History   [name] is a 87 y.o. female with medical history significant of CAD s/p stents; HTN; HLD; afib on Eliquis; and hypothyroidism presenting with a fall.  She was recently hospitalized with cholecystitis (11/2020) and discharged to [facility].  She reports that she got up overnight to use the bathroom and her legs buckled underneath her.  Her left leg has throbbing intermittent pain.  She is supposed to use a walker but does not always use it and was not using it last night.  Her son would prefer that she not take medications, particularly ones that she does not need.  He is in agreement with stopping Eliquis.   87 year old female presents after a fall.  She has a left hip fracture and is on Eliquis.  Dr. Lyla Glassing was consulted by EDP and recommended to hold off Eliquis for the next 24 to 36 hours.  Patient received Eliquis 8 hours prior to presentation.  Per orthopedic surgery patient can eat today.    The patient was admitted to a telemetry bed. She was made NPO after midnight and underwent Long cephalomedullary nailing of the left hip this morning. She has tolerated the procedure well.  Consultants  Orthopedic surgery  Procedures  Long cephalomedullary naling of left hip.  Antibiotics   Anti-infectives (From admission, onward)    Start     Dose/Rate Route Frequency Ordered Stop   01/02/21 0600  ceFAZolin (ANCEF) IVPB 2g/100 mL premix        2 g 200 mL/hr over 30 Minutes Intravenous To Short Stay 01/01/21 2244 01/02/21 0758   01/01/21 1100  trimethoprim (TRIMPEX) tablet 100 mg        100 mg Oral Every morning 01/01/21 1052        Subjective  The patient is somewhat somnolent as she has just returned from surgery.  Objective   Vitals:  Vitals:   01/02/21 1013 01/02/21 1032  BP: (!) 137/94 (!) 155/91  Pulse:  100  Resp: 19 18  Temp: (!) 97.2 F  (36.2 C) (!) 97.5 F (36.4 C)  SpO2: 99% 100%    Exam:  Constitutional:  The patient is resting comfortably. No new complaints.  Respiratory:  CTA bilaterally, no w/r/r.  Respiratory effort normal. No retractions or accessory muscle use Cardiovascular:  RRR, no m/r/g No LE extremity edema   Normal pedal pulses Abdomen:  Abdomen appears normal; no tenderness or masses No hernias No HSM Musculoskeletal:  Digits/nails BUE: no clubbing, cyanosis, petechiae, infection exam of joints, bones, muscles of at least one of following: head/neck, RUE, LUE, RLE, LLE   Skin:  No rashes, lesions, ulcers palpation of skin: no induration or nodules Neurologic:  Patient is unable to cooperate with exam. Psychiatric:  Patient is unable to cooperate with exam.   I have personally reviewed the following:   Today's Data  Vitals.  Lab Data  BMP, CBC, INR  Imaging  X-ray of femur  Cardiology Data  EKG  Scheduled Meds:  atorvastatin  10 mg Oral QHS   docusate sodium  100 mg Oral BID   feeding supplement  237 mL Oral TID BM   levothyroxine  100 mcg Oral Q0600   metoprolol succinate  50 mg Oral Daily   multivitamin with minerals  1 tablet Oral Daily   trimethoprim  100 mg Oral q morning   Continuous Infusions:  methocarbamol (  ROBAXIN) IV      Principal Problem:   Hip fracture, left (HCC) Active Problems:   CAD (coronary artery disease), native coronary artery   Dyslipidemia   Hypertension   Hypothyroidism (acquired)   PAF (paroxysmal atrial fibrillation) ([place])   LOS: 1 day   A & P  Hip fracture -Mechanical fall resulting in hip fracture -Orthopedics consulted; S/P intramedullary nailing of right hip.  -Pain control with Robxain, Vicodin, and Morphine prn -TOC consult for rehab placement - she is coming from and returning to [employer] -Will need PT consult post-operatively -Hip fracture order set utilized   Cholecystocolonic fistula, suspected; Acalculus  cholecystitis -Recently admitted for this issue -s/p antibiotics -MRCP during last admission with suspected cholecystocolonic fistula not well demonstrated by MR, but notable diffuse gallbladder wall thickening which could reflect a calculus cholecystitis, mild extrahepatic biliary dilation without evidence of choledocholithiasis.  -HIDA scan notable for no filling within the gallbladder but patent common bile duct.   -She was treated with antibiotics -Perc drain was considered but family preferred avoidance of aggressive measures if possible -Appears to be generally stable at this time   Stage 3a-b CKD -Appears to be nearing the transition from 3a to 3b -Stable at this time -Will continue to follow   PVD -Proximal SMA stenosis and L SFA origin stenosis noted during last hospitalization -Family prefers conservative care -Continue Lipitor for now, but could consider stopping based on ongoing family discussions   Afib -Rate controlled with Dilt, Metoprolol  -Will hold Diltiazem for now and monitor -Will continue metoprolol but change to Toprol XL 50 mg daily and can increase if needed for HR/BP -Eliquis restarted   Hypothyroidism -Continue Synthroid at current dose for now    Urinary issues -Hold myrbetriq for now, consider d/c -Continue trimethoprim daily for UTI prophylaxis   I have seen and examined this patient myself. I have spent 32 minutes in her evaluation and care.  DNR -Out of facility DNR form is prominently displayed at the bedside  Note: This patient has been tested and is negative for the novel coronavirus COVID-19. The patient has been fully vaccinated against COVID-19.   Level of care: Med-Surg DVT prophylaxis:  SCDs until approved for Lovenox by orthopedics Code Status:  DNR - confirmed with patient/family Family Communication: None present initially, but I spoke with her son at the bedside later in the morning Disposition Plan:  SNF once clinically  improved   Niels Cranshaw, DO [facility] Direct contact: see [email or web address]  7PM-7AM contact night coverage as above 01/02/2021, 3:46 PM  LOS: 1 day

## 2021-01-02 NOTE — Anesthesia Postprocedure Evaluation (Signed)
Anesthesia Post Note  Patient: [name]  Procedure(s) Performed: INTRAMEDULLARY (IM) NAIL INTERTROCHANTRIC (Left)     Patient location during evaluation: PACU Anesthesia Type: General Level of consciousness: awake and alert, patient cooperative and oriented Pain management: pain level controlled Vital Signs Assessment: post-procedure vital signs reviewed and stable Respiratory status: spontaneous breathing, nonlabored ventilation and respiratory function stable Cardiovascular status: blood pressure returned to baseline and stable Postop Assessment: no apparent nausea or vomiting Anesthetic complications: no   No notable events documented.  Last Vitals:  Vitals:   01/02/21 0727 01/02/21 0928  BP:  (!) 136/99  Pulse: (!) 55 64  Resp: 13 (!) 23  Temp:  (!) 36.1 C  SpO2: 100% 95%    Last Pain:  Vitals:   01/02/21 0928  TempSrc:   PainSc: 0-No pain                 Cala Kruckenberg,E. Emory Leaver

## 2021-01-02 NOTE — Transfer of Care (Signed)
Immediate Anesthesia Transfer of Care Note  Patient: [name]  Procedure(s) Performed: INTRAMEDULLARY (IM) NAIL INTERTROCHANTRIC (Left)  Patient Location: PACU  Anesthesia Type:General  Level of Consciousness: awake  Airway & Oxygen Therapy: Patient Spontanous Breathing and Patient connected to nasal cannula oxygen  Post-op Assessment: Report given to RN and Post -op Vital signs reviewed and stable  Post vital signs: Reviewed and stable  Last Vitals:  Vitals Value Taken Time  BP 136/99 01/02/21 0928  Temp    Pulse 43 01/02/21 0928  Resp 14 01/02/21 0932  SpO2 99 % 01/02/21 0928  Vitals shown include unvalidated device data.  Last Pain:  Vitals:   01/02/21 0634  TempSrc:   PainSc: Asleep      Patients Stated Pain Goal: 0 (20/03/79 4446)  Complications: No notable events documented.

## 2021-01-02 NOTE — Progress Notes (Signed)
Pt to OR.

## 2021-01-02 NOTE — Progress Notes (Signed)
Initial Nutrition Assessment  DOCUMENTATION CODES:   Underweight  INTERVENTION:   -Once diet is advanced, add:   -Ensure Enlive po TID, each supplement provides 350 kcal and 20 grams of protein  -MVI with minerals daily  NUTRITION DIAGNOSIS:   Increased nutrient needs related to post-op healing as evidenced by estimated needs.  GOAL:   Patient will meet greater than or equal to 90% of their needs  MONITOR:   PO intake, Supplement acceptance, Diet advancement, Labs, Weight trends, Skin, I & O's  REASON FOR ASSESSMENT:   Consult Assessment of nutrition requirement/status, Hip fracture protocol  ASSESSMENT:   [name] is a 87 y.o. female with medical history significant of CAD s/p stents; HTN; HLD; afib on Eliquis; and hypothyroidism presenting with a fall.  She was recently hospitalized with cholecystitis (11/2020) and discharged to [facility].  She reports that she got up overnight to use the bathroom and her legs buckled underneath her.  Her left leg has throbbing intermittent pain.  She is supposed to use a walker but does not always use it and was not using it last night.  Her son would prefer that she not take medications, particularly ones that she does not need.  He is in agreement with stopping Eliquis.  Pt admitted with lt hip fracture s/p mechanical fall.   Reviewed I/O's: +385 ml x 24 hours  UOP: 0 ml x 24 hours  Per orthopedics notes, plan for intramedullary nailing today. Pt is currently NPO for procedure.   Pt unavailable at time of visit. RD unable to obtain further nutrition-related history or complete nutrition-focused physical exam at this time.    Pt with increased nutritional needs for post-operative healing and would benefit from addition of oral nutrition supplements. Pt is at high risk for malnutrition given advanced age and underweight status, however, RD unable to identify at this time.   Medications reviewed and include colace and  lactated ringers infusion @ 75 ml/hr.   Labs reviewed.   Diet Order:   Diet Order             Diet NPO time specified Except for: Sips with Meds  Diet effective midnight                   EDUCATION NEEDS:   No education needs have been identified at this time  Skin:  Skin Assessment: Reviewed RN Assessment  Last BM:  Unknown  Height:   Ht Readings from Last 1 Encounters:  01/01/21 5\' 6"  (1.676 m)    Weight:   Wt Readings from Last 1 Encounters:  01/01/21 49.9 kg    Ideal Body Weight:  59.1 kg  BMI:  Body mass index is 17.75 kg/m.  Estimated Nutritional Needs:   Kcal:  1500-1700  Protein:  70-85 grams  Fluid:  > 1.5 L    Loistine Chance, RD, LDN, [place] Registered Dietitian II Certified Diabetes Care and Education Specialist Please refer to [facility] for RD and/or RD on-call/weekend/after hours pager

## 2021-01-02 NOTE — Anesthesia Procedure Notes (Addendum)
Procedure Name: Intubation Date/Time: 01/02/2021 7:42 AM Performed by: Reece Agar, CRNA Pre-anesthesia Checklist: Patient identified, Emergency Drugs available, Suction available and Patient being monitored Patient Re-evaluated:Patient Re-evaluated prior to induction Oxygen Delivery Method: Circle system utilized Preoxygenation: Pre-oxygenation with 100% oxygen Induction Type: IV induction Ventilation: Mask ventilation without difficulty Laryngoscope Size: Mac and 3 Grade View: Grade I Tube type: Oral Tube size: 7.0 mm Number of attempts: 1 Airway Equipment and Method: Stylet Placement Confirmation: ETT inserted through vocal cords under direct vision, positive ETCO2 and breath sounds checked- equal and bilateral Secured at: 21 cm Tube secured with: Tape Dental Injury: Teeth and Oropharynx as per pre-operative assessment

## 2021-01-02 NOTE — Op Note (Signed)
Date of Surgery: 01/02/2021  INDICATIONS: [name] is a 87 y.o.-year-old female with left intertrochanteric hip fracture in the setting of a fall.  I saw her and spoke with her son [name] discussed the risks and benefits which is documented in my consult while.  The risk and benefits of the procedure with discussed in detail and documented in the pre-operative evaluation.  PREOPERATIVE DIAGNOSIS: Left displaced intertrochanteric hip fracture  POSTOPERATIVE DIAGNOSIS: Same.  PROCEDURE: Left hip long cephalomedullary nail  SURGEON: Yevonne Pax MD  ASSISTANT: Shirley Friar, ATC; necessary for the timely completion of procedure and due to complexity of procedure.  ANESTHESIA:  general  IV FLUIDS AND URINE: See anesthesia record.  ANTIBIOTICS: Ancef 2 g  ESTIMATED BLOOD LOSS: 25 mL.  IMPLANTS:  Implant Name Type Inv. Item Serial No. Manufacturer Lot No. LRB No. Used Action  HFN LH 130 DEG 11MM X 380MM - [id number] Orthopedic Implant HFN LH 130 DEG 11MM X 380MM  [employer]ON(ORTH,TRAU,BIO,SG) [id number] Left 1 Implanted  SCREW LAG 10.5MMX105MM HFN - [id number] Screw SCREW LAG 10.5MMX105MM HFN  [employer]MER [employer]ON(ORTH,TRAU,BIO,SG) [id number] Left 1 Implanted  CORTICAL BONE SCR 5.0MM X 46MM - [id number] Screw CORTICAL BONE SCR 5.0MM X 46MM  ZIMMER RECON(ORTH,TRAU,BIO,SG) [id number] Left 1 Implanted    DRAINS: None  CULTURES: None  COMPLICATIONS: none  DESCRIPTION OF PROCEDURE:  The patient was identified in the preoperative holding area.  A universal timeout was performed according to protocol and the correct left side was marked.  Again conversation was had with blood to confirm their desire to proceed with cephalomedullary nail fixation.  The patient was taken back to the operating room.  Anesthesia was induced.  We placed the left leg in a fracture bed and traction.  Care was taken to protect the foot with padding.  Traction was performed with internal rotation which showed a well reduced  fracture reduction.  A bump was placed under the left hip and elevated.  Right side was laying normally on the bed.  After adequate fracture reduction was confirmed with both AP and lateral, the leg was prepped and draped in the usual sterile fashion.  Again a timeout was held according to universal protocol.   We marked out the greater tuberosity and made an incision along the posterior lateral aspect of this approximately 5 cm proximal to the tip of the greater tuberosity.  Incision was made sharply down to the level of the IT band this was opened up.  We palpated the tip of the greater trochanter and a guidepin was placed at the tip of the greater trochanter.  We advance this slowly with a mallet.  We confirmed guidepin placement on AP and lateral.  We are happy with this.  We subsequently performed opening reaming.  A ball-tipped guidewire was then passed the length of the femur and confirmed on AP and lateral radiographs.  We subsequently measured the nail size which is documented above.  We reamed with a size 13 reamer and placed an 11 nail.  We then placed the head neck pin through the guide.  This was again confirmed to be as center center as possible.  The pin was slightly posterior on the lateral which we were okay with as this was the best bone.  We measured off of this pen and subsequently drilled to a depth of 105 mm.  105 mm lag screw was then placed.  We compressed through this with excellent reduction and compression of the fracture fragment.  We also utilized a percutaneous bone hook around the anterior aspect of the femur to assist with fracture reduction and maintenance.  We confirmed a excellent reduction with good head neck angle.  We subsequently performed distal interlocks using perfect circle technique.  A small incision was made about the distal aspect.  We drilled through the static hole.  We then measured and placed 1 static distal interlock.  This was again confirmed to be in the  hole on AP and lateral.   The wounds were thoroughly irrigated and closed in layers using 0 Vicryl for the IT band 2-0 Vicryl for subcutaneous tissue and staples for skin.  Sterile dressings were applied.  All counts were correct at the end of the case.  The patient was awoke and taken to the PACU without complication.  Shirley Friar ATC was necessary for opening, closing, retracting, limb positioning and overall facilitation and timely completion of the procedure.     POSTOPERATIVE PLAN: She will be weightbearing as tolerated on the left leg.  She will be seen and mobilized with physical therapy.  She will likely need placement given her mobility limitations.  Yevonne Pax, MD 9:16 AM

## 2021-01-02 NOTE — H&P (View-Only) (Signed)
ORTHOPAEDIC CONSULTATION  REQUESTING PHYSICIAN: Swayze, Ava, DO  Chief Complaint: left hip pain  HPI: [name] is a 87 y.o. female who presents with left hip pain after a fall from standing.  Of note the majority of the history was collected via her son [name].  Reportedly she is a baseline limited ambulator but is able to ambulate without difficulty with the use of a walker.  She initially presented to the emergency room.  She does have a history of A. fib on Eliquis although this has been now 2 days since she last taken it.  She currently has significant pain in the left hip and is not able to put any weight on this.  Past Medical History:  Diagnosis Date   CAD (coronary artery disease), native coronary artery    s/p stents   Dyslipidemia    Hypertension    Hypothyroidism (acquired)    PAF (paroxysmal atrial fibrillation) ([place])    on Eliquis   History reviewed. No pertinent surgical history. Social History   Socioeconomic History   Marital status: Unknown    Spouse name: Not on file   Number of children: Not on file   Years of education: Not on file   Highest education level: Not on file  Occupational History   Not on file  Tobacco Use   Smoking status: Never   Smokeless tobacco: Never  Substance and Sexual Activity   Alcohol use: Not Currently   Drug use: Never   Sexual activity: Not on file  Other Topics Concern   Not on file  Social History Narrative   Not on file   Social Determinants of Health   Financial Resource Strain: Not on file  Food Insecurity: Not on file  Transportation Needs: Not on file  Physical Activity: Not on file  Stress: Not on file  Social Connections: Not on file   History reviewed. No pertinent family history. - negative except otherwise stated in the family history section No Known Allergies Prior to Admission medications   Medication Sig Start Date End Date Taking? Authorizing Provider  acetaminophen (TYLENOL) 500 MG tablet  Take 500 mg by mouth 2 (two) times daily.   Yes [provider]  apixaban (ELIQUIS) 2.5 MG TABS tablet Take 2.5 mg by mouth 3 (three) times daily. 8am, 2pm, 8pm   Yes [provider]  atorvastatin (LIPITOR) 10 MG tablet Take 10 mg by mouth at bedtime. 12/31/20  Yes [provider]  diltiazem (CARDIZEM) 30 MG tablet Take 30 mg by mouth 3 (three) times daily. 8am, 2pm, 8pm 12/23/20  Yes [provider]  Emollient (EUCERIN) lotion Apply topically See admin instructions. Apply topically to both legs daily at bedtime   Yes [provider]  furosemide (LASIX) 20 MG tablet Take 20 mg by mouth every Monday, Wednesday, and Friday. 12/24/20  Yes [provider]  levothyroxine (SYNTHROID) 100 MCG tablet Take 100 mcg by mouth daily at 6 (six) AM. 12/31/20  Yes [provider]  magnesium citrate SOLN Take 1 Bottle by mouth daily as needed (constipation).   Yes [provider]  Metoprolol Tartrate 75 MG TABS Take 75 mg by mouth 2 (two) times daily.   Yes [provider]  mirabegron ER (MYRBETRIQ) 50 MG TB24 tablet Take 50 mg by mouth at bedtime.   Yes [provider]  Multiple Vitamin (MULTIVITAMIN WITH MINERALS) TABS tablet Take 1 tablet by mouth every morning.   Yes [provider]  nitroGLYCERIN (NITROSTAT) 0.4 MG SL tablet Place 0.4 mg under the tongue every 5 (five) minutes as needed for chest pain.   Yes [provider]  ondansetron (ZOFRAN) 4 MG tablet Take 4 mg by mouth every morning. 12/23/20  Yes [provider]  polyethylene glycol (MIRALAX / GLYCOLAX) 17 g packet Take 17 g by mouth every 12 (twelve) hours as needed (constipation).   Yes [provider]  Probiotic Product (PROBIOTIC-10 ULTIMATE) CAPS Take 1 capsule by mouth every morning.   Yes [provider]  senna-docusate (SENOKOT-S) 8.6-50 MG tablet Take 1 tablet by mouth every 12 (twelve) hours as needed (constipation).    Yes [provider]  trimethoprim (TRIMPEX) 100 MG tablet Take 100 mg by mouth every morning. 12/31/20  Yes [provider]  vitamin B-12 (CYANOCOBALAMIN) 1000 MCG tablet Take 1,000 mcg by mouth every morning.   Yes [provider]   CT HEAD WO CONTRAST  Result Date: 01/01/2021 CLINICAL DATA:  87 year old female status post fall with hip fracture. On blood thinners. EXAM: CT HEAD WITHOUT CONTRAST TECHNIQUE: Contiguous axial images were obtained from the base of the skull through the vertex without intravenous contrast. COMPARISON:  Brain MRI 08/06/2020.  Head CT 08/05/2020. FINDINGS: Brain: Stable cerebral volume. No midline shift, mass effect, or evidence of intracranial mass lesion. Stable ventricle size and configuration. Stable patchy and confluent bilateral white matter hypodensity, tracking into the deep white matter capsules and thalamus on the left. Stable mild heterogeneity in the brainstem. No acute intracranial hemorrhage identified. No cortically based acute infarct identified. Vascular: Calcified atherosclerosis at the skull base. No suspicious intracranial vascular hyperdensity. 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 No skull fracture identified. Sinuses/Orbits: Mildly increased mucosal thickening and bubbly opacity in the left maxillary and bilateral sphenoid sinuses since April. Tympanic cavities and mastoids remain clear. Other: No acute orbit or scalp soft tissue injury. IMPRESSION: 1. No acute intracranial abnormality or acute traumatic injury identified. 2. Stable CT appearance of chronic small vessel disease. 3. Mildly increased paranasal sinus inflammation since April. Electronically Signed   By: Genevie Ann M.D.   On: 01/01/2021 05:36   CT CERVICAL SPINE WO CONTRAST  Result Date: 01/01/2021 CLINICAL DATA:  87 year old female status post fall with hip fracture. On blood thinners. EXAM: CT CERVICAL SPINE WITHOUT CONTRAST  TECHNIQUE: Multidetector CT imaging of the cervical spine was performed without intravenous contrast. Multiplanar CT image reconstructions were also generated. COMPARISON:  Head CT today. Cervical spine CT 08/03/2020. FINDINGS: Alignment: Stable since April. Chronic anterolisthesis of C4 on C5 and C7 on T1. Bilateral posterior element alignment is within normal limits. Skull base and vertebrae: Visualized skull base is intact. No atlanto-occipital dissociation. C1 and C2 are chronically degenerated but appears stable and intact. Congenital incomplete ossification of the posterior C1 ring. No acute osseous abnormality identified. Soft tissues and spinal canal: No prevertebral fluid or swelling. No visible canal hematoma. Advanced bilateral cervical carotid calcified atherosclerosis. Disc levels: Chronic severe C1-C2 degeneration including bulky odontoid subchondral cysts, partially calcified ligamentous hypertrophy. Widespread severe facet degeneration. Advanced calcified disc degeneration at C4-C5 in the setting of spondylolisthesis with chronic spinal stenosis there. Bulky chronic ligament flavum hypertrophy and calcification at the cervicothoracic junction, also with a degree of spinal stenosis. Upper chest: Stable. IMPRESSION: 1. No acute traumatic injury identified in the cervical spine. 2. Chronic severe cervical spine degeneration appears stable. 3. Advanced carotid atherosclerosis. Electronically Signed   By: Herminio Heads.D.  On: 01/01/2021 05:40   DG Chest Port 1 View  Result Date: 01/01/2021 CLINICAL DATA:  87 year old female status post fall with hip fracture. On blood thinners. EXAM: PORTABLE CHEST 1 VIEW COMPARISON:  Portable chest 05/30/2020 and earlier, including CTA abdomen 11/26/2020. FINDINGS: Portable AP semi upright view at 0445 hours. Lower lung volumes. Stable mild cardiomegaly. Calcified aortic atherosclerosis. Other mediastinal contours are within normal limits. Visualized tracheal air  column is within normal limits. Stable elevation of the right hemidiaphragm. Allowing for portable technique the lungs are clear. No pneumothorax or pleural effusion. Osteopenia. Chronic deformity proximal left humerus. No acute osseous abnormality identified. IMPRESSION: No acute cardiopulmonary abnormality. Electronically Signed   By: Genevie Ann M.D.   On: 01/01/2021 05:32   DG Knee Complete 4 Views Left  Result Date: 01/01/2021 CLINICAL DATA:  87 year old female status post fall with left hip fracture. EXAM: LEFT KNEE - 3 VIEW COMPARISON:  Left hip series today. FINDINGS: Each of these three views is oblique due to difficulty with patient positioning in the setting of hip fracture. Advanced calcified peripheral vascular disease. Bone mineralization is within normal limits for age. Distal femur, patella, proximal tibia and fibula appear intact. Joint spaces appear normal for age. No joint effusion is evident. IMPRESSION: 1. No acute fracture or dislocation identified about the left knee. 2. Advanced calcified peripheral vascular disease. Electronically Signed   By: Genevie Ann M.D.   On: 01/01/2021 07:37   DG HIP PORT UNILAT WITH PELVIS 1V LEFT  Result Date: 01/01/2021 CLINICAL DATA:  87 year old female status post fall. On blood thinners. EXAM: DG HIP (WITH OR WITHOUT PELVIS) 1V PORT LEFT COMPARISON:  CTA abdomen and pelvis 11/26/2020. FINDINGS: Portable AP and cross-table lateral views of the pelvis and left hip. Acute left femur intertrochanteric fracture with mild varus impaction. Left femoral head remains normally located. Pelvis appears to remain intact. Grossly intact proximal right femur. Extensive iliofemoral calcified atherosclerosis. Nonobstructed visible bowel gas pattern. IMPRESSION: Acute left femur intertrochanteric fracture with mild varus impaction. Electronically Signed   By: Genevie Ann M.D.   On: 01/01/2021 05:30     Positive ROS: All other systems have been reviewed and were otherwise negative  with the exception of those mentioned in the HPI and as above.  Physical Exam: General: No acute distress Cardiovascular: No pedal edema Respiratory: No cyanosis, no use of accessory musculature GI: No organomegaly, abdomen is soft and non-tender Skin: No lesions in the area of chief complaint Neurologic: Sensation intact distally Psychiatric: Patient is at baseline mood and affect Lymphatic: No axillary or cervical lymphadenopathy  MUSCULOSKELETAL:  Left hip is shortened externally rotated compared to the right.  Sensation is intact to light touch in all distributions of left foot.  She is able to extend all of her toes and plantar flex all of the toes.  Independent Imaging Review: Left intertrochanteric hip fracture  Assessment: 87 year old female with a left intertrochanteric hip fracture.  I have spoken with her and her son [name].  I do believe she would benefit from cephalomedullary rod fixation in order to allow her to ambulate as tolerated with weight as tolerated as well as to abduct the leg for hygiene.  We discussed really the palliative role of this type of procedure.  She does have a DNR order although I spoke with [name] and we are in agreement of temporarily suspending this for the hip procedure.  Plan: Plan for left hip cephalomedullary nail fixation   After a lengthy discussion  of treatment options, including risks, benefits, alternatives, complications of surgical and nonsurgical conservative options, the patient elected surgical repair.   The patient  is aware of the material risks  and complications including, but not limited to injury to adjacent structures, neurovascular injury, infection, numbness, bleeding, implant failure, thermal burns, stiffness, persistent pain, failure to heal, disease transmission from allograft, need for further surgery, dislocation, anesthetic risks, blood clots, risks of death,and others. The probabilities of surgical success and failure  discussed with patient given their particular co-morbidities.The time and nature of expected rehabilitation and recovery was discussed.The patient's questions were all answered preoperatively.  No barriers to understanding were noted. I explained the natural history of the disease process and Rx rationale.  I explained to the patient what I considered to be reasonable expectations given their personal situation.  The final treatment plan was arrived at through a shared patient decision making process model.   Thank you for the consult and the opportunity to see Ms. Rodena Piety, MD [facility] 7:19 AM

## 2021-01-02 NOTE — Consult Note (Signed)
ORTHOPAEDIC CONSULTATION  REQUESTING PHYSICIAN: Swayze, Ava, DO  Chief Complaint: left hip pain  HPI: [name] is a 87 y.o. female who presents with left hip pain after a fall from standing.  Of note the majority of the history was collected via her son [name].  Reportedly she is a baseline limited ambulator but is able to ambulate without difficulty with the use of a walker.  She initially presented to the emergency room.  She does have a history of A. fib on Eliquis although this has been now 2 days since she last taken it.  She currently has significant pain in the left hip and is not able to put any weight on this.  Past Medical History:  Diagnosis Date   CAD (coronary artery disease), native coronary artery    s/p stents   Dyslipidemia    Hypertension    Hypothyroidism (acquired)    PAF (paroxysmal atrial fibrillation) ([place])    on Eliquis   History reviewed. No pertinent surgical history. Social History   Socioeconomic History   Marital status: Unknown    Spouse name: Not on file   Number of children: Not on file   Years of education: Not on file   Highest education level: Not on file  Occupational History   Not on file  Tobacco Use   Smoking status: Never   Smokeless tobacco: Never  Substance and Sexual Activity   Alcohol use: Not Currently   Drug use: Never   Sexual activity: Not on file  Other Topics Concern   Not on file  Social History Narrative   Not on file   Social Determinants of Health   Financial Resource Strain: Not on file  Food Insecurity: Not on file  Transportation Needs: Not on file  Physical Activity: Not on file  Stress: Not on file  Social Connections: Not on file   History reviewed. No pertinent family history. - negative except otherwise stated in the family history section No Known Allergies Prior to Admission medications   Medication Sig Start Date End Date Taking? Authorizing Provider  acetaminophen (TYLENOL) 500 MG tablet  Take 500 mg by mouth 2 (two) times daily.   Yes [provider]  apixaban (ELIQUIS) 2.5 MG TABS tablet Take 2.5 mg by mouth 3 (three) times daily. 8am, 2pm, 8pm   Yes [provider]  atorvastatin (LIPITOR) 10 MG tablet Take 10 mg by mouth at bedtime. 12/31/20  Yes [provider]  diltiazem (CARDIZEM) 30 MG tablet Take 30 mg by mouth 3 (three) times daily. 8am, 2pm, 8pm 12/23/20  Yes [provider]  Emollient (EUCERIN) lotion Apply topically See admin instructions. Apply topically to both legs daily at bedtime   Yes [provider]  furosemide (LASIX) 20 MG tablet Take 20 mg by mouth every Monday, Wednesday, and Friday. 12/24/20  Yes [provider]  levothyroxine (SYNTHROID) 100 MCG tablet Take 100 mcg by mouth daily at 6 (six) AM. 12/31/20  Yes [provider]  magnesium citrate SOLN Take 1 Bottle by mouth daily as needed (constipation).   Yes [provider]  Metoprolol Tartrate 75 MG TABS Take 75 mg by mouth 2 (two) times daily.   Yes [provider]  mirabegron ER (MYRBETRIQ) 50 MG TB24 tablet Take 50 mg by mouth at bedtime.   Yes [provider]  Multiple Vitamin (MULTIVITAMIN WITH MINERALS) TABS tablet Take 1 tablet by mouth every morning.   Yes [provider]  nitroGLYCERIN (NITROSTAT) 0.4 MG SL tablet Place 0.4 mg under the tongue every 5 (five) minutes as needed for chest pain.   Yes [provider]  ondansetron (ZOFRAN) 4 MG tablet Take 4 mg by mouth every morning. 12/23/20  Yes [provider]  polyethylene glycol (MIRALAX / GLYCOLAX) 17 g packet Take 17 g by mouth every 12 (twelve) hours as needed (constipation).   Yes [provider]  Probiotic Product (PROBIOTIC-10 ULTIMATE) CAPS Take 1 capsule by mouth every morning.   Yes [provider]  senna-docusate (SENOKOT-S) 8.6-50 MG tablet Take 1 tablet by mouth every 12 (twelve) hours as needed (constipation).    Yes [provider]  trimethoprim (TRIMPEX) 100 MG tablet Take 100 mg by mouth every morning. 12/31/20  Yes [provider]  vitamin B-12 (CYANOCOBALAMIN) 1000 MCG tablet Take 1,000 mcg by mouth every morning.   Yes [provider]   CT HEAD WO CONTRAST  Result Date: 01/01/2021 CLINICAL DATA:  87 year old female status post fall with hip fracture. On blood thinners. EXAM: CT HEAD WITHOUT CONTRAST TECHNIQUE: Contiguous axial images were obtained from the base of the skull through the vertex without intravenous contrast. COMPARISON:  Brain MRI 08/06/2020.  Head CT 08/05/2020. FINDINGS: Brain: Stable cerebral volume. No midline shift, mass effect, or evidence of intracranial mass lesion. Stable ventricle size and configuration. Stable patchy and confluent bilateral white matter hypodensity, tracking into the deep white matter capsules and thalamus on the left. Stable mild heterogeneity in the brainstem. No acute intracranial hemorrhage identified. No cortically based acute infarct identified. Vascular: Calcified atherosclerosis at the skull base. No suspicious intracranial vascular hyperdensity. 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 No skull fracture identified. Sinuses/Orbits: Mildly increased mucosal thickening and bubbly opacity in the left maxillary and bilateral sphenoid sinuses since April. Tympanic cavities and mastoids remain clear. Other: No acute orbit or scalp soft tissue injury. IMPRESSION: 1. No acute intracranial abnormality or acute traumatic injury identified. 2. Stable CT appearance of chronic small vessel disease. 3. Mildly increased paranasal sinus inflammation since April. Electronically Signed   By: Genevie Ann M.D.   On: 01/01/2021 05:36   CT CERVICAL SPINE WO CONTRAST  Result Date: 01/01/2021 CLINICAL DATA:  87 year old female status post fall with hip fracture. On blood thinners. EXAM: CT CERVICAL SPINE WITHOUT CONTRAST  TECHNIQUE: Multidetector CT imaging of the cervical spine was performed without intravenous contrast. Multiplanar CT image reconstructions were also generated. COMPARISON:  Head CT today. Cervical spine CT 08/03/2020. FINDINGS: Alignment: Stable since April. Chronic anterolisthesis of C4 on C5 and C7 on T1. Bilateral posterior element alignment is within normal limits. Skull base and vertebrae: Visualized skull base is intact. No atlanto-occipital dissociation. C1 and C2 are chronically degenerated but appears stable and intact. Congenital incomplete ossification of the posterior C1 ring. No acute osseous abnormality identified. Soft tissues and spinal canal: No prevertebral fluid or swelling. No visible canal hematoma. Advanced bilateral cervical carotid calcified atherosclerosis. Disc levels: Chronic severe C1-C2 degeneration including bulky odontoid subchondral cysts, partially calcified ligamentous hypertrophy. Widespread severe facet degeneration. Advanced calcified disc degeneration at C4-C5 in the setting of spondylolisthesis with chronic spinal stenosis there. Bulky chronic ligament flavum hypertrophy and calcification at the cervicothoracic junction, also with a degree of spinal stenosis. Upper chest: Stable. IMPRESSION: 1. No acute traumatic injury identified in the cervical spine. 2. Chronic severe cervical spine degeneration appears stable. 3. Advanced carotid atherosclerosis. Electronically Signed   By: Herminio Heads.D.  On: 01/01/2021 05:40   DG Chest Port 1 View  Result Date: 01/01/2021 CLINICAL DATA:  87 year old female status post fall with hip fracture. On blood thinners. EXAM: PORTABLE CHEST 1 VIEW COMPARISON:  Portable chest 05/30/2020 and earlier, including CTA abdomen 11/26/2020. FINDINGS: Portable AP semi upright view at 0445 hours. Lower lung volumes. Stable mild cardiomegaly. Calcified aortic atherosclerosis. Other mediastinal contours are within normal limits. Visualized tracheal air  column is within normal limits. Stable elevation of the right hemidiaphragm. Allowing for portable technique the lungs are clear. No pneumothorax or pleural effusion. Osteopenia. Chronic deformity proximal left humerus. No acute osseous abnormality identified. IMPRESSION: No acute cardiopulmonary abnormality. Electronically Signed   By: Genevie Ann M.D.   On: 01/01/2021 05:32   DG Knee Complete 4 Views Left  Result Date: 01/01/2021 CLINICAL DATA:  87 year old female status post fall with left hip fracture. EXAM: LEFT KNEE - 3 VIEW COMPARISON:  Left hip series today. FINDINGS: Each of these three views is oblique due to difficulty with patient positioning in the setting of hip fracture. Advanced calcified peripheral vascular disease. Bone mineralization is within normal limits for age. Distal femur, patella, proximal tibia and fibula appear intact. Joint spaces appear normal for age. No joint effusion is evident. IMPRESSION: 1. No acute fracture or dislocation identified about the left knee. 2. Advanced calcified peripheral vascular disease. Electronically Signed   By: Genevie Ann M.D.   On: 01/01/2021 07:37   DG HIP PORT UNILAT WITH PELVIS 1V LEFT  Result Date: 01/01/2021 CLINICAL DATA:  87 year old female status post fall. On blood thinners. EXAM: DG HIP (WITH OR WITHOUT PELVIS) 1V PORT LEFT COMPARISON:  CTA abdomen and pelvis 11/26/2020. FINDINGS: Portable AP and cross-table lateral views of the pelvis and left hip. Acute left femur intertrochanteric fracture with mild varus impaction. Left femoral head remains normally located. Pelvis appears to remain intact. Grossly intact proximal right femur. Extensive iliofemoral calcified atherosclerosis. Nonobstructed visible bowel gas pattern. IMPRESSION: Acute left femur intertrochanteric fracture with mild varus impaction. Electronically Signed   By: Genevie Ann M.D.   On: 01/01/2021 05:30     Positive ROS: All other systems have been reviewed and were otherwise negative  with the exception of those mentioned in the HPI and as above.  Physical Exam: General: No acute distress Cardiovascular: No pedal edema Respiratory: No cyanosis, no use of accessory musculature GI: No organomegaly, abdomen is soft and non-tender Skin: No lesions in the area of chief complaint Neurologic: Sensation intact distally Psychiatric: Patient is at baseline mood and affect Lymphatic: No axillary or cervical lymphadenopathy  MUSCULOSKELETAL:  Left hip is shortened externally rotated compared to the right.  Sensation is intact to light touch in all distributions of left foot.  She is able to extend all of her toes and plantar flex all of the toes.  Independent Imaging Review: Left intertrochanteric hip fracture  Assessment: 87 year old female with a left intertrochanteric hip fracture.  I have spoken with her and her son [name].  I do believe she would benefit from cephalomedullary rod fixation in order to allow her to ambulate as tolerated with weight as tolerated as well as to abduct the leg for hygiene.  We discussed really the palliative role of this type of procedure.  She does have a DNR order although I spoke with [name] and we are in agreement of temporarily suspending this for the hip procedure.  Plan: Plan for left hip cephalomedullary nail fixation   After a lengthy discussion  of treatment options, including risks, benefits, alternatives, complications of surgical and nonsurgical conservative options, the patient elected surgical repair.   The patient  is aware of the material risks  and complications including, but not limited to injury to adjacent structures, neurovascular injury, infection, numbness, bleeding, implant failure, thermal burns, stiffness, persistent pain, failure to heal, disease transmission from allograft, need for further surgery, dislocation, anesthetic risks, blood clots, risks of death,and others. The probabilities of surgical success and failure  discussed with patient given their particular co-morbidities.The time and nature of expected rehabilitation and recovery was discussed.The patient's questions were all answered preoperatively.  No barriers to understanding were noted. I explained the natural history of the disease process and Rx rationale.  I explained to the patient what I considered to be reasonable expectations given their personal situation.  The final treatment plan was arrived at through a shared patient decision making process model.   Thank you for the consult and the opportunity to see Ms. Rodena Piety, MD [facility] 7:19 AM

## 2021-01-02 NOTE — Interval H&P Note (Signed)
History and Physical Interval Note:  01/02/2021 7:24 AM  [name]  has presented today for surgery, with the diagnosis of Left Hip Fx.  The various methods of treatment have been discussed with the patient and family. After consideration of risks, benefits and other options for treatment, the patient has consented to  Procedure(s): INTRAMEDULLARY (IM) NAIL INTERTROCHANTRIC (Left) as a surgical intervention.  The patient's history has been reviewed, patient examined, no change in status, stable for surgery.  I have reviewed the patient's chart and labs.  Questions were answered to the patient's satisfaction.     Vanetta Mulders

## 2021-01-03 DIAGNOSIS — S72002D Fracture of unspecified part of neck of left femur, subsequent encounter for closed fracture with routine healing: Secondary | ICD-10-CM

## 2021-01-03 LAB — BASIC METABOLIC PANEL
Anion gap: 10 (ref 5–15)
BUN: 31 mg/dL — ABNORMAL HIGH (ref 8–23)
CO2: 27 mmol/L (ref 22–32)
Calcium: 8.8 mg/dL — ABNORMAL LOW (ref 8.9–10.3)
Chloride: 100 mmol/L (ref 98–111)
Creatinine, Ser: 1.47 mg/dL — ABNORMAL HIGH (ref 0.44–1.00)
GFR, Estimated: 32 mL/min — ABNORMAL LOW (ref >60)
Glucose, Bld: 115 mg/dL — ABNORMAL HIGH (ref 70–99)
Potassium: 5.1 mmol/L (ref 3.5–5.1)
Sodium: 137 mmol/L (ref 135–145)

## 2021-01-03 NOTE — Evaluation (Signed)
Physical Therapy Evaluation Patient Details Name: [name] MRN: [id number] DOB: [birth year] Today's Date: 01/03/2021  History of Present Illness  [name] is a 87 y.o. female who presents with left hip pain after a fall from standing; s/p L hip ORIF, WBAT;  has a past medical history of CAD (coronary artery disease), native coronary artery, Dyslipidemia, Hypertension, Hypothyroidism (acquired), and PAF (paroxysmal atrial fibrillation) ([place]).  Clinical Impression   Patient is s/p above surgery resulting in functional limitations due to the deficits listed below (see PT Problem List). Pt reports she lives alone; describes an ALF-type living situation (walks to dining hall, staff available for assist), and walked with a rW prior to admission; Presents to PT with decr functional mobility, L hip pain limiting activity tolerance; required 2 person mod/Max assist to help pt OOB to recliner;  Patient will benefit from skilled PT to increase their independence and safety with mobility to allow discharge to the venue listed below.          Recommendations for follow up therapy are one component of a multi-disciplinary discharge planning process, led by the attending physician.  Recommendations may be updated based on patient status, additional functional criteria and insurance authorization.  Follow Up Recommendations SNF    Equipment Recommendations  Rolling walker with 5" wheels;3in1 (PT)    Recommendations for Other Services       Precautions / Restrictions Precautions Precautions: Fall Restrictions LLE Weight Bearing: Weight bearing as tolerated      Mobility  Bed Mobility Overal bed mobility: Needs Assistance Bed Mobility: Supine to Sit     Supine to sit: +2 for physical assistance;Max assist     General bed mobility comments: Cues for technique, and for Hnd placement/use of rails; Max assist to elevate trunk to sit and square off hips at EOB    Transfers Overall  transfer level: Needs assistance Equipment used: 2 person hand held assist Transfers: Sit to/from [facility] Sit to Stand: Mod assist Stand pivot transfers: Max assist;+2 physical assistance       General transfer comment: Heavy mod assist to power up to stand, but with notable hip and knee extension muscle recruitment; Max assist of 2 for basic pivot bed to recliner placed on pt's less painful r side  Ambulation/Gait                Stairs            Wheelchair Mobility    Modified Rankin (Stroke Patients Only)       Balance Overall balance assessment: Needs assistance Sitting-balance support: Feet supported;Bilateral upper extremity supported Sitting balance-Leahy Scale: Poor Sitting balance - Comments: posterior lean     Standing balance-Leahy Scale: Poor                               Pertinent Vitals/Pain Pain Assessment: Faces Faces Pain Scale: Hurts worst Pain Location: L hip with any movement Pain Descriptors / Indicators: Aching;Discomfort;Grimacing;Guarding Pain Intervention(s): Monitored during session;Premedicated before session;Repositioned    Home Living Family/patient expects to be discharged to:: Assisted living Living Arrangements: Alone;Other (Comment) (in ALF or retirement-type community) Available Help at Discharge: Family;Other (Comment) (staff at residence) Type of Home: Other(Comment) (Seems like an ALF)         Home Equipment: Walker - 4 wheels      Prior Function Level of Independence: Needs assistance   Gait / Transfers Assistance Needed:  Pt describes walking with her RW to the dining hall  ADL's / Homemaking Assistance Needed: Not entirely sure of assist with bathing/dressing; Pt tells me the staff cook and clean where she lives  Comments: Not exactly sure of living situation, however she dexcribes what sound like an ALF-type residence     Hand Dominance        Extremity/Trunk Assessment    Upper Extremity Assessment Upper Extremity Assessment: Generalized weakness    Lower Extremity Assessment Lower Extremity Assessment: LLE deficits/detail LLE Deficits / Details: Grossly decr AROM and strength, limited by pain postop; decr tolerance of L knee and hip flexion with considerable muscle guarding LLE: Unable to fully assess due to pain       Communication   Communication: HOH;Other (comment) (slow to answer questions, but somewhat brighter once she was up in the recliner)  Cognition Arousal/Alertness: Awake/alert (initially sleepy and needing cues to open eyes; more alert once EOB) Behavior During Therapy: WFL for tasks assessed/performed Overall Cognitive Status: Impaired/Different from baseline Area of Impairment: Attention;Following commands;Awareness;Problem solving                   Current Attention Level: Sustained (easily internally distracted by pain)   Following Commands: Follows one step commands inconsistently;Follows one step commands with increased time   Awareness: Intellectual Problem Solving: Slow processing;Decreased initiation;Difficulty sequencing;Requires verbal cues;Requires tactile cues        General Comments General comments (skin integrity, edema, etc.): See other PT note of this date    Exercises     Assessment/Plan    PT Assessment Patient needs continued PT services  PT Problem List Decreased strength;Decreased range of motion;Decreased activity tolerance;Decreased balance;Decreased mobility;Decreased coordination;Decreased cognition;Decreased knowledge of use of DME;Decreased knowledge of precautions;Decreased safety awareness;Cardiopulmonary status limiting activity;Decreased skin integrity;Pain       PT Treatment Interventions DME instruction;Gait training;Functional mobility training;Therapeutic activities;Therapeutic exercise;Balance training;Neuromuscular re-education;Cognitive remediation;Patient/family education     PT Goals (Current goals can be found in the Care Plan section)  Acute Rehab PT Goals Patient Stated Goal: Did not state PT Goal Formulation: Patient unable to participate in goal setting Time For Goal Achievement: 01/17/21 Potential to Achieve Goals: Good    Frequency Min 2X/week   Barriers to discharge        Co-evaluation               AM-PAC PT "6 Clicks" Mobility  Outcome Measure Help needed turning from your back to your side while in a flat bed without using bedrails?: A Lot Help needed moving from lying on your back to sitting on the side of a flat bed without using bedrails?: A Lot Help needed moving to and from a bed to a chair (including a wheelchair)?: A Lot Help needed standing up from a chair using your arms (e.g., wheelchair or bedside chair)?: Total Help needed to walk in hospital room?: Total Help needed climbing 3-5 steps with a railing? : Total 6 Click Score: 9    End of Session Equipment Utilized During Treatment: Gait belt Activity Tolerance: Patient limited by pain Patient left: in chair;with call bell/phone within reach;with chair alarm set Nurse Communication: Mobility status PT Visit Diagnosis: Unsteadiness on feet (R26.81);Other abnormalities of gait and mobility (R26.89);History of falling (Z91.81);Pain Pain - Right/Left: Left Pain - part of body: Hip    Time: 3354-5625 PT Time Calculation (min) (ACUTE ONLY): 32 min   Charges:   PT Evaluation $PT Eval Moderate Complexity: 1 Mod PT Treatments $Therapeutic  Activity: 8-22 mins        Roney Marion, [place]  Acute Rehabilitation Services Pager [phone] Office [place] 01/03/2021, 2:39 PM

## 2021-01-03 NOTE — Progress Notes (Signed)
   Subjective:  patient is sleeping comfortably on arrival.  Pain is well controlled.  Pending physical therapy.  Objective:   VITALS:   Vitals:   01/03/21 0017 01/03/21 0607 01/03/21 0735 01/03/21 0748  BP: (!) 112/58 100/62  107/74  Pulse: 91 91  72  Resp: 16 15  (!) 22  Temp: (!) 97.5 F (36.4 C) 98.2 F (36.8 C)  (!) 97.5 F (36.4 C)  TempSrc: Oral Oral  Oral  SpO2: 100% 100% 95% 100%  Weight:      Height:        Left hip dressing is clean dry and intact.  Patient spontaneously able to flex and extend the left foot.  Remainder of exam is limited by sleepy status at this point.  Lab Results  Component Value Date   WBC 9.1 01/02/2021   HGB 12.2 01/02/2021   HCT 37.7 01/02/2021   MCV 99.5 01/02/2021   PLT 187 01/02/2021     Assessment/Plan:  1 Day Post-Op status post left hip cephalomedullary nail  - Patient to work with PT/OT to optimize mobilization safely - DVT ppx - SCDs, ambulation, may resume home Eliquis - WBAT operative extremity - Pain control - multimodal pain management, ATC acetaminophen in conjunction with as needed narcotic (oxycodone), although this should be minimized with other modalities  - Discharge planning pending CM, appreciate coordination    [name] 01/03/2021, 7:49 AM

## 2021-01-03 NOTE — Progress Notes (Signed)
PROGRESS NOTE  [name] LFY:[id number] DOB: 1921-02-01 DOA: 01/01/2021 PCP: Default, Provider, MD  Brief History   [name] is a 87 y.o. female with medical history significant of CAD s/p stents; HTN; HLD; afib on Eliquis; and hypothyroidism presenting with a fall.  She was recently hospitalized with cholecystitis (11/2020) and discharged to [facility].  She reports that she got up overnight to use the bathroom and her legs buckled underneath her.  Her left leg has throbbing intermittent pain.  She is supposed to use a walker but does not always use it and was not using it last night.  Her son would prefer that she not take medications, particularly ones that she does not need.  He is in agreement with stopping Eliquis.   87 year old female presents after a fall.  She has a left hip fracture and is on Eliquis.  Dr. Lyla Herrera was consulted by EDP and recommended to hold off Eliquis for the next 24 to 36 hours.  Patient received Eliquis 8 hours prior to presentation.  Per orthopedic surgery patient can eat today.    The patient was admitted to a telemetry bed. She was made NPO after midnight and underwent Long cephalomedullary nailing of the left hip this morning. She has tolerated the procedure well.  Consultants  Orthopedic surgery  Procedures  Long cephalomedullary naling of left hip.  Antibiotics   Anti-infectives (From admission, onward)    Start     Dose/Rate Route Frequency Ordered Stop   01/02/21 0600  ceFAZolin (ANCEF) IVPB 2g/100 mL premix        2 g 200 mL/hr over 30 Minutes Intravenous To Short Stay 01/01/21 2244 01/02/21 0758   01/01/21 1100  trimethoprim (TRIMPEX) tablet 100 mg        100 mg Oral Every morning 01/01/21 1052        Subjective  The patient is resting comfortably. No new complaints.  Objective   Vitals:  Vitals:   01/03/21 1513 01/03/21 1516  BP: 104/77   Pulse: (!) 111 (!) 102  Resp: 20   Temp: 97.7 F (36.5 C)   SpO2: 99%      Exam:  Constitutional:  The patient is resting comfortably. No new complaints.  Respiratory:  CTA bilaterally, no w/r/r.  Respiratory effort normal. No retractions or accessory muscle use Cardiovascular:  RRR, no m/r/g No LE extremity edema   Normal pedal pulses Abdomen:  Abdomen appears normal; no tenderness or masses No hernias No HSM Musculoskeletal:  Digits/nails BUE: no clubbing, cyanosis, petechiae, infection exam of joints, bones, muscles of at least one of following: head/neck, RUE, LUE, RLE, LLE   Skin:  No rashes, lesions, ulcers palpation of skin: no induration or nodules Neurologic:  Patient is awake, alert, and oriented x 3. Moving all extremities CN II- XII grossly intact.  I have personally reviewed the following:   Today's Data  Vitals.  Lab Data  BMP, CBC, INR  Imaging  X-ray of femur  Cardiology Data  EKG  Scheduled Meds:  apixaban  2.5 mg Oral BID   atorvastatin  10 mg Oral QHS   diltiazem  30 mg Oral TID   docusate sodium  100 mg Oral BID   feeding supplement  237 mL Oral TID BM   levothyroxine  100 mcg Oral Q0600   metoprolol succinate  50 mg Oral Daily   multivitamin with minerals  1 tablet Oral Daily   ondansetron  4 mg Oral q morning  trimethoprim  100 mg Oral q morning   vitamin B-12  1,000 mcg Oral q morning   Continuous Infusions:  methocarbamol (ROBAXIN) IV      Principal Problem:   Hip fracture, left (HCC) Active Problems:   CAD (coronary artery disease), native coronary artery   Dyslipidemia   Hypertension   Hypothyroidism (acquired)   PAF (paroxysmal atrial fibrillation) (HCC)   LOS: 2 days   A & P  Hip fracture -Mechanical fall resulting in hip fracture -Orthopedics consulted; S/P intramedullary nailing of right hip.  -Pain control with Robxain, Vicodin, and Morphine prn -TOC consult for rehab placement - she is coming from and returning to [facility] -Will need PT consult post-operatively -Hip  fracture order set utilized   Cholecystocolonic fistula, suspected; Acalculus cholecystitis -Recently admitted for this issue -s/p antibiotics -MRCP during last admission with suspected cholecystocolonic fistula not well demonstrated by MR, but notable diffuse gallbladder wall thickening which could reflect a calculus cholecystitis, mild extrahepatic biliary dilation without evidence of choledocholithiasis.  -HIDA scan notable for no filling within the gallbladder but patent common bile duct.   -She was treated with antibiotics -Perc drain was considered but family preferred avoidance of aggressive measures if possible -Appears to be generally stable at this time   Stage 3b CKD -Stable at this time -Will continue to follow electrolytes, creatinine and volume status.   PVD -Proximal SMA stenosis and L SFA origin stenosis noted during last hospitalization -Family prefers conservative care -Continue Lipitor for now, but could consider stopping based on ongoing family discussions   Afib -Rate controlled with Dilt, Metoprolol  -Short acting diltiazem has been restarted. -Will continue metoprolol but change to Toprol XL 50 mg daily and can increase if needed for HR/BP -Eliquis restarted   Hypothyroidism -Continue Synthroid at current dose for now    Urinary issues -Hold myrbetriq for now, consider d/c -Continue trimethoprim daily for UTI prophylaxis   I have seen and examined this patient myself. I have spent 34 minutes in her evaluation and care.  DNR -Out of facility DNR form is prominently displayed at the bedside  Note: This patient has been tested and is negative for the novel coronavirus COVID-19. The patient has been fully vaccinated against COVID-19.   Level of care: Med-Surg DVT prophylaxis:  SCDs until approved for Lovenox by orthopedics Code Status:  DNR - confirmed with patient/family Family Communication: None present initially, but I spoke with her son at the  bedside later in the morning Disposition Plan:  SNF once clinically improved   Lori Mankin, DO [facility] Direct contact: see [email or web address]  7PM-7AM contact night coverage as above 01/03/2021, 5:59 PM  LOS: 1 day

## 2021-01-03 NOTE — Progress Notes (Signed)
Physical Therapy Note  PT eval complete with full note to follow;  Recommend SNF for transition out of hospital;    SATURATION QUALIFICATIONS: (This note is used to comply with regulatory documentation for home oxygen)  Patient Saturations on Room Air at Rest = 92%  Patient Saturations on Room Air while mobilizing = 84%  Patient Saturations on 2 Liters of oxygen while mobilizing = 94%  Please briefly explain why patient needs home oxygen:  Roney Marion, [place] Pager [phone] Office [phone]

## 2021-01-04 ENCOUNTER — Encounter (HOSPITAL_COMMUNITY): Payer: Self-pay | Admitting: Orthopaedic Surgery

## 2021-01-04 LAB — BASIC METABOLIC PANEL
Anion gap: 6 (ref 5–15)
BUN: 42 mg/dL — ABNORMAL HIGH (ref 8–23)
CO2: 29 mmol/L (ref 22–32)
Calcium: 8.5 mg/dL — ABNORMAL LOW (ref 8.9–10.3)
Chloride: 99 mmol/L (ref 98–111)
Creatinine, Ser: 1.41 mg/dL — ABNORMAL HIGH (ref 0.44–1.00)
GFR, Estimated: 34 mL/min — ABNORMAL LOW (ref >60)
Glucose, Bld: 95 mg/dL (ref 70–99)
Potassium: 5.1 mmol/L (ref 3.5–5.1)
Sodium: 134 mmol/L — ABNORMAL LOW (ref 135–145)

## 2021-01-04 NOTE — Progress Notes (Addendum)
PROGRESS NOTE  [name] HAL:[id number] DOB: October 18, 1920 DOA: 01/01/2021 PCP: Default, Provider, MD  Brief History   [name] is a 87 y.o. female with medical history significant of CAD s/p stents; HTN; HLD; afib on Eliquis; and hypothyroidism presenting with a fall.  She was recently hospitalized with cholecystitis (11/2020) and discharged to [facility].  She reports that she got up overnight to use the bathroom and her legs buckled underneath her.  Her left leg has throbbing intermittent pain.  She is supposed to use a walker but does not always use it and was not using it last night.  Her son would prefer that she not take medications, particularly ones that she does not need.  He is in agreement with stopping Eliquis.   87 year old female presents after a fall.  She has a left hip fracture and is on Eliquis.  Dr. Lyla Glassing was consulted by EDP and recommended to hold off Eliquis for the next 24 to 36 hours.  Patient received Eliquis 8 hours prior to presentation.  Per orthopedic surgery patient can eat today.    The patient was admitted to a telemetry bed. She was made NPO after midnight and underwent Long cephalomedullary nailing of the left hip this morning. She has tolerated the procedure well.  Consultants  Orthopedic surgery  Procedures  Long cephalomedullary naling of left hip.  Antibiotics   Anti-infectives (From admission, onward)    Start     Dose/Rate Route Frequency Ordered Stop   01/02/21 0600  ceFAZolin (ANCEF) IVPB 2g/100 mL premix        2 g 200 mL/hr over 30 Minutes Intravenous To Short Stay 01/01/21 2244 01/02/21 0758   01/01/21 1100  trimethoprim (TRIMPEX) tablet 100 mg        100 mg Oral Every morning 01/01/21 1052        Subjective  The patient is resting comfortably. No new complaints.  Objective   Vitals:  Vitals:   01/04/21 0825 01/04/21 0954  BP: (!) 89/63 (!) 97/56  Pulse: 93 93  Resp: 16   Temp: 97.6 F (36.4 C)   SpO2: 100%      Exam:  Constitutional:  The patient is resting comfortably. No new complaints.  Respiratory:  CTA bilaterally, no w/r/r.  Respiratory effort normal. No retractions or accessory muscle use Cardiovascular:  RRR, no m/r/g No LE extremity edema   Normal pedal pulses Abdomen:  Abdomen appears normal; no tenderness or masses No hernias No HSM Musculoskeletal:  Digits/nails BUE: no clubbing, cyanosis, petechiae, infection exam of joints, bones, muscles of at least one of following: head/neck, RUE, LUE, RLE, LLE   Skin:  No rashes, lesions, ulcers palpation of skin: no induration or nodules Neurologic:  Patient is awake, alert, and oriented x 3. Moving all extremities CN II- XII grossly intact.  I have personally reviewed the following:   Today's Data  Vitals.  Lab Data  BMP, CBC, INR  Imaging  X-ray of femur  Cardiology Data  EKG  Scheduled Meds:  apixaban  2.5 mg Oral BID   atorvastatin  10 mg Oral QHS   diltiazem  30 mg Oral TID   docusate sodium  100 mg Oral BID   feeding supplement  237 mL Oral TID BM   levothyroxine  100 mcg Oral Q0600   metoprolol succinate  50 mg Oral Daily   multivitamin with minerals  1 tablet Oral Daily   ondansetron  4 mg Oral q morning  trimethoprim  100 mg Oral q morning   vitamin B-12  1,000 mcg Oral q morning   Continuous Infusions:  methocarbamol (ROBAXIN) IV      Principal Problem:   Hip fracture, left (HCC) Active Problems:   CAD (coronary artery disease), native coronary artery   Dyslipidemia   Hypertension   Hypothyroidism (acquired)   PAF (paroxysmal atrial fibrillation) (HCC)   LOS: 3 days   A & P  Hip fracture -Mechanical fall resulting in hip fracture -Orthopedics consulted; S/P intramedullary nailing of right hip.  -Pain control with Robxain, Vicodin, and Morphine prn -TOC consult for rehab placement - she is coming from and returning to [employer] [facility] -Will need PT consult post-operatively -Hip  fracture order set utilized   Cholecystocolonic fistula, suspected; Acalculus cholecystitis -Recently admitted for this issue -s/p antibiotics -MRCP during last admission with suspected cholecystocolonic fistula not well demonstrated by MR, but notable diffuse gallbladder wall thickening which could reflect a calculus cholecystitis, mild extrahepatic biliary dilation without evidence of choledocholithiasis.  -HIDA scan notable for no filling within the gallbladder but patent common bile duct.   -She was treated with antibiotics -Perc drain was considered but family preferred avoidance of aggressive measures if possible -Appears to be generally stable at this time   Stage 3b CKD -Stable at this time -Will continue to follow electrolytes, creatinine and volume status.   PVD -Proximal SMA stenosis and L SFA origin stenosis noted during last hospitalization -Family prefers conservative care -Continue Lipitor for now, but could consider stopping based on ongoing family discussions   Afib -Rate controlled with Dilt, Metoprolol  -Short acting diltiazem has been restarted. -Will continue metoprolol but change to Toprol XL 50 mg daily and can increase if needed for HR/BP -Eliquis restarted   Hypothyroidism -Continue Synthroid at current dose for now    Underweight: Pt with BMI of 17.5, temporal wasting, and cachexia. Nutrition consulted.  Urinary issues -Hold myrbetriq for now, consider d/c -Continue trimethoprim daily for UTI prophylaxis   I have seen and examined this patient myself. I have spent 34 minutes in her evaluation and care.  DNR -Out of facility DNR form is prominently displayed at the bedside  Note: This patient has been tested and is negative for the novel coronavirus COVID-19. The patient has been fully vaccinated against COVID-19.   Level of care: Med-Surg DVT prophylaxis:  SCDs until approved for Lovenox by orthopedics Code Status:  DNR - confirmed with  patient/family Family Communication: None present initially, but I spoke with her son at the bedside later in the morning Disposition Plan:  SNF once clinically improved   Merrillyn Ackerley, DO [facility] Direct contact: see [email or web address]  7PM-7AM contact night coverage as above 01/04/2021, 4:07 PM  LOS: 1 day         j

## 2021-01-04 NOTE — NC FL2 (Signed)
[place] LEVEL OF CARE SCREENING TOOL     IDENTIFICATION  Patient Name: [name] Birthdate: 1921-03-10 Sex: female Admission Date (Current Location): 01/01/2021  [facility] and [place] Number:  Herbalist and Address:  [facility], [place] [address] [place], [place] [address]      Provider Number: [id number]  Attending Physician Name and Address:  Karie Kirks, DO  Relative Name and Phone Number:       Current Level of Care: Hospital Recommended Level of Care: [place] Prior Approval Number:    Date Approved/Denied:   PASRR Number: [id number]  Discharge Plan: SNF    Current Diagnoses: Patient Active Problem List   Diagnosis Date Noted   Hip fracture, left ([place]) 01/01/2021   CAD (coronary artery disease), native coronary artery 01/01/2021   Dyslipidemia 01/01/2021   Hypertension 01/01/2021   Hypothyroidism (acquired) 01/01/2021   PAF (paroxysmal atrial fibrillation) ([place]) 01/01/2021    Orientation RESPIRATION BLADDER Height & Weight     Self, Place  Normal Continent Weight: 110 lb (49.9 kg) Height:  5\' 6"  (167.6 cm)  BEHAVIORAL SYMPTOMS/MOOD NEUROLOGICAL BOWEL NUTRITION STATUS      Continent Diet  AMBULATORY STATUS COMMUNICATION OF NEEDS Skin   Extensive Assist Verbally Normal, Surgical wounds                       Personal Care Assistance Level of Assistance  Bathing, Feeding, Dressing Bathing Assistance: Maximum assistance Feeding assistance: Limited assistance Dressing Assistance: Maximum assistance     Functional Limitations Info  Sight, Hearing, Speech Sight Info: Adequate Hearing Info: Adequate Speech Info: Adequate    SPECIAL CARE FACTORS FREQUENCY  PT (By licensed PT), OT (By licensed OT)     PT Frequency: 5x a week OT Frequency: 5x a week            Contractures Contractures Info: Not present    Additional Factors Info  Code Status, Allergies Code Status Info:  DNR Allergies Info: NKA           Current Medications (01/04/2021):  This is the current hospital active medication list Current Facility-Administered Medications  Medication Dose Route Frequency Provider Last Rate Last Admin   apixaban (ELIQUIS) tablet 2.5 mg  2.5 mg Oral BID Swayze, Ava, DO   2.5 mg at 01/04/21 0757   atorvastatin (LIPITOR) tablet 10 mg  10 mg Oral QHS Vanetta Mulders, MD   10 mg at 01/03/21 2114   bisacodyl (DULCOLAX) EC tablet 5 mg  5 mg Oral Daily PRN Vanetta Mulders, MD       diltiazem (CARDIZEM) injection 5 mg  5 mg Intravenous Q6H PRN Vanetta Mulders, MD       diltiazem (CARDIZEM) tablet 30 mg  30 mg Oral TID Swayze, Ava, DO   30 mg at 01/04/21 0757   docusate sodium (COLACE) capsule 100 mg  100 mg Oral BID Vanetta Mulders, MD   100 mg at 01/04/21 0955   feeding supplement (ENSURE ENLIVE / ENSURE PLUS) liquid 237 mL  237 mL Oral TID BM Vanetta Mulders, MD   237 mL at 01/04/21 1000   HYDROcodone-acetaminophen (NORCO/VICODIN) 5-325 MG per tablet 1-2 tablet  1-2 tablet Oral Q6H PRN Vanetta Mulders, MD   1 tablet at 01/04/21 1107   levothyroxine (SYNTHROID) tablet 100 mcg  100 mcg Oral Q0600 Vanetta Mulders, MD   100 mcg at 01/04/21 0551   melatonin tablet 3 mg  3 mg Oral QHS PRN Vanetta Mulders, MD       methocarbamol (ROBAXIN) tablet 500 mg  500 mg Oral Q6H PRN Vanetta Mulders, MD   500 mg at 01/01/21 1955   Or   methocarbamol (ROBAXIN) 500 mg in dextrose 5 % 50 mL IVPB  500 mg Intravenous Q6H PRN Vanetta Mulders, MD       metoprolol succinate (TOPROL-XL) 24 hr tablet 50 mg  50 mg Oral Daily Vanetta Mulders, MD   50 mg at 01/04/21 0954   morphine 2 MG/ML injection 2 mg  2 mg Intravenous Q2H PRN Vanetta Mulders, MD   2 mg at 01/03/21 0759   multivitamin with minerals tablet 1 tablet  1 tablet Oral Daily Vanetta Mulders, MD   1 tablet at 01/04/21 0955   ondansetron (ZOFRAN) injection 4 mg  4 mg Intravenous Q6H PRN Vanetta Mulders, MD   4 mg at 01/03/21 0755    ondansetron (ZOFRAN) tablet 4 mg  4 mg Oral q morning Swayze, Ava, DO   4 mg at 01/04/21 1109   polyethylene glycol (MIRALAX / GLYCOLAX) packet 17 g  17 g Oral Daily PRN Vanetta Mulders, MD       trimethoprim (TRIMPEX) tablet 100 mg  100 mg Oral q morning Vanetta Mulders, MD   100 mg at 01/04/21 0277   vitamin B-12 (CYANOCOBALAMIN) tablet 1,000 mcg  1,000 mcg Oral q morning Swayze, Ava, DO   1,000 mcg at 01/04/21 1110     Discharge Medications: Please see discharge summary for a list of discharge medications.  Relevant Imaging Results:  Relevant Lab Results:   Additional Information SSN: [id number]  Emeterio Reeve, LCSW

## 2021-01-04 NOTE — Care Management Important Message (Signed)
Important Message  Patient Details  Name: [name] MRN: [id number] Date of Birth: 20-Nov-1920   Medicare Important Message Given:  Yes     [name] 01/04/2021, 3:49 PM

## 2021-01-04 NOTE — TOC CAGE-AID Note (Signed)
Transition of Care [facility]) - CAGE-AID Screening   Patient Details  Name: [name] MRN: [id number] Date of Birth: 10/29/1920  Transition of Care [facility]) CM/SW Contact:    Simra Fiebig C Tarpley-Carter, [place] Phone Number: 01/04/2021, 10:32 AM   Clinical Narrative: Pt is unable to participate in Cage Aid. Pt is not appropriate for assessment.  Zuhair Lariccia Tarpley-Carter, MSW, LCSW-A Pronouns:  She/Her/Hers [facility] HealthTransitions of Care Clinical Social Worker Direct Number:  [phone] [email or web address]  CAGE-AID Screening: Substance Abuse Screening unable to be completed due to: : Patient unable to participate (Pt not appropriate for assessment.)             Substance Abuse Education Offered: No

## 2021-01-04 NOTE — TOC Initial Note (Signed)
Transition of Care [facility]) - Initial/Assessment Note    Patient Details  Name: [name] MRN: [id number] Date of Birth: [birth year]  Transition of Care [facility]) CM/SW Contact:    Emeterio Reeve, LCSW Phone Number: 01/04/2021, 2:11 PM  Clinical Narrative:                  CSW received SNF consult. Pt was not able to participate in conversation at this time. Pt CSW introduced self and explained role at the hospital. Pt reports that PTA pt lived at [place]. Pt used a walker (when she remembered) and the ALF helps with ADL's.   CSW reviewed PT/OT recommendations for SNF. Pt son reports that he is fine with SNF, patient has been to [place] multiple times and son prefers her to return back.  Pt gave CSW permission to fax out to facilities in the area. CSW gave pt medicare.gov rating list to review. CSW explained insurance auth process. Pt reports they are covid vaccinated.  CSW will continue to follow.   Expected Discharge Plan: Skilled Nursing Facility Barriers to Discharge: Continued Medical Work up   Patient Goals and CMS Choice Patient states their goals for this hospitalization and ongoing recovery are:: to get stronger CMS Medicare.gov Compare Post Acute Care list provided to:: Patient Choice offered to / list presented to : Patient  Expected Discharge Plan and Services Expected Discharge Plan: [place] arrangements for the past 2 months: [place]                                      Prior Living Arrangements/Services Living arrangements for the past 2 months: [place] Lives with:: Facility Resident Patient language and need for interpreter reviewed:: Yes Do you feel safe going back to the place where you live?: Yes      Need for Family Participation in Patient Care: Yes (Comment) Care giver support system in place?: Yes (comment) Current home services: DME Criminal Activity/Legal  Involvement Pertinent to Current Situation/Hospitalization: No - Comment as needed  Activities of Daily Living      Permission Sought/Granted Permission sought to share information with : Facility Sport and exercise psychologist, Family Supports Permission granted to share information with : Yes, Verbal Permission Granted     Permission granted to share info w AGENCY: SNF  Permission granted to share info w Relationship: Son, Vickie Melnik     Emotional Assessment Appearance:: Appears stated age Attitude/Demeanor/Rapport: Unable to Assess Affect (typically observed): Unable to Assess Orientation: : Oriented to Place Alcohol / Substance Use: Not Applicable Psych Involvement: No (comment)  Admission diagnosis:  Hip fracture ([place]) [S72.009A] Trauma [T14.90XA] Fall [W19.XXXA] Hip fracture, left ([place]) [S72.002A] Closed fracture of left hip, initial encounter [facility]) [S72.002A] Patient Active Problem List   Diagnosis Date Noted   Hip fracture, left ([place]) 01/01/2021   CAD (coronary artery disease), native coronary artery 01/01/2021   Dyslipidemia 01/01/2021   Hypertension 01/01/2021   Hypothyroidism (acquired) 01/01/2021   PAF (paroxysmal atrial fibrillation) ([place]) 01/01/2021   PCP:  Default, Provider, MD Pharmacy:  No Pharmacies Listed    Social Determinants of Health (SDOH) Interventions    Readmission Risk Interventions No flowsheet data found.  Emeterio Reeve, LCSW Clinical Social Worker

## 2021-01-04 NOTE — Discharge Instructions (Signed)
     Discharge Instructions    Attending Surgeon: Vanetta Mulders, MD Office Phone Number: [phone]   Diagnosis and Procedures:    Surgeries Performed: Left hip nailing  Discharge Plan:    Diet: Resume usual diet. Begin with light or bland foods.  Drink plenty of fluids.  Activity:  Weight bearing as tolerated and activity as tolerated   GENERAL INSTRUCTIONS: 1.  Keep your surgical site elevated above your heart for at least 5-7 days or longer to prevent swelling. This will improve your comfort and your overall recovery following surgery.     2. Please call Dr. Eddie Dibbles office at [phone] with questions Monday-Friday during business hours. If no one answers, please leave a message and someone should get back to the patient within 24 hours. For emergencies please call 911 or proceed to the emergency room.   3. Patient to notify surgical team if experiences any of the following: Bowel/Bladder dysfunction, uncontrolled pain, nerve/muscle weakness, incision with increased drainage or redness, nausea/vomiting and Fever greater than 101.0 F.  Be alert for signs of infection including redness, streaking, odor, fever or chills. Be alert for excessive pain or bleeding and notify your surgeon immediately.  WOUND INSTRUCTIONS:   Leave your dressing/cast/splint in place until your post operative visit.  Keep it clean and dry.  Always keep the incision clean and dry until the staples/sutures are removed. If there is no drainage from the incision you should keep it open to air. If there is drainage from the incision you must keep it covered at all times until the drainage stops  Do not soak in a bath tub, hot tub, pool, lake or other body of water until 21 days after your surgery and your incision is completely dry and healed.  If you have removable sutures (or staples) they must be removed 10-14 days (unless otherwise instructed) from the day of your surgery.     1)  Elevate the  extremity as much as possible.  2)  Keep the dressing clean and dry.  3)  Please call us if the dressing becomes wet or dirty.  4)  If you are experiencing worsening pain or worsening swelling, please call.     MEDICATIONS: Resume all previous home medications at the previous prescribed dose and frequency unless otherwise noted Start taking the  pain medications on an as-needed basis as prescribed  Please taper down pain medication over the next week following surgery.  Ideally you should not require a refill of any narcotic pain medication.  Take pain medication with food to minimize nausea. In addition to the prescribed pain medication, you may take over-the-counter pain relievers such as Tylenol.  Do NOT take additional tylenol if your pain medication already has tylenol in it.  Home Eliquis      FOLLOWUP INSTRUCTIONS: 1. Follow up at the Physical Therapy Clinic 3-4 days following surgery. This appointment should be scheduled unless other arrangements have been made.The Physical Therapy scheduling number is [phone] if an appointment has not already been arranged.  2. Contact Dr. Eddie Dibbles office during office hours at [phone] or the practice after hours line at [phone] for non-emergencies. For medical emergencies call 911.

## 2021-01-04 NOTE — Progress Notes (Signed)
   Subjective:  Pain is well controlled today.  She was able to work with physical therapy yesterday and get the side of the bed.  Tolerating diet.  Voiding.  Objective:   VITALS:   Vitals:   01/03/21 1513 01/03/21 1516 01/03/21 1936 01/03/21 1938  BP: 104/77  106/72 106/72  Pulse: (!) 111 (!) 102 (!) 110 (!) 110  Resp: 20  17 17   Temp: 97.7 F (36.5 C)  98 F (36.7 C) 98 F (36.7 C)  TempSrc: Axillary  Oral Oral  SpO2: 99%  99% 99%  Weight:      Height:        Left hip dressing is clean dry and intact.  Patient spontaneously able to flex and extend the left foot.  Remainder of exam is limited by sleepy status at this point.  Lab Results  Component Value Date   WBC 9.1 01/02/2021   HGB 12.2 01/02/2021   HCT 37.7 01/02/2021   MCV 99.5 01/02/2021   PLT 187 01/02/2021     Assessment/Plan:  2 Days Post-Op status post left hip cephalomedullary nail  - Patient to work with PT/OT to optimize mobilization safely - DVT ppx - SCDs, ambulation, may resume home Eliquis - WBAT operative extremity - Pain control - multimodal pain management, ATC acetaminophen in conjunction with as needed narcotic (oxycodone), although this should be minimized with other modalities  - Discharge planning pending CM, appreciate coordination    Dorismar Chay 01/04/2021, 7:54 AM

## 2021-01-04 NOTE — Social Work (Signed)
CSW called Pts son [name] about discharge planning. [name] did not answer, CSW left message. Pt was recently at [facility] place from 8/25- 9/9. Pts SNF days may be limited. [facility] is willing to accept pt back for SNF if that's what the family wants.   Emeterio Reeve, LCSW Clinical Social Worker

## 2021-01-05 LAB — SARS CORONAVIRUS 2 (TAT 6-24 HRS): SARS Coronavirus 2: NEGATIVE

## 2021-01-05 NOTE — Progress Notes (Signed)
Physical Therapy Treatment Patient Details Name: [name] MRN: [id number] DOB: Oct 20, 1920 Today's Date: 01/05/2021   History of Present Illness [name] is a 87 y.o. female who presents with left hip pain after a fall from standing; s/p L hip ORIF, WBAT;  has a past medical history of CAD (coronary artery disease), native coronary artery, Dyslipidemia, Hypertension, Hypothyroidism (acquired), and PAF (paroxysmal atrial fibrillation) ([place]).    PT Comments    Continuing work on functional mobility and activity tolerance;  Notably improved mentation, cognition, and ability to interact and participate; Good weight bearing response, and push to stand; Still with difficulty weight shifting in standing and stepping;   Reported dizziness sitting EOB; BP supine 110/76;  BP sitting 109/60  Overall progressing well; Anticipate continuing good progress at post-acute rehabilitation.   Recommendations for follow up therapy are one component of a multi-disciplinary discharge planning process, led by the attending physician.  Recommendations may be updated based on patient status, additional functional criteria and insurance authorization.  Follow Up Recommendations  SNF     Equipment Recommendations  Rolling walker with 5" wheels;3in1 (PT)    Recommendations for Other Services       Precautions / Restrictions Precautions Precautions: Fall Restrictions LLE Weight Bearing: Weight bearing as tolerated     Mobility  Bed Mobility Overal bed mobility: Needs Assistance Bed Mobility: Supine to Sit     Supine to sit: +2 for physical assistance;Mod assist     General bed mobility comments: Heavy mod assist to elevate trunk to sit    Transfers Overall transfer level: Needs assistance Equipment used: 2 person hand held assist Transfers: Sit to/from [employer] Transfers Sit to Stand: Mod assist Stand pivot transfers: Max assist;+2 physical assistance       General  transfer comment: Noting good anterior weight shift and push ot stand; light mod assist to power up; Max assist to weight shift and take pivotal steps to the chair  Ambulation/Gait                 Stairs             Wheelchair Mobility    Modified Rankin (Stroke Patients Only)       Balance     Sitting balance-Leahy Scale: Fair       Standing balance-Leahy Scale: Poor                              Cognition Arousal/Alertness: Awake/alert Behavior During Therapy: WFL for tasks assessed/performed Overall Cognitive Status: Impaired/Different from baseline Area of Impairment: Orientation                 Orientation Level: Disoriented to;Time;Situation             General Comments: Noting better cognition and better ability to interact      Exercises      General Comments General comments (skin integrity, edema, etc.): Session conducted on room air, but O2 sats remained above 89%      Pertinent Vitals/Pain Pain Assessment: Faces Faces Pain Scale: Hurts even more Pain Location: L hip with movement Pain Descriptors / Indicators: Aching;Sore Pain Intervention(s): Monitored during session;Repositioned    Home Living                      Prior Function            PT Goals (current goals can  now be found in the care plan section) Acute Rehab PT Goals Patient Stated Goal: OOB for lunch PT Goal Formulation: Patient unable to participate in goal setting Time For Goal Achievement: 01/17/21 Potential to Achieve Goals: Good Progress towards PT goals: Progressing toward goals    Frequency    Min 2X/week      PT Plan Current plan remains appropriate    Co-evaluation              AM-PAC PT "6 Clicks" Mobility   Outcome Measure  Help needed turning from your back to your side while in a flat bed without using bedrails?: A Lot Help needed moving from lying on your back to sitting on the side of a flat bed  without using bedrails?: A Lot Help needed moving to and from a bed to a chair (including a wheelchair)?: A Lot Help needed standing up from a chair using your arms (e.g., wheelchair or bedside chair)?: A Lot Help needed to walk in hospital room?: A Lot Help needed climbing 3-5 steps with a railing? : Total 6 Click Score: 11    End of Session Equipment Utilized During Treatment: Gait belt Activity Tolerance: Patient tolerated treatment well Patient left: in chair;with call bell/phone within reach;with chair alarm set Nurse Communication: Mobility status PT Visit Diagnosis: Unsteadiness on feet (R26.81);Other abnormalities of gait and mobility (R26.89);History of falling (Z91.81);Pain Pain - Right/Left: Left Pain - part of body: Hip     Time: 1202-1223 PT Time Calculation (min) (ACUTE ONLY): 21 min  Charges:  $Therapeutic Activity: 8-22 mins                     Roney Marion, PT  Acute Rehabilitation Services Pager [phone] Office [phone]    Colletta [place] 01/05/2021, 4:02 PM

## 2021-01-05 NOTE — Progress Notes (Signed)
Covid swab has been done. As of 1614, result pending.

## 2021-01-05 NOTE — Progress Notes (Signed)
PROGRESS NOTE  [name] WCB:[id number] DOB: 02-27-1921 DOA: 01/01/2021 PCP: Default, Provider, MD  Brief History   [name] is a 87 y.o. female with medical history significant of CAD s/p stents; HTN; HLD; afib on Eliquis; and hypothyroidism presenting with a fall.  She was recently hospitalized with cholecystitis (11/2020) and discharged to [facility].  She reports that she got up overnight to use the bathroom and her legs buckled underneath her.  Her left leg has throbbing intermittent pain.  She is supposed to use a walker but does not always use it and was not using it last night.  Her son would prefer that she not take medications, particularly ones that she does not need.  He is in agreement with stopping Eliquis.   87 year old female presents after a fall.  She has a left hip fracture and is on Eliquis.  Dr. Lyla Glassing was consulted by EDP and recommended to hold off Eliquis for the next 24 to 36 hours.  Patient received Eliquis 8 hours prior to presentation.  Per orthopedic surgery patient can eat today.    The patient was admitted to a telemetry bed. She was made NPO after midnight and underwent Long cephalomedullary nailing of the left hip this morning. She has tolerated the procedure well. The patient is tolerating PT/OT well.   She is awaiting SNF placement. COVID testing on 01/05/2021.  Consultants  Orthopedic surgery  Procedures  Long cephalomedullary naling of left hip.  Antibiotics   Anti-infectives (From admission, onward)    Start     Dose/Rate Route Frequency Ordered Stop   01/02/21 0600  ceFAZolin (ANCEF) IVPB 2g/100 mL premix        2 g 200 mL/hr over 30 Minutes Intravenous To Short Stay 01/01/21 2244 01/02/21 0758   01/01/21 1100  trimethoprim (TRIMPEX) tablet 100 mg        100 mg Oral Every morning 01/01/21 1052        Subjective  The patient is resting comfortably. No new complaints.  Objective   Vitals:  Vitals:   01/05/21 1415 01/05/21  1609  BP: 109/60 (!) 146/125  Pulse:  88  Resp:  14  Temp:    SpO2:  100%    Exam:  Constitutional:  The patient is resting comfortably. No new complaints.  Respiratory:  CTA bilaterally, no w/r/r.  Respiratory effort normal. No retractions or accessory muscle use Cardiovascular:  RRR, no m/r/g No LE extremity edema   Normal pedal pulses Abdomen:  Abdomen appears normal; no tenderness or masses No hernias No HSM Musculoskeletal:  Digits/nails BUE: no clubbing, cyanosis, petechiae, infection exam of joints, bones, muscles of at least one of following: head/neck, RUE, LUE, RLE, LLE   Skin:  No rashes, lesions, ulcers palpation of skin: no induration or nodules Neurologic:  Patient is awake, alert, and oriented x 3. Moving all extremities CN II- XII grossly intact.  I have personally reviewed the following:   Today's Data  Vitals.  Lab Data    Imaging  X-ray of femur  Cardiology Data  EKG  Scheduled Meds:  apixaban  2.5 mg Oral BID   atorvastatin  10 mg Oral QHS   diltiazem  30 mg Oral TID   docusate sodium  100 mg Oral BID   feeding supplement  237 mL Oral TID BM   levothyroxine  100 mcg Oral Q0600   metoprolol succinate  50 mg Oral Daily   multivitamin with minerals  1 tablet  Oral Daily   ondansetron  4 mg Oral q morning   trimethoprim  100 mg Oral q morning   vitamin B-12  1,000 mcg Oral q morning   Continuous Infusions:  methocarbamol (ROBAXIN) IV      Principal Problem:   Hip fracture, left (HCC) Active Problems:   CAD (coronary artery disease), native coronary artery   Dyslipidemia   Hypertension   Hypothyroidism (acquired)   PAF (paroxysmal atrial fibrillation) (HCC)   LOS: 4 days   A & P  Hip fracture -Mechanical fall resulting in hip fracture -Orthopedics consulted; S/P intramedullary nailing of right hip.  -Pain control with Robxain, Vicodin, and Morphine prn -TOC consult for rehab placement - she is coming from and returning to  [employer] -Will need PT consult post-operatively -Hip fracture order set utilized   Cholecystocolonic fistula, suspected; Acalculus cholecystitis -Recently admitted for this issue -s/p antibiotics -MRCP during last admission with suspected cholecystocolonic fistula not well demonstrated by MR, but notable diffuse gallbladder wall thickening which could reflect a calculus cholecystitis, mild extrahepatic biliary dilation without evidence of choledocholithiasis.  -HIDA scan notable for no filling within the gallbladder but patent common bile duct.   -She was treated with antibiotics -Perc drain was considered but family preferred avoidance of aggressive measures if possible -Appears to be generally stable at this time   Stage 3b CKD -Stable at this time -Will continue to follow electrolytes, creatinine and volume status.   PVD -Proximal SMA stenosis and L SFA origin stenosis noted during last hospitalization -Family prefers conservative care -Continue Lipitor for now, but could consider stopping based on ongoing family discussions   Afib -Rate controlled with Dilt, Metoprolol  -Short acting diltiazem has been restarted. -Will continue metoprolol but change to Toprol XL 50 mg daily and can increase if needed for HR/BP -Eliquis restarted   Hypothyroidism -Continue Synthroid at current dose for now    Underweight: Pt with BMI of 17.5, temporal wasting, and cachexia. Nutrition consulted.  Urinary issues -Hold myrbetriq for now, consider d/c -Continue trimethoprim daily for UTI prophylaxis   I have seen and examined this patient myself. I have spent 34 minutes in her evaluation and care.  DNR -Out of facility DNR form is prominently displayed at the bedside  Note: This patient has been tested and is negative for the novel coronavirus COVID-19. The patient has been fully vaccinated against COVID-19.   Level of care: Med-Surg DVT prophylaxis:  SCDs until approved for Lovenox by  orthopedics Code Status:  DNR - confirmed with patient/family Family Communication: None present initially, but I spoke with her son at the bedside later in the morning Disposition Plan:  SNF once clinically improved   Darwyn Ponzo, DO [facility] Direct contact: see [email or web address]  7PM-7AM contact night coverage as above 01/05/2021, 4:35 PM  LOS: 1 day         j

## 2021-01-05 NOTE — Progress Notes (Signed)
   Subjective:  Pain is well controlled today. Tolerating diet. Resumed home eliquis.  Objective:   VITALS:   Vitals:   01/04/21 0954 01/04/21 2030 01/05/21 0505 01/05/21 0738  BP: (!) 97/56 110/73 109/61 115/70  Pulse: 93 95 (!) 107 78  Resp:  15 15 13   Temp:  98.3 F (36.8 C) (!) 97.4 F (36.3 C) (!) 97.4 F (36.3 C)  TempSrc:  Oral Oral Oral  SpO2:  100% 98% 99%  Weight:      Height:        Left hip dressing is clean dry and intact.  Patient spontaneously able to flex and extend the left foot.  Remainder of exam is limited by sleepy status at this point.  Lab Results  Component Value Date   WBC 9.1 01/02/2021   HGB 12.2 01/02/2021   HCT 37.7 01/02/2021   MCV 99.5 01/02/2021   PLT 187 01/02/2021     Assessment/Plan:  3 Days Post-Op status post left hip cephalomedullary nail  - Patient to work with PT/OT to optimize mobilization safely - DVT ppx - SCDs, ambulation, Eliquis - WBAT operative extremity - Pain control - multimodal pain management, ATC acetaminophen in conjunction with as needed narcotic (oxycodone), although this should be minimized with other modalities    Terin Dierolf 01/05/2021, 7:52 AM

## 2021-01-06 ENCOUNTER — Encounter (HOSPITAL_COMMUNITY): Payer: Self-pay

## 2021-01-06 DIAGNOSIS — R2689 Other abnormalities of gait and mobility: Secondary | ICD-10-CM | POA: Diagnosis not present

## 2021-01-06 DIAGNOSIS — R5381 Other malaise: Secondary | ICD-10-CM | POA: Diagnosis not present

## 2021-01-06 DIAGNOSIS — T391X1A Poisoning by 4-Aminophenol derivatives, accidental (unintentional), initial encounter: Secondary | ICD-10-CM | POA: Diagnosis not present

## 2021-01-06 DIAGNOSIS — K819 Cholecystitis, unspecified: Secondary | ICD-10-CM | POA: Diagnosis not present

## 2021-01-06 DIAGNOSIS — K551 Chronic vascular disorders of intestine: Secondary | ICD-10-CM | POA: Diagnosis not present

## 2021-01-06 DIAGNOSIS — S72002A Fracture of unspecified part of neck of left femur, initial encounter for closed fracture: Secondary | ICD-10-CM | POA: Diagnosis not present

## 2021-01-06 DIAGNOSIS — M6281 Muscle weakness (generalized): Secondary | ICD-10-CM | POA: Diagnosis not present

## 2021-01-06 DIAGNOSIS — E039 Hypothyroidism, unspecified: Secondary | ICD-10-CM | POA: Diagnosis not present

## 2021-01-06 DIAGNOSIS — S72002D Fracture of unspecified part of neck of left femur, subsequent encounter for closed fracture with routine healing: Secondary | ICD-10-CM | POA: Diagnosis not present

## 2021-01-06 DIAGNOSIS — I130 Hypertensive heart and chronic kidney disease with heart failure and stage 1 through stage 4 chronic kidney disease, or unspecified chronic kidney disease: Secondary | ICD-10-CM | POA: Diagnosis not present

## 2021-01-06 DIAGNOSIS — M25552 Pain in left hip: Secondary | ICD-10-CM | POA: Diagnosis not present

## 2021-01-06 DIAGNOSIS — M79605 Pain in left leg: Secondary | ICD-10-CM | POA: Diagnosis not present

## 2021-01-06 DIAGNOSIS — K810 Acute cholecystitis: Secondary | ICD-10-CM | POA: Diagnosis not present

## 2021-01-06 DIAGNOSIS — H8110 Benign paroxysmal vertigo, unspecified ear: Secondary | ICD-10-CM | POA: Diagnosis not present

## 2021-01-06 DIAGNOSIS — R531 Weakness: Secondary | ICD-10-CM | POA: Diagnosis not present

## 2021-01-06 DIAGNOSIS — S72302A Unspecified fracture of shaft of left femur, initial encounter for closed fracture: Secondary | ICD-10-CM | POA: Diagnosis not present

## 2021-01-06 DIAGNOSIS — I4891 Unspecified atrial fibrillation: Secondary | ICD-10-CM | POA: Diagnosis not present

## 2021-01-06 DIAGNOSIS — R41841 Cognitive communication deficit: Secondary | ICD-10-CM | POA: Diagnosis not present

## 2021-01-06 DIAGNOSIS — I5032 Chronic diastolic (congestive) heart failure: Secondary | ICD-10-CM | POA: Diagnosis not present

## 2021-01-06 DIAGNOSIS — I480 Paroxysmal atrial fibrillation: Secondary | ICD-10-CM | POA: Diagnosis not present

## 2021-01-06 DIAGNOSIS — I509 Heart failure, unspecified: Secondary | ICD-10-CM | POA: Diagnosis not present

## 2021-01-06 DIAGNOSIS — I739 Peripheral vascular disease, unspecified: Secondary | ICD-10-CM | POA: Diagnosis not present

## 2021-01-06 DIAGNOSIS — R498 Other voice and resonance disorders: Secondary | ICD-10-CM | POA: Diagnosis not present

## 2021-01-06 DIAGNOSIS — R2681 Unsteadiness on feet: Secondary | ICD-10-CM | POA: Diagnosis not present

## 2021-01-06 DIAGNOSIS — M6258 Muscle wasting and atrophy, not elsewhere classified, other site: Secondary | ICD-10-CM | POA: Diagnosis not present

## 2021-01-06 DIAGNOSIS — R1312 Dysphagia, oropharyngeal phase: Secondary | ICD-10-CM | POA: Diagnosis not present

## 2021-01-06 DIAGNOSIS — R0602 Shortness of breath: Secondary | ICD-10-CM | POA: Diagnosis not present

## 2021-01-06 DIAGNOSIS — Z743 Need for continuous supervision: Secondary | ICD-10-CM | POA: Diagnosis not present

## 2021-01-06 DIAGNOSIS — R279 Unspecified lack of coordination: Secondary | ICD-10-CM | POA: Diagnosis not present

## 2021-01-06 DIAGNOSIS — Z7401 Bed confinement status: Secondary | ICD-10-CM | POA: Diagnosis not present

## 2021-01-06 DIAGNOSIS — R404 Transient alteration of awareness: Secondary | ICD-10-CM | POA: Diagnosis not present

## 2021-01-06 DIAGNOSIS — S72302D Unspecified fracture of shaft of left femur, subsequent encounter for closed fracture with routine healing: Secondary | ICD-10-CM | POA: Diagnosis not present

## 2021-01-06 MED ORDER — APIXABAN 2.5 MG PO TABS: 2.5000 mg | ORAL_TABLET | Freq: Two times a day (BID) | ORAL | Status: AC

## 2021-01-06 MED ORDER — METOPROLOL SUCCINATE ER 50 MG PO TB24: 50.0000 mg | ORAL_TABLET | Freq: Every day | ORAL | Status: AC

## 2021-01-06 MED ORDER — HYDROCODONE-ACETAMINOPHEN 5-325 MG PO TABS: 1.0000 | ORAL_TABLET | Freq: Four times a day (QID) | ORAL | 0 refills | Status: AC | PRN

## 2021-01-06 NOTE — Discharge Summary (Signed)
Physician Discharge Summary  [name] ZHG:[id number] DOB: [birth year] DOA: 01/01/2021  PCP: Default, Provider, MD  Admit date: 01/01/2021 Discharge date: 01/06/2021  Time spent: 37 minutes  Recommendations for Outpatient Follow-up:  Needs Chem-12, CBC in 1 week Recommend consideration for resumption of Lasix in the outpatient setting if gains more than 2 pounds on 48 hours or become short of breath as this was during hospitalization Metoprolol dosing adjusted downwards this hospital stay Weightbearing as per orthopedics--will need to be followed by Dr. Sammuel Hines orthopedics  continue Eliquis for DVT prophylaxis--For chronic A. 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 Roanna Banning note        Cholecysto colonic fistula with acalculous cholecystitis -MRCP last admission noted and HIDA scan no filling defect-treated with antibiotics-family prefers avoiding aggressive  measures  Atrial fibrillation CHADS2 score >4 - Stable during hospitalization - Continue lower dose metoprolol XL 50, continue Cardizem 3 times daily low-dose and resumed Eliquis.  Chronic UTIs - Myrbetriq continued - Trimethoprim resumed on discharge for preventive care of the same  Peripheral vascular disease - Proximal SMA stenosis L SFA stenosis-conservative management continue Lipitor  Prior pulmonary embolism 02/2020 - Continue Eliquis  Hypothyroid - Continue Synthroid recheck in about 3 weeks  BMI 17 with chronic cachexia malnutrition Stable CKD 3B    Discharge Exam: Vitals:   01/06/21 0523 01/06/21 0818  BP: 121/74 (!) 129/92  Pulse: 99 99  Resp: 14 14  Temp: 98.6 F (37 C) 98.9 F (37.2 C)  SpO2: 99% 93%    Subj on day of d/c   Awake coherent needs to go to the restroom no chest pain no fever   General Exam on discharge  EOMI NCAT no focal deficit a little bit in distress that he needs to go to the bathroom Slightly tachycardic--S1-S2 no murmur A. fib on monitor Abdomen slightly distended Skin multiple areas SKs as well as AK's Chest clear no added sound no rales rhonchi ROM intact no lower extremity edema Bruising to left lower extremity with postop wounds as well  Discharge Instructions   Discharge Instructions     Diet - low sodium heart healthy   Complete by: As directed    Increase activity slowly   Complete by: As directed    No wound care   Complete by: As directed       Allergies as of 01/06/2021   No Known Allergies      Medication List     STOP taking these medications    eucerin lotion   furosemide 20  MG tablet Commonly known as: LASIX   Metoprolol Tartrate 75 MG Tabs       TAKE these medications    acetaminophen 500 MG tablet Commonly known as: TYLENOL Take 500 mg by mouth 2 (two) times daily.   apixaban 2.5 MG Tabs tablet Commonly known as: ELIQUIS Take 1 tablet (2.5 mg total) by mouth 2 (two) times  daily. What changed:  when to take this additional instructions   atorvastatin 10 MG tablet Commonly known as: LIPITOR Take 10 mg by mouth at bedtime.   diltiazem 30 MG tablet Commonly known as: CARDIZEM Take 30 mg by mouth 3 (three) times daily. 8am, 2pm, 8pm   HYDROcodone-acetaminophen 5-325 MG tablet Commonly known as: NORCO/VICODIN Take 1-2 tablets by mouth every 6 (six) hours as needed for up to 4 days for moderate pain.   levothyroxine 100 MCG tablet Commonly known as: SYNTHROID Take 100 mcg by mouth daily at 6 (six) AM.   magnesium citrate Soln Take 1 Bottle by mouth daily as needed (constipation).   metoprolol succinate 50 MG 24 hr tablet Commonly known as: TOPROL-XL Take 1 tablet (50 mg total) by mouth daily.   mirabegron ER 50 MG Tb24 tablet Commonly known as: MYRBETRIQ Take 50 mg by mouth at bedtime.   multivitamin with minerals Tabs tablet Take 1 tablet by mouth every morning.   nitroGLYCERIN 0.4 MG SL tablet Commonly known as: NITROSTAT Place 0.4 mg under the tongue every 5 (five) minutes as needed for chest pain.   ondansetron 4 MG tablet Commonly known as: ZOFRAN Take 4 mg by mouth every morning.   polyethylene glycol 17 g packet Commonly known as: MIRALAX / GLYCOLAX Take 17 g by mouth every 12 (twelve) hours as needed (constipation).   Probiotic-10 Ultimate Caps Take 1 capsule by mouth every morning.   senna-docusate 8.6-50 MG tablet Commonly known as: Senokot-S Take 1 tablet by mouth every 12 (twelve) hours as needed (constipation).   trimethoprim 100 MG tablet Commonly known as: TRIMPEX Take 100 mg by mouth every morning.   vitamin B-12 1000 MCG tablet Commonly known as: CYANOCOBALAMIN Take 1,000 mcg by mouth every morning.       No Known Allergies  Follow-up Information     Vanetta Mulders, MD Follow up in 2 week(s).   Specialty: Orthopedic Surgery Contact information: [address] [place]  [address] [phone]                  The results of significant diagnostics from this hospitalization (including imaging, microbiology, ancillary and laboratory) are listed below for reference.    Significant Diagnostic Studies: CT HEAD WO CONTRAST  Result Date: 01/01/2021 CLINICAL DATA:  87 year old female status post fall with hip fracture. On blood thinners. EXAM: CT HEAD WITHOUT CONTRAST TECHNIQUE: Contiguous axial images were obtained from the base of the skull through the vertex without intravenous contrast. COMPARISON:  Brain MRI 08/06/2020.  Head CT 08/05/2020. FINDINGS: Brain: Stable cerebral volume. No midline shift, mass effect, or evidence of intracranial mass lesion. Stable ventricle size and configuration. Stable patchy and confluent bilateral white matter hypodensity, tracking into the deep white matter capsules and thalamus on the left. Stable mild heterogeneity in the brainstem. No acute intracranial hemorrhage identified. No cortically based acute infarct identified. Vascular: Calcified atherosclerosis at the skull base. No suspicious intracranial vascular hyperdensity. 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 No skull fracture identified. Sinuses/Orbits: Mildly increased mucosal thickening and bubbly opacity in  the left maxillary and bilateral sphenoid sinuses since April. Tympanic cavities and mastoids remain clear. Other: No acute orbit or scalp soft tissue injury. IMPRESSION: 1. No acute intracranial abnormality or acute traumatic injury identified. 2. Stable CT appearance of chronic small vessel disease. 3. Mildly increased paranasal sinus inflammation since April. Electronically Signed   By: Genevie Ann M.D.   On: 01/01/2021 05:36   CT CERVICAL SPINE WO CONTRAST  Result Date: 01/01/2021 CLINICAL DATA:  87 year old female status post fall with hip fracture. On blood thinners. EXAM: CT CERVICAL SPINE WITHOUT CONTRAST TECHNIQUE:  Multidetector CT imaging of the cervical spine was performed without intravenous contrast. Multiplanar CT image reconstructions were also generated. COMPARISON:  Head CT today. Cervical spine CT 08/03/2020. FINDINGS: Alignment: Stable since April. Chronic anterolisthesis of C4 on C5 and C7 on T1. Bilateral posterior element alignment is within normal limits. Skull base and vertebrae: Visualized skull base is intact. No atlanto-occipital dissociation. C1 and C2 are chronically degenerated but appears stable and intact. Congenital incomplete ossification of the posterior C1 ring. No acute osseous abnormality identified. Soft tissues and spinal canal: No prevertebral fluid or swelling. No visible canal hematoma. Advanced bilateral cervical carotid calcified atherosclerosis. Disc levels: Chronic severe C1-C2 degeneration including bulky odontoid subchondral cysts, partially calcified ligamentous hypertrophy. Widespread severe facet degeneration. Advanced calcified disc degeneration at C4-C5 in the setting of spondylolisthesis with chronic spinal stenosis there. Bulky chronic ligament flavum hypertrophy and calcification at the cervicothoracic junction, also with a degree of spinal stenosis. Upper chest: Stable. IMPRESSION: 1. No acute traumatic injury identified in the cervical spine. 2. Chronic severe cervical spine degeneration appears stable. 3. Advanced carotid atherosclerosis. Electronically Signed   By: Genevie Ann M.D.   On: 01/01/2021 05:40   DG Chest Port 1 View  Result Date: 01/01/2021 CLINICAL DATA:  87 year old female status post fall with hip fracture. On blood thinners. EXAM: PORTABLE CHEST 1 VIEW COMPARISON:  Portable chest 05/30/2020 and earlier, including CTA abdomen 11/26/2020. FINDINGS: Portable AP semi upright view at 0445 hours. Lower lung volumes. Stable mild cardiomegaly. Calcified aortic atherosclerosis. Other mediastinal contours are within normal limits. Visualized tracheal air column is within  normal limits. Stable elevation of the right hemidiaphragm. Allowing for portable technique the lungs are clear. No pneumothorax or pleural effusion. Osteopenia. Chronic deformity proximal left humerus. No acute osseous abnormality identified. IMPRESSION: No acute cardiopulmonary abnormality. Electronically Signed   By: Genevie Ann M.D.   On: 01/01/2021 05:32   DG Knee Complete 4 Views Left  Result Date: 01/01/2021 CLINICAL DATA:  87 year old female status post fall with left hip fracture. EXAM: LEFT KNEE - 3 VIEW COMPARISON:  Left hip series today. FINDINGS: Each of these three views is oblique due to difficulty with patient positioning in the setting of hip fracture. Advanced calcified peripheral vascular disease. Bone mineralization is within normal limits for age. Distal femur, patella, proximal tibia and fibula appear intact. Joint spaces appear normal for age. No joint effusion is evident. IMPRESSION: 1. No acute fracture or dislocation identified about the left knee. 2. Advanced calcified peripheral vascular disease. Electronically Signed   By: Genevie Ann M.D.   On: 01/01/2021 07:37   DG C-Arm 1-60 Min-No Report  Result Date: 01/02/2021 CLINICAL DATA:  Intramedullary rod fixation of left proximal femur fracture. EXAM: LEFT FEMUR 2 VIEWS; DG C-ARM 1-60 MIN-NO REPORT FLUOROSCOPY TIME:  2 minutes 57 seconds. COMPARISON:  January 01, 2021. FINDINGS: Eleven intraoperative fluoroscopic images were obtained of the left femur. These  images demonstrate intramedullary rod fixation of the left femur for purposes of surgical internal fixation of proximal left femoral intertrochanteric fracture. IMPRESSION: Fluoroscopic guidance provided during intramedullary rod fixation of proximal left femoral intertrochanteric fracture. Electronically Signed   By: Marijo Conception M.D.   On: 01/02/2021 09:36   DG HIP PORT UNILAT WITH PELVIS 1V LEFT  Result Date: 01/01/2021 CLINICAL DATA:  87 year old female status post fall. On  blood thinners. EXAM: DG HIP (WITH OR WITHOUT PELVIS) 1V PORT LEFT COMPARISON:  CTA abdomen and pelvis 11/26/2020. FINDINGS: Portable AP and cross-table lateral views of the pelvis and left hip. Acute left femur intertrochanteric fracture with mild varus impaction. Left femoral head remains normally located. Pelvis appears to remain intact. Grossly intact proximal right femur. Extensive iliofemoral calcified atherosclerosis. Nonobstructed visible bowel gas pattern. IMPRESSION: Acute left femur intertrochanteric fracture with mild varus impaction. Electronically Signed   By: Genevie Ann M.D.   On: 01/01/2021 05:30   DG FEMUR MIN 2 VIEWS LEFT  Result Date: 01/02/2021 CLINICAL DATA:  Intramedullary rod fixation of left proximal femur fracture. EXAM: LEFT FEMUR 2 VIEWS; DG C-ARM 1-60 MIN-NO REPORT FLUOROSCOPY TIME:  2 minutes 57 seconds. COMPARISON:  January 01, 2021. FINDINGS: Eleven intraoperative fluoroscopic images were obtained of the left femur. These images demonstrate intramedullary rod fixation of the left femur for purposes of surgical internal fixation of proximal left femoral intertrochanteric fracture. IMPRESSION: Fluoroscopic guidance provided during intramedullary rod fixation of proximal left femoral intertrochanteric fracture. Electronically Signed   By: Marijo Conception M.D.   On: 01/02/2021 09:36    Microbiology: Recent Results (from the past 240 hour(s))  Resp Panel by RT-PCR (Flu A&B, Covid) Nasopharyngeal Swab     Status: None   Collection Time: 01/01/21  4:39 AM   Specimen: Nasopharyngeal Swab; Nasopharyngeal(NP) swabs in vial transport medium  Result Value Ref Range Status   SARS Coronavirus 2 by RT PCR NEGATIVE NEGATIVE Final    Comment: (NOTE) SARS-CoV-2 target nucleic acids are NOT DETECTED.  The SARS-CoV-2 RNA is generally detectable in upper respiratory specimens during the acute phase of infection. The lowest concentration of SARS-CoV-2 viral copies this assay can detect  is 138 copies/mL. A negative result does not preclude SARS-Cov-2 infection and should not be used as the sole basis for treatment or other patient management decisions. A negative result may occur with  improper specimen collection/handling, submission of specimen other than nasopharyngeal swab, presence of viral mutation(s) within the areas targeted by this assay, and inadequate number of viral copies(<138 copies/mL). A negative result must be combined with clinical observations, patient history, and epidemiological information. The expected result is Negative.  Fact Sheet for Patients:  EntrepreneurPulse.com.au  Fact Sheet for Healthcare Providers:  IncredibleEmployment.be  This test is no t yet approved or cleared by the [place] FDA and  has been authorized for detection and/or diagnosis of SARS-CoV-2 by FDA under an Emergency Use Authorization (EUA). This EUA will remain  in effect (meaning this test can be used) for the duration of the COVID-19 declaration under Section 564(b)(1) of the Act, 21 U.S.C.section 360bbb-3(b)(1), unless the authorization is terminated  or revoked sooner.       Influenza A by PCR NEGATIVE NEGATIVE Final   Influenza B by PCR NEGATIVE NEGATIVE Final    Comment: (NOTE) The Xpert Xpress SARS-CoV-2/FLU/RSV plus assay is intended as an aid in the diagnosis of influenza from Nasopharyngeal swab specimens and should not be used as a sole basis for  treatment. Nasal washings and aspirates are unacceptable for Xpert Xpress SARS-CoV-2/FLU/RSV testing.  Fact Sheet for Patients: EntrepreneurPulse.com.au  Fact Sheet for Healthcare Providers: IncredibleEmployment.be  This test is not yet approved or cleared by the [place] FDA and has been authorized for detection and/or diagnosis of SARS-CoV-2 by FDA under an Emergency Use Authorization (EUA). This EUA will remain in effect  (meaning this test can be used) for the duration of the COVID-19 declaration under Section 564(b)(1) of the Act, 21 U.S.C. section 360bbb-3(b)(1), unless the authorization is terminated or revoked.  Performed at [facility], [place] [address] [place], [place] [address]   Surgical PCR screen     Status: None   Collection Time: 01/01/21 10:04 PM   Specimen: Nasal Mucosa; Nasal Swab  Result Value Ref Range Status   MRSA, PCR NEGATIVE NEGATIVE Final   Staphylococcus aureus NEGATIVE NEGATIVE Final    Comment: (NOTE) The Xpert SA Assay (FDA approved for NASAL specimens in patients 58 years of age and older), is one component of a comprehensive surveillance program. It is not intended to diagnose infection nor to guide or monitor treatment. Performed at [facility], [place] [address], [place], [place] [address]   SARS CORONAVIRUS 2 (TAT 6-24 HRS) Nasopharyngeal Nasopharyngeal Swab     Status: None   Collection Time: 01/05/21 11:46 AM   Specimen: Nasopharyngeal Swab  Result Value Ref Range Status   SARS Coronavirus 2 NEGATIVE NEGATIVE Final    Comment: (NOTE) SARS-CoV-2 target nucleic acids are NOT DETECTED.  The SARS-CoV-2 RNA is generally detectable in upper and lower respiratory specimens during the acute phase of infection. Negative results do not preclude SARS-CoV-2 infection, do not rule out co-infections with other pathogens, and should not be used as the sole basis for treatment or other patient management decisions. Negative results must be combined with clinical observations, patient history, and epidemiological information. The expected result is Negative.  Fact Sheet for Patients: SugarRoll.be  Fact Sheet for Healthcare Providers: https://www.woods-mathews.com/  This test is not yet approved or cleared by the [place] FDA and  has been authorized for detection and/or diagnosis of SARS-CoV-2 by FDA under an  Emergency Use Authorization (EUA). This EUA will remain  in effect (meaning this test can be used) for the duration of the COVID-19 declaration under Se ction 564(b)(1) of the Act, 21 U.S.C. section 360bbb-3(b)(1), unless the authorization is terminated or revoked sooner.  Performed at [facility], [place] [address], [place], [place] [address]      Labs: Basic Metabolic Panel: Recent Labs  Lab 01/01/21 0457 01/01/21 0502 01/02/21 0317 01/03/21 0204 01/04/21 0117  NA 138 140 137 137 134*  K 4.6 4.5 4.8 5.1 5.1  CL 102 102 101 100 99  CO2 27  --  28 27 29   GLUCOSE 110* 107* 103* 115* 95  BUN 19 21 24* 31* 42*  CREATININE 1.26* 1.30* 1.29* 1.47* 1.41*  CALCIUM 9.4  --  8.9 8.8* 8.5*   Liver Function Tests: Recent Labs  Lab 01/01/21 0457  AST 21  ALT 14  ALKPHOS 77  BILITOT 0.2*  PROT 7.2  ALBUMIN 3.4*   No results for input(s): LIPASE, AMYLASE in the last 168 hours. No results for input(s): AMMONIA in the last 168 hours. CBC: Recent Labs  Lab 01/01/21 0457 01/01/21 0502 01/02/21 0317  WBC 7.5  --  9.1  HGB 14.0 15.3* 12.2  HCT 45.0 45.0 37.7  MCV 100.9*  --  99.5  PLT 219  --  187   Cardiac Enzymes: No results for input(s): CKTOTAL, CKMB, CKMBINDEX, TROPONINI in the last 168 hours. BNP: BNP (last 3 results) No results for input(s): BNP in the last 8760 hours.  ProBNP (last 3 results) No results for input(s): PROBNP in the last 8760 hours.  CBG: No results for input(s): GLUCAP in the last 168 hours.     Signed:  Nita Sells MD   [facility] 01/06/2021, 9:19 AM

## 2021-01-06 NOTE — Hospital Course (Signed)
89 paroxysmal atrial fibrillation on anticoagulation pulmonary embolism 02/2020 -- Eliquis,  CKD stage IIIa, chronic diastolic congestive heart failure, CAD [07/2007 PCI/Promus DES to LAD; 02/2010 PCI/Promus DES to dRCA.], proximal SMA disease,  Prior lung nodule Known cholecysto colonic disease treated medically 11/2020  Recent discharge to [facility] skilled facility-legs buckled under her patient had fall found to have left hip fracture

## 2021-01-06 NOTE — TOC Transition Note (Signed)
Transition of Care [facility]) - CM/SW Discharge Note   Patient Details  Name: [name] MRN: [id number] Date of Birth: 05-12-1920  [facility]) CM/SW Contact:  Emeterio Reeve, LCSW Phone Number: 01/06/2021, 2:48 PM   Clinical Narrative:     Patient will DC to: [facility] Anticipated DC date: 01/06/21 Family notified: Son Transport by: Corey Harold     Per MD patient ready for DC to  [facility]. RN, patient, patient's family, and facility notified of DC. Discharge Summary and FL2 sent to facility. DC packet on chart. Insurance Josem Kaufmann has been received and pt is covid negative. Ambulance transport requested for patient.    RN to call report to [phone].  CSW will sign off for now as social work intervention is no longer needed. Please consult [place] again if new needs arise.   Final next level of care: Skilled Nursing Facility Barriers to Discharge: Barriers Resolved   Patient Goals and CMS Choice Patient states their goals for this hospitalization and ongoing recovery are:: to get stronger CMS Medicare.gov Compare Post Acute Care list provided to:: Patient Choice offered to / list presented to : Patient  Discharge Placement              Patient chooses bed at: [facility] Patient to be transferred to facility by: ptar Name of family member notified: son Patient and family notified of of transfer: 01/06/21  Discharge Plan and Services                                     Social Determinants of Health (SDOH) Interventions     Readmission Risk Interventions No flowsheet data found.   Emeterio Reeve, LCSW Clinical Social Worker

## 2021-01-06 NOTE — Progress Notes (Signed)
Discharge to SNF, transported by [facility].

## 2021-01-06 NOTE — Progress Notes (Signed)
Report given to Diane, Therapist, sports at [facility].

## 2021-01-06 NOTE — Plan of Care (Signed)

## 2021-01-06 NOTE — TOC Progression Note (Signed)
Transition of Care [facility]) - Progression Note    Patient Details  Name: [name] MRN: [id number] Date of Birth: 1920/07/20  Transition of Care [facility]) CM/SW Contact  Emeterio Reeve, [place] Phone Number: 01/06/2021, 10:30 AM  Clinical Narrative:     CSW spoke to [facility] advantage via phone. Pts insurance Josem Kaufmann has been sent to [employer] for review. Pts PTAR [employer] has been approved, number [id number].   Expected Discharge Plan: [place] Barriers to Discharge: Continued Medical Work up  Expected Discharge Plan and Services Expected Discharge Plan: [place] arrangements for the past 2 months: [place] Expected Discharge Date: 01/06/21                                     Social Determinants of Health (SDOH) Interventions    Readmission Risk Interventions No flowsheet data found.  Emeterio Reeve, LCSW Clinical Social Worker

## 2021-01-07 DIAGNOSIS — K819 Cholecystitis, unspecified: Secondary | ICD-10-CM | POA: Diagnosis not present

## 2021-01-07 DIAGNOSIS — I739 Peripheral vascular disease, unspecified: Secondary | ICD-10-CM | POA: Diagnosis not present

## 2021-01-07 DIAGNOSIS — S72302A Unspecified fracture of shaft of left femur, initial encounter for closed fracture: Secondary | ICD-10-CM | POA: Diagnosis not present

## 2021-01-07 DIAGNOSIS — K551 Chronic vascular disorders of intestine: Secondary | ICD-10-CM | POA: Diagnosis not present

## 2021-01-10 ENCOUNTER — Other Ambulatory Visit (HOSPITAL_BASED_OUTPATIENT_CLINIC_OR_DEPARTMENT_OTHER): Payer: Self-pay | Admitting: Orthopaedic Surgery

## 2021-01-10 DIAGNOSIS — S72002D Fracture of unspecified part of neck of left femur, subsequent encounter for closed fracture with routine healing: Secondary | ICD-10-CM

## 2021-01-11 ENCOUNTER — Ambulatory Visit (INDEPENDENT_AMBULATORY_CARE_PROVIDER_SITE_OTHER): Payer: PPO | Admitting: Orthopaedic Surgery

## 2021-01-11 ENCOUNTER — Other Ambulatory Visit (HOSPITAL_BASED_OUTPATIENT_CLINIC_OR_DEPARTMENT_OTHER): Payer: Self-pay

## 2021-01-11 ENCOUNTER — Other Ambulatory Visit: Payer: Self-pay

## 2021-01-11 VITALS — Ht 66.0 in | Wt 110.0 lb

## 2021-01-11 DIAGNOSIS — S72002D Fracture of unspecified part of neck of left femur, subsequent encounter for closed fracture with routine healing: Secondary | ICD-10-CM

## 2021-01-11 MED ORDER — OXYCODONE HCL 5 MG PO TABS
5.0000 mg | ORAL_TABLET | ORAL | 0 refills | Status: AC | PRN
  Filled 2021-01-11: qty 30, 5d supply, fill #0

## 2021-01-11 NOTE — Progress Notes (Signed)
Post Operative Evaluation    Procedure/Date of Surgery: 01/02/21  Interval History:  [name] presents today 2 weeks status post cephalomedullary nailing.  She has been at rehab since she was discharged from the hospital.  Overall she has been minimally putting weight on the left hip.  She does have persistent pain in the left hip.  I spoke with her son [name] who has been visiting her frequently.  She has been up to the chair although this has been minimally   PMH/PSH/Family History/Social History/Meds/Allergies:    Past Medical History:  Diagnosis Date   CAD (coronary artery disease)    a. 07/2007 PCI/Promus DES to LAD;  b. 02/2010 PCI/Promus DES to dRCA.;  c. 12/2010 Lexi MV: EF 88%, no isch/infarct.;  d. 02/2012 Cath: LM nl, LAD 20p, patent mid stent, 60d, D1 sm 70ost, D2 sm, diff, LCX sm , OM1 diff 65m (no change), RCA 40ost, patent stent, PD/PL nl, EF 75% w/o rwma's.   CAD (coronary artery disease), native coronary artery    s/p stents   Carotid bruit    Diverticulosis    Dyslipidemia    GERD (gastroesophageal reflux disease)    Hemorrhoids    History of echocardiogram    a. 06/2012 Echo: EF nl, mild LVH.   HLD (hyperlipidemia)    HTN (hypertension)    Hypertension    Hypothyroidism    Hypothyroidism (acquired)    IBS (irritable bowel syndrome)    PAF (paroxysmal atrial fibrillation) ([place])    on Eliquis   Past Surgical History:  Procedure Laterality Date   BACK SURGERY     CORONARY ANGIOPLASTY WITH STENT PLACEMENT     hysterectomy - unknown type     INTRAMEDULLARY (IM) NAIL INTERTROCHANTERIC Left 01/02/2021   Procedure: INTRAMEDULLARY (IM) NAIL INTERTROCHANTRIC;  Surgeon: Vanetta Mulders, MD;  Location: [place];  Service: Orthopedics;  Laterality: Left;   LEFT HEART CATHETERIZATION WITH CORONARY ANGIOGRAM N/A 02/13/2012   Procedure: LEFT HEART CATHETERIZATION WITH CORONARY ANGIOGRAM;  Surgeon: Josue Hector, MD;  Location: [facility] CATH LAB;  Service:  Cardiovascular;  Laterality: N/A;   RECTOCELE REPAIR     Social History   Socioeconomic History   Marital status: Unknown    Spouse name: Not on file   Number of children: 4   Years of education: Not on file   Highest education level: Not on file  Occupational History   Occupation: retired  Tobacco Use   Smoking status: Never   Smokeless tobacco: Never  Vaping Use   Vaping Use: Never used  Substance and Sexual Activity   Alcohol use: Not Currently   Drug use: Never   Sexual activity: Not on file  Other Topics Concern   Not on file  Social History Narrative   ** Merged History Encounter **       ** Merged History Encounter **       Lives in [place].   Social Determinants of Health   Financial Resource Strain: Not on file  Food Insecurity: Not on file  Transportation Needs: Not on file  Physical Activity: Not on file  Stress: Not on file  Social Connections: Not on file   Family History  Problem Relation Age of Onset   Myasthenia gravis Mother    Hip fracture Father    Heart disease  Other        several uncles with CAD   No Known Allergies Current Outpatient Medications  Medication Sig Dispense Refill   acetaminophen (TYLENOL) 500 MG tablet Take 1,000 mg by mouth every 8 (eight) hours as needed (for pain).     acetaminophen (TYLENOL) 500 MG tablet Take 500 mg by mouth 2 (two) times daily.     apixaban (ELIQUIS) 2.5 MG TABS tablet Take 1 tablet (2.5 mg total) by mouth 2 (two) times daily. 180 tablet 1   apixaban (ELIQUIS) 2.5 MG TABS tablet Take 1 tablet (2.5 mg total) by mouth 2 (two) times daily. 60 tablet    atorvastatin (LIPITOR) 10 MG tablet Take 1 tablet (10 mg total) by mouth daily. 30 tablet 0   atorvastatin (LIPITOR) 10 MG tablet Take 10 mg by mouth at bedtime.     diltiazem (CARDIZEM) 30 MG tablet Take 1 tablet (30 mg total) by mouth 3 (three) times daily.     diltiazem (CARDIZEM) 30 MG tablet Take 30 mg by mouth 3 (three) times daily. 8am, 2pm, 8pm      feeding supplement (ENSURE ENLIVE / ENSURE PLUS) LIQD Take 237 mLs by mouth 3 (three) times daily between meals. (Patient not taking: No sig reported) 237 mL 12   furosemide (LASIX) 20 MG tablet Take 1 tablet (20 mg total) by mouth every Monday, Wednesday, and Friday. (Patient taking differently: Take 10 mg by mouth See admin instructions. 10mg  oral daily on Tuesday, Thursday, Saturday, Sunday) 30 tablet    levothyroxine (SYNTHROID) 100 MCG tablet Take 100 mcg by mouth daily at 6 (six) AM.     levothyroxine (SYNTHROID, LEVOTHROID) 100 MCG tablet Take 100 mcg by mouth daily.     magnesium citrate SOLN Take 296 mLs (1 Bottle total) by mouth daily as needed for severe constipation. 195 mL    magnesium citrate SOLN Take 1 Bottle by mouth daily as needed (constipation).     metoprolol succinate (TOPROL-XL) 50 MG 24 hr tablet Take 1 tablet (50 mg total) by mouth daily.     metoprolol tartrate (LOPRESSOR) 100 MG tablet Take 100 mg by mouth 2 (two) times daily.     mirabegron ER (MYRBETRIQ) 50 MG TB24 tablet Take 50 mg by mouth at bedtime.     Multiple Vitamin (MULTIVITAMIN WITH MINERALS) TABS tablet Take 1 tablet by mouth every morning.     Multiple Vitamins-Minerals (ONE-A-DAY PROACTIVE 65+ PO) Take 1 tablet by mouth at bedtime.     MYRBETRIQ 50 MG TB24 tablet Take 50 mg by mouth every evening.      nitroGLYCERIN (NITROSTAT) 0.4 MG SL tablet Place 0.4 mg under the tongue every 5 (five) minutes as needed. For chest pain     nitroGLYCERIN (NITROSTAT) 0.4 MG SL tablet Place 0.4 mg under the tongue every 5 (five) minutes as needed for chest pain.     ondansetron (ZOFRAN) 4 MG tablet Take 4 mg by mouth every morning.     OVER THE COUNTER MEDICATION 3 (three) times daily. MedPass 2.0     polyethylene glycol (MIRALAX / GLYCOLAX) 17 g packet Take 17 g by mouth 2 (two) times daily as needed for mild constipation. 14 each 0   polyethylene glycol (MIRALAX / GLYCOLAX) 17 g packet Take 17 g by mouth every 12 (twelve)  hours as needed (constipation).     Probiotic Product (PROBIOTIC-10 ULTIMATE) CAPS Take 1 capsule by mouth every morning.     senna-docusate (SENOKOT-S) 8.6-50 MG tablet Take  1 tablet by mouth 2 (two) times daily as needed for moderate constipation.     senna-docusate (SENOKOT-S) 8.6-50 MG tablet Take 1 tablet by mouth every 12 (twelve) hours as needed (constipation).     trimethoprim (TRIMPEX) 100 MG tablet Take 100 mg by mouth in the morning.     trimethoprim (TRIMPEX) 100 MG tablet Take 100 mg by mouth every morning.     vitamin B-12 (CYANOCOBALAMIN) 1000 MCG tablet Take 1,000 mcg by mouth daily.     vitamin B-12 (CYANOCOBALAMIN) 1000 MCG tablet Take 1,000 mcg by mouth every morning.     No current facility-administered medications for this visit.   No results found.  Review of Systems:   A ROS was performed including pertinent positives and negatives as documented in the HPI.   Musculoskeletal Exam:    Height 5\' 6"  (1.676 m), weight 110 lb (49.9 kg).  Left incisions are clean dry and intact.  She is able to dorsiflex and plantarflex at the foot.  She does have some pain with logroll of the left hip.  Sensation is intact in all distributions distally.  Toes are warm well perfused  Imaging:    None  I personally reviewed and interpreted the radiographs.   Assessment:   87 year old female with left hip cephalomedullary nailing performed 2 weeks prior she is recovering somewhat slowly.  I have advised on the paperwork to her rehab that I would like her to be up to chair minimum of once daily for at least a few hours.  I will also provide with additional pain medication today  Plan :    -Return to clinic 1 month with x-rays of the left hip and femur      I personally saw and evaluated the patient, and participated in the management and treatment plan.  Vanetta Mulders, MD Attending Physician, Orthopedic Surgery  This document was dictated using Dragon voice recognition  software. A reasonable attempt at proof reading has been made to minimize errors.

## 2021-01-15 DIAGNOSIS — T391X1A Poisoning by 4-Aminophenol derivatives, accidental (unintentional), initial encounter: Secondary | ICD-10-CM | POA: Diagnosis not present

## 2021-01-15 DIAGNOSIS — S72302A Unspecified fracture of shaft of left femur, initial encounter for closed fracture: Secondary | ICD-10-CM | POA: Diagnosis not present

## 2021-01-15 DIAGNOSIS — M79605 Pain in left leg: Secondary | ICD-10-CM | POA: Diagnosis not present

## 2021-01-15 DIAGNOSIS — I5032 Chronic diastolic (congestive) heart failure: Secondary | ICD-10-CM | POA: Diagnosis not present

## 2021-01-26 DIAGNOSIS — I739 Peripheral vascular disease, unspecified: Secondary | ICD-10-CM | POA: Diagnosis not present

## 2021-01-26 DIAGNOSIS — I5032 Chronic diastolic (congestive) heart failure: Secondary | ICD-10-CM | POA: Diagnosis not present

## 2021-01-26 DIAGNOSIS — E039 Hypothyroidism, unspecified: Secondary | ICD-10-CM | POA: Diagnosis not present

## 2021-01-26 DIAGNOSIS — I4891 Unspecified atrial fibrillation: Secondary | ICD-10-CM | POA: Diagnosis not present

## 2021-01-28 DIAGNOSIS — I4891 Unspecified atrial fibrillation: Secondary | ICD-10-CM | POA: Diagnosis not present

## 2021-01-28 DIAGNOSIS — I5032 Chronic diastolic (congestive) heart failure: Secondary | ICD-10-CM | POA: Diagnosis not present

## 2021-01-28 DIAGNOSIS — S72302D Unspecified fracture of shaft of left femur, subsequent encounter for closed fracture with routine healing: Secondary | ICD-10-CM | POA: Diagnosis not present

## 2021-01-28 DIAGNOSIS — E039 Hypothyroidism, unspecified: Secondary | ICD-10-CM | POA: Diagnosis not present

## 2021-02-08 DIAGNOSIS — N3281 Overactive bladder: Secondary | ICD-10-CM | POA: Diagnosis not present

## 2021-02-08 DIAGNOSIS — E782 Mixed hyperlipidemia: Secondary | ICD-10-CM | POA: Diagnosis not present

## 2021-02-08 DIAGNOSIS — W19XXXA Unspecified fall, initial encounter: Secondary | ICD-10-CM | POA: Diagnosis not present

## 2021-02-08 DIAGNOSIS — M9702XA Periprosthetic fracture around internal prosthetic left hip joint, initial encounter: Secondary | ICD-10-CM | POA: Diagnosis not present

## 2021-02-08 DIAGNOSIS — I480 Paroxysmal atrial fibrillation: Secondary | ICD-10-CM | POA: Diagnosis not present

## 2021-02-08 DIAGNOSIS — E039 Hypothyroidism, unspecified: Secondary | ICD-10-CM | POA: Diagnosis not present

## 2021-02-09 ENCOUNTER — Other Ambulatory Visit: Payer: Self-pay

## 2021-02-09 ENCOUNTER — Ambulatory Visit (HOSPITAL_BASED_OUTPATIENT_CLINIC_OR_DEPARTMENT_OTHER)
Admission: RE | Admit: 2021-02-09 | Discharge: 2021-02-09 | Disposition: A | Discharge status: Home / Self Care | Payer: PPO | Source: Ambulatory Visit | Attending: Orthopaedic Surgery | Admitting: Orthopaedic Surgery

## 2021-02-09 ENCOUNTER — Ambulatory Visit (INDEPENDENT_AMBULATORY_CARE_PROVIDER_SITE_OTHER): Payer: PPO | Admitting: Orthopaedic Surgery

## 2021-02-09 DIAGNOSIS — S72002D Fracture of unspecified part of neck of left femur, subsequent encounter for closed fracture with routine healing: Secondary | ICD-10-CM

## 2021-02-09 DIAGNOSIS — S72002A Fracture of unspecified part of neck of left femur, initial encounter for closed fracture: Secondary | ICD-10-CM | POA: Diagnosis not present

## 2021-02-09 NOTE — Progress Notes (Signed)
Post Operative Evaluation    Procedure/Date of Surgery: 01/02/21  Interval History:   02/09/2021: [name] presents today overall pain is much better at this time.  She is transition back to her previous retirement community.  She has not been able to have home visiting therapy to assist with getting up.  She predominately sits and her walker the majority of the day.  She is here with her son [name].  [name] presents today 2 weeks status post cephalomedullary nailing.  She has been at rehab since she was discharged from the hospital.  Overall she has been minimally putting weight on the left hip.  She does have persistent pain in the left hip.  I spoke with her son [name] who has been visiting her frequently.  She has been up to the chair although this has been minimally   PMH/PSH/Family History/Social History/Meds/Allergies:    Past Medical History:  Diagnosis Date   CAD (coronary artery disease)    a. 07/2007 PCI/Promus DES to LAD;  b. 02/2010 PCI/Promus DES to dRCA.;  c. 12/2010 Lexi MV: EF 88%, no isch/infarct.;  d. 02/2012 Cath: LM nl, LAD 20p, patent mid stent, 60d, D1 sm 70ost, D2 sm, diff, LCX sm , OM1 diff 53m (no change), RCA 40ost, patent stent, PD/PL nl, EF 75% w/o rwma's.   CAD (coronary artery disease), native coronary artery    s/p stents   Carotid bruit    Diverticulosis    Dyslipidemia    GERD (gastroesophageal reflux disease)    Hemorrhoids    History of echocardiogram    a. 06/2012 Echo: EF nl, mild LVH.   HLD (hyperlipidemia)    HTN (hypertension)    Hypertension    Hypothyroidism    Hypothyroidism (acquired)    IBS (irritable bowel syndrome)    PAF (paroxysmal atrial fibrillation) ([place])    on Eliquis   Past Surgical History:  Procedure Laterality Date   BACK SURGERY     CORONARY ANGIOPLASTY WITH STENT PLACEMENT     hysterectomy - unknown type     INTRAMEDULLARY (IM) NAIL INTERTROCHANTERIC Left 01/02/2021   Procedure: INTRAMEDULLARY  (IM) NAIL INTERTROCHANTRIC;  Surgeon: Vanetta Mulders, MD;  Location: [place];  Service: Orthopedics;  Laterality: Left;   LEFT HEART CATHETERIZATION WITH CORONARY ANGIOGRAM N/A 02/13/2012   Procedure: LEFT HEART CATHETERIZATION WITH CORONARY ANGIOGRAM;  Surgeon: Josue Hector, MD;  Location: [facility] CATH LAB;  Service: Cardiovascular;  Laterality: N/A;   RECTOCELE REPAIR     Social History   Socioeconomic History   Marital status: Unknown    Spouse name: Not on file   Number of children: 4   Years of education: Not on file   Highest education level: Not on file  Occupational History   Occupation: retired  Tobacco Use   Smoking status: Never   Smokeless tobacco: Never  Vaping Use   Vaping Use: Never used  Substance and Sexual Activity   Alcohol use: Not Currently   Drug use: Never   Sexual activity: Not on file  Other Topics Concern   Not on file  Social History Narrative   ** Merged History Encounter **       ** Merged History Encounter **       Lives in [place].   Social Determinants of Health   Financial  Resource Strain: Not on file  Food Insecurity: Not on file  Transportation Needs: Not on file  Physical Activity: Not on file  Stress: Not on file  Social Connections: Not on file   Family History  Problem Relation Age of Onset   Myasthenia gravis Mother    Hip fracture Father    Heart disease Other        several uncles with CAD   No Known Allergies Current Outpatient Medications  Medication Sig Dispense Refill   acetaminophen (TYLENOL) 500 MG tablet Take 1,000 mg by mouth every 8 (eight) hours as needed (for pain).     acetaminophen (TYLENOL) 500 MG tablet Take 500 mg by mouth 2 (two) times daily.     apixaban (ELIQUIS) 2.5 MG TABS tablet Take 1 tablet (2.5 mg total) by mouth 2 (two) times daily. 180 tablet 1   apixaban (ELIQUIS) 2.5 MG TABS tablet Take 1 tablet (2.5 mg total) by mouth 2 (two) times daily. 60 tablet    atorvastatin (LIPITOR) 10 MG tablet Take 1  tablet (10 mg total) by mouth daily. 30 tablet 0   atorvastatin (LIPITOR) 10 MG tablet Take 10 mg by mouth at bedtime.     diltiazem (CARDIZEM) 30 MG tablet Take 1 tablet (30 mg total) by mouth 3 (three) times daily.     diltiazem (CARDIZEM) 30 MG tablet Take 30 mg by mouth 3 (three) times daily. 8am, 2pm, 8pm     feeding supplement (ENSURE ENLIVE / ENSURE PLUS) LIQD Take 237 mLs by mouth 3 (three) times daily between meals. (Patient not taking: No sig reported) 237 mL 12   furosemide (LASIX) 20 MG tablet Take 1 tablet (20 mg total) by mouth every Monday, Wednesday, and Friday. (Patient taking differently: Take 10 mg by mouth See admin instructions. 10mg  oral daily on Tuesday, Thursday, Saturday, Sunday) 30 tablet    levothyroxine (SYNTHROID) 100 MCG tablet Take 100 mcg by mouth daily at 6 (six) AM.     levothyroxine (SYNTHROID, LEVOTHROID) 100 MCG tablet Take 100 mcg by mouth daily.     magnesium citrate SOLN Take 296 mLs (1 Bottle total) by mouth daily as needed for severe constipation. 195 mL    magnesium citrate SOLN Take 1 Bottle by mouth daily as needed (constipation).     metoprolol succinate (TOPROL-XL) 50 MG 24 hr tablet Take 1 tablet (50 mg total) by mouth daily.     metoprolol tartrate (LOPRESSOR) 100 MG tablet Take 100 mg by mouth 2 (two) times daily.     mirabegron ER (MYRBETRIQ) 50 MG TB24 tablet Take 50 mg by mouth at bedtime.     Multiple Vitamin (MULTIVITAMIN WITH MINERALS) TABS tablet Take 1 tablet by mouth every morning.     Multiple Vitamins-Minerals (ONE-A-DAY PROACTIVE 65+ PO) Take 1 tablet by mouth at bedtime.     MYRBETRIQ 50 MG TB24 tablet Take 50 mg by mouth every evening.      nitroGLYCERIN (NITROSTAT) 0.4 MG SL tablet Place 0.4 mg under the tongue every 5 (five) minutes as needed. For chest pain     nitroGLYCERIN (NITROSTAT) 0.4 MG SL tablet Place 0.4 mg under the tongue every 5 (five) minutes as needed for chest pain.     ondansetron (ZOFRAN) 4 MG tablet Take 4 mg by  mouth every morning.     OVER THE COUNTER MEDICATION 3 (three) times daily. MedPass 2.0     oxyCODONE (OXY IR/ROXICODONE) 5 MG immediate release tablet Take 1 tablet (5 mg  total) by mouth every 4 (four) hours as needed for severe pain. 30 tablet 0   polyethylene glycol (MIRALAX / GLYCOLAX) 17 g packet Take 17 g by mouth 2 (two) times daily as needed for mild constipation. 14 each 0   polyethylene glycol (MIRALAX / GLYCOLAX) 17 g packet Take 17 g by mouth every 12 (twelve) hours as needed (constipation).     Probiotic Product (PROBIOTIC-10 ULTIMATE) CAPS Take 1 capsule by mouth every morning.     senna-docusate (SENOKOT-S) 8.6-50 MG tablet Take 1 tablet by mouth 2 (two) times daily as needed for moderate constipation.     senna-docusate (SENOKOT-S) 8.6-50 MG tablet Take 1 tablet by mouth every 12 (twelve) hours as needed (constipation).     trimethoprim (TRIMPEX) 100 MG tablet Take 100 mg by mouth in the morning.     trimethoprim (TRIMPEX) 100 MG tablet Take 100 mg by mouth every morning.     vitamin B-12 (CYANOCOBALAMIN) 1000 MCG tablet Take 1,000 mcg by mouth daily.     vitamin B-12 (CYANOCOBALAMIN) 1000 MCG tablet Take 1,000 mcg by mouth every morning.     No current facility-administered medications for this visit.   No results found.  Review of Systems:   A ROS was performed including pertinent positives and negatives as documented in the HPI.   Musculoskeletal Exam:    There were no vitals taken for this visit.  Left incisions are clean dry and intact.  She is able to dorsiflex and plantarflex at the foot.  She does have some pain with logroll of the left hip.  Sensation is intact in all distributions distally.  Toes are warm well perfused  Imaging:    None  I personally reviewed and interpreted the radiographs.   Assessment:   87 year old female with left hip cephalomedullary nailing performed 6 weeks prior.  Overall she appears much better today.  She is in better spirits.   I would like therapy to come visit her facility so that she may be up and out of bed at least daily.  This would be with the assistance of a walker. Plan :    -Return to clinic 1 month for additional check to assess mobility status      I personally saw and evaluated the patient, and participated in the management and treatment plan.  Vanetta Mulders, MD Attending Physician, Orthopedic Surgery  This document was dictated using Dragon voice recognition software. A reasonable attempt at proof reading has been made to minimize errors.

## 2021-02-10 DIAGNOSIS — Z79899 Other long term (current) drug therapy: Secondary | ICD-10-CM | POA: Diagnosis not present

## 2021-02-10 DIAGNOSIS — W19XXXD Unspecified fall, subsequent encounter: Secondary | ICD-10-CM | POA: Diagnosis not present

## 2021-02-10 DIAGNOSIS — N183 Chronic kidney disease, stage 3 unspecified: Secondary | ICD-10-CM | POA: Diagnosis not present

## 2021-02-10 DIAGNOSIS — I130 Hypertensive heart and chronic kidney disease with heart failure and stage 1 through stage 4 chronic kidney disease, or unspecified chronic kidney disease: Secondary | ICD-10-CM | POA: Diagnosis not present

## 2021-02-10 DIAGNOSIS — E46 Unspecified protein-calorie malnutrition: Secondary | ICD-10-CM | POA: Diagnosis not present

## 2021-02-10 DIAGNOSIS — E785 Hyperlipidemia, unspecified: Secondary | ICD-10-CM | POA: Diagnosis not present

## 2021-02-10 DIAGNOSIS — K219 Gastro-esophageal reflux disease without esophagitis: Secondary | ICD-10-CM | POA: Diagnosis not present

## 2021-02-10 DIAGNOSIS — S72142D Displaced intertrochanteric fracture of left femur, subsequent encounter for closed fracture with routine healing: Secondary | ICD-10-CM | POA: Diagnosis not present

## 2021-02-10 DIAGNOSIS — N3281 Overactive bladder: Secondary | ICD-10-CM | POA: Diagnosis not present

## 2021-02-10 DIAGNOSIS — I5032 Chronic diastolic (congestive) heart failure: Secondary | ICD-10-CM | POA: Diagnosis not present

## 2021-02-10 DIAGNOSIS — I739 Peripheral vascular disease, unspecified: Secondary | ICD-10-CM | POA: Diagnosis not present

## 2021-02-10 DIAGNOSIS — Z79891 Long term (current) use of opiate analgesic: Secondary | ICD-10-CM | POA: Diagnosis not present

## 2021-02-10 DIAGNOSIS — I2510 Atherosclerotic heart disease of native coronary artery without angina pectoris: Secondary | ICD-10-CM | POA: Diagnosis not present

## 2021-02-10 DIAGNOSIS — Z7901 Long term (current) use of anticoagulants: Secondary | ICD-10-CM | POA: Diagnosis not present

## 2021-02-10 DIAGNOSIS — E039 Hypothyroidism, unspecified: Secondary | ICD-10-CM | POA: Diagnosis not present

## 2021-02-10 DIAGNOSIS — Z87440 Personal history of urinary (tract) infections: Secondary | ICD-10-CM | POA: Diagnosis not present

## 2021-02-10 DIAGNOSIS — K589 Irritable bowel syndrome without diarrhea: Secondary | ICD-10-CM | POA: Diagnosis not present

## 2021-02-10 DIAGNOSIS — Z9181 History of falling: Secondary | ICD-10-CM | POA: Diagnosis not present

## 2021-02-10 DIAGNOSIS — I480 Paroxysmal atrial fibrillation: Secondary | ICD-10-CM | POA: Diagnosis not present

## 2021-02-15 DIAGNOSIS — N3281 Overactive bladder: Secondary | ICD-10-CM | POA: Diagnosis not present

## 2021-02-15 DIAGNOSIS — I480 Paroxysmal atrial fibrillation: Secondary | ICD-10-CM | POA: Diagnosis not present

## 2021-02-15 DIAGNOSIS — E039 Hypothyroidism, unspecified: Secondary | ICD-10-CM | POA: Diagnosis not present

## 2021-02-19 DIAGNOSIS — K589 Irritable bowel syndrome without diarrhea: Secondary | ICD-10-CM | POA: Diagnosis not present

## 2021-02-19 DIAGNOSIS — N3281 Overactive bladder: Secondary | ICD-10-CM | POA: Diagnosis not present

## 2021-02-19 DIAGNOSIS — W19XXXD Unspecified fall, subsequent encounter: Secondary | ICD-10-CM | POA: Diagnosis not present

## 2021-02-19 DIAGNOSIS — I480 Paroxysmal atrial fibrillation: Secondary | ICD-10-CM | POA: Diagnosis not present

## 2021-02-19 DIAGNOSIS — Z87440 Personal history of urinary (tract) infections: Secondary | ICD-10-CM | POA: Diagnosis not present

## 2021-02-19 DIAGNOSIS — K219 Gastro-esophageal reflux disease without esophagitis: Secondary | ICD-10-CM | POA: Diagnosis not present

## 2021-02-19 DIAGNOSIS — I5032 Chronic diastolic (congestive) heart failure: Secondary | ICD-10-CM | POA: Diagnosis not present

## 2021-02-19 DIAGNOSIS — Z79891 Long term (current) use of opiate analgesic: Secondary | ICD-10-CM | POA: Diagnosis not present

## 2021-02-19 DIAGNOSIS — Z7901 Long term (current) use of anticoagulants: Secondary | ICD-10-CM | POA: Diagnosis not present

## 2021-02-19 DIAGNOSIS — E785 Hyperlipidemia, unspecified: Secondary | ICD-10-CM | POA: Diagnosis not present

## 2021-02-19 DIAGNOSIS — E46 Unspecified protein-calorie malnutrition: Secondary | ICD-10-CM | POA: Diagnosis not present

## 2021-02-19 DIAGNOSIS — N183 Chronic kidney disease, stage 3 unspecified: Secondary | ICD-10-CM | POA: Diagnosis not present

## 2021-02-19 DIAGNOSIS — I2510 Atherosclerotic heart disease of native coronary artery without angina pectoris: Secondary | ICD-10-CM | POA: Diagnosis not present

## 2021-02-19 DIAGNOSIS — Z9181 History of falling: Secondary | ICD-10-CM | POA: Diagnosis not present

## 2021-02-19 DIAGNOSIS — S72142D Displaced intertrochanteric fracture of left femur, subsequent encounter for closed fracture with routine healing: Secondary | ICD-10-CM | POA: Diagnosis not present

## 2021-02-19 DIAGNOSIS — I739 Peripheral vascular disease, unspecified: Secondary | ICD-10-CM | POA: Diagnosis not present

## 2021-02-19 DIAGNOSIS — I130 Hypertensive heart and chronic kidney disease with heart failure and stage 1 through stage 4 chronic kidney disease, or unspecified chronic kidney disease: Secondary | ICD-10-CM | POA: Diagnosis not present

## 2021-02-19 DIAGNOSIS — E039 Hypothyroidism, unspecified: Secondary | ICD-10-CM | POA: Diagnosis not present

## 2021-02-19 DIAGNOSIS — Z79899 Other long term (current) drug therapy: Secondary | ICD-10-CM | POA: Diagnosis not present

## 2021-02-23 DIAGNOSIS — K219 Gastro-esophageal reflux disease without esophagitis: Secondary | ICD-10-CM | POA: Diagnosis not present

## 2021-02-23 DIAGNOSIS — S72142D Displaced intertrochanteric fracture of left femur, subsequent encounter for closed fracture with routine healing: Secondary | ICD-10-CM | POA: Diagnosis not present

## 2021-02-23 DIAGNOSIS — I480 Paroxysmal atrial fibrillation: Secondary | ICD-10-CM | POA: Diagnosis not present

## 2021-02-23 DIAGNOSIS — I739 Peripheral vascular disease, unspecified: Secondary | ICD-10-CM | POA: Diagnosis not present

## 2021-02-23 DIAGNOSIS — Z79891 Long term (current) use of opiate analgesic: Secondary | ICD-10-CM | POA: Diagnosis not present

## 2021-02-23 DIAGNOSIS — N183 Chronic kidney disease, stage 3 unspecified: Secondary | ICD-10-CM | POA: Diagnosis not present

## 2021-02-23 DIAGNOSIS — W19XXXD Unspecified fall, subsequent encounter: Secondary | ICD-10-CM | POA: Diagnosis not present

## 2021-02-23 DIAGNOSIS — K589 Irritable bowel syndrome without diarrhea: Secondary | ICD-10-CM | POA: Diagnosis not present

## 2021-02-23 DIAGNOSIS — I130 Hypertensive heart and chronic kidney disease with heart failure and stage 1 through stage 4 chronic kidney disease, or unspecified chronic kidney disease: Secondary | ICD-10-CM | POA: Diagnosis not present

## 2021-02-23 DIAGNOSIS — Z7901 Long term (current) use of anticoagulants: Secondary | ICD-10-CM | POA: Diagnosis not present

## 2021-02-23 DIAGNOSIS — E039 Hypothyroidism, unspecified: Secondary | ICD-10-CM | POA: Diagnosis not present

## 2021-02-23 DIAGNOSIS — E46 Unspecified protein-calorie malnutrition: Secondary | ICD-10-CM | POA: Diagnosis not present

## 2021-02-23 DIAGNOSIS — Z79899 Other long term (current) drug therapy: Secondary | ICD-10-CM | POA: Diagnosis not present

## 2021-02-23 DIAGNOSIS — Z9181 History of falling: Secondary | ICD-10-CM | POA: Diagnosis not present

## 2021-02-23 DIAGNOSIS — E785 Hyperlipidemia, unspecified: Secondary | ICD-10-CM | POA: Diagnosis not present

## 2021-02-23 DIAGNOSIS — N3281 Overactive bladder: Secondary | ICD-10-CM | POA: Diagnosis not present

## 2021-02-23 DIAGNOSIS — I5032 Chronic diastolic (congestive) heart failure: Secondary | ICD-10-CM | POA: Diagnosis not present

## 2021-02-23 DIAGNOSIS — I2510 Atherosclerotic heart disease of native coronary artery without angina pectoris: Secondary | ICD-10-CM | POA: Diagnosis not present

## 2021-02-23 DIAGNOSIS — Z87440 Personal history of urinary (tract) infections: Secondary | ICD-10-CM | POA: Diagnosis not present

## 2021-02-26 DIAGNOSIS — N183 Chronic kidney disease, stage 3 unspecified: Secondary | ICD-10-CM | POA: Diagnosis not present

## 2021-02-26 DIAGNOSIS — Z7901 Long term (current) use of anticoagulants: Secondary | ICD-10-CM | POA: Diagnosis not present

## 2021-02-26 DIAGNOSIS — E46 Unspecified protein-calorie malnutrition: Secondary | ICD-10-CM | POA: Diagnosis not present

## 2021-02-26 DIAGNOSIS — K219 Gastro-esophageal reflux disease without esophagitis: Secondary | ICD-10-CM | POA: Diagnosis not present

## 2021-02-26 DIAGNOSIS — E785 Hyperlipidemia, unspecified: Secondary | ICD-10-CM | POA: Diagnosis not present

## 2021-02-26 DIAGNOSIS — I480 Paroxysmal atrial fibrillation: Secondary | ICD-10-CM | POA: Diagnosis not present

## 2021-02-26 DIAGNOSIS — W19XXXD Unspecified fall, subsequent encounter: Secondary | ICD-10-CM | POA: Diagnosis not present

## 2021-02-26 DIAGNOSIS — I739 Peripheral vascular disease, unspecified: Secondary | ICD-10-CM | POA: Diagnosis not present

## 2021-02-26 DIAGNOSIS — Z79891 Long term (current) use of opiate analgesic: Secondary | ICD-10-CM | POA: Diagnosis not present

## 2021-02-26 DIAGNOSIS — E039 Hypothyroidism, unspecified: Secondary | ICD-10-CM | POA: Diagnosis not present

## 2021-02-26 DIAGNOSIS — I2510 Atherosclerotic heart disease of native coronary artery without angina pectoris: Secondary | ICD-10-CM | POA: Diagnosis not present

## 2021-02-26 DIAGNOSIS — I5032 Chronic diastolic (congestive) heart failure: Secondary | ICD-10-CM | POA: Diagnosis not present

## 2021-02-26 DIAGNOSIS — N3281 Overactive bladder: Secondary | ICD-10-CM | POA: Diagnosis not present

## 2021-02-26 DIAGNOSIS — I130 Hypertensive heart and chronic kidney disease with heart failure and stage 1 through stage 4 chronic kidney disease, or unspecified chronic kidney disease: Secondary | ICD-10-CM | POA: Diagnosis not present

## 2021-02-26 DIAGNOSIS — Z79899 Other long term (current) drug therapy: Secondary | ICD-10-CM | POA: Diagnosis not present

## 2021-02-26 DIAGNOSIS — Z87440 Personal history of urinary (tract) infections: Secondary | ICD-10-CM | POA: Diagnosis not present

## 2021-02-26 DIAGNOSIS — K589 Irritable bowel syndrome without diarrhea: Secondary | ICD-10-CM | POA: Diagnosis not present

## 2021-02-26 DIAGNOSIS — Z9181 History of falling: Secondary | ICD-10-CM | POA: Diagnosis not present

## 2021-02-26 DIAGNOSIS — S72142D Displaced intertrochanteric fracture of left femur, subsequent encounter for closed fracture with routine healing: Secondary | ICD-10-CM | POA: Diagnosis not present

## 2021-03-01 DIAGNOSIS — F4321 Adjustment disorder with depressed mood: Secondary | ICD-10-CM | POA: Diagnosis not present

## 2021-03-02 DIAGNOSIS — I2510 Atherosclerotic heart disease of native coronary artery without angina pectoris: Secondary | ICD-10-CM | POA: Diagnosis not present

## 2021-03-02 DIAGNOSIS — Z87440 Personal history of urinary (tract) infections: Secondary | ICD-10-CM | POA: Diagnosis not present

## 2021-03-02 DIAGNOSIS — K589 Irritable bowel syndrome without diarrhea: Secondary | ICD-10-CM | POA: Diagnosis not present

## 2021-03-02 DIAGNOSIS — Z7901 Long term (current) use of anticoagulants: Secondary | ICD-10-CM | POA: Diagnosis not present

## 2021-03-02 DIAGNOSIS — I480 Paroxysmal atrial fibrillation: Secondary | ICD-10-CM | POA: Diagnosis not present

## 2021-03-02 DIAGNOSIS — E46 Unspecified protein-calorie malnutrition: Secondary | ICD-10-CM | POA: Diagnosis not present

## 2021-03-02 DIAGNOSIS — I130 Hypertensive heart and chronic kidney disease with heart failure and stage 1 through stage 4 chronic kidney disease, or unspecified chronic kidney disease: Secondary | ICD-10-CM | POA: Diagnosis not present

## 2021-03-02 DIAGNOSIS — I739 Peripheral vascular disease, unspecified: Secondary | ICD-10-CM | POA: Diagnosis not present

## 2021-03-02 DIAGNOSIS — W19XXXD Unspecified fall, subsequent encounter: Secondary | ICD-10-CM | POA: Diagnosis not present

## 2021-03-02 DIAGNOSIS — N3281 Overactive bladder: Secondary | ICD-10-CM | POA: Diagnosis not present

## 2021-03-02 DIAGNOSIS — Z9181 History of falling: Secondary | ICD-10-CM | POA: Diagnosis not present

## 2021-03-02 DIAGNOSIS — S72142D Displaced intertrochanteric fracture of left femur, subsequent encounter for closed fracture with routine healing: Secondary | ICD-10-CM | POA: Diagnosis not present

## 2021-03-02 DIAGNOSIS — Z79899 Other long term (current) drug therapy: Secondary | ICD-10-CM | POA: Diagnosis not present

## 2021-03-02 DIAGNOSIS — E039 Hypothyroidism, unspecified: Secondary | ICD-10-CM | POA: Diagnosis not present

## 2021-03-02 DIAGNOSIS — N183 Chronic kidney disease, stage 3 unspecified: Secondary | ICD-10-CM | POA: Diagnosis not present

## 2021-03-02 DIAGNOSIS — K219 Gastro-esophageal reflux disease without esophagitis: Secondary | ICD-10-CM | POA: Diagnosis not present

## 2021-03-02 DIAGNOSIS — E785 Hyperlipidemia, unspecified: Secondary | ICD-10-CM | POA: Diagnosis not present

## 2021-03-02 DIAGNOSIS — I5032 Chronic diastolic (congestive) heart failure: Secondary | ICD-10-CM | POA: Diagnosis not present

## 2021-03-02 DIAGNOSIS — Z79891 Long term (current) use of opiate analgesic: Secondary | ICD-10-CM | POA: Diagnosis not present

## 2021-03-08 ENCOUNTER — Other Ambulatory Visit (HOSPITAL_BASED_OUTPATIENT_CLINIC_OR_DEPARTMENT_OTHER): Payer: Self-pay | Admitting: Orthopaedic Surgery

## 2021-03-08 DIAGNOSIS — S72002D Fracture of unspecified part of neck of left femur, subsequent encounter for closed fracture with routine healing: Secondary | ICD-10-CM

## 2021-03-09 ENCOUNTER — Ambulatory Visit (HOSPITAL_BASED_OUTPATIENT_CLINIC_OR_DEPARTMENT_OTHER)
Admission: RE | Admit: 2021-03-09 | Discharge: 2021-03-09 | Disposition: A | Discharge status: Home / Self Care | Payer: PPO | Source: Ambulatory Visit | Attending: Orthopaedic Surgery | Admitting: Orthopaedic Surgery

## 2021-03-09 ENCOUNTER — Ambulatory Visit (INDEPENDENT_AMBULATORY_CARE_PROVIDER_SITE_OTHER): Payer: PPO | Admitting: Orthopaedic Surgery

## 2021-03-09 ENCOUNTER — Other Ambulatory Visit: Payer: Self-pay

## 2021-03-09 ENCOUNTER — Other Ambulatory Visit (HOSPITAL_BASED_OUTPATIENT_CLINIC_OR_DEPARTMENT_OTHER): Payer: Self-pay | Admitting: Orthopaedic Surgery

## 2021-03-09 DIAGNOSIS — Z9889 Other specified postprocedural states: Secondary | ICD-10-CM | POA: Diagnosis not present

## 2021-03-09 DIAGNOSIS — S72002D Fracture of unspecified part of neck of left femur, subsequent encounter for closed fracture with routine healing: Secondary | ICD-10-CM | POA: Insufficient documentation

## 2021-03-09 DIAGNOSIS — S72142D Displaced intertrochanteric fracture of left femur, subsequent encounter for closed fracture with routine healing: Secondary | ICD-10-CM | POA: Diagnosis not present

## 2021-03-09 NOTE — Progress Notes (Signed)
Post Operative Evaluation    Procedure/Date of Surgery: 01/02/21  Interval History:   03/09/2021: [name] presents today overall pain is much better at this time.  She is here with her son [name].  She has been walking at least twice a week at the encouragement of her son [name].  Overall pain is minimal at this time.    PMH/PSH/Family History/Social History/Meds/Allergies:    Past Medical History:  Diagnosis Date   CAD (coronary artery disease)    a. 07/2007 PCI/Promus DES to LAD;  b. 02/2010 PCI/Promus DES to dRCA.;  c. 12/2010 Lexi MV: EF 88%, no isch/infarct.;  d. 02/2012 Cath: LM nl, LAD 20p, patent mid stent, 60d, D1 sm 70ost, D2 sm, diff, LCX sm , OM1 diff 45m (no change), RCA 40ost, patent stent, PD/PL nl, EF 75% w/o rwma's.   CAD (coronary artery disease), native coronary artery    s/p stents   Carotid bruit    Diverticulosis    Dyslipidemia    GERD (gastroesophageal reflux disease)    Hemorrhoids    History of echocardiogram    a. 06/2012 Echo: EF nl, mild LVH.   HLD (hyperlipidemia)    HTN (hypertension)    Hypertension    Hypothyroidism    Hypothyroidism (acquired)    IBS (irritable bowel syndrome)    PAF (paroxysmal atrial fibrillation) ([place])    on Eliquis   Past Surgical History:  Procedure Laterality Date   BACK SURGERY     CORONARY ANGIOPLASTY WITH STENT PLACEMENT     hysterectomy - unknown type     INTRAMEDULLARY (IM) NAIL INTERTROCHANTERIC Left 01/02/2021   Procedure: INTRAMEDULLARY (IM) NAIL INTERTROCHANTRIC;  Surgeon: Vanetta Mulders, MD;  Location: [place];  Service: Orthopedics;  Laterality: Left;   LEFT HEART CATHETERIZATION WITH CORONARY ANGIOGRAM N/A 02/13/2012   Procedure: LEFT HEART CATHETERIZATION WITH CORONARY ANGIOGRAM;  Surgeon: Josue Hector, MD;  Location: [facility] CATH LAB;  Service: Cardiovascular;  Laterality: N/A;   RECTOCELE REPAIR     Social History   Socioeconomic History   Marital status: Unknown    Spouse name:  Not on file   Number of children: 4   Years of education: Not on file   Highest education level: Not on file  Occupational History   Occupation: retired  Tobacco Use   Smoking status: Never   Smokeless tobacco: Never  Vaping Use   Vaping Use: Never used  Substance and Sexual Activity   Alcohol use: Not Currently   Drug use: Never   Sexual activity: Not on file  Other Topics Concern   Not on file  Social History Narrative   ** Merged History Encounter **       ** Merged History Encounter **       Lives in [place].   Social Determinants of Health   Financial Resource Strain: Not on file  Food Insecurity: Not on file  Transportation Needs: Not on file  Physical Activity: Not on file  Stress: Not on file  Social Connections: Not on file   Family History  Problem Relation Age of Onset   Myasthenia gravis Mother    Hip fracture Father    Heart disease Other        several uncles with CAD   No Known Allergies Current Outpatient Medications  Medication Sig  Dispense Refill   acetaminophen (TYLENOL) 500 MG tablet Take 1,000 mg by mouth every 8 (eight) hours as needed (for pain).     acetaminophen (TYLENOL) 500 MG tablet Take 500 mg by mouth 2 (two) times daily.     apixaban (ELIQUIS) 2.5 MG TABS tablet Take 1 tablet (2.5 mg total) by mouth 2 (two) times daily. 180 tablet 1   apixaban (ELIQUIS) 2.5 MG TABS tablet Take 1 tablet (2.5 mg total) by mouth 2 (two) times daily. 60 tablet    atorvastatin (LIPITOR) 10 MG tablet Take 1 tablet (10 mg total) by mouth daily. 30 tablet 0   atorvastatin (LIPITOR) 10 MG tablet Take 10 mg by mouth at bedtime.     diltiazem (CARDIZEM) 30 MG tablet Take 1 tablet (30 mg total) by mouth 3 (three) times daily.     diltiazem (CARDIZEM) 30 MG tablet Take 30 mg by mouth 3 (three) times daily. 8am, 2pm, 8pm     feeding supplement (ENSURE ENLIVE / ENSURE PLUS) LIQD Take 237 mLs by mouth 3 (three) times daily between meals. (Patient not taking: No sig  reported) 237 mL 12   furosemide (LASIX) 20 MG tablet Take 1 tablet (20 mg total) by mouth every Monday, Wednesday, and Friday. (Patient taking differently: Take 10 mg by mouth See admin instructions. 10mg  oral daily on Tuesday, Thursday, Saturday, Sunday) 30 tablet    levothyroxine (SYNTHROID) 100 MCG tablet Take 100 mcg by mouth daily at 6 (six) AM.     levothyroxine (SYNTHROID, LEVOTHROID) 100 MCG tablet Take 100 mcg by mouth daily.     magnesium citrate SOLN Take 296 mLs (1 Bottle total) by mouth daily as needed for severe constipation. 195 mL    magnesium citrate SOLN Take 1 Bottle by mouth daily as needed (constipation).     metoprolol succinate (TOPROL-XL) 50 MG 24 hr tablet Take 1 tablet (50 mg total) by mouth daily.     metoprolol tartrate (LOPRESSOR) 100 MG tablet Take 100 mg by mouth 2 (two) times daily.     mirabegron ER (MYRBETRIQ) 50 MG TB24 tablet Take 50 mg by mouth at bedtime.     Multiple Vitamin (MULTIVITAMIN WITH MINERALS) TABS tablet Take 1 tablet by mouth every morning.     Multiple Vitamins-Minerals (ONE-A-DAY PROACTIVE 65+ PO) Take 1 tablet by mouth at bedtime.     MYRBETRIQ 50 MG TB24 tablet Take 50 mg by mouth every evening.      nitroGLYCERIN (NITROSTAT) 0.4 MG SL tablet Place 0.4 mg under the tongue every 5 (five) minutes as needed. For chest pain     nitroGLYCERIN (NITROSTAT) 0.4 MG SL tablet Place 0.4 mg under the tongue every 5 (five) minutes as needed for chest pain.     ondansetron (ZOFRAN) 4 MG tablet Take 4 mg by mouth every morning.     OVER THE COUNTER MEDICATION 3 (three) times daily. MedPass 2.0     oxyCODONE (OXY IR/ROXICODONE) 5 MG immediate release tablet Take 1 tablet (5 mg total) by mouth every 4 (four) hours as needed for severe pain. 30 tablet 0   polyethylene glycol (MIRALAX / GLYCOLAX) 17 g packet Take 17 g by mouth 2 (two) times daily as needed for mild constipation. 14 each 0   polyethylene glycol (MIRALAX / GLYCOLAX) 17 g packet Take 17 g by mouth  every 12 (twelve) hours as needed (constipation).     Probiotic Product (PROBIOTIC-10 ULTIMATE) CAPS Take 1 capsule by mouth every morning.  senna-docusate (SENOKOT-S) 8.6-50 MG tablet Take 1 tablet by mouth 2 (two) times daily as needed for moderate constipation.     senna-docusate (SENOKOT-S) 8.6-50 MG tablet Take 1 tablet by mouth every 12 (twelve) hours as needed (constipation).     trimethoprim (TRIMPEX) 100 MG tablet Take 100 mg by mouth in the morning.     trimethoprim (TRIMPEX) 100 MG tablet Take 100 mg by mouth every morning.     vitamin B-12 (CYANOCOBALAMIN) 1000 MCG tablet Take 1,000 mcg by mouth daily.     vitamin B-12 (CYANOCOBALAMIN) 1000 MCG tablet Take 1,000 mcg by mouth every morning.     No current facility-administered medications for this visit.   No results found.  Review of Systems:   A ROS was performed including pertinent positives and negatives as documented in the HPI.   Musculoskeletal Exam:    There were no vitals taken for this visit.  Left incisions are clean dry and intact.  She is able to dorsiflex and plantarflex at the foot.  She does have some pain with logroll of the left hip.  Sensation is intact in all distributions distally.  Toes are warm well perfused  Imaging:    None  I personally reviewed and interpreted the radiographs.   Assessment:   87 year old female with left hip cephalomedullary nailing performed 2 months prior.  At this time her hip fracture is healed.  I would like her to continue to be activity as tolerated. Plan :    -She will follow-up with me on an as-needed basis      I personally saw and evaluated the patient, and participated in the management and treatment plan.  Vanetta Mulders, MD Attending Physician, Orthopedic Surgery  This document was dictated using Dragon voice recognition software. A reasonable attempt at proof reading has been made to minimize errors.

## 2021-03-10 DIAGNOSIS — K589 Irritable bowel syndrome without diarrhea: Secondary | ICD-10-CM | POA: Diagnosis not present

## 2021-03-10 DIAGNOSIS — E785 Hyperlipidemia, unspecified: Secondary | ICD-10-CM | POA: Diagnosis not present

## 2021-03-10 DIAGNOSIS — I5032 Chronic diastolic (congestive) heart failure: Secondary | ICD-10-CM | POA: Diagnosis not present

## 2021-03-10 DIAGNOSIS — I739 Peripheral vascular disease, unspecified: Secondary | ICD-10-CM | POA: Diagnosis not present

## 2021-03-10 DIAGNOSIS — I480 Paroxysmal atrial fibrillation: Secondary | ICD-10-CM | POA: Diagnosis not present

## 2021-03-10 DIAGNOSIS — N183 Chronic kidney disease, stage 3 unspecified: Secondary | ICD-10-CM | POA: Diagnosis not present

## 2021-03-10 DIAGNOSIS — Z87440 Personal history of urinary (tract) infections: Secondary | ICD-10-CM | POA: Diagnosis not present

## 2021-03-10 DIAGNOSIS — Z79891 Long term (current) use of opiate analgesic: Secondary | ICD-10-CM | POA: Diagnosis not present

## 2021-03-10 DIAGNOSIS — W19XXXD Unspecified fall, subsequent encounter: Secondary | ICD-10-CM | POA: Diagnosis not present

## 2021-03-10 DIAGNOSIS — Z79899 Other long term (current) drug therapy: Secondary | ICD-10-CM | POA: Diagnosis not present

## 2021-03-10 DIAGNOSIS — S72142D Displaced intertrochanteric fracture of left femur, subsequent encounter for closed fracture with routine healing: Secondary | ICD-10-CM | POA: Diagnosis not present

## 2021-03-10 DIAGNOSIS — E46 Unspecified protein-calorie malnutrition: Secondary | ICD-10-CM | POA: Diagnosis not present

## 2021-03-10 DIAGNOSIS — K219 Gastro-esophageal reflux disease without esophagitis: Secondary | ICD-10-CM | POA: Diagnosis not present

## 2021-03-10 DIAGNOSIS — E039 Hypothyroidism, unspecified: Secondary | ICD-10-CM | POA: Diagnosis not present

## 2021-03-10 DIAGNOSIS — I130 Hypertensive heart and chronic kidney disease with heart failure and stage 1 through stage 4 chronic kidney disease, or unspecified chronic kidney disease: Secondary | ICD-10-CM | POA: Diagnosis not present

## 2021-03-10 DIAGNOSIS — Z7901 Long term (current) use of anticoagulants: Secondary | ICD-10-CM | POA: Diagnosis not present

## 2021-03-10 DIAGNOSIS — N3281 Overactive bladder: Secondary | ICD-10-CM | POA: Diagnosis not present

## 2021-03-10 DIAGNOSIS — Z9181 History of falling: Secondary | ICD-10-CM | POA: Diagnosis not present

## 2021-03-10 DIAGNOSIS — I2510 Atherosclerotic heart disease of native coronary artery without angina pectoris: Secondary | ICD-10-CM | POA: Diagnosis not present

## 2021-03-11 DIAGNOSIS — E785 Hyperlipidemia, unspecified: Secondary | ICD-10-CM | POA: Diagnosis not present

## 2021-03-11 DIAGNOSIS — S72142D Displaced intertrochanteric fracture of left femur, subsequent encounter for closed fracture with routine healing: Secondary | ICD-10-CM | POA: Diagnosis not present

## 2021-03-11 DIAGNOSIS — K589 Irritable bowel syndrome without diarrhea: Secondary | ICD-10-CM | POA: Diagnosis not present

## 2021-03-11 DIAGNOSIS — N3281 Overactive bladder: Secondary | ICD-10-CM | POA: Diagnosis not present

## 2021-03-11 DIAGNOSIS — Z87440 Personal history of urinary (tract) infections: Secondary | ICD-10-CM | POA: Diagnosis not present

## 2021-03-11 DIAGNOSIS — I130 Hypertensive heart and chronic kidney disease with heart failure and stage 1 through stage 4 chronic kidney disease, or unspecified chronic kidney disease: Secondary | ICD-10-CM | POA: Diagnosis not present

## 2021-03-11 DIAGNOSIS — I2510 Atherosclerotic heart disease of native coronary artery without angina pectoris: Secondary | ICD-10-CM | POA: Diagnosis not present

## 2021-03-11 DIAGNOSIS — I5032 Chronic diastolic (congestive) heart failure: Secondary | ICD-10-CM | POA: Diagnosis not present

## 2021-03-11 DIAGNOSIS — N183 Chronic kidney disease, stage 3 unspecified: Secondary | ICD-10-CM | POA: Diagnosis not present

## 2021-03-11 DIAGNOSIS — K219 Gastro-esophageal reflux disease without esophagitis: Secondary | ICD-10-CM | POA: Diagnosis not present

## 2021-03-11 DIAGNOSIS — I480 Paroxysmal atrial fibrillation: Secondary | ICD-10-CM | POA: Diagnosis not present

## 2021-03-11 DIAGNOSIS — Z79891 Long term (current) use of opiate analgesic: Secondary | ICD-10-CM | POA: Diagnosis not present

## 2021-03-11 DIAGNOSIS — Z79899 Other long term (current) drug therapy: Secondary | ICD-10-CM | POA: Diagnosis not present

## 2021-03-11 DIAGNOSIS — I739 Peripheral vascular disease, unspecified: Secondary | ICD-10-CM | POA: Diagnosis not present

## 2021-03-11 DIAGNOSIS — Z7901 Long term (current) use of anticoagulants: Secondary | ICD-10-CM | POA: Diagnosis not present

## 2021-03-11 DIAGNOSIS — W19XXXD Unspecified fall, subsequent encounter: Secondary | ICD-10-CM | POA: Diagnosis not present

## 2021-03-11 DIAGNOSIS — Z9181 History of falling: Secondary | ICD-10-CM | POA: Diagnosis not present

## 2021-03-11 DIAGNOSIS — E039 Hypothyroidism, unspecified: Secondary | ICD-10-CM | POA: Diagnosis not present

## 2021-03-11 DIAGNOSIS — E46 Unspecified protein-calorie malnutrition: Secondary | ICD-10-CM | POA: Diagnosis not present

## 2021-03-15 DIAGNOSIS — I2510 Atherosclerotic heart disease of native coronary artery without angina pectoris: Secondary | ICD-10-CM | POA: Diagnosis not present

## 2021-03-15 DIAGNOSIS — W19XXXD Unspecified fall, subsequent encounter: Secondary | ICD-10-CM | POA: Diagnosis not present

## 2021-03-15 DIAGNOSIS — N3281 Overactive bladder: Secondary | ICD-10-CM | POA: Diagnosis not present

## 2021-03-15 DIAGNOSIS — E46 Unspecified protein-calorie malnutrition: Secondary | ICD-10-CM | POA: Diagnosis not present

## 2021-03-15 DIAGNOSIS — Z7901 Long term (current) use of anticoagulants: Secondary | ICD-10-CM | POA: Diagnosis not present

## 2021-03-15 DIAGNOSIS — S72142D Displaced intertrochanteric fracture of left femur, subsequent encounter for closed fracture with routine healing: Secondary | ICD-10-CM | POA: Diagnosis not present

## 2021-03-15 DIAGNOSIS — Z79891 Long term (current) use of opiate analgesic: Secondary | ICD-10-CM | POA: Diagnosis not present

## 2021-03-15 DIAGNOSIS — I480 Paroxysmal atrial fibrillation: Secondary | ICD-10-CM | POA: Diagnosis not present

## 2021-03-15 DIAGNOSIS — I739 Peripheral vascular disease, unspecified: Secondary | ICD-10-CM | POA: Diagnosis not present

## 2021-03-15 DIAGNOSIS — Z79899 Other long term (current) drug therapy: Secondary | ICD-10-CM | POA: Diagnosis not present

## 2021-03-15 DIAGNOSIS — N183 Chronic kidney disease, stage 3 unspecified: Secondary | ICD-10-CM | POA: Diagnosis not present

## 2021-03-15 DIAGNOSIS — K589 Irritable bowel syndrome without diarrhea: Secondary | ICD-10-CM | POA: Diagnosis not present

## 2021-03-15 DIAGNOSIS — E785 Hyperlipidemia, unspecified: Secondary | ICD-10-CM | POA: Diagnosis not present

## 2021-03-15 DIAGNOSIS — Z87440 Personal history of urinary (tract) infections: Secondary | ICD-10-CM | POA: Diagnosis not present

## 2021-03-15 DIAGNOSIS — K219 Gastro-esophageal reflux disease without esophagitis: Secondary | ICD-10-CM | POA: Diagnosis not present

## 2021-03-15 DIAGNOSIS — E782 Mixed hyperlipidemia: Secondary | ICD-10-CM | POA: Diagnosis not present

## 2021-03-15 DIAGNOSIS — Z9181 History of falling: Secondary | ICD-10-CM | POA: Diagnosis not present

## 2021-03-15 DIAGNOSIS — I130 Hypertensive heart and chronic kidney disease with heart failure and stage 1 through stage 4 chronic kidney disease, or unspecified chronic kidney disease: Secondary | ICD-10-CM | POA: Diagnosis not present

## 2021-03-15 DIAGNOSIS — E039 Hypothyroidism, unspecified: Secondary | ICD-10-CM | POA: Diagnosis not present

## 2021-03-15 DIAGNOSIS — I5032 Chronic diastolic (congestive) heart failure: Secondary | ICD-10-CM | POA: Diagnosis not present

## 2021-03-17 DIAGNOSIS — I5032 Chronic diastolic (congestive) heart failure: Secondary | ICD-10-CM | POA: Diagnosis not present

## 2021-03-17 DIAGNOSIS — S72142D Displaced intertrochanteric fracture of left femur, subsequent encounter for closed fracture with routine healing: Secondary | ICD-10-CM | POA: Diagnosis not present

## 2021-03-17 DIAGNOSIS — I130 Hypertensive heart and chronic kidney disease with heart failure and stage 1 through stage 4 chronic kidney disease, or unspecified chronic kidney disease: Secondary | ICD-10-CM | POA: Diagnosis not present

## 2021-03-30 DIAGNOSIS — Z87440 Personal history of urinary (tract) infections: Secondary | ICD-10-CM | POA: Diagnosis not present

## 2021-03-30 DIAGNOSIS — Z79899 Other long term (current) drug therapy: Secondary | ICD-10-CM | POA: Diagnosis not present

## 2021-03-30 DIAGNOSIS — Z79891 Long term (current) use of opiate analgesic: Secondary | ICD-10-CM | POA: Diagnosis not present

## 2021-03-30 DIAGNOSIS — E039 Hypothyroidism, unspecified: Secondary | ICD-10-CM | POA: Diagnosis not present

## 2021-03-30 DIAGNOSIS — E46 Unspecified protein-calorie malnutrition: Secondary | ICD-10-CM | POA: Diagnosis not present

## 2021-03-30 DIAGNOSIS — Z7901 Long term (current) use of anticoagulants: Secondary | ICD-10-CM | POA: Diagnosis not present

## 2021-03-30 DIAGNOSIS — I5032 Chronic diastolic (congestive) heart failure: Secondary | ICD-10-CM | POA: Diagnosis not present

## 2021-03-30 DIAGNOSIS — Z9181 History of falling: Secondary | ICD-10-CM | POA: Diagnosis not present

## 2021-03-30 DIAGNOSIS — I2510 Atherosclerotic heart disease of native coronary artery without angina pectoris: Secondary | ICD-10-CM | POA: Diagnosis not present

## 2021-03-30 DIAGNOSIS — N183 Chronic kidney disease, stage 3 unspecified: Secondary | ICD-10-CM | POA: Diagnosis not present

## 2021-03-30 DIAGNOSIS — I739 Peripheral vascular disease, unspecified: Secondary | ICD-10-CM | POA: Diagnosis not present

## 2021-03-30 DIAGNOSIS — E785 Hyperlipidemia, unspecified: Secondary | ICD-10-CM | POA: Diagnosis not present

## 2021-03-30 DIAGNOSIS — I130 Hypertensive heart and chronic kidney disease with heart failure and stage 1 through stage 4 chronic kidney disease, or unspecified chronic kidney disease: Secondary | ICD-10-CM | POA: Diagnosis not present

## 2021-03-30 DIAGNOSIS — W19XXXD Unspecified fall, subsequent encounter: Secondary | ICD-10-CM | POA: Diagnosis not present

## 2021-03-30 DIAGNOSIS — K219 Gastro-esophageal reflux disease without esophagitis: Secondary | ICD-10-CM | POA: Diagnosis not present

## 2021-03-30 DIAGNOSIS — I480 Paroxysmal atrial fibrillation: Secondary | ICD-10-CM | POA: Diagnosis not present

## 2021-03-30 DIAGNOSIS — K589 Irritable bowel syndrome without diarrhea: Secondary | ICD-10-CM | POA: Diagnosis not present

## 2021-03-30 DIAGNOSIS — N3281 Overactive bladder: Secondary | ICD-10-CM | POA: Diagnosis not present

## 2021-03-30 DIAGNOSIS — S72142D Displaced intertrochanteric fracture of left femur, subsequent encounter for closed fracture with routine healing: Secondary | ICD-10-CM | POA: Diagnosis not present

## 2021-03-31 DIAGNOSIS — Z7901 Long term (current) use of anticoagulants: Secondary | ICD-10-CM | POA: Diagnosis not present

## 2021-03-31 DIAGNOSIS — Z79891 Long term (current) use of opiate analgesic: Secondary | ICD-10-CM | POA: Diagnosis not present

## 2021-03-31 DIAGNOSIS — I739 Peripheral vascular disease, unspecified: Secondary | ICD-10-CM | POA: Diagnosis not present

## 2021-03-31 DIAGNOSIS — K589 Irritable bowel syndrome without diarrhea: Secondary | ICD-10-CM | POA: Diagnosis not present

## 2021-03-31 DIAGNOSIS — I5032 Chronic diastolic (congestive) heart failure: Secondary | ICD-10-CM | POA: Diagnosis not present

## 2021-03-31 DIAGNOSIS — E46 Unspecified protein-calorie malnutrition: Secondary | ICD-10-CM | POA: Diagnosis not present

## 2021-03-31 DIAGNOSIS — I2510 Atherosclerotic heart disease of native coronary artery without angina pectoris: Secondary | ICD-10-CM | POA: Diagnosis not present

## 2021-03-31 DIAGNOSIS — W19XXXD Unspecified fall, subsequent encounter: Secondary | ICD-10-CM | POA: Diagnosis not present

## 2021-03-31 DIAGNOSIS — N3281 Overactive bladder: Secondary | ICD-10-CM | POA: Diagnosis not present

## 2021-03-31 DIAGNOSIS — K219 Gastro-esophageal reflux disease without esophagitis: Secondary | ICD-10-CM | POA: Diagnosis not present

## 2021-03-31 DIAGNOSIS — I130 Hypertensive heart and chronic kidney disease with heart failure and stage 1 through stage 4 chronic kidney disease, or unspecified chronic kidney disease: Secondary | ICD-10-CM | POA: Diagnosis not present

## 2021-03-31 DIAGNOSIS — Z9181 History of falling: Secondary | ICD-10-CM | POA: Diagnosis not present

## 2021-03-31 DIAGNOSIS — Z87440 Personal history of urinary (tract) infections: Secondary | ICD-10-CM | POA: Diagnosis not present

## 2021-03-31 DIAGNOSIS — S72142D Displaced intertrochanteric fracture of left femur, subsequent encounter for closed fracture with routine healing: Secondary | ICD-10-CM | POA: Diagnosis not present

## 2021-03-31 DIAGNOSIS — Z79899 Other long term (current) drug therapy: Secondary | ICD-10-CM | POA: Diagnosis not present

## 2021-03-31 DIAGNOSIS — E039 Hypothyroidism, unspecified: Secondary | ICD-10-CM | POA: Diagnosis not present

## 2021-03-31 DIAGNOSIS — E785 Hyperlipidemia, unspecified: Secondary | ICD-10-CM | POA: Diagnosis not present

## 2021-03-31 DIAGNOSIS — I480 Paroxysmal atrial fibrillation: Secondary | ICD-10-CM | POA: Diagnosis not present

## 2021-03-31 DIAGNOSIS — N183 Chronic kidney disease, stage 3 unspecified: Secondary | ICD-10-CM | POA: Diagnosis not present

## 2021-04-06 DIAGNOSIS — R404 Transient alteration of awareness: Secondary | ICD-10-CM | POA: Diagnosis not present

## 2021-04-06 DIAGNOSIS — R4020 Unspecified coma: Secondary | ICD-10-CM | POA: Diagnosis not present

## 2021-04-11 DIAGNOSIS — 419620001 Death: Secondary | SNOMED CT | POA: Diagnosis not present

## 2021-04-11 DEATH — deceased

## 2021-04-27 ENCOUNTER — Other Ambulatory Visit: Payer: Self-pay

## 2021-04-27 ENCOUNTER — Encounter: Payer: Self-pay | Admitting: Family Medicine
